# Patient Record
Sex: Male | Born: 1960 | Race: White | Hispanic: No | Marital: Single | Smoking: Current every day smoker
Health system: Southern US, Community
[De-identification: ages and names within clinical notes are randomized; demographics above are authoritative.]

## PROBLEM LIST (undated history)

## (undated) DIAGNOSIS — F419 Anxiety disorder, unspecified: Secondary | ICD-10-CM

## (undated) DIAGNOSIS — K219 Gastro-esophageal reflux disease without esophagitis: Secondary | ICD-10-CM

## (undated) DIAGNOSIS — M549 Dorsalgia, unspecified: Secondary | ICD-10-CM

---

## 2019-08-17 ENCOUNTER — Inpatient Hospital Stay (HOSPITAL_COMMUNITY)
Admission: EM | Admit: 2019-08-17 | Discharge: 2019-09-06 | DRG: 871 | Disposition: A | Discharge status: Home Health | Payer: Medicaid Other | Attending: Internal Medicine | Admitting: Internal Medicine

## 2019-08-17 ENCOUNTER — Emergency Department (HOSPITAL_COMMUNITY): Payer: Medicaid Other

## 2019-08-17 ENCOUNTER — Encounter (HOSPITAL_COMMUNITY): Payer: Self-pay | Admitting: Radiology

## 2019-08-17 DIAGNOSIS — E871 Hypo-osmolality and hyponatremia: Secondary | ICD-10-CM | POA: Diagnosis not present

## 2019-08-17 DIAGNOSIS — G8929 Other chronic pain: Secondary | ICD-10-CM | POA: Diagnosis present

## 2019-08-17 DIAGNOSIS — I10 Essential (primary) hypertension: Secondary | ICD-10-CM | POA: Diagnosis present

## 2019-08-17 DIAGNOSIS — R0602 Shortness of breath: Secondary | ICD-10-CM | POA: Diagnosis present

## 2019-08-17 DIAGNOSIS — Z20828 Contact with and (suspected) exposure to other viral communicable diseases: Secondary | ICD-10-CM | POA: Diagnosis present

## 2019-08-17 DIAGNOSIS — R188 Other ascites: Secondary | ICD-10-CM | POA: Diagnosis not present

## 2019-08-17 DIAGNOSIS — R161 Splenomegaly, not elsewhere classified: Secondary | ICD-10-CM | POA: Diagnosis not present

## 2019-08-17 DIAGNOSIS — R591 Generalized enlarged lymph nodes: Secondary | ICD-10-CM

## 2019-08-17 DIAGNOSIS — C9110 Chronic lymphocytic leukemia of B-cell type not having achieved remission: Secondary | ICD-10-CM | POA: Diagnosis present

## 2019-08-17 DIAGNOSIS — R579 Shock, unspecified: Secondary | ICD-10-CM

## 2019-08-17 DIAGNOSIS — L8990 Pressure ulcer of unspecified site, unspecified stage: Secondary | ICD-10-CM | POA: Insufficient documentation

## 2019-08-17 DIAGNOSIS — J189 Pneumonia, unspecified organism: Secondary | ICD-10-CM | POA: Diagnosis present

## 2019-08-17 DIAGNOSIS — D479 Neoplasm of uncertain behavior of lymphoid, hematopoietic and related tissue, unspecified: Secondary | ICD-10-CM | POA: Diagnosis not present

## 2019-08-17 DIAGNOSIS — R17 Unspecified jaundice: Secondary | ICD-10-CM | POA: Diagnosis present

## 2019-08-17 DIAGNOSIS — R7989 Other specified abnormal findings of blood chemistry: Secondary | ICD-10-CM | POA: Diagnosis not present

## 2019-08-17 DIAGNOSIS — R21 Rash and other nonspecific skin eruption: Secondary | ICD-10-CM | POA: Diagnosis not present

## 2019-08-17 DIAGNOSIS — C8308 Small cell B-cell lymphoma, lymph nodes of multiple sites: Secondary | ICD-10-CM

## 2019-08-17 DIAGNOSIS — E861 Hypovolemia: Secondary | ICD-10-CM | POA: Diagnosis present

## 2019-08-17 DIAGNOSIS — R6520 Severe sepsis without septic shock: Secondary | ICD-10-CM | POA: Diagnosis not present

## 2019-08-17 DIAGNOSIS — R590 Localized enlarged lymph nodes: Secondary | ICD-10-CM | POA: Diagnosis present

## 2019-08-17 DIAGNOSIS — R140 Abdominal distension (gaseous): Secondary | ICD-10-CM

## 2019-08-17 DIAGNOSIS — E43 Unspecified severe protein-calorie malnutrition: Secondary | ICD-10-CM | POA: Insufficient documentation

## 2019-08-17 DIAGNOSIS — F17210 Nicotine dependence, cigarettes, uncomplicated: Secondary | ICD-10-CM | POA: Diagnosis present

## 2019-08-17 DIAGNOSIS — R5381 Other malaise: Secondary | ICD-10-CM | POA: Diagnosis present

## 2019-08-17 DIAGNOSIS — E872 Acidosis: Secondary | ICD-10-CM | POA: Diagnosis present

## 2019-08-17 DIAGNOSIS — D735 Infarction of spleen: Secondary | ICD-10-CM | POA: Diagnosis present

## 2019-08-17 DIAGNOSIS — T782XXA Anaphylactic shock, unspecified, initial encounter: Secondary | ICD-10-CM | POA: Diagnosis not present

## 2019-08-17 DIAGNOSIS — R0600 Dyspnea, unspecified: Secondary | ICD-10-CM | POA: Diagnosis not present

## 2019-08-17 DIAGNOSIS — M549 Dorsalgia, unspecified: Secondary | ICD-10-CM | POA: Diagnosis present

## 2019-08-17 DIAGNOSIS — J90 Pleural effusion, not elsewhere classified: Secondary | ICD-10-CM

## 2019-08-17 DIAGNOSIS — R61 Generalized hyperhidrosis: Secondary | ICD-10-CM | POA: Diagnosis present

## 2019-08-17 DIAGNOSIS — E8809 Other disorders of plasma-protein metabolism, not elsewhere classified: Secondary | ICD-10-CM | POA: Diagnosis present

## 2019-08-17 DIAGNOSIS — J918 Pleural effusion in other conditions classified elsewhere: Secondary | ICD-10-CM | POA: Diagnosis present

## 2019-08-17 DIAGNOSIS — D630 Anemia in neoplastic disease: Secondary | ICD-10-CM | POA: Diagnosis present

## 2019-08-17 DIAGNOSIS — D61818 Other pancytopenia: Secondary | ICD-10-CM | POA: Diagnosis not present

## 2019-08-17 DIAGNOSIS — C8598 Non-Hodgkin lymphoma, unspecified, lymph nodes of multiple sites: Secondary | ICD-10-CM

## 2019-08-17 DIAGNOSIS — K219 Gastro-esophageal reflux disease without esophagitis: Secondary | ICD-10-CM | POA: Diagnosis present

## 2019-08-17 DIAGNOSIS — Z9889 Other specified postprocedural states: Secondary | ICD-10-CM

## 2019-08-17 DIAGNOSIS — J9601 Acute respiratory failure with hypoxia: Secondary | ICD-10-CM | POA: Diagnosis not present

## 2019-08-17 DIAGNOSIS — R945 Abnormal results of liver function studies: Secondary | ICD-10-CM | POA: Diagnosis present

## 2019-08-17 DIAGNOSIS — C8512 Unspecified B-cell lymphoma, intrathoracic lymph nodes: Secondary | ICD-10-CM | POA: Diagnosis present

## 2019-08-17 DIAGNOSIS — J9811 Atelectasis: Secondary | ICD-10-CM | POA: Diagnosis present

## 2019-08-17 DIAGNOSIS — R000 Tachycardia, unspecified: Secondary | ICD-10-CM | POA: Diagnosis present

## 2019-08-17 DIAGNOSIS — Z79899 Other long term (current) drug therapy: Secondary | ICD-10-CM

## 2019-08-17 DIAGNOSIS — E0781 Sick-euthyroid syndrome: Secondary | ICD-10-CM | POA: Diagnosis not present

## 2019-08-17 DIAGNOSIS — R079 Chest pain, unspecified: Secondary | ICD-10-CM

## 2019-08-17 DIAGNOSIS — R162 Hepatomegaly with splenomegaly, not elsewhere classified: Secondary | ICD-10-CM | POA: Diagnosis present

## 2019-08-17 DIAGNOSIS — C801 Malignant (primary) neoplasm, unspecified: Secondary | ICD-10-CM | POA: Diagnosis not present

## 2019-08-17 DIAGNOSIS — A419 Sepsis, unspecified organism: Secondary | ICD-10-CM | POA: Diagnosis not present

## 2019-08-17 DIAGNOSIS — Y92230 Patient room in hospital as the place of occurrence of the external cause: Secondary | ICD-10-CM | POA: Diagnosis not present

## 2019-08-17 DIAGNOSIS — E860 Dehydration: Secondary | ICD-10-CM | POA: Diagnosis present

## 2019-08-17 DIAGNOSIS — F419 Anxiety disorder, unspecified: Secondary | ICD-10-CM | POA: Diagnosis present

## 2019-08-17 DIAGNOSIS — Z807 Family history of other malignant neoplasms of lymphoid, hematopoietic and related tissues: Secondary | ICD-10-CM

## 2019-08-17 DIAGNOSIS — D500 Iron deficiency anemia secondary to blood loss (chronic): Secondary | ICD-10-CM | POA: Diagnosis present

## 2019-08-17 DIAGNOSIS — Z6821 Body mass index (BMI) 21.0-21.9, adult: Secondary | ICD-10-CM

## 2019-08-17 DIAGNOSIS — R64 Cachexia: Secondary | ICD-10-CM | POA: Diagnosis present

## 2019-08-17 DIAGNOSIS — T451X5A Adverse effect of antineoplastic and immunosuppressive drugs, initial encounter: Secondary | ICD-10-CM | POA: Diagnosis not present

## 2019-08-17 DIAGNOSIS — D649 Anemia, unspecified: Secondary | ICD-10-CM

## 2019-08-17 DIAGNOSIS — E876 Hypokalemia: Secondary | ICD-10-CM | POA: Diagnosis not present

## 2019-08-17 DIAGNOSIS — R109 Unspecified abdominal pain: Secondary | ICD-10-CM

## 2019-08-17 DIAGNOSIS — C8590 Non-Hodgkin lymphoma, unspecified, unspecified site: Secondary | ICD-10-CM

## 2019-08-17 DIAGNOSIS — Z590 Homelessness: Secondary | ICD-10-CM

## 2019-08-17 HISTORY — DX: Anxiety disorder, unspecified: F41.9

## 2019-08-17 HISTORY — DX: Gastro-esophageal reflux disease without esophagitis: K21.9

## 2019-08-17 HISTORY — DX: Dorsalgia, unspecified: M54.9

## 2019-08-17 LAB — CBC WITH DIFFERENTIAL/PLATELET
Abs Immature Granulocytes: 0.03 10*3/uL (ref 0.00–0.07)
Basophils Absolute: 0.1 10*3/uL (ref 0.0–0.1)
Basophils Relative: 2 %
Eosinophils Absolute: 0 10*3/uL (ref 0.0–0.5)
Eosinophils Relative: 0 %
HCT: 14.3 % — ABNORMAL LOW (ref 39.0–52.0)
Hemoglobin: 4.6 g/dL — CL (ref 13.0–17.0)
Immature Granulocytes: 1 %
Lymphocytes Relative: 64 %
Lymphs Abs: 2.2 10*3/uL (ref 0.7–4.0)
MCH: 27.4 pg (ref 26.0–34.0)
MCHC: 32.2 g/dL (ref 30.0–36.0)
MCV: 85.1 fL (ref 80.0–100.0)
Monocytes Absolute: 0.3 10*3/uL (ref 0.1–1.0)
Monocytes Relative: 8 %
Neutro Abs: 0.9 10*3/uL — ABNORMAL LOW (ref 1.7–7.7)
Neutrophils Relative %: 25 %
Platelets: 39 10*3/uL — ABNORMAL LOW (ref 150–400)
RBC: 1.68 MIL/uL — ABNORMAL LOW (ref 4.22–5.81)
RDW: 26.6 % — ABNORMAL HIGH (ref 11.5–15.5)
WBC: 3.4 10*3/uL — ABNORMAL LOW (ref 4.0–10.5)
nRBC: 0 % (ref 0.0–0.2)

## 2019-08-17 LAB — BASIC METABOLIC PANEL
Anion gap: 15 (ref 5–15)
BUN: 10 mg/dL (ref 6–20)
CO2: 19 mmol/L — ABNORMAL LOW (ref 22–32)
Calcium: 8.6 mg/dL — ABNORMAL LOW (ref 8.9–10.3)
Chloride: 92 mmol/L — ABNORMAL LOW (ref 98–111)
Creatinine, Ser: 0.52 mg/dL — ABNORMAL LOW (ref 0.61–1.24)
GFR calc Af Amer: >60 mL/min (ref >60)
GFR calc non Af Amer: >60 mL/min (ref >60)
Glucose, Bld: 78 mg/dL (ref 70–99)
Potassium: 4.1 mmol/L (ref 3.5–5.1)
Sodium: 126 mmol/L — ABNORMAL LOW (ref 135–145)

## 2019-08-17 LAB — URINALYSIS, ROUTINE W REFLEX MICROSCOPIC
Bilirubin Urine: NEGATIVE
Glucose, UA: NEGATIVE mg/dL
Hgb urine dipstick: NEGATIVE
Ketones, ur: NEGATIVE mg/dL
Leukocytes,Ua: NEGATIVE
Nitrite: NEGATIVE
Protein, ur: NEGATIVE mg/dL
Specific Gravity, Urine: 1.016 (ref 1.005–1.030)
pH: 5 (ref 5.0–8.0)

## 2019-08-17 LAB — BRAIN NATRIURETIC PEPTIDE: B Natriuretic Peptide: 93.7 pg/mL (ref 0.0–100.0)

## 2019-08-17 LAB — TROPONIN I (HIGH SENSITIVITY)
Troponin I (High Sensitivity): 22 ng/L — ABNORMAL HIGH (ref <18)
Troponin I (High Sensitivity): 36 ng/L — ABNORMAL HIGH (ref <18)

## 2019-08-17 LAB — LACTIC ACID, PLASMA
Lactic Acid, Venous: 6.4 mmol/L (ref 0.5–1.9)
Lactic Acid, Venous: 6.8 mmol/L (ref 0.5–1.9)

## 2019-08-17 LAB — PREPARE RBC (CROSSMATCH)

## 2019-08-17 LAB — POC OCCULT BLOOD, ED: Fecal Occult Bld: NEGATIVE

## 2019-08-17 LAB — D-DIMER, QUANTITATIVE: D-Dimer, Quant: 5.16 ug/mL-FEU — ABNORMAL HIGH (ref 0.00–0.50)

## 2019-08-17 MED ORDER — SODIUM CHLORIDE 0.9 % IV BOLUS (SEPSIS)
1000.0000 mL | Freq: Once | INTRAVENOUS | Status: AC
  Administered 2019-08-17: 1000 mL via INTRAVENOUS

## 2019-08-17 MED ORDER — PIPERACILLIN-TAZOBACTAM 3.375 G IVPB
3.3750 g | Freq: Three times a day (TID) | INTRAVENOUS | Status: AC
  Administered 2019-08-18 (×2): 3.375 g via INTRAVENOUS
  Filled 2019-08-17 (×2): qty 50

## 2019-08-17 MED ORDER — IOHEXOL 350 MG/ML SOLN
100.0000 mL | Freq: Once | INTRAVENOUS | Status: AC | PRN
  Administered 2019-08-17: 100 mL via INTRAVENOUS

## 2019-08-17 MED ORDER — PANTOPRAZOLE SODIUM 20 MG PO TBEC
20.0000 mg | DELAYED_RELEASE_TABLET | Freq: Every day | ORAL | Status: DC
  Administered 2019-08-18 – 2019-09-06 (×20): 20 mg via ORAL
  Filled 2019-08-17 (×20): qty 1

## 2019-08-17 MED ORDER — PIPERACILLIN-TAZOBACTAM 3.375 G IVPB 30 MIN
3.3750 g | Freq: Once | INTRAVENOUS | Status: AC
  Administered 2019-08-18: 3.375 g via INTRAVENOUS
  Filled 2019-08-17: qty 50

## 2019-08-17 MED ORDER — NICOTINE 21 MG/24HR TD PT24: 21.0000 mg | MEDICATED_PATCH | Freq: Every day | TRANSDERMAL | Status: DC

## 2019-08-17 MED ORDER — VANCOMYCIN HCL 10 G IV SOLR
1500.0000 mg | Freq: Once | INTRAVENOUS | Status: AC
  Administered 2019-08-18: 1500 mg via INTRAVENOUS
  Filled 2019-08-17 (×2): qty 1500

## 2019-08-17 MED ORDER — ACETAMINOPHEN 650 MG RE SUPP: 650.0000 mg | Freq: Four times a day (QID) | RECTAL | Status: DC | PRN

## 2019-08-17 MED ORDER — ACETAMINOPHEN 325 MG PO TABS
650.0000 mg | ORAL_TABLET | Freq: Four times a day (QID) | ORAL | Status: DC | PRN
  Administered 2019-08-18 – 2019-09-05 (×15): 650 mg via ORAL
  Filled 2019-08-17 (×16): qty 2

## 2019-08-17 MED ORDER — SODIUM CHLORIDE (PF) 0.9 % IJ SOLN
INTRAMUSCULAR | Status: AC
  Filled 2019-08-17: qty 50

## 2019-08-17 MED ORDER — SODIUM CHLORIDE 0.9 % IV BOLUS
500.0000 mL | Freq: Once | INTRAVENOUS | Status: AC
  Administered 2019-08-17: 500 mL via INTRAVENOUS

## 2019-08-17 MED ORDER — SODIUM CHLORIDE 0.9% IV SOLUTION
Freq: Once | INTRAVENOUS | Status: AC
  Administered 2019-08-17: 23:00:00 via INTRAVENOUS

## 2019-08-17 MED ORDER — NICOTINE 7 MG/24HR TD PT24
7.0000 mg | MEDICATED_PATCH | Freq: Every day | TRANSDERMAL | Status: DC
  Administered 2019-08-18 – 2019-09-06 (×20): 7 mg via TRANSDERMAL
  Filled 2019-08-17 (×21): qty 1

## 2019-08-17 MED ORDER — HYDROXYZINE HCL 10 MG PO TABS
10.0000 mg | ORAL_TABLET | Freq: Three times a day (TID) | ORAL | Status: DC | PRN
  Administered 2019-08-18 – 2019-08-30 (×18): 10 mg via ORAL
  Filled 2019-08-17 (×23): qty 1

## 2019-08-17 NOTE — ED Provider Notes (Signed)
[facility] DEPT Provider Note   CSN: [id number] Arrival date & time: 08/17/19  1754     History   Chief Complaint No chief complaint on file.   HPI Isaac Pierce is a 58 y.o. male with no documented PMHx who presents to the ED today complaining of gradual onset, constant, shortness of breath x 4-5 weeks.  Reports that prior to 4 to 5 weeks ago he felt well and was able to get up without difficulty and without feeling short of breath.  He does report that he was recently homeless until a couple of weeks ago.  He states that he has not been taking anything for his symptoms.  He denies any history of congestive heart failure but reports that EMS told him it appears that he is in congestive heart failure.  States that his sister is a [employer] scientist and was concerned for him and told him to call EMS for further evaluation.  Patient does not currently have a PCP.  Patient denies fever, chills, chest pain, cough, abdominal pain, nausea, vomiting, blood in stool, urinary symptoms, any other associated symptoms.      History reviewed. No pertinent past medical history.  Patient Active Problem List   Diagnosis Date Noted   Malignancy ([place]) 08/17/2019        Home Medications    Prior to Admission medications   Medication Sig Start Date End Date Taking? Authorizing Provider  hydrOXYzine (ATARAX/VISTARIL) 10 MG tablet Take 10 mg by mouth 3 (three) times daily as needed. 05/31/19  Yes [provider]  ibuprofen (ADVIL) 200 MG tablet Take 200 mg by mouth every 6 (six) hours as needed for moderate pain.   Yes [provider]  omeprazole (PRILOSEC) 20 MG capsule Take 20 mg by mouth daily. 07/23/19  Yes [provider]    Family History No family history on file.  Social History Social History   Tobacco Use   Smoking status: Not on file  Substance Use Topics   Alcohol use: Not on file   Drug use: Not on file     Allergies   Patient  has no known allergies.   Review of Systems Review of Systems  Constitutional: Positive for appetite change and fatigue. Negative for chills and fever.  HENT: Negative for congestion.   Respiratory: Positive for shortness of breath.   Cardiovascular: Positive for leg swelling. Negative for chest pain.  Gastrointestinal: Positive for abdominal distention. Negative for nausea and vomiting.  Genitourinary: Negative for difficulty urinating.  Musculoskeletal: Negative for myalgias.  Skin: Negative for rash.  Neurological: Negative for headaches.     Physical Exam Updated Vital Signs BP 107/70 (BP Location: Right Arm)    Pulse (!) 117    Temp 98.1 F (36.7 C)    Resp 16    SpO2 100%   Physical Exam Vitals signs and nursing note reviewed.  Constitutional:      Appearance: He is not ill-appearing or diaphoretic.  HENT:     Head: Normocephalic and atraumatic.  Eyes:     Conjunctiva/sclera: Conjunctivae normal.  Neck:     Musculoskeletal: Neck supple.  Cardiovascular:     Rate and Rhythm: Regular rhythm. Tachycardia present.     Pulses: Normal pulses.  Pulmonary:     Effort: Pulmonary effort is normal.     Breath sounds: Normal breath sounds. No wheezing, rhonchi or rales.     Comments: Slight increased work of breathing without accessory muscle use. Satting 100%  on RA Abdominal:     Palpations: Abdomen is soft.     Tenderness: There is no abdominal tenderness. There is no guarding or rebound.  Genitourinary:    Comments: Chaperone present for rectal exam. External hemorrhoids appreciated; non thrombosed. Good rectal tone. Guaiac negative.  Musculoskeletal:     Right lower leg: Edema present.     Left lower leg: Edema present.     Comments: 2+ pitting edema bilaterally   Skin:    General: Skin is warm and dry.  Neurological:     Mental Status: He is alert.      ED Treatments / Results  Labs (all labs ordered are listed, but only abnormal results are displayed) Labs  Reviewed  BASIC METABOLIC PANEL - Abnormal; Notable for the following components:      Result Value   Sodium 126 (*)    Chloride 92 (*)    CO2 19 (*)    Creatinine, Ser 0.52 (*)    Calcium 8.6 (*)    All other components within normal limits  CBC WITH DIFFERENTIAL/PLATELET - Abnormal; Notable for the following components:   WBC 3.4 (*)    RBC 1.68 (*)    Hemoglobin 4.6 (*)    HCT 14.3 (*)    RDW 26.6 (*)    Platelets 39 (*)    Neutro Abs 0.9 (*)    All other components within normal limits  LACTIC ACID, PLASMA - Abnormal; Notable for the following components:   Lactic Acid, Venous 6.4 (*)    All other components within normal limits  D-DIMER, QUANTITATIVE (NOT AT [facility]) - Abnormal; Notable for the following components:   D-Dimer, Quant 5.16 (*)    All other components within normal limits  TROPONIN I (HIGH SENSITIVITY) - Abnormal; Notable for the following components:   Troponin I (High Sensitivity) 22 (*)    All other components within normal limits  SARS CORONAVIRUS 2 (TAT 6-24 HRS)  CULTURE, BLOOD (ROUTINE X 2)  CULTURE, BLOOD (ROUTINE X 2)  BRAIN NATRIURETIC PEPTIDE  URINALYSIS, ROUTINE W REFLEX MICROSCOPIC  LACTIC ACID, PLASMA  PATHOLOGIST SMEAR REVIEW  OCCULT BLOOD X 1 CARD TO LAB, STOOL  POC OCCULT BLOOD, ED  TYPE AND SCREEN  PREPARE RBC (CROSSMATCH)  TROPONIN I (HIGH SENSITIVITY)    EKG None  Radiology Dg Chest 2 View  Result Date: 08/17/2019 CLINICAL DATA:  Shortness of breath EXAM: CHEST - 2 VIEW COMPARISON:  None. FINDINGS: Small bilateral pleural effusions. Mild basilar airspace disease. Borderline heart size with mild central vascular congestion. No pneumothorax. IMPRESSION: Mild central vascular congestion with small bilateral effusions. Atelectasis or mild pneumonia at the left greater than right lung base. Electronically Signed   By: Donavan Foil M.D.   On: 08/17/2019 19:57   Ct Angio Chest Pe W/cm &/or Wo Cm  Result Date: 08/17/2019 CLINICAL DATA:   Shortness of breath for 1 month EXAM: CT ANGIOGRAPHY CHEST WITH CONTRAST TECHNIQUE: Multidetector CT imaging of the chest was performed using the standard protocol during bolus administration of intravenous contrast. Multiplanar CT image reconstructions and MIPs were obtained to evaluate the vascular anatomy. CONTRAST:  133mL OMNIPAQUE IOHEXOL 350 MG/ML SOLN COMPARISON:  Chest x-ray 08/17/2019 FINDINGS: Cardiovascular: Suboptimal contrast bolus timing and respiratory motion artifact degraded examination of the more distal pulmonary arterial tree. No filling defect within the main or lobar branch pulmonary arteries. No evidence of right heart strain. Heart size is normal. There is pericardial thickening. Thoracic aorta is normal in course  and caliber. Mediastinum/Nodes: Bulky mediastinal, bilateral hilar, and bilateral axillary lymphadenopathy. Thyroid, trachea, and esophagus are grossly unremarkable. Lungs/Pleura: Small bilateral pleural effusions, left greater than right with associated compressive atelectasis. There are numerous bilateral noncalcified pulmonary nodules. Reference nodules include 10 mm right lower lobe juxtapleural nodule (series 6, image 88). 9 mm medial right lower lobe nodule (series 6, image 106). 7 mm anterior right middle lobe nodule (series 6, image 107). 7 mm left lower lobe nodule (series 6, image 90). 8 mm lingular nodule (series 6, image 92). No pneumothorax. Upper Abdomen: Hepatosplenomegaly within the visualized upper abdomen. Ill-defined area of low density within the peripheral aspect of the spleen measuring approximately 3.7 x 2.8 cm (series 4, image 135). Musculoskeletal: No chest wall abnormality. No acute or significant osseous findings. Review of the MIP images confirms the above findings. IMPRESSION: 1. Limited exam. No filling defect to the lobar branch level to suggest pulmonary embolism. 2. Bulky mediastinal, bilateral hilar, and bilateral axillary lymphadenopathy. Findings  concerning for primary lymphoproliferative process such as lymphoma. Metastatic disease secondary to unknown primary is also a consideration. 3. Multiple bilateral noncalcified pulmonary nodules measuring up to 10 mm, likely a result of the same process as listed above. 4. Small bilateral pleural effusions, left greater than right with associated compressive atelectasis. 5. Hepatosplenomegaly within the visualized upper abdomen with ill-defined area of low density within the peripheral aspect of the spleen measuring 3.7 x 2.8 cm. Findings are concerning for lymphomatous involvement versus metastatic disease. Alternatively, sequela of splenic injury could have a similar appearance. These results were called by telephone at the time of interpretation on 08/17/2019 at 8:49 pm to provider Graylee Arutyunyan , who verbally acknowledged these results. Electronically Signed   By: Davina Poke M.D.   On: 08/17/2019 20:51    Procedures .Critical Care Performed by: Eustaquio Maize, PA-C Authorized by: Eustaquio Maize, PA-C   Critical care provider statement:    Critical care time (minutes):  45   Critical care was time spent personally by me on the following activities:  Discussions with consultants, evaluation of patient's response to treatment, examination of patient, ordering and performing treatments and interventions, ordering and review of laboratory studies, ordering and review of radiographic studies, pulse oximetry, re-evaluation of patient's condition, obtaining history from patient or surrogate and review of old charts   (including critical care time)  Medications Ordered in ED Medications  sodium chloride (PF) 0.9 % injection (has no administration in time range)  0.9 %  sodium chloride infusion (Manually program via Guardrails IV Fluids) (has no administration in time range)  iohexol (OMNIPAQUE) 350 MG/ML injection 100 mL (100 mLs Intravenous Contrast Given 08/17/19 2026)  sodium chloride 0.9 %  bolus 1,000 mL (1,000 mLs Intravenous New Bag/Given 08/17/19 2106)     Initial Impression / Assessment and Plan / ED Course  I have reviewed the triage vital signs and the nursing notes.  Pertinent labs & imaging results that were available during my care of the patient were reviewed by me and considered in my medical decision making (see chart for details).  58 year old male who presents to the ED complaining of shortness of breath for several weeks.  Patient does appear to be getting slightly more to breathe although his lungs are clear to auscultation bilaterally.  He is satting 100% on room air.  Patient is noted to be significantly tachycardic on exam in the 1 teens.  He is denying any chest pain at this time.  He is  afebrile.  Doubt infection today although will work-up for this.  We will hold off on antibiotics at this time.  Concerned for congestive heart failure exacerbation versus PE.  Obtain D-dimer as well as BNP, chest x-ray, troponin, screening labs at this time.   D-dimer significantly elevated at 5.16.  Will proceed with CTA.   CBC with pancytopenia; blood cell count 3.4, red blood cell count 1.68, hemoglobin 4.6, hematocrit 14.3, platelets 39,000.  Will transfuse at this time.  Fecal occult blood test - negative.   Lactic acid elevated at 6.4; will give 1 L NS bolus. Again doubt infectious etiology; cxr without infection as well as urinalysis. Pt's only complaint is shortness of breath and WBC not elevated; suspect elevated due to significant hypovolemia/inability to profuse organs adequately. Do not want to overload patient with fluids given bilateral pleural effusions and complaint of SOB.   CTA with concern for lymphoma to multiple sites; consistent with pt's pancytopenia today. Will consult oncology for further reccs as well as hospitalist for admission.   Clinical Course as of Aug 17 2135  Tue Aug 17, 2019  2050 Received call from radiologist - no obvious PE. Wide spread  lymphoma. Hepatosplenomegaly. Low density lesion in spleen. Pulmonary nodules bilaterally. B/l pleural effusions.    [MV]  2135 Discussed case with Dr. Julien Nordmann with oncology who will see patient in the morning.    [MV]  2136 Discussed case with hospitalist Dr. Marlowe Sax who agrees to see patient for admission.    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

## 2019-08-17 NOTE — H&P (Signed)
History and Physical    Isaac Pierce [id number] DOB: 09-19-1961 DOA: 08/17/2019  PCP: Patient, No Pcp Per Patient coming from: Home  Chief Complaint: Shortness of breath  HPI: Isaac Pierce is a 58 y.o. male with a past medical history of chronic back pain, GERD, anxiety, homelessness presenting with a chief complaint of shortness of breath.  Patient states he returned here from [place] about 5 weeks ago and since then has been doing very poorly.  He has no energy and stays in bed all the time.  He has no appetite.  He is feeling short of breath even at rest and his breathing has been getting progressively worse.  He sometimes has a cough productive of white sputum.  He has lost a significant amount of weight.  His legs have been very swollen and his abdomen is also swollen.  He has noticed lumps underneath his axilla and on both sides of his neck.  He wakes up at night drenched in sweat.  States both his parents died from [place].  Denies personal history of malignancy.  He was previously smoking 1 to 2 packs of cigarettes per day but since he has been sick is now only smoking 1 to 4 cigarettes/day.  ED Course: Tachycardic with heart rate in the 110s to 120s.  Tachypneic with respiratory rate in the 20s.  Blood pressure intermittently soft and patient received 1 L normal saline bolus.  Oxygen saturation 96-100% on room air.  Placed on 2 L supplemental oxygen via nasal cannula for increased work of breathing.  Labs showing pancytopenia (WBC count 3.4, hemoglobin 4.6, platelet count 39,000).  FOBT negative.  Other lab abnormalities seen:  Lactic acid 6.4.  Sodium 126.  Bicarb 19, anion gap 15.  BNP normal.  D-dimer elevated at 5.16.  High-sensitivity troponin mildly elevated at 22.  EKG not suggestive of ACS. Pending labs: Pathologist smear review, blood culture x2, SARS-CoV-2 test Chest x-ray showing mild central vascular congestion with small bilateral effusions.  Atelectasis or mild pneumonia  at the left greater than right lung base. Chest CT angiogram (limited exam) negative for PE.  Showing bulky mediastinal, bilateral hilar, and bilateral axillary lymphadenopathy.  Findings concerning for primary lymphoproliferative process such as lymphoma.  Metastatic disease secondary to unknown primary is also a consideration.  Multiple bilateral noncalcified pulmonary nodules measuring up to 10 mm.  Small bilateral pleural effusions, left greater than right with associated compressive atelectasis.  Hepatosplenomegaly within the visualized upper abdomen with ill-defined area of low density within the peripheral aspect of the spleen measuring 3.7 x 2.8 cm, findings concerning for lymphomatous involvement versus metastatic disease versus possible sequela of splenic injury. 2 units PRBCs ordered. ED provider discussed the case with Dr. Julien Nordmann from oncology, will consult in a.m.  Review of Systems:  All systems reviewed and apart from history of presenting illness, are negative.  Past Medical History:  Diagnosis Date   Anxiety    Back pain    GERD (gastroesophageal reflux disease)     History reviewed. No pertinent surgical history.   reports that he has been smoking cigarettes. He has never used smokeless tobacco. He reports previous alcohol use. He reports previous drug use.  No Known Allergies  Family History  Problem Relation Age of Onset   Lymphoma Mother    Lymphoma Father     Prior to Admission medications   Not on File    Physical Exam: Vitals:   08/17/19 2226 08/17/19 2232 08/17/19 2249 08/17/19  2307  BP: (!) 93/46 (!) 96/38 (!) 101/45 (!) 114/48  Pulse: (!) 125  (!) 123 (!) 123  Resp: 19  (!) 26 (!) 24  Temp:   98.1 F (36.7 C) 98.2 F (36.8 C)  TempSrc:   Oral Oral  SpO2: 97%  100% 99%  Weight:        Physical Exam  Constitutional: He is oriented to person, place, and time. No distress.  Cachectic  HENT:  Head: Normocephalic.  Dry mucous membranes    Eyes: Right eye exhibits no discharge. Left eye exhibits no discharge.  Neck: Neck supple.  Bilateral submental and cervical lymphadenopathy  Cardiovascular: Regular rhythm and intact distal pulses.  Tachycardic  Pulmonary/Chest: Effort normal and breath sounds normal. No respiratory distress. He has no wheezes. He has no rales.  Abdominal: Soft. Bowel sounds are normal. He exhibits distension and mass. There is no abdominal tenderness. There is no guarding.  Musculoskeletal:        General: Edema present.     Comments: Significant +3 pitting edema of bilateral lower extremities Bilateral axillary lymphadenopathy  Neurological: He is alert and oriented to person, place, and time.  Skin: Skin is warm and dry. He is not diaphoretic.     Labs on Admission: I have personally reviewed following labs and imaging studies  CBC: Recent Labs  Lab 08/17/19 1914  WBC 3.4*  NEUTROABS 0.9*  HGB 4.6*  HCT 14.3*  MCV 85.1  PLT 39*   Basic Metabolic Panel: Recent Labs  Lab 08/17/19 1914  NA 126*  K 4.1  CL 92*  CO2 19*  GLUCOSE 78  BUN 10  CREATININE 0.52*  CALCIUM 8.6*   GFR: CrCl cannot be calculated (Unknown ideal weight.). Liver Function Tests: No results for input(s): AST, ALT, ALKPHOS, BILITOT, PROT, ALBUMIN in the last 168 hours. No results for input(s): LIPASE, AMYLASE in the last 168 hours. No results for input(s): AMMONIA in the last 168 hours. Coagulation Profile: No results for input(s): INR, PROTIME in the last 168 hours. Cardiac Enzymes: No results for input(s): CKTOTAL, CKMB, CKMBINDEX, TROPONINI in the last 168 hours. BNP (last 3 results) No results for input(s): PROBNP in the last 8760 hours. HbA1C: No results for input(s): HGBA1C in the last 72 hours. CBG: No results for input(s): GLUCAP in the last 168 hours. Lipid Profile: No results for input(s): CHOL, HDL, LDLCALC, TRIG, CHOLHDL, LDLDIRECT in the last 72 hours. Thyroid Function Tests: No results for  input(s): TSH, T4TOTAL, FREET4, T3FREE, THYROIDAB in the last 72 hours. Anemia Panel: No results for input(s): VITAMINB12, FOLATE, FERRITIN, TIBC, IRON, RETICCTPCT in the last 72 hours. Urine analysis:    Component Value Date/Time   COLORURINE YELLOW 08/17/2019 1914   APPEARANCEUR CLEAR 08/17/2019 1914   LABSPEC 1.016 08/17/2019 1914   PHURINE 5.0 08/17/2019 1914   GLUCOSEU NEGATIVE 08/17/2019 [place] NEGATIVE 08/17/2019 [place] NEGATIVE 08/17/2019 [place] NEGATIVE 08/17/2019 1914   PROTEINUR NEGATIVE 08/17/2019 1914   NITRITE NEGATIVE 08/17/2019 1914   LEUKOCYTESUR NEGATIVE 08/17/2019 1914    Radiological Exams on Admission: Dg Chest 2 View  Result Date: 08/17/2019 CLINICAL DATA:  Shortness of breath EXAM: CHEST - 2 VIEW COMPARISON:  None. FINDINGS: Small bilateral pleural effusions. Mild basilar airspace disease. Borderline heart size with mild central vascular congestion. No pneumothorax. IMPRESSION: Mild central vascular congestion with small bilateral effusions. Atelectasis or mild pneumonia at the left greater than right lung base. Electronically Signed  By: Donavan Foil M.D.   On: 08/17/2019 19:57   Ct Angio Chest Pe W/cm &/or Wo Cm  Result Date: 08/17/2019 CLINICAL DATA:  Shortness of breath for 1 month EXAM: CT ANGIOGRAPHY CHEST WITH CONTRAST TECHNIQUE: Multidetector CT imaging of the chest was performed using the standard protocol during bolus administration of intravenous contrast. Multiplanar CT image reconstructions and MIPs were obtained to evaluate the vascular anatomy. CONTRAST:  170mL OMNIPAQUE IOHEXOL 350 MG/ML SOLN COMPARISON:  Chest x-ray 08/17/2019 FINDINGS: Cardiovascular: Suboptimal contrast bolus timing and respiratory motion artifact degraded examination of the more distal pulmonary arterial tree. No filling defect within the main or lobar branch pulmonary arteries. No evidence of right heart strain. Heart size is normal. There is  pericardial thickening. Thoracic aorta is normal in course and caliber. Mediastinum/Nodes: Bulky mediastinal, bilateral hilar, and bilateral axillary lymphadenopathy. Thyroid, trachea, and esophagus are grossly unremarkable. Lungs/Pleura: Small bilateral pleural effusions, left greater than right with associated compressive atelectasis. There are numerous bilateral noncalcified pulmonary nodules. Reference nodules include 10 mm right lower lobe juxtapleural nodule (series 6, image 88). 9 mm medial right lower lobe nodule (series 6, image 106). 7 mm anterior right middle lobe nodule (series 6, image 107). 7 mm left lower lobe nodule (series 6, image 90). 8 mm lingular nodule (series 6, image 92). No pneumothorax. Upper Abdomen: Hepatosplenomegaly within the visualized upper abdomen. Ill-defined area of low density within the peripheral aspect of the spleen measuring approximately 3.7 x 2.8 cm (series 4, image 135). Musculoskeletal: No chest wall abnormality. No acute or significant osseous findings. Review of the MIP images confirms the above findings. IMPRESSION: 1. Limited exam. No filling defect to the lobar branch level to suggest pulmonary embolism. 2. Bulky mediastinal, bilateral hilar, and bilateral axillary lymphadenopathy. Findings concerning for primary lymphoproliferative process such as lymphoma. Metastatic disease secondary to unknown primary is also a consideration. 3. Multiple bilateral noncalcified pulmonary nodules measuring up to 10 mm, likely a result of the same process as listed above. 4. Small bilateral pleural effusions, left greater than right with associated compressive atelectasis. 5. Hepatosplenomegaly within the visualized upper abdomen with ill-defined area of low density within the peripheral aspect of the spleen measuring 3.7 x 2.8 cm. Findings are concerning for lymphomatous involvement versus metastatic disease. Alternatively, sequela of splenic injury could have a similar appearance.  These results were called by telephone at the time of interpretation on 08/17/2019 at 8:49 pm to provider MARGAUX VENTER , who verbally acknowledged these results. Electronically Signed   By: Davina Poke M.D.   On: 08/17/2019 20:51    EKG: Independently reviewed.  Sinus tachycardia, heart rate 121.  Assessment/Plan Principal Problem:   Severe sepsis (HCC) Active Problems:   Malignancy (HCC)   Pancytopenia (HCC)   Dyspnea   Pleural effusion   Hyponatremia   Lactic acidosis/ concern for severe sepsis from unknown source Lactic acid significantly elevated at 6.4.  Although this could be related to underlying malignancy, there remains concern for severe sepsis given tachycardia, tachypnea, and soft blood pressure readings.  No fever.  Does have lymphopenia.  Chest CT not suggestive of pneumonia.  UA not suggestive of infection. -Received 1 L normal saline bolus.  Continue to monitor blood pressure closely and give additional fluid boluses to keep MAP >65. -Give broad-spectrum antibiotics including vancomycin and Zosyn -Check procalcitonin level -Trend lactate -Blood culture x2 pending -Urine culture -SARS-CoV-2 test pending  Concern for malignancy/ lymphoproliferative disorder Patient is presenting with a 5-week history of  dyspnea, night sweats, anorexia, significant weight loss, and diffuse lymphadenopathy. Chest CT angiogram showing bulky mediastinal, bilateral hilar, and bilateral axillary lymphadenopathy.  Findings concerning for primary lymphoproliferative process such as lymphoma.  Metastatic disease secondary to unknown primary is also consideration.  Also showing multiple bilateral noncalcified pulmonary nodules measuring up to 10 mm.  Hepatosplenomegaly within the visualized upper abdomen with ill-defined area of low density within the peripheral aspect of the spleen measuring 3.7 x 2.8 cm, findings concerning for lymphomatous involvement versus metastatic disease versus possible  sequela of splenic injury. -ED provider discussed the case with Dr. Julien Nordmann from oncology, will consult in a.m. -CT abdomen pelvis for further staging  Pancytopenia (leukopenia, severe anemia, and severe thrombocytopenia) Suspect related to malignancy.  WBC count 3.4, hemoglobin 4.6, platelet count 39,000.  Blood loss anemia from acute GI bleed less likely given negative FOBT. -Oncology will consult in a.m. -Pathologist smear review -Continue to monitor CBC -Anemia panel -2 units PRBCs for symptomatic anemia -Avoid anticoagulation for DVT prophylaxis  Dyspnea secondary to suspected malignant pleural effusions and compressive atelectasis CT showing small bilateral pleural effusions, left greater than right with associated compressive atelectasis.  Although patient does have significant peripheral edema, no overt pulmonary edema reported on CT.  BNP normal.  Not hypoxic.  Placed on 2 L supplemental oxygen for increased work of breathing. -Avoid giving Lasix at this time given soft blood pressure readings.  Systolic is currently in the 90s. -Continue supplemental oxygen to keep oxygen saturation above 94% -Continuous pulse ox  Hypovolemic hyponatremia Patient appears dehydrated.  Endorses decreased p.o. intake for over a month.  Sodium 126. -IV fluid hydration -Continue to monitor BMP -Check serum osmolarity  Normal anion gap metabolic acidosis Bicarb 19, anion gap 15.  Patient is not on a diuretic and not endorsing diarrhea. -IV fluid hydration -Continue to monitor BMP  Bilateral lower extremity edema/ concern for DVT Patient has significant bilateral lower extremity edema.  D-dimer elevated at 5.16.  CT angiogram negative for PE.  There remains concern for DVT given imaging findings consistent with malignancy. -Bilateral lower extremity Dopplers.  If positive for DVT, will likely need IVC filter as patient will be a poor candidate for anticoagulation given severe  thrombocytopenia.  Troponinemia High-sensitivity troponin mildly elevated at 22.  EKG not suggestive of ACS.  Patient is not complaining of chest pain and appears comfortable. -Cardiac monitoring -Trend troponin  Tobacco use -NicoDerm patch -Counseling  Anxiety -Continue home hydroxyzine  Chronic back pain -Tylenol as needed  GERD -Continue PPI  Homelessness -Social work consult  HIV screening The patient falls between the ages of 13-64 and should be screened for HIV, therefore HIV testing ordered.  DVT prophylaxis: SCDs given thrombocytopenia Code Status: Full code Family Communication: No family available. Disposition Plan: Anticipate discharge after clinical improvement. Consults called: Oncology Admission status: It is my clinical opinion that admission to INPATIENT is reasonable and necessary in this 58 y.o. male  presenting dyspnea/increased work of breathing requiring supplemental oxygen.  Imaging with findings concerning for lymphoproliferative disorder/malignancy.  There is also concern for severe sepsis.  Labs suggestive of pancytopenia.  Receiving broad-spectrum antibiotics, blood transfusion, and IV fluid.  Oncology will consult in a.m. high risk of decompensation.  Monitor closely in the stepdown unit.  Given the aforementioned, the predictability of an adverse outcome is felt to be significant. I expect that the patient will require at least 2 midnights in the hospital to treat this condition.   The medical decision  making on this patient was of high complexity and the patient is at high risk for clinical deterioration, therefore this is a level 3 visit.  Shela Leff MD [facility] Pager [phone]  If 7PM-7AM, please contact night-coverage [email or web address] Password [email or web address]  08/17/2019, 11:09 PM

## 2019-08-17 NOTE — ED Notes (Signed)
Patient given two sandwhiches, cheese sticks, and a sprite per request

## 2019-08-17 NOTE — ED Triage Notes (Signed)
Per EMS-states SOB for 1 month, LE edema/ascites for 1 week-weakness-patient has been homeless up until 1 month ago-JVD and pitting edema

## 2019-08-17 NOTE — ED Notes (Signed)
Patient placed on 2L oxygen per Dr. Stark Jock. Patient having difficulty breathing while speaking.

## 2019-08-18 ENCOUNTER — Inpatient Hospital Stay (HOSPITAL_COMMUNITY): Payer: Medicaid Other

## 2019-08-18 ENCOUNTER — Encounter (HOSPITAL_COMMUNITY): Payer: Self-pay | Admitting: Oncology

## 2019-08-18 ENCOUNTER — Other Ambulatory Visit: Payer: Self-pay

## 2019-08-18 DIAGNOSIS — R6520 Severe sepsis without septic shock: Secondary | ICD-10-CM

## 2019-08-18 DIAGNOSIS — A419 Sepsis, unspecified organism: Principal | ICD-10-CM

## 2019-08-18 DIAGNOSIS — R0600 Dyspnea, unspecified: Secondary | ICD-10-CM

## 2019-08-18 DIAGNOSIS — E871 Hypo-osmolality and hyponatremia: Secondary | ICD-10-CM

## 2019-08-18 DIAGNOSIS — R591 Generalized enlarged lymph nodes: Secondary | ICD-10-CM

## 2019-08-18 DIAGNOSIS — J90 Pleural effusion, not elsewhere classified: Secondary | ICD-10-CM

## 2019-08-18 DIAGNOSIS — R0602 Shortness of breath: Secondary | ICD-10-CM

## 2019-08-18 DIAGNOSIS — C801 Malignant (primary) neoplasm, unspecified: Secondary | ICD-10-CM

## 2019-08-18 DIAGNOSIS — R7989 Other specified abnormal findings of blood chemistry: Secondary | ICD-10-CM

## 2019-08-18 LAB — CBC
HCT: 21.3 % — ABNORMAL LOW (ref 39.0–52.0)
Hemoglobin: 7 g/dL — ABNORMAL LOW (ref 13.0–17.0)
MCH: 28 pg (ref 26.0–34.0)
MCHC: 32.9 g/dL (ref 30.0–36.0)
MCV: 85.2 fL (ref 80.0–100.0)
Platelets: 42 10*3/uL — ABNORMAL LOW (ref 150–400)
RBC: 2.5 MIL/uL — ABNORMAL LOW (ref 4.22–5.81)
RDW: 23.9 % — ABNORMAL HIGH (ref 11.5–15.5)
WBC: 3.8 10*3/uL — ABNORMAL LOW (ref 4.0–10.5)
nRBC: 0 % (ref 0.0–0.2)

## 2019-08-18 LAB — ABO/RH: ABO/RH(D): O POS

## 2019-08-18 LAB — COMPREHENSIVE METABOLIC PANEL
ALT: 25 U/L (ref 0–44)
AST: 67 U/L — ABNORMAL HIGH (ref 15–41)
Albumin: 2 g/dL — ABNORMAL LOW (ref 3.5–5.0)
Alkaline Phosphatase: 171 U/L — ABNORMAL HIGH (ref 38–126)
Anion gap: 11 (ref 5–15)
BUN: 11 mg/dL (ref 6–20)
CO2: 21 mmol/L — ABNORMAL LOW (ref 22–32)
Calcium: 8.6 mg/dL — ABNORMAL LOW (ref 8.9–10.3)
Chloride: 96 mmol/L — ABNORMAL LOW (ref 98–111)
Creatinine, Ser: 0.53 mg/dL — ABNORMAL LOW (ref 0.61–1.24)
GFR calc Af Amer: >60 mL/min (ref >60)
GFR calc non Af Amer: >60 mL/min (ref >60)
Glucose, Bld: 89 mg/dL (ref 70–99)
Potassium: 3.9 mmol/L (ref 3.5–5.1)
Sodium: 128 mmol/L — ABNORMAL LOW (ref 135–145)
Total Bilirubin: 1.4 mg/dL — ABNORMAL HIGH (ref 0.3–1.2)
Total Protein: 4.6 g/dL — ABNORMAL LOW (ref 6.5–8.1)

## 2019-08-18 LAB — ECHOCARDIOGRAM COMPLETE
Height: 71 in
Weight: 2553.81 oz

## 2019-08-18 LAB — PATHOLOGIST SMEAR REVIEW

## 2019-08-18 LAB — RAPID URINE DRUG SCREEN, HOSP PERFORMED
Amphetamines: NOT DETECTED
Barbiturates: NOT DETECTED
Benzodiazepines: NOT DETECTED
Cocaine: NOT DETECTED
Opiates: NOT DETECTED
Tetrahydrocannabinol: NOT DETECTED

## 2019-08-18 LAB — MRSA PCR SCREENING: MRSA by PCR: NEGATIVE

## 2019-08-18 LAB — RETICULOCYTES
Immature Retic Fract: 24.7 % — ABNORMAL HIGH (ref 2.3–15.9)
RBC.: 2.5 MIL/uL — ABNORMAL LOW (ref 4.22–5.81)
Retic Count, Absolute: 50 10*3/uL (ref 19.0–186.0)
Retic Ct Pct: 2 % (ref 0.4–3.1)

## 2019-08-18 LAB — LACTIC ACID, PLASMA
Lactic Acid, Venous: 6 mmol/L (ref 0.5–1.9)
Lactic Acid, Venous: 4.6 mmol/L (ref 0.5–1.9)
Lactic Acid, Venous: 3 mmol/L (ref 0.5–1.9)
Lactic Acid, Venous: 2.9 mmol/L (ref 0.5–1.9)

## 2019-08-18 LAB — PROTIME-INR
INR: 1.3 — ABNORMAL HIGH (ref 0.8–1.2)
Prothrombin Time: 15.7 seconds — ABNORMAL HIGH (ref 11.4–15.2)

## 2019-08-18 LAB — DIFFERENTIAL
Abs Immature Granulocytes: 0.05 10*3/uL (ref 0.00–0.07)
Basophils Absolute: 0 10*3/uL (ref 0.0–0.1)
Basophils Relative: 1 %
Eosinophils Absolute: 0 10*3/uL (ref 0.0–0.5)
Eosinophils Relative: 0 %
Immature Granulocytes: 1 %
Lymphocytes Relative: 53 %
Lymphs Abs: 2.1 10*3/uL (ref 0.7–4.0)
Monocytes Absolute: 0.4 10*3/uL (ref 0.1–1.0)
Monocytes Relative: 12 %
Neutro Abs: 1.3 10*3/uL — ABNORMAL LOW (ref 1.7–7.7)
Neutrophils Relative %: 33 %

## 2019-08-18 LAB — VITAMIN B12: Vitamin B-12: 1022 pg/mL — ABNORMAL HIGH (ref 180–914)

## 2019-08-18 LAB — MAGNESIUM: Magnesium: 1.5 mg/dL — ABNORMAL LOW (ref 1.7–2.4)

## 2019-08-18 LAB — PHOSPHORUS: Phosphorus: 3.4 mg/dL (ref 2.5–4.6)

## 2019-08-18 LAB — HEPATIC FUNCTION PANEL
ALT: 24 U/L (ref 0–44)
AST: 69 U/L — ABNORMAL HIGH (ref 15–41)
Albumin: 2 g/dL — ABNORMAL LOW (ref 3.5–5.0)
Alkaline Phosphatase: 160 U/L — ABNORMAL HIGH (ref 38–126)
Bilirubin, Direct: 0.5 mg/dL — ABNORMAL HIGH (ref 0.0–0.2)
Indirect Bilirubin: 0.9 mg/dL (ref 0.3–0.9)
Total Bilirubin: 1.4 mg/dL — ABNORMAL HIGH (ref 0.3–1.2)
Total Protein: 4.4 g/dL — ABNORMAL LOW (ref 6.5–8.1)

## 2019-08-18 LAB — IRON AND TIBC
Iron: 87 ug/dL (ref 45–182)
Saturation Ratios: 41 % — ABNORMAL HIGH (ref 17.9–39.5)
TIBC: 213 ug/dL — ABNORMAL LOW (ref 250–450)
UIBC: 126 ug/dL

## 2019-08-18 LAB — HEMOGLOBIN AND HEMATOCRIT, BLOOD
HCT: 22.5 % — ABNORMAL LOW (ref 39.0–52.0)
Hemoglobin: 7.5 g/dL — ABNORMAL LOW (ref 13.0–17.0)

## 2019-08-18 LAB — OSMOLALITY, URINE: Osmolality, Ur: 439 mOsm/kg (ref 300–900)

## 2019-08-18 LAB — CREATININE, URINE, RANDOM: Creatinine, Urine: 53.24 mg/dL

## 2019-08-18 LAB — TROPONIN I (HIGH SENSITIVITY)
Troponin I (High Sensitivity): 127 ng/L (ref <18)
Troponin I (High Sensitivity): 128 ng/L (ref <18)

## 2019-08-18 LAB — FOLATE: Folate: 5.4 ng/mL — ABNORMAL LOW (ref >5.9)

## 2019-08-18 LAB — LACTATE DEHYDROGENASE: LDH: 688 U/L — ABNORMAL HIGH (ref 98–192)

## 2019-08-18 LAB — PROCALCITONIN: Procalcitonin: 2.6 ng/mL

## 2019-08-18 LAB — PREPARE RBC (CROSSMATCH)

## 2019-08-18 LAB — SODIUM, URINE, RANDOM: Sodium, Ur: 74 mmol/L

## 2019-08-18 LAB — FERRITIN: Ferritin: 1444 ng/mL — ABNORMAL HIGH (ref 24–336)

## 2019-08-18 LAB — SARS CORONAVIRUS 2 (TAT 6-24 HRS): SARS Coronavirus 2: NEGATIVE

## 2019-08-18 LAB — BRAIN NATRIURETIC PEPTIDE: B Natriuretic Peptide: 91.8 pg/mL (ref 0.0–100.0)

## 2019-08-18 LAB — OSMOLALITY: Osmolality: 271 mOsm/kg — ABNORMAL LOW (ref 275–295)

## 2019-08-18 MED ORDER — LIDOCAINE-EPINEPHRINE (PF) 2 %-1:200000 IJ SOLN
INTRAMUSCULAR | Status: AC
  Filled 2019-08-18: qty 20

## 2019-08-18 MED ORDER — SODIUM CHLORIDE (PF) 0.9 % IJ SOLN
INTRAMUSCULAR | Status: AC
  Administered 2019-08-18: 14:00:00
  Filled 2019-08-18: qty 50

## 2019-08-18 MED ORDER — IOHEXOL 9 MG/ML PO SOLN
ORAL | Status: AC
  Filled 2019-08-18: qty 1000

## 2019-08-18 MED ORDER — IPRATROPIUM BROMIDE 0.02 % IN SOLN
0.5000 mg | Freq: Four times a day (QID) | RESPIRATORY_TRACT | Status: DC
  Administered 2019-08-18: 0.5 mg via RESPIRATORY_TRACT
  Filled 2019-08-18: qty 2.5

## 2019-08-18 MED ORDER — LEVALBUTEROL HCL 0.63 MG/3ML IN NEBU
0.6300 mg | INHALATION_SOLUTION | Freq: Four times a day (QID) | RESPIRATORY_TRACT | Status: DC
  Administered 2019-08-18: 0.63 mg via RESPIRATORY_TRACT
  Filled 2019-08-18: qty 3

## 2019-08-18 MED ORDER — FUROSEMIDE 10 MG/ML IJ SOLN
40.0000 mg | Freq: Once | INTRAMUSCULAR | Status: AC
  Administered 2019-08-18: 40 mg via INTRAVENOUS
  Filled 2019-08-18: qty 4

## 2019-08-18 MED ORDER — IPRATROPIUM BROMIDE 0.02 % IN SOLN
0.5000 mg | Freq: Two times a day (BID) | RESPIRATORY_TRACT | Status: DC
  Administered 2019-08-18 – 2019-08-28 (×20): 0.5 mg via RESPIRATORY_TRACT
  Filled 2019-08-18 (×20): qty 2.5

## 2019-08-18 MED ORDER — SODIUM CHLORIDE 0.9 % IV BOLUS: 500.0000 mL | Freq: Once | INTRAVENOUS | Status: DC

## 2019-08-18 MED ORDER — ENSURE ENLIVE PO LIQD
237.0000 mL | Freq: Two times a day (BID) | ORAL | Status: DC
  Administered 2019-08-18 – 2019-09-06 (×16): 237 mL via ORAL

## 2019-08-18 MED ORDER — FUROSEMIDE 10 MG/ML IJ SOLN
20.0000 mg | Freq: Once | INTRAMUSCULAR | Status: AC
  Administered 2019-08-18: 20 mg via INTRAVENOUS
  Filled 2019-08-18: qty 2

## 2019-08-18 MED ORDER — SODIUM CHLORIDE 0.9 % IV SOLN: INTRAVENOUS | Status: DC

## 2019-08-18 MED ORDER — ADULT MULTIVITAMIN W/MINERALS CH
1.0000 | ORAL_TABLET | Freq: Every day | ORAL | Status: DC
  Administered 2019-08-18 – 2019-09-06 (×20): 1 via ORAL
  Filled 2019-08-18 (×20): qty 1

## 2019-08-18 MED ORDER — ORAL CARE MOUTH RINSE
15.0000 mL | Freq: Two times a day (BID) | OROMUCOSAL | Status: DC
  Administered 2019-08-18 – 2019-09-06 (×26): 15 mL via OROMUCOSAL

## 2019-08-18 MED ORDER — FOLIC ACID 1 MG PO TABS
1.0000 mg | ORAL_TABLET | Freq: Every day | ORAL | Status: DC
  Administered 2019-08-18 – 2019-09-06 (×20): 1 mg via ORAL
  Filled 2019-08-18 (×20): qty 1

## 2019-08-18 MED ORDER — LEVALBUTEROL HCL 0.63 MG/3ML IN NEBU
0.6300 mg | INHALATION_SOLUTION | Freq: Four times a day (QID) | RESPIRATORY_TRACT | Status: DC | PRN
  Administered 2019-08-20 – 2019-09-05 (×7): 0.63 mg via RESPIRATORY_TRACT
  Filled 2019-08-18 (×7): qty 3

## 2019-08-18 MED ORDER — LORAZEPAM 2 MG/ML IJ SOLN
1.0000 mg | Freq: Once | INTRAMUSCULAR | Status: AC
  Administered 2019-08-18: 1 mg via INTRAVENOUS
  Filled 2019-08-18: qty 1

## 2019-08-18 MED ORDER — SODIUM CHLORIDE 0.9 % IV SOLN
2.0000 g | Freq: Three times a day (TID) | INTRAVENOUS | Status: AC
  Administered 2019-08-18 – 2019-08-23 (×16): 2 g via INTRAVENOUS
  Filled 2019-08-18 (×16): qty 2

## 2019-08-18 MED ORDER — HYDROCORTISONE 1 % EX CREA
TOPICAL_CREAM | Freq: Two times a day (BID) | CUTANEOUS | Status: DC
  Administered 2019-08-18 (×3): via TOPICAL
  Administered 2019-08-19 – 2019-08-20 (×3): 1 via TOPICAL
  Filled 2019-08-18: qty 28

## 2019-08-18 MED ORDER — SODIUM CHLORIDE 0.9% IV SOLUTION
Freq: Once | INTRAVENOUS | Status: AC
  Administered 2019-08-18: 13:00:00 via INTRAVENOUS

## 2019-08-18 MED ORDER — PRO-STAT SUGAR FREE PO LIQD
30.0000 mL | Freq: Two times a day (BID) | ORAL | Status: DC
  Administered 2019-08-18 – 2019-09-03 (×24): 30 mL via ORAL
  Filled 2019-08-18 (×32): qty 30

## 2019-08-18 MED ORDER — IOHEXOL 300 MG/ML  SOLN
100.0000 mL | Freq: Once | INTRAMUSCULAR | Status: AC | PRN
  Administered 2019-08-18: 100 mL via INTRAVENOUS

## 2019-08-18 MED ORDER — CHLORHEXIDINE GLUCONATE CLOTH 2 % EX PADS
6.0000 | MEDICATED_PAD | Freq: Every day | CUTANEOUS | Status: DC
  Administered 2019-08-19 – 2019-08-21 (×3): 6 via TOPICAL

## 2019-08-18 MED ORDER — IOHEXOL 9 MG/ML PO SOLN
1000.0000 mL | ORAL | Status: AC
  Administered 2019-08-18: 1000 mL via ORAL

## 2019-08-18 MED ORDER — LEVALBUTEROL HCL 0.63 MG/3ML IN NEBU
0.6300 mg | INHALATION_SOLUTION | Freq: Two times a day (BID) | RESPIRATORY_TRACT | Status: DC
  Administered 2019-08-18 – 2019-08-28 (×20): 0.63 mg via RESPIRATORY_TRACT
  Filled 2019-08-18 (×20): qty 3

## 2019-08-18 MED ORDER — MAGNESIUM SULFATE 2 GM/50ML IV SOLN
2.0000 g | Freq: Once | INTRAVENOUS | Status: AC
  Administered 2019-08-18: 2 g via INTRAVENOUS
  Filled 2019-08-18: qty 50

## 2019-08-18 MED ORDER — VANCOMYCIN HCL 10 G IV SOLR
1250.0000 mg | Freq: Two times a day (BID) | INTRAVENOUS | Status: DC
  Administered 2019-08-18 – 2019-08-19 (×2): 1250 mg via INTRAVENOUS
  Filled 2019-08-18 (×3): qty 1250

## 2019-08-18 NOTE — Progress Notes (Signed)
Pharmacy Antibiotic Note  Isaac Pierce is a 58 y.o. male admitted on 08/17/2019 with sepsis.  Pharmacy has been consulted for Vancomycin, zosyn dosing.  Plan: Vancomycin 1.5gm iv x1, then Vancomycin 1250 mg IV Q 12 hrs. Goal AUC 400-550. Expected AUC: 502 SCr used: 0.8 (adjusted)  Zosyn 3.375gm iv x1, then Zosyn 3.375g IV Q8H infused over 4hrs.    Height: 5\' 11"  (180.3 cm) Weight: 159 lb 9.8 oz (72.4 kg) IBW/kg (Calculated) : 75.3  Temp (24hrs), Avg:98.3 F (36.8 C), Min:98.1 F (36.7 C), Max:98.7 F (37.1 C)  Recent Labs  Lab 08/17/19 1914 08/17/19 2052 08/18/19 0134 08/18/19 0437  WBC 3.4*  --   --   --   CREATININE 0.52*  --   --   --   LATICACIDVEN 6.4* 6.8* 6.0* 4.6*    Estimated Creatinine Clearance: 105.6 mL/min (A) (by C-G formula based on SCr of 0.52 mg/dL (L)).    No Known Allergies  Antimicrobials this admission: Vancomycin 08/17/2019 >> Zosyn 08/17/2019 >>   Dose adjustments this admission: -  Microbiology results: -  Thank you for allowing pharmacy to be a part of this patient's care.  Nani Skillern Crowford 08/18/2019 6:21 AM

## 2019-08-18 NOTE — Progress Notes (Signed)
  Echocardiogram 2D Echocardiogram has been performed.  Jennette Dubin 08/18/2019, 2:10 PM

## 2019-08-18 NOTE — Progress Notes (Signed)
PHARMACY - PHYSICIAN COMMUNICATION CRITICAL VALUE ALERT - BLOOD CULTURE IDENTIFICATION (BCID)  Isaac Pierce is an 58 y.o. male who presented to [facility] on 08/17/2019 with a chief complaint of shortness of breath.   Assessment: Concern for severe sepsis from unknown source. CT angiogram of the chest negative for PE but showed bulky mediastinal, bilateral hilar, and bilateral axillary lymphadenopathy which is concerning for a primary lymphoproliferative process such as lymphoma versus metastatic disease from an unknown primary. Aerobic bottle of 1 set of blood cultures with gram positive cocci. Per microbiology lab, due to shortage of reagent, BCID not being run on gram positive organisms in 1 set of blood cultures.   Name of physician (or Provider) Contacted: Dr. Alfredia Ferguson  Current antibiotics: Vancomycin and Zosyn  Changes to prescribed antibiotics recommended:  Per MD, change Zosyn to Cefepime to decrease risk of nephrotoxicity. Will start Cefepime 2g IV q8h. Continue Vancomycin per MD.    Lindell Spar M 08/18/2019  3:26 PM

## 2019-08-18 NOTE — Progress Notes (Signed)
PROGRESS NOTE    Isaac Pierce  UEK:[id number] DOB: [birth year] DOA: 08/17/2019 PCP: Patient, No Pcp Per   Brief Narrative:  HPI per Dr. Shela Leff on 08/17/2019 Isaac Pierce is a 58 y.o. male with a past medical history of chronic back pain, GERD, anxiety, homelessness presenting with a chief complaint of shortness of breath.  Patient states he returned here from [place] about 5 weeks ago and since then has been doing very poorly.  He has no energy and stays in bed all the time.  He has no appetite.  He is feeling short of breath even at rest and his breathing has been getting progressively worse.  He sometimes has a cough productive of white sputum.  He has lost a significant amount of weight.  His legs have been very swollen and his abdomen is also swollen.  He has noticed lumps underneath his axilla and on both sides of his neck.  He wakes up at night drenched in sweat.  States both his parents died from [place].  Denies personal history of malignancy.  He was previously smoking 1 to 2 packs of cigarettes per day but since he has been sick is now only smoking 1 to 4 cigarettes/day.  ED Course: Tachycardic with heart rate in the 110s to 120s.  Tachypneic with respiratory rate in the 20s.  Blood pressure intermittently soft and patient received 1 L normal saline bolus.  Oxygen saturation 96-100% on room air.  Placed on 2 L supplemental oxygen via nasal cannula for increased work of breathing.  Labs showing pancytopenia (WBC count 3.4, hemoglobin 4.6, platelet count 39,000).  FOBT negative.  Other lab abnormalities seen:  Lactic acid 6.4.  Sodium 126.  Bicarb 19, anion gap 15.  BNP normal.  D-dimer elevated at 5.16.  High-sensitivity troponin mildly elevated at 22.  EKG not suggestive of ACS. Pending labs: Pathologist smear review, blood culture x2, SARS-CoV-2 test Chest x-ray showing mild central vascular congestion with small bilateral effusions.  Atelectasis or mild pneumonia at the left  greater than right lung base. Chest CT angiogram (limited exam) negative for PE.  Showing bulky mediastinal, bilateral hilar, and bilateral axillary lymphadenopathy.  Findings concerning for primary lymphoproliferative process such as lymphoma.  Metastatic disease secondary to unknown primary is also a consideration.  Multiple bilateral noncalcified pulmonary nodules measuring up to 10 mm.  Small bilateral pleural effusions, left greater than right with associated compressive atelectasis.  Hepatosplenomegaly within the visualized upper abdomen with ill-defined area of low density within the peripheral aspect of the spleen measuring 3.7 x 2.8 cm, findings concerning for lymphomatous involvement versus metastatic disease versus possible sequela of splenic injury. 2 units PRBCs ordered. ED provider discussed the case with Dr. Julien Nordmann from oncology, will consult in a.m.  **Interim History And severe respiratory distress this morning was given IV Lasix with improvement.  Pulmonary is consulted for further evaluation recommendations and medical oncology recommending biopsy and he underwent a lymph node biopsy today of the dominant right inguinal lymph node.  Patient blood count improved slightly after 2 years will be given an additional 1 unit  Assessment & Plan:   Principal Problem:   Severe sepsis ([place]) Active Problems:   Malignancy ([place])   Pancytopenia (HCC)   Dyspnea   Pleural effusion   Hyponatremia  Lactic Acidosis/ Concern for severe sepsis from unknown source and ? Bacteremia -Lactic acid wassignificantly elevated at 6.4.   -Although this could be related to underlying malignancy, there remains concern for  severe sepsis given tachycardia, tachypnea, and soft blood pressure readings.  No fever.  Does have lymphopenia.  Chest CTA not suggestive of pneumonia and is negative for PE.  UA not suggestive of infection. -Received 1 L normal saline bolus and was given more fluid and no is KVO'd     -Continue to monitor blood pressure closely and will not give additional fluid boluses to keep MAP >65 given severe Volume Overload. -Give broad-spectrum antibiotics including vancomycin and Zosyn but will change to IV Cefepime -Checked procalcitonin level and was 2.60 -Trending Lactic Acid and trended down from 6.8 -> 2.9 -Blood culture x2 pending aerobic set of 1 blood cultures gram-positive cocci could be contaminant -Urine culture Pending as well -SARS-CoV-2 test Negative  -Repeat CXR this AM showed "Stable cardiomediastinal silhouette. No pneumothorax is noted. Mild bibasilar atelectasis or edema is noted with probable small pleural effusions. Bony thorax is unremarkable." -Change IV Zosyn to IV cefepime and will continue IV vancomycin as well  Concern for malignancy/ lymphoproliferative disorder -Patient is presenting with a 5-week history of dyspnea, night sweats, anorexia, significant weight loss, and diffuse lymphadenopathy.  -Chest CT angiogram showing bulky mediastinal, bilateral hilar, and bilateral axillary lymphadenopathy.  Findings concerning for primary lymphoproliferative process such as lymphoma.  Metastatic disease secondary to unknown primary is also consideration.  Also showing multiple bilateral noncalcified pulmonary nodules measuring up to 10 mm.  Hepatosplenomegaly within the visualized upper abdomen with ill-defined area of low density within the peripheral aspect of the spleen measuring 3.7 x 2.8 cm, findings concerning for lymphomatous involvement versus metastatic disease versus possible sequela of splenic injury. -ED provider discussed the case with Dr. Julien Nordmann from oncology, will consult in a.m. -CT abdomen pelvis for further staging and will be done in AM -Since concerning for lymphoma versus metastatic disease from an unknown primary and patient is to undergo an ultrasound-guided lymph node biopsy by IR and had a technically successful ultrasound-guided biopsy of  the dominant right inguinal lymph node but they are also considering obtaining a bone marrow biopsy this admission -Pulmonary was also consulted and recommending biopsy and they doubt that he needs mediastinoscopy  Pancytopenia (leukopenia, severe anemia, and severe thrombocytopenia) -Suspect related to malignancy or lymphoproliferative disorder.  WBC count 3.4, hemoglobin 4.6, platelet count 39,000.  Blood loss anemia from acute GI bleed less likely given negative FOBT. -Anemia panel done and showed an iron level of 87, TIBC 126, TIBC 230, saturation ratios of 41%, ferritin of 1444, folate of 5.4, and vitamin B12 level 1022 -Patient's hemoglobin/hematocrit went from 4.6/14.3 and is now 7.0/12.3 after 2 units and will transfuse 1 more unit PRBCs -Patient is platelet count is now 42,000 and WBC is 3.8 -Oncology consulted and recommending PLT transfusions for hemoglobin less than 8 and recommended platelet transfusion of platelets drop below 20,000 or active bleeding -Pathologist smear review ordered  -Medical oncology is also started the patient on folic acid 1 mg p.o. 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   -Although patient does have significant peripheral edema, no overt pulmonary edema reported on CT but repeat  .   -BNP normal at 93.7 and 91.8   -Was not initially hypoxic but was Placed on 2 L supplemental oxygen for increased work of breathing and  now on 3 Liters -BP improved and given IV Lasix -Continuous pulse oximetry and maintain O2 saturation greater than 94% -Continue supplemental oxygen via nasal cannula wean O2 as tolerated and currently was on 3  L  Hyponatremia -Initially thought to be from Hypovolemia but patient had significant JVD and LE Edema -Stopped IVF and started Diuresis and given IV Lasix 40 mg this AM and another 20 mg after 3rd unit of Blood  -Initially Patient may have appeared dehydrated as he Endorsed decreased p.o. intake for over a month.  -Sodium 126 on admission and repeat this AM was 128. -IV fluid hydration now KVO'd  -Continue to monitor CMP in AM  -Check serum osmolarity  Normal anion gap metabolic acidosis -Bicarb 19, anion gap 15 on admission and repeat showed an anion gap of 11, CO2 21, and a chloride of 100.  Patient is not on a diuretic and not endorsing diarrhea. -IV fluid hydration as above and is now KVO to -Continue to monitor CMP  Bilateral lower extremity edema/ concern for DVT -Patient has significant bilateral lower extremity edema.   -D-dimer elevated at 5.16.  CT angiogram negative for PE.  There remains concern for DVT given imaging findings consistent with malignancy. -Bilateral lower extremity Dopplers no evidence of the DVT in the right left lower extremity and there is no cystic structures found in popliteal fossa of both extremities  Hypomagnesemia -Patient magnesium level this morning is 1.5 -Replete with IV mag sulfate 2 g -Continue to monitor replete as necessary -Repeat magnesium level in a.m.  Troponinemia High-sensitivity troponin mildly elevated at 22.   -EKG not suggestive of ACS.  Patient is not complaining of chest pain and appears comfortable. -Cardiac monitoring -Trend troponin and a trended up to 128  Tobacco Use -Smoking cessation counseling given and was started on a nicotine patch  Volume Overload -In the setting possible CHF versus hypoalbuminemia -BNP was normal -We will check cardiac echocardiogram and he was given IV Lasix today -Currently -2.689 L since Lasix Administration and a total of -1.184 L since admission -Continue monitor signs and symptoms  of volume overload and strict I's and O's and daily weights   Anxiety -Continue home hydroxyzine  Chronic back pain -Tylenol as needed  GERD -Continue PPI with Pantoprazole 20 mg Daily   Homelessness -Social work consulted for further assistance   HIV screening The patient falls between the ages of 13-64 and should be screened for HIV, therefore HIV testing ordered and is pending  Hyperbilirubinemia and abnormal LFTs -AST was 67 ALT was 69 and T bili was 1.4 -Continue to monitor and trend and check acute hepatitis panel and right upper quadrant ultrasound -Repeat CMP in a.m.  DVT prophylaxis: SCDs Code Status: FULL CODE Family Communication: No family present at bedside  Disposition Plan: Pending further clinical improvement   Consultants:   Medical Oncology  Pulmonary   Procedures:  ECHOCARDIOGRAM IMPRESSIONS    1. Left ventricular ejection fraction, by visual estimation, is 60 to 65%. The left ventricle has normal function. There is no left ventricular hypertrophy.  2. Indeterminate diastolic filling due to E-A fusion.  3. The left ventricle has no regional wall motion abnormalities.  4. Global right ventricle has normal systolic function.The right ventricular size is normal. No increase in right ventricular wall thickness.  5. Left atrial size was mildly dilated.  6. Right atrial size was normal.  7. Trivial pericardial effusion is present.  8. The mitral valve is normal in structure. No  evidence of mitral valve regurgitation. No evidence of mitral stenosis.  9. The tricuspid valve is normal in structure. Tricuspid valve regurgitation is trivial. 10. The aortic valve is tricuspid. Aortic valve regurgitation is not visualized. No evidence of aortic valve sclerosis or stenosis. 11. TR signal is inadequate for assessing pulmonary artery systolic pressure. 12. The inferior vena cava is normal in size with greater than 50% respiratory variability, suggesting  right atrial pressure of 3 mmHg.  FINDINGS  Left Ventricle: Left ventricular ejection fraction, by visual estimation, is 60 to 65%. The left ventricle has normal function. The left ventricle has no regional wall motion abnormalities. The left ventricular internal cavity size was the left  ventricle is normal in size. There is no left ventricular hypertrophy. Indeterminate diastolic filling due to E-A fusion.  Right Ventricle: The right ventricular size is normal. No increase in right ventricular wall thickness. Global RV systolic function is has normal systolic function.  Left Atrium: Left atrial size was mildly dilated.  Right Atrium: Right atrial size was normal in size  Pericardium: Trivial pericardial effusion is present.  Mitral Valve: The mitral valve is normal in structure. No evidence of mitral valve stenosis by observation. No evidence of mitral valve regurgitation.  Tricuspid Valve: The tricuspid valve is normal in structure. Tricuspid valve regurgitation is trivial.  Aortic Valve: The aortic valve is tricuspid. Aortic valve regurgitation is not visualized. The aortic valve is structurally normal, with no evidence of sclerosis or stenosis.  Pulmonic Valve: The pulmonic valve was normal in structure. Pulmonic valve regurgitation is not visualized.  Aorta: The aortic root is normal in size and structure.  Venous: The inferior vena cava is normal in size with greater than 50% respiratory variability, suggesting right atrial pressure of 3 mmHg.  IAS/Shunts: No atrial level shunt detected by color flow Doppler.     LEFT VENTRICLE PLAX 2D LVIDd:         5.50 cm Diastology LVIDs:         3.40 cm LV e' lateral: 16.20 cm/s LV PW:         0.90 cm LV e' medial:  10.40 cm/s LV IVS:        0.90 cm LV SV:         100 ml LV SV Index:   52.54    RIGHT VENTRICLE RV S prime:     23.40 cm/s TAPSE (M-mode): 2.7 cm  LEFT ATRIUM             Index LA diam:        3.80  cm 1.98 cm/m LA Vol (A2C):   87.1 ml 45.46 ml/m LA Vol (A4C):   61.0 ml 31.84 ml/m LA Biplane Vol: 72.9 ml 38.05 ml/m  AORTIC VALVE LVOT Vmax:   149.00 cm/s LVOT Vmean:  102.000 cm/s LVOT VTI:    0.266 m   AORTA Ao Root diam: 3.10 cm    SHUNTS Systemic VTI: 0.27 m   Antimicrobials:  Anti-infectives (From admission, onward)   Start     Dose/Rate Route Frequency Ordered Stop   08/18/19 1200  vancomycin (VANCOCIN) 1,250 mg in sodium chloride 0.9 % 250 mL IVPB     1,250 mg 166.7 mL/hr over 90 Minutes Intravenous Every 12 hours 08/18/19 0621     08/18/19 0600  piperacillin-tazobactam (ZOSYN) IVPB 3.375 g     3.375 g 12.5 mL/hr over 240 Minutes Intravenous Every 8 hours 08/17/19 2251     08/17/19 2300  vancomycin (VANCOCIN)  1,500 mg in sodium chloride 0.9 % 500 mL IVPB     1,500 mg 250 mL/hr over 120 Minutes Intravenous  Once 08/17/19 2250 08/18/19 0332   08/17/19 2300  piperacillin-tazobactam (ZOSYN) IVPB 3.375 g     3.375 g 100 mL/hr over 30 Minutes Intravenous  Once 08/17/19 2250 08/18/19 0131     Subjective: Seen and examined at bedside and he feels removing a little bit better will complain that he is still urinating in his bed.  No nausea or vomiting.  Did feel little dyspneic.  States that her legs are significantly swollen.  No other concerns or complaints at this time.  Objective: Vitals:   08/18/19 0612 08/18/19 0622 08/18/19 0633 08/18/19 0657  BP:      Pulse: (!) 126 (!) 128 (S) (!) 139 (!) 128  Resp: (!) 27 (!) 27 (!) 28 (!) 27  Temp:      TempSrc:      SpO2: 98% 98% 98% 100%  Weight:      Height:        Intake/Output Summary (Last 24 hours) at 08/18/2019 0730 Last data filed at 08/18/2019 0600 Gross per 24 hour  Intake 1980 ml  Output 475 ml  Net 1505 ml   Filed Weights   08/17/19 2100 08/18/19 0107 08/18/19 0400  Weight: 70.5 kg 72.4 kg 72.4 kg   Examination: Physical Exam:  Constitutional: Thin Caucasian male in mild distress appears very  anxious and uncomfortable Eyes: Lids and conjunctivae normal, sclerae anicteric  ENMT: External Ears, Nose appear normal. Grossly normal hearing. Neck: Appears normal, supple, no cervical masses, normal ROM, no appreciable thyromegaly; Had some JVD Respiratory: Diminished to auscultation bilaterally, no wheezing, rales, rhonchi or crackles.  Patient is little tachypneic. No accessory muscle use.  Wearing supplemental oxygen via nasal cannula Cardiovascular: Tachycardic rate, no murmurs / rubs / gallops. S1 and S2 auscultated.  1-2+ lower extremity pitting edema.  Abdomen: Soft, non-tender, non-distended.  Bowel sounds positive.  GU: Deferred. Musculoskeletal: No clubbing / cyanosis of digits/nails. No joint deformity upper and lower extremities.  Skin: Has palpable lymphadenopathy no induration; Warm and dry.  Neurologic: CN 2-12 grossly intact with no focal deficits but is slurring his speech a little bit after receiving Ativan. Romberg sign and cerebellar reflexes not assessed.  Psychiatric: Normal judgment and insight. Alert and oriented x 3. Anxious mood and appropriate affect.   Data Reviewed: I have personally reviewed following labs and imaging studies  CBC: Recent Labs  Lab 08/17/19 1914  WBC 3.4*  NEUTROABS 0.9*  HGB 4.6*  HCT 14.3*  MCV 85.1  PLT 39*   Basic Metabolic Panel: Recent Labs  Lab 08/17/19 1914  NA 126*  K 4.1  CL 92*  CO2 19*  GLUCOSE 78  BUN 10  CREATININE 0.52*  CALCIUM 8.6*   GFR: Estimated Creatinine Clearance: 105.6 mL/min (A) (by C-G formula based on SCr of 0.52 mg/dL (L)). Liver Function Tests: No results for input(s): AST, ALT, ALKPHOS, BILITOT, PROT, ALBUMIN in the last 168 hours. No results for input(s): LIPASE, AMYLASE in the last 168 hours. No results for input(s): AMMONIA in the last 168 hours. Coagulation Profile: No results for input(s): INR, PROTIME in the last 168 hours. Cardiac Enzymes: No results for input(s): CKTOTAL, CKMB,  CKMBINDEX, TROPONINI in the last 168 hours. BNP (last 3 results) No results for input(s): PROBNP in the last 8760 hours. HbA1C: No results for input(s): HGBA1C in the last 72 hours. CBG: No results  for input(s): GLUCAP in the last 168 hours. Lipid Profile: No results for input(s): CHOL, HDL, LDLCALC, TRIG, CHOLHDL, LDLDIRECT in the last 72 hours. Thyroid Function Tests: No results for input(s): TSH, T4TOTAL, FREET4, T3FREE, THYROIDAB in the last 72 hours. Anemia Panel: No results for input(s): VITAMINB12, FOLATE, FERRITIN, TIBC, IRON, RETICCTPCT in the last 72 hours. Sepsis Labs: Recent Labs  Lab 08/17/19 1914 08/17/19 2052 08/18/19 0134 08/18/19 0437  LATICACIDVEN 6.4* 6.8* 6.0* 4.6*    Recent Results (from the past 240 hour(s))  SARS CORONAVIRUS 2 (TAT 6-24 HRS) Nasopharyngeal Nasopharyngeal Swab     Status: None   Collection Time: 08/17/19  8:52 PM   Specimen: Nasopharyngeal Swab  Result Value Ref Range Status   SARS Coronavirus 2 NEGATIVE NEGATIVE Final    Comment: (NOTE) SARS-CoV-2 target nucleic acids are NOT DETECTED. The SARS-CoV-2 RNA is generally detectable in upper and lower respiratory specimens during the acute phase of infection. Negative results do not preclude SARS-CoV-2 infection, do not rule out co-infections with other pathogens, and should not be used as the sole basis for treatment or other patient management decisions. Negative results must be combined with clinical observations, patient history, and epidemiological information. The expected result is Negative. Fact Sheet for Patients: SugarRoll.be Fact Sheet for Healthcare Providers: https://www.woods-mathews.com/ This test is not yet approved or cleared by the [place] FDA and  has been authorized for detection and/or diagnosis of SARS-CoV-2 by FDA under an Emergency Use Authorization (EUA). This EUA will remain  in effect (meaning this test can be  used) for the duration of the COVID-19 declaration under Section 56 4(b)(1) of the Act, 21 U.S.C. section 360bbb-3(b)(1), unless the authorization is terminated or revoked sooner. Performed at [facility], [place] [address] [place], [place] [address]   MRSA PCR Screening     Status: None   Collection Time: 08/18/19 12:22 AM   Specimen: Nasal Mucosa; Nasopharyngeal  Result Value Ref Range Status   MRSA by PCR NEGATIVE NEGATIVE Final    Comment:        The GeneXpert MRSA Assay (FDA approved for NASAL specimens only), is one component of a comprehensive MRSA colonization surveillance program. It is not intended to diagnose MRSA infection nor to guide or monitor treatment for MRSA infections. Performed at [facility], [place] [address] [place], [place] [address]      Radiology Studies: Dg Chest 2 View  Result Date: 08/17/2019 CLINICAL DATA:  Shortness of breath EXAM: CHEST - 2 VIEW COMPARISON:  None. FINDINGS: Small bilateral pleural effusions. Mild basilar airspace disease. Borderline heart size with mild central vascular congestion. No pneumothorax. IMPRESSION: Mild central vascular congestion with small bilateral effusions. Atelectasis or mild pneumonia at the left greater than right lung base. Electronically Signed   By: Donavan Foil M.D.   On: 08/17/2019 19:57   Ct Angio Chest Pe W/cm &/or Wo Cm  Result Date: 08/17/2019 CLINICAL DATA:  Shortness of breath for 1 month EXAM: CT ANGIOGRAPHY CHEST WITH CONTRAST TECHNIQUE: Multidetector CT imaging of the chest was performed using the standard protocol during bolus administration of intravenous contrast. Multiplanar CT image reconstructions and MIPs were obtained to evaluate the vascular anatomy. CONTRAST:  156m OMNIPAQUE IOHEXOL 350 MG/ML SOLN COMPARISON:  Chest x-ray 08/17/2019 FINDINGS: Cardiovascular: Suboptimal contrast bolus timing and respiratory motion artifact degraded examination of the more distal  pulmonary arterial tree. No filling defect within the main or lobar branch pulmonary arteries. No evidence of right heart strain.  Heart size is normal. There is pericardial thickening. Thoracic aorta is normal in course and caliber. Mediastinum/Nodes: Bulky mediastinal, bilateral hilar, and bilateral axillary lymphadenopathy. Thyroid, trachea, and esophagus are grossly unremarkable. Lungs/Pleura: Small bilateral pleural effusions, left greater than right with associated compressive atelectasis. There are numerous bilateral noncalcified pulmonary nodules. Reference nodules include 10 mm right lower lobe juxtapleural nodule (series 6, image 88). 9 mm medial right lower lobe nodule (series 6, image 106). 7 mm anterior right middle lobe nodule (series 6, image 107). 7 mm left lower lobe nodule (series 6, image 90). 8 mm lingular nodule (series 6, image 92). No pneumothorax. Upper Abdomen: Hepatosplenomegaly within the visualized upper abdomen. Ill-defined area of low density within the peripheral aspect of the spleen measuring approximately 3.7 x 2.8 cm (series 4, image 135). Musculoskeletal: No chest wall abnormality. No acute or significant osseous findings. Review of the MIP images confirms the above findings. IMPRESSION: 1. Limited exam. No filling defect to the lobar branch level to suggest pulmonary embolism. 2. Bulky mediastinal, bilateral hilar, and bilateral axillary lymphadenopathy. Findings concerning for primary lymphoproliferative process such as lymphoma. Metastatic disease secondary to unknown primary is also a consideration. 3. Multiple bilateral noncalcified pulmonary nodules measuring up to 10 mm, likely a result of the same process as listed above. 4. Small bilateral pleural effusions, left greater than right with associated compressive atelectasis. 5. Hepatosplenomegaly within the visualized upper abdomen with ill-defined area of low density within the peripheral aspect of the spleen measuring 3.7 x  2.8 cm. Findings are concerning for lymphomatous involvement versus metastatic disease. Alternatively, sequela of splenic injury could have a similar appearance. These results were called by telephone at the time of interpretation on 08/17/2019 at 8:49 pm to provider MARGAUX VENTER , who verbally acknowledged these results. Electronically Signed   By: Davina Poke M.D.   On: 08/17/2019 20:51   Scheduled Meds:  Chlorhexidine Gluconate Cloth  6 each Topical Daily   hydrocortisone cream   Topical BID   iohexol  1,000 mL Oral Q1H   mouth rinse  15 mL Mouth Rinse BID   nicotine  7 mg Transdermal Daily   pantoprazole  20 mg Oral Daily   sodium chloride (PF)       Continuous Infusions:  sodium chloride     piperacillin-tazobactam (ZOSYN)  IV 3.375 g (08/18/19 0515)   sodium chloride     vancomycin      LOS: 1 day   Kerney Elbe, DO [facility] PAGER is on AMION  If 7PM-7AM, please contact night-coverage [email or web address]

## 2019-08-18 NOTE — Procedures (Signed)
Pre Procedure Dx: Diffuse lymphadenopathy Post Procedural Dx: Same  Technically successful US guided biopsy of dominant right inguinal lymph node.   EBL: Trace  No immediate complications.   Ronny Bacon, MD Pager #: [phone]

## 2019-08-18 NOTE — Consult Note (Signed)
Chief Complaint: Patient was seen in consultation today for image guided biopsy of axillary lymph node   Referring Physician(s): Caldwell, NP  Supervising Physician: Sandi Mariscal  Patient Status: [facility] - In-pt  History of Present Illness: Isaac Pierce is a 58 y.o. male , ex-smoker and former IV drug user, who was admitted to [facility] yesterday with persistent dyspnea, weight loss, lower extremity edema, night sweats and diminished appetite.  Lower extremity venous Doppler study was negative for DVT.  Patient is COVID-19 negative.  Also with positive family history of lymphoma.  CT chest 11/17 revealed:  No filling defect to the lobar branch level to suggest pulmonary embolism. 2. Bulky mediastinal, bilateral hilar, and bilateral axillary lymphadenopathy. Findings concerning for primary lymphoproliferative process such as lymphoma. Metastatic disease secondary to unknown primary is also a consideration. 3. Multiple bilateral noncalcified pulmonary nodules measuring up to 10 mm, likely a result of the same process as listed above. 4. Small bilateral pleural effusions, left greater than right with associated compressive atelectasis. 5. Hepatosplenomegaly within the visualized upper abdomen with ill-defined area of low density within the peripheral aspect of the spleen measuring 3.7 x 2.8 cm. Findings are concerning for lymphomatous involvement versus metastatic disease. Alternatively, sequela of splenic injury could have a similar appearance.  CT abdomen pelvis pending; current labs include WBC 3.8, hemoglobin 7, platelets 42K, creatinine 0.53.  Patient remains tachycardic.  BP okay.  Request now received from oncology for image guided axillary lymph node biopsy for further evaluation.  Past Medical History:  Diagnosis Date   Anxiety    Back pain    GERD (gastroesophageal reflux disease)     History reviewed. No pertinent surgical history.  Allergies: Patient  has no known allergies.  Medications: Prior to Admission medications   Medication Sig Start Date End Date Taking? Authorizing Provider  hydrOXYzine (ATARAX/VISTARIL) 10 MG tablet Take 10 mg by mouth 3 (three) times daily as needed. 05/31/19  Yes [provider]  ibuprofen (ADVIL) 200 MG tablet Take 200 mg by mouth every 6 (six) hours as needed for moderate pain.   Yes [provider]  omeprazole (PRILOSEC) 20 MG capsule Take 20 mg by mouth daily. 07/23/19  Yes [provider]     Family History  Problem Relation Age of Onset   Lymphoma Mother    Lymphoma Father     Social History   Socioeconomic History   Marital status: Single    Spouse name: Not on file   Number of children: Not on file   Years of education: Not on file   Highest education level: Not on file  Occupational History   Not on file  Social Needs   Financial resource strain: Not on file   Food insecurity    Worry: Not on file    Inability: Not on file   Transportation needs    Medical: Not on file    Non-medical: Not on file  Tobacco Use   Smoking status: Current Every Day Smoker    Types: Cigarettes   Smokeless tobacco: Never Used  Substance and Sexual Activity   Alcohol use: Not Currently   Drug use: Not Currently   Sexual activity: Not on file  Lifestyle   Physical activity    Days per week: Not on file    Minutes per session: Not on file   Stress: Not on file  Relationships   Social connections    Talks on phone: Not on file  Gets together: Not on file    Attends religious service: Not on file    Active member of club or organization: Not on file    Attends meetings of clubs or organizations: Not on file    Relationship status: Not on file  Other Topics Concern   Not on file  Social History Narrative   Not on file      Review of Systems see above; pt currently sedated from recent ativan  Vital Signs: BP 120/66    Pulse (!) 116    Temp  99.8 F (37.7 C) (Oral)    Resp (!) 23    Ht 5\' 11"  (1.803 m)    Wt 159 lb 9.8 oz (72.4 kg)    SpO2 95%    BMI 22.26 kg/m   Physical Exam patient sedated from Ativan; arousable with shrug; chest with sl diminished breath sounds bases.  Heart with tachycardic but regular rhythm.  Abdomen soft, few bowel sounds, slightly distended, splenomegaly noted.  Bilateral lower extremity edema present.  Palpable cervical/axillary/inguinal lymph nodes   Imaging: Dg Chest 2 View  Result Date: 08/17/2019 CLINICAL DATA:  Shortness of breath EXAM: CHEST - 2 VIEW COMPARISON:  None. FINDINGS: Small bilateral pleural effusions. Mild basilar airspace disease. Borderline heart size with mild central vascular congestion. No pneumothorax. IMPRESSION: Mild central vascular congestion with small bilateral effusions. Atelectasis or mild pneumonia at the left greater than right lung base. Electronically Signed   By: Donavan Foil M.D.   On: 08/17/2019 19:57   Ct Angio Chest Pe W/cm &/or Wo Cm  Result Date: 08/17/2019 CLINICAL DATA:  Shortness of breath for 1 month EXAM: CT ANGIOGRAPHY CHEST WITH CONTRAST TECHNIQUE: Multidetector CT imaging of the chest was performed using the standard protocol during bolus administration of intravenous contrast. Multiplanar CT image reconstructions and MIPs were obtained to evaluate the vascular anatomy. CONTRAST:  159mL OMNIPAQUE IOHEXOL 350 MG/ML SOLN COMPARISON:  Chest x-ray 08/17/2019 FINDINGS: Cardiovascular: Suboptimal contrast bolus timing and respiratory motion artifact degraded examination of the more distal pulmonary arterial tree. No filling defect within the main or lobar branch pulmonary arteries. No evidence of right heart strain. Heart size is normal. There is pericardial thickening. Thoracic aorta is normal in course and caliber. Mediastinum/Nodes: Bulky mediastinal, bilateral hilar, and bilateral axillary lymphadenopathy. Thyroid, trachea, and esophagus are grossly unremarkable.  Lungs/Pleura: Small bilateral pleural effusions, left greater than right with associated compressive atelectasis. There are numerous bilateral noncalcified pulmonary nodules. Reference nodules include 10 mm right lower lobe juxtapleural nodule (series 6, image 88). 9 mm medial right lower lobe nodule (series 6, image 106). 7 mm anterior right middle lobe nodule (series 6, image 107). 7 mm left lower lobe nodule (series 6, image 90). 8 mm lingular nodule (series 6, image 92). No pneumothorax. Upper Abdomen: Hepatosplenomegaly within the visualized upper abdomen. Ill-defined area of low density within the peripheral aspect of the spleen measuring approximately 3.7 x 2.8 cm (series 4, image 135). Musculoskeletal: No chest wall abnormality. No acute or significant osseous findings. Review of the MIP images confirms the above findings. IMPRESSION: 1. Limited exam. No filling defect to the lobar branch level to suggest pulmonary embolism. 2. Bulky mediastinal, bilateral hilar, and bilateral axillary lymphadenopathy. Findings concerning for primary lymphoproliferative process such as lymphoma. Metastatic disease secondary to unknown primary is also a consideration. 3. Multiple bilateral noncalcified pulmonary nodules measuring up to 10 mm, likely a result of the same process as listed above. 4. Small bilateral pleural effusions,  left greater than right with associated compressive atelectasis. 5. Hepatosplenomegaly within the visualized upper abdomen with ill-defined area of low density within the peripheral aspect of the spleen measuring 3.7 x 2.8 cm. Findings are concerning for lymphomatous involvement versus metastatic disease. Alternatively, sequela of splenic injury could have a similar appearance. These results were called by telephone at the time of interpretation on 08/17/2019 at 8:49 pm to provider MARGAUX VENTER , who verbally acknowledged these results. Electronically Signed   By: Davina Poke M.D.   On:  08/17/2019 20:51   Dg Chest Port 1 View  Result Date: 08/18/2019 CLINICAL DATA:  Dyspnea. EXAM: PORTABLE CHEST 1 VIEW COMPARISON:  August 17, 2019. FINDINGS: Stable cardiomediastinal silhouette. No pneumothorax is noted. Mild bibasilar atelectasis or edema is noted with probable small pleural effusions. Bony thorax is unremarkable. IMPRESSION: Mild bibasilar atelectasis or edema is noted with probable small pleural effusions. Electronically Signed   By: Marijo Conception M.D.   On: 08/18/2019 08:27   Vas [place] Lower Extremity Venous (dvt)  Result Date: 08/18/2019  Lower Venous Study Indications: Elevated d dimer.  Comparison Study: no prior Performing Technologist: Abram Sander RVS  Examination Guidelines: A complete evaluation includes B-mode imaging, spectral Doppler, color Doppler, and power Doppler as needed of all accessible portions of each vessel. Bilateral testing is considered an integral part of a complete examination. Limited examinations for reoccurring indications may be performed as noted.  +---------+---------------+---------+-----------+----------+--------------+  RIGHT     Compressibility Phasicity Spontaneity Properties Thrombus Aging  +---------+---------------+---------+-----------+----------+--------------+  CFV       Full            Yes       Yes                                    +---------+---------------+---------+-----------+----------+--------------+  SFJ       Full                                                             +---------+---------------+---------+-----------+----------+--------------+  FV Prox   Full                                                             +---------+---------------+---------+-----------+----------+--------------+  FV Mid    Full                                                             +---------+---------------+---------+-----------+----------+--------------+  FV Distal Full                                                              +---------+---------------+---------+-----------+----------+--------------+  PFV  Full                                                             +---------+---------------+---------+-----------+----------+--------------+  POP       Full            Yes       Yes                                    +---------+---------------+---------+-----------+----------+--------------+  PTV       Full                                                             +---------+---------------+---------+-----------+----------+--------------+  PERO      Full                                                             +---------+---------------+---------+-----------+----------+--------------+   +---------+---------------+---------+-----------+----------+--------------+  LEFT      Compressibility Phasicity Spontaneity Properties Thrombus Aging  +---------+---------------+---------+-----------+----------+--------------+  CFV       Full            Yes       Yes                                    +---------+---------------+---------+-----------+----------+--------------+  SFJ       Full                                                             +---------+---------------+---------+-----------+----------+--------------+  FV Prox   Full                                                             +---------+---------------+---------+-----------+----------+--------------+  FV Mid    Full                                                             +---------+---------------+---------+-----------+----------+--------------+  FV Distal Full                                                             +---------+---------------+---------+-----------+----------+--------------+  PFV       Full                                                             +---------+---------------+---------+-----------+----------+--------------+  POP       Full            Yes       Yes                                     +---------+---------------+---------+-----------+----------+--------------+  PTV       Full                                                             +---------+---------------+---------+-----------+----------+--------------+  PERO      Full                                                             +---------+---------------+---------+-----------+----------+--------------+     Summary: Right: There is no evidence of deep vein thrombosis in the lower extremity. No cystic structure found in the popliteal fossa. Left: There is no evidence of deep vein thrombosis in the lower extremity. No cystic structure found in the popliteal fossa.  *See table(s) above for measurements and observations.    Preliminary     Labs:  CBC: Recent Labs    08/17/19 1914 08/18/19 0749  WBC 3.4* 3.8*  HGB 4.6* 7.0*  HCT 14.3* 21.3*  PLT 39* 42*    COAGS: No results for input(s): INR, APTT in the last 8760 hours.  BMP: Recent Labs    08/17/19 1914 08/18/19 0749  NA 126* 128*  K 4.1 3.9  CL 92* 96*  CO2 19* 21*  GLUCOSE 78 89  BUN 10 11  CALCIUM 8.6* 8.6*  CREATININE 0.52* 0.53*  GFRNONAA >60 >60  GFRAA >60 >60    LIVER FUNCTION TESTS: Recent Labs    08/18/19 0749  BILITOT 1.4*   1.4*  AST 69*   67*  ALT 24   25  ALKPHOS 160*   171*  PROT 4.4*   4.6*  ALBUMIN 2.0*   2.0*    TUMOR MARKERS: No results for input(s): AFPTM, CEA, CA199, CHROMGRNA in the last 8760 hours.  Assessment and Plan: 58 y.o. male , ex-smoker and former IV drug user, who was admitted to [facility] yesterday with persistent dyspnea, weight loss, lower extremity edema, night sweats and diminished appetite.  Lower extremity venous Doppler study was negative for DVT.  Patient is COVID-19 negative.  Also with positive family history of lymphoma.  CT chest 11/17 revealed:  No filling defect to the lobar branch level to suggest pulmonary embolism. 2. Bulky mediastinal, bilateral hilar, and bilateral  axillary lymphadenopathy. Findings concerning for primary lymphoproliferative process such as lymphoma. Metastatic disease secondary to unknown primary is also  a consideration. 3. Multiple bilateral noncalcified pulmonary nodules measuring up to 10 mm, likely a result of the same process as listed above. 4. Small bilateral pleural effusions, left greater than right with associated compressive atelectasis. 5. Hepatosplenomegaly within the visualized upper abdomen with ill-defined area of low density within the peripheral aspect of the spleen measuring 3.7 x 2.8 cm. Findings are concerning for lymphomatous involvement versus metastatic disease. Alternatively, sequela of splenic injury could have a similar appearance.  CT abdomen pelvis pending; current labs include WBC 3.8, hemoglobin 7, platelets 42K, creatinine 0.53.  Patient remains tachycardic.  BP okay.  Request now received from oncology for image guided axillary lymph node biopsy for further evaluation.  Imaging studies were reviewed by Dr. Pascal Lux.  Details/risks of lymph node biopsy, including but not limited to, internal bleeding, infection, injury to adjacent structures discussed with patient's father with his understanding and consent.  Procedure scheduled for today.   Thank you for this interesting consult.  I greatly enjoyed meeting Isaac Pierce and look forward to participating in their care.  A copy of this report was sent to the requesting provider on this date.  Electronically Signed: D. Rowe Robert, PA-C 08/18/2019, 12:14 PM   I spent a total of 20 minutes  in face to face in clinical consultation, greater than 50% of which was counseling/coordinating care for image guided axillary lymph node biopsy

## 2019-08-18 NOTE — Consult Note (Signed)
NAME:  Isaac Pierce, MRN:  [id number], DOB:  11-Aug-1961, LOS: 1 ADMISSION DATE:  08/17/2019, CONSULTATION DATE:  11/18 REFERRING MD:  Dr. Alfredia Ferguson, CHIEF COMPLAINT:  SOB   Brief History   58 y/o M admitted with SOB, weight loss and LE swelling.  Found to have pancytopenia with CT of chest with extensive lymphadenopathy, concern for malignancy.   History of present illness   58 y/o M who presented to [facility] on 11/17 with reports of shortness of breath for one month and one week of LE swelling.    On presentation, he was tachycardic & tachypneic without hypoxia or fever.  Initial labs notable for hyponatremia (Na 126), CO2 19, BUN 10/Cr 0.52, AG 15, BNP 93, troponin 22 > 127, lactic acid 6.4 > down to 4.6 after PRBC, WBC 3.4, Hgb 4.6, HCT 14.3, and platelets 39. D-Dimer 5.16. CXR showed mild central vascular congestion with small bilateral pleural effusions with associated atelectasis. CTA Chest was completed in the setting of dyspnea and elevated D-Dimer which was motion limited but negative for PE.  However, it did show extensive mediastinal lymphadenopathy, bilateral effusions with compressive atelectasis, hepatosplenomegaly with area of low density in the spleen.  The patient was admitted with pancytopenia and increased work of breathing for further evaluation. He was given 2 units of blood for anemia.  Developed worsening SOB and was given lasix.  He required 3L and has maintained.  COVID testing negative.   PCCM consulted 11/18 for increased work of breathing.    Pt reports he smokes but has not in a while.  He states he has been in prison and recently had a short stent for 1.5 week in jail.  "I'm not violent, just a little scuffle".  Reports prior drug use 20 years ago.  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  Notes three months of weight loss, night sweats, decreased appetite.  Notes HIV negative at jail. Reports mother had lymphoma.   Past Medical History  GERD Back Pain   Anxiety Former IV drug abus Former smoker   [place] [facility]   11/17 Admit with pancytopenia, received 2 units PRBC 11/18 PCCM consulted for SOB, additional 1 unit PRBC  Consults:  11/18 PCCM   Procedures:    Significant Diagnostic Tests:  CTA Chest 11/17 >> limited exam, no PE, bulky mediastinal, bilateral hilar and bilateral axillary lymphadenopathy, multiple bilateral non-calcified pulmonary nodules measuring up to 10 mm, small bilateral pleural effusions L>R with compressive atelectasis, hepatosplenomegaly with area of low density in the peripheral aspect of the spleen measuring 3.7 x 2.8 cm.   ECHO 11/18 >>  Peripheral Smear 11/17 >>  LE Venous Duplex 11/18 >>   Micro Data:  COVID 11/17 >> negative  MRSA PCR 11/18 >> negative BCx2 11/17 >>  UA 11/18 >> negative  UC 11/17 >>   Antimicrobials:  Zosyn 11/17 >>   Interim history/subjective:  As above.  RN notes pt received ativan around 0650, now sleepy.  Prior to ativan, had increased work of breathing / accessory muscle use.   Objective   Blood pressure (!) 162/76, pulse (!) 130, temperature 98.2 F (36.8 C), temperature source Oral, resp. rate (!) 28, height '5\' 11"'  (1.803 m), weight 72.4 kg, SpO2 98 %.        Intake/Output Summary (Last 24 hours) at 08/18/2019 0806 Last data filed at 08/18/2019 0600 Gross per 24 hour  Intake 1980 ml  Output 475 ml  Net 1505  ml   Filed Weights   08/17/19 2100 08/18/19 0107 08/18/19 0400  Weight: 70.5 kg 72.4 kg 72.4 kg    Examination: General: adult male lying in bed in NAD  HEENT: MM pink/moist, poor dentition, temporal wasting, pupils 80m/reactive, lids/lashes normal, no palpable supraclavicular LAN Neuro: sedate, awakens to voice, appropriate, mumbles but speech clear, MAE CV: s1s2 rrr, no m/r/g, radial pulses +2 symmetrical, BLE 3+ pitting edema up to knees PULM: mild abdominal accessory muscle use, lungs bilaterally clear without wheeze  GI: soft, bsx4  active Extremities: warm/dry, BLE 3+ pitting edema  Skin: no rashes or lesions, multiple tattoos.   Resolved Hospital Problem list     Assessment & Plan:   Pancytopenia  Associated mediastinal lymphadenopathy, splenic hypodensity on imaging, pulmonary nodules worrisome for lymphoma vs leukemia  -ONC consult per Primary, appreciate input  -Assess CT ABD/Pelvis  -assess HIV, peripheral smear, LFT's, HIV -continue empiric vancomycin / zosyn for now, low threshold to stop -anticipate he will need bone marrow biopsy, +/- tissue sampling of lymph nodes  -additional unit of PRBC per primary   Dyspnea  L>R Pleural Effusions  No significant parenchymal disease other than pulmonary nodules. PE ruled out.  Hx of smoking.  -lasix 40 mg IV x1  -KVO IVF  -O2 to support sats >90% -hold further ativan / sedating medications   Hyponatremia  Concern malignancy associated  -follow I/O's  -assess urine / serum osmolality   Hypomagnesemia  -monitor, replace as indicated   Lactic Acidosis  Hepatosplenomegaly noted on imaging, likely element of poor clearance.   -trend lactate  -assess LFT's   Pulmonary Nodules  -will need follow up of nodules, suspect related to underlying process that has produced lymphadenopathy   Moderate Protein Calorie Malnutrition  -Ensure BID   Best practice:  Diet: Heart healthy Pain/Anxiety/Delirium protocol (if indicated): n/a  VAP protocol (if indicated): n/a  DVT prophylaxis: SCD's due to pancytopenia  GI prophylaxis: n/a  Glucose control: n/a  Mobility: As tolerated Code Status: Full Code  Family Communication: Patient updated on plan of care.  Disposition: SDU  Labs   CBC: Recent Labs  Lab 08/17/19 1914  WBC 3.4*  NEUTROABS 0.9*  HGB 4.6*  HCT 14.3*  MCV 85.1  PLT 39*    Basic Metabolic Panel: Recent Labs  Lab 08/17/19 1914  NA 126*  K 4.1  CL 92*  CO2 19*  GLUCOSE 78  BUN 10  CREATININE 0.52*  CALCIUM 8.6*   GFR: Estimated  Creatinine Clearance: 105.6 mL/min (A) (by C-G formula based on SCr of 0.52 mg/dL (L)). Recent Labs  Lab 08/17/19 1914 08/17/19 2052 08/18/19 0134 08/18/19 0437  WBC 3.4*  --   --   --   LATICACIDVEN 6.4* 6.8* 6.0* 4.6*    Liver Function Tests: No results for input(s): AST, ALT, ALKPHOS, BILITOT, PROT, ALBUMIN in the last 168 hours. No results for input(s): LIPASE, AMYLASE in the last 168 hours. No results for input(s): AMMONIA in the last 168 hours.  ABG No results found for: PHART, PCO2ART, PO2ART, HCO3, TCO2, ACIDBASEDEF, O2SAT   Coagulation Profile: No results for input(s): INR, PROTIME in the last 168 hours.  Cardiac Enzymes: No results for input(s): CKTOTAL, CKMB, CKMBINDEX, TROPONINI in the last 168 hours.  HbA1C: No results found for: HGBA1C  CBG: No results for input(s): GLUCAP in the last 168 hours.  Review of Systems: Positives in [place]  Gen: Denies fever, chills, weight change, fatigue, night sweats HEENT: Denies blurred  vision, double vision, hearing loss, tinnitus, sinus congestion, rhinorrhea, sore throat, neck stiffness, dysphagia PULM: Denies shortness of breath, cough, sputum production, hemoptysis, wheezing CV: Denies chest pain, edema, orthopnea, paroxysmal nocturnal dyspnea, palpitations GI: Denies abdominal pain, nausea, vomiting, diarrhea, hematochezia, melena, constipation, change in bowel habits GU: Denies dysuria, hematuria, polyuria, oliguria, urethral discharge Endocrine: Denies hot or cold intolerance, polyuria, polyphagia or appetite change Derm: Denies rash, dry skin, scaling or peeling skin change Heme: Denies easy bruising, bleeding, bleeding gums Neuro: Denies headache, numbness, weakness, slurred speech, loss of memory or consciousness   Past Medical History  He,  has a past medical history of Anxiety, Back pain, and GERD (gastroesophageal reflux disease).   Surgical History   History reviewed. No pertinent surgical history.    Social History   reports that he has been smoking cigarettes. He has never used smokeless tobacco. He reports previous alcohol use. He reports previous drug use.   Family History   His family history includes Lymphoma in his father and mother.   Allergies No Known Allergies   Home Medications  Prior to Admission medications   Medication Sig Start Date End Date Taking? Authorizing Provider  hydrOXYzine (ATARAX/VISTARIL) 10 MG tablet Take 10 mg by mouth 3 (three) times daily as needed. 05/31/19  Yes [provider]  ibuprofen (ADVIL) 200 MG tablet Take 200 mg by mouth every 6 (six) hours as needed for moderate pain.   Yes [provider]  omeprazole (PRILOSEC) 20 MG capsule Take 20 mg by mouth daily. 07/23/19  Yes [provider]     Critical care time: n/a    Noe Gens, NP-C [facility] 08/18/2019, 8:06 AM

## 2019-08-18 NOTE — Progress Notes (Signed)
Lower extremity venous has been completed.   Preliminary results in [facility] Proc.   Abram Sander 08/18/2019 8:59 AM

## 2019-08-18 NOTE — Progress Notes (Signed)
  Echocardiogram 2D Echocardiogram was attempted but patient was in CT.   Jennette Dubin 08/18/2019, 12:07 PM

## 2019-08-18 NOTE — Progress Notes (Signed)
Patient is extremely anxious and restless. His HR continues to increase to 140s. Oxygen sats are 98% on [place] 3L. MD ordered one time dose of ativan 1mg  to calm pt. Down. RN will administer and continue to monitor.

## 2019-08-18 NOTE — Consult Note (Addendum)
Isaac Pierce  [phone] [phone]) [phone]   [place]  Referral MD: Dr. Kerney Elbe  Reason for Referral: Bulky lymphadenopathy and pancytopenia  HPI: [name] is a 58 year old male with a past medical history significant for chronic back pain, GERD, anxiety.  He presented to the emergency room with shortness of breath.  He recently moved to [facility] about 5 weeks ago and over the past 5 weeks, he has been feeling very poorly with increasing fatigue, anorexia and weight loss.  His shortness of breath has been progressive.  He also has developed lower extremity edema and swelling in his abdomen.  The patient was tachycardic and tachypneic on presentation.  He also had some intermittent hypotension.  Labs performed in the ER showed a white blood cell count of 3.4, hemoglobin 4.6, platelet count 39,000, ANC 0.9, sodium 126.  Lactic acid level was elevated at 6.4 initially.  A chest x-ray showed mild central vascular congestion with small bilateral effusions, atelectasis or mild pneumonia at the left greater than right lung base.  A CT angiogram of the chest was negative for PE but showed bulky mediastinal, bilateral hilar, and bilateral axillary lymphadenopathy which is concerning for a primary lymphoproliferative process such as lymphoma versus metastatic disease from an unknown primary.  He had multiple bilateral noncalcified pulmonary nodules measuring up to 10 mm, hepatosplenomegaly with an ill-defined area of low density within the peripheral aspect of the spleen measuring 3.7 x 2.8 cm.  The patient was admitted to the ICU stepdown unit due to sepsis.  Was started on IV antibiotics.  He has been given 2 units of PRBCs and his hemoglobin has improved this morning to 7.0.  When seen today, the patient is somewhat sedated since he just received Ativan.  He does awaken and answer some my questions but falls asleep quickly.  The patient  reports fatigue, generalized weakness, anorexia, and weight loss over the past 5 weeks.  He does not know how much weight he has lost.  He has also had night sweats over the past 5 weeks.  He noticed increased lumps in his bilateral axilla and on his neck.  He first noticed these about 5 weeks ago.  He has not had any fevers or chills.  Denies headaches and visual changes.  Denies chest pain.  He reports progressive shortness of breath with a cough productive of white sputum.  No hemoptysis.  Reports abdominal swelling, but no abdominal pain, nausea, vomiting, constipation, diarrhea.  He has had progressive lower extremity swelling.  He has not had any epistaxis, hematemesis, hematuria, melena, hematochezia.  The patient is single.  Does not have any children.  He recently moved back to [place] from [place].  He has a sister who lives locally and he lives near her.  He reports that he smokes, but has not smoked in a while.  Previously, he smoked 1 to 2 packs cigarettes per day.  He could not tell me many years he has smoked.  Denies alcohol use.  Has a history of prior drug use about 20 years ago.  Reports that his mother has been treated for lymphoma.  Medical oncology was asked see the patient today recommendations regarding his bulky lymphadenopathy and pancytopenia.    Past Medical History:  Diagnosis Date  . Anxiety   . Back pain   . GERD (gastroesophageal reflux disease)   :  History reviewed. No pertinent surgical history.:  Current Facility-Administered Medications  Medication Dose Route Frequency Provider Last Rate Last Dose  . 0.9 %  sodium chloride infusion (Manually program via Guardrails IV Fluids)   Intravenous Once [place], Isaac Latif, DO      . acetaminophen (TYLENOL) tablet 650 mg  650 mg Oral Q6H PRN Isaac Leff, MD   650 mg at 08/18/19 0104   Or  . acetaminophen (TYLENOL) suppository 650 mg  650 mg Rectal Q6H PRN Isaac Leff, MD      . Chlorhexidine  Gluconate Cloth 2 % PADS 6 each  6 each Topical Daily Isaac Leff, MD      . feeding supplement (ENSURE ENLIVE) (ENSURE ENLIVE) liquid 237 mL  237 mL Oral BID BM Isaac Pierce, Isaac Latif, DO   237 mL at 08/18/19 1037  . feeding supplement (PRO-STAT SUGAR FREE 64) liquid 30 mL  30 mL Oral BID Isaac Pierce, Isaac Latif, DO   30 mL at 08/18/19 1038  . furosemide (LASIX) injection 20 mg  20 mg Intravenous Once Isaac Pierce, Isaac Latif, DO      . hydrocortisone cream 1 %   Topical BID Isaac Leff, MD      . hydrOXYzine (ATARAX/VISTARIL) tablet 10 mg  10 mg Oral TID PRN Isaac Leff, MD   10 mg at 08/18/19 0302  . iohexol (OMNIPAQUE) 9 MG/ML oral solution           . ipratropium (ATROVENT) nebulizer solution 0.5 mg  0.5 mg Nebulization Q6H Isaac Pierce, Isaac Pierce [place], DO   0.5 mg at 08/18/19 1275  . levalbuterol (XOPENEX) nebulizer solution 0.63 mg  0.63 mg Nebulization Q6H Isaac Pierce, Isaac Pierce [place], DO   0.63 mg at 08/18/19 1700  . magnesium sulfate IVPB 2 g 50 mL  2 g Intravenous Once Isaac Pierce Latif, DO 50 mL/hr at 08/18/19 1034 2 g at 08/18/19 1034  . MEDLINE mouth rinse  15 mL Mouth Rinse BID Isaac Leff, MD   15 mL at 08/18/19 1038  . multivitamin with minerals tablet 1 tablet  1 tablet Oral Daily Isaac Pierce [place], DO   1 tablet at 08/18/19 1038  . nicotine (NICODERM CQ - dosed in mg/24 hr) patch 7 mg  7 mg Transdermal Daily Isaac Leff, MD      . pantoprazole (PROTONIX) EC tablet 20 mg  20 mg Oral Daily Isaac Leff, MD      . piperacillin-tazobactam (ZOSYN) IVPB 3.375 g  3.375 g Intravenous Isaac Burow, MD   Stopped at 08/18/19 0915  . sodium chloride 0.9 % bolus 500 mL  500 mL Intravenous Once Isaac Pierce, Isaac Latif, DO      . vancomycin (VANCOCIN) 1,250 mg in sodium chloride 0.9 % 250 mL IVPB  1,250 mg Intravenous Q12H Isaac Leff, MD         No Known Allergies:  Family History  Problem Relation Age of Onset  . Lymphoma Mother   . Lymphoma Father    :  Social History   Socioeconomic History  . Marital status: Single    Spouse name: Not on file  . Number of children: Not on file  . Years of education: Not on file  . Highest education level: Not on file  Occupational History  . Not on file  Social Needs  . Financial resource strain: Not on file  . Food insecurity    Worry: Not on file    Inability: Not on file  . Transportation needs    Medical: Not on file    Non-medical: Not on file  Tobacco Use  . Smoking status: Current Every Day Smoker    Types: Cigarettes  . Smokeless tobacco: Never Used  Substance and Sexual Activity  . Alcohol use: Not Currently  . Drug use: Not Currently  . Sexual activity: Not on file  Lifestyle  . Physical activity    Days per week: Not on file    Minutes per session: Not on file  . Stress: Not on file  Relationships  . Social Herbalist on phone: Not on file    Gets together: Not on file    Attends religious service: Not on file    Active member of club or organization: Not on file    Attends meetings of clubs or organizations: Not on file    Relationship status: Not on file  . Intimate partner violence    Fear of current or ex partner: Not on file    Emotionally abused: Not on file    Physically abused: Not on file    Forced sexual activity: Not on file  Other Topics Concern  . Not on file  Social History Narrative  . Not on file  :  Review of Systems: A comprehensive 14 point review of systems was negative except as noted in the HPI.  Exam: Patient Vitals for the past 24 hrs:  BP Temp Temp src Pulse Resp SpO2 Height Weight  08/18/19 0825 - - - - - 96 % - -  08/18/19 0800 (!) 136/53 99.8 F (37.7 C) Oral (!) 127 (!) 29 98 % - -  08/18/19 0745 - - - (!) 130 (!) 28 98 % - -  08/18/19 0657 - - - (!) 128 (!) 27 100 % - -  08/18/19 0633 - - - (!) 139 (!) 28 98 % - -  08/18/19 0622 - - - (!) 128 (!) 27 98 % - -  08/18/19 0612 - - - (!) 126 (!) 27 98 % - -   08/18/19 0605 - - - (!) 129 (!) 28 98 % - -  08/18/19 0604 - - - (!) 129 (!) 28 98 % - -  08/18/19 0600 (!) 162/76 - - (!) 131 (!) 31 95 % - -  08/18/19 0552 - - - (!) 129 (!) 33 94 % '5\' 11"'$  (1.803 m) -  08/18/19 0551 - - - (!) 130 (!) 32 95 % - -  08/18/19 0546 (!) 154/78 - - (!) 128 (!) 32 96 % - -  08/18/19 0530 (!) 126/54 98.2 F (36.8 C) Oral (!) 117 20 91 % - -  08/18/19 0515 - - - (!) 115 (!) 21 93 % - -  08/18/19 0500 - - - (!) 116 (!) 22 93 % - -  08/18/19 0445 - - - (!) 116 (!) 24 94 % - -  08/18/19 0430 - - - (!) 115 (!) 29 96 % - -  08/18/19 0415 - - - (!) 117 (!) 28 95 % - -  08/18/19 0400 (!) 112/56 98.2 F (36.8 C) Oral (!) 113 20 99 % - 159 lb 9.8 oz (72.4 kg)  08/18/19 0345 114/69 - - (!) 111 (!) 21 98 % - -  08/18/19 0330 (!) 120/44 - - (!) 118 (!) 24 98 % - -  08/18/19 0315 124/64 98.2 F (36.8 C) Oral (!) 114 (!) 24 95 % - -  08/18/19 0300 117/66 98.4 F (36.9 C) Oral (!) 116 20 95 % - -  08/18/19 0245 - - - (!) 120 (!) 26 94 % - -  08/18/19 0230 - - - (!) 116 (!) 27 96 % - -  08/18/19 0226 122/64 98.2 F (36.8 C) Oral (!) 120 (!) 24 98 % - -  08/18/19 0215 - - - (!) 121 (!) 22 98 % - -  08/18/19 0200 127/63 - - (!) 124 (!) 23 97 % - -  08/18/19 0145 - - - (!) 128 (!) 24 98 % - -  08/18/19 0130 - - - (!) 124 (!) 28 97 % - -  08/18/19 0115 - - - (!) 129 (!) 33 97 % - -  08/18/19 0107 - - - - - - - 159 lb 9.8 oz (72.4 kg)  08/18/19 0100 (!) 124/52 98.7 F (37.1 C) Oral (!) 129 (!) 30 97 % - -  08/18/19 0045 - - - (!) 127 (!) 26 97 % - -  08/18/19 0030 - - - (!) 125 (!) 30 97 % - -  08/17/19 2315 - - - (!) 121 (!) 23 100 % - -  08/17/19 2307 (!) 114/48 98.2 F (36.8 C) Oral (!) 123 (!) 24 99 % - -  08/17/19 2300 (!) 114/49 - - (!) 121 (!) 24 99 % - -  08/17/19 2249 (!) 101/45 98.1 F (36.7 C) Oral (!) 123 (!) 26 100 % - -  08/17/19 2232 (!) 96/38 - - - - - - -  08/17/19 2226 (!) 93/46 - - (!) 125 19 97 % - -  08/17/19 2130 118/63 - - (!) 124 (!) 24 100 %  - -  08/17/19 2100 - - - - - - - 155 lb 8 oz (70.5 kg)  08/17/19 2100 (!) 117/55 - - (!) 121 (!) 26 98 % - -  08/17/19 2000 113/61 - - (!) 118 (!) 25 99 % - -  08/17/19 1943 (!) 95/43 - - (!) 114 (!) 26 - - -  08/17/19 1900 (!) 114/46 - - (!) 114 (!) 23 96 % - -  08/17/19 1845 - - - (!) 117 (!) 24 99 % - -  08/17/19 1833 107/70 - - (!) 117 16 100 % - -  08/17/19 1830 107/70 - - (!) 115 20 100 % - -  08/17/19 1814 115/63 98.1 F (36.7 C) - (!) 113 (!) 23 100 % - -    General: Chronically ill-appearing male no acute distress.   Eyes:  no scleral icterus.   ENT:  There were no oropharyngeal lesions.   Neck was without thyromegaly.   Lymphatics: Palpable cervical lymph nodes bilaterally, left axillary lymph node measuring approximately 2 cm, right axillary lymph node palpable, palpable inguinal lymph nodes Respiratory: lungs were clear bilaterally without wheezing or crackles.   Cardiovascular: Tachycardic with heart rate in the 120s, S1/S2, without murmur, rub or gallop.  3+ bilateral extremity pitting edema up to the knees GI: Positive bowel sounds, mild distention, splenomegaly noted  Skin exam was without echymosis, petichae.   Neuro exam was nonfocal. Patient was alert and oriented, but fell asleep intermittently during my visit with him.   Lab Results  Component Value Date   WBC 3.8 (L) 08/18/2019   HGB 7.0 (L) 08/18/2019   HCT 21.3 (L) 08/18/2019   PLT 42 (L) 08/18/2019   GLUCOSE 89 08/18/2019   ALT 25 08/18/2019   ALT 24 08/18/2019   AST 67 (H) 08/18/2019   AST 69 (H) 08/18/2019  NA 128 (L) 08/18/2019   K 3.9 08/18/2019   CL 96 (L) 08/18/2019   CREATININE 0.53 (L) 08/18/2019   BUN 11 08/18/2019   CO2 21 (L) 08/18/2019    Dg Chest 2 View  Result Date: 08/17/2019 CLINICAL DATA:  Shortness of breath EXAM: CHEST - 2 VIEW COMPARISON:  None. FINDINGS: Small bilateral pleural effusions. Mild basilar airspace disease. Borderline heart size with mild central vascular  congestion. No pneumothorax. IMPRESSION: Mild central vascular congestion with small bilateral effusions. Atelectasis or mild pneumonia at the left greater than right lung base. Electronically Signed   By: Isaac Foil M.D.   On: 08/17/2019 19:57   Ct Angio Chest Pe W/cm &/or Wo Cm  Result Date: 08/17/2019 CLINICAL DATA:  Shortness of breath for 1 month EXAM: CT ANGIOGRAPHY CHEST WITH CONTRAST TECHNIQUE: Multidetector CT imaging of the chest was performed using the standard protocol during bolus administration of intravenous contrast. Multiplanar CT image reconstructions and MIPs were obtained to evaluate the vascular anatomy. CONTRAST:  169m OMNIPAQUE IOHEXOL 350 MG/ML SOLN COMPARISON:  Chest x-ray 08/17/2019 FINDINGS: Cardiovascular: Suboptimal contrast bolus timing and respiratory motion artifact degraded examination of the more distal pulmonary arterial tree. No filling defect within the main or lobar branch pulmonary arteries. No evidence of right heart strain. Heart size is normal. There is pericardial thickening. Thoracic aorta is normal in course and caliber. Mediastinum/Nodes: Bulky mediastinal, bilateral hilar, and bilateral axillary lymphadenopathy. Thyroid, trachea, and esophagus are grossly unremarkable. Lungs/Pleura: Small bilateral pleural effusions, left greater than right with associated compressive atelectasis. There are numerous bilateral noncalcified pulmonary nodules. Reference nodules include 10 mm right lower lobe juxtapleural nodule (series 6, image 88). 9 mm medial right lower lobe nodule (series 6, image 106). 7 mm anterior right middle lobe nodule (series 6, image 107). 7 mm left lower lobe nodule (series 6, image 90). 8 mm lingular nodule (series 6, image 92). No pneumothorax. Upper Abdomen: Hepatosplenomegaly within the visualized upper abdomen. Ill-defined area of low density within the peripheral aspect of the spleen measuring approximately 3.7 x 2.8 cm (series 4, image 135).  Musculoskeletal: No chest wall abnormality. No acute or significant osseous findings. Review of the MIP images confirms the above findings. IMPRESSION: 1. Limited exam. No filling defect to the lobar branch level to suggest pulmonary embolism. 2. Bulky mediastinal, bilateral hilar, and bilateral axillary lymphadenopathy. Findings concerning for primary lymphoproliferative process such as lymphoma. Metastatic disease secondary to unknown primary is also a consideration. 3. Multiple bilateral noncalcified pulmonary nodules measuring up to 10 mm, likely a result of the same process as listed above. 4. Small bilateral pleural effusions, left greater than right with associated compressive atelectasis. 5. Hepatosplenomegaly within the visualized upper abdomen with ill-defined area of low density within the peripheral aspect of the spleen measuring 3.7 x 2.8 cm. Findings are concerning for lymphomatous involvement versus metastatic disease. Alternatively, sequela of splenic injury could have a similar appearance. These results were called by telephone at the time of interpretation on 08/17/2019 at 8:49 pm to provider Isaac Pierce , who verbally acknowledged these results. Electronically Signed   By: Isaac PokeM.D.   On: 08/17/2019 20:51   Dg Chest Port 1 View  Result Date: 08/18/2019 CLINICAL DATA:  Dyspnea. EXAM: PORTABLE CHEST 1 VIEW COMPARISON:  August 17, 2019. FINDINGS: Stable cardiomediastinal silhouette. No pneumothorax is noted. Mild bibasilar atelectasis or edema is noted with probable small pleural effusions. Bony thorax is unremarkable. IMPRESSION: Mild bibasilar atelectasis or edema is noted  with probable small pleural effusions. Electronically Signed   By: Isaac Pierce M.D.   On: 08/18/2019 08:27   Vas [place] Lower Extremity Venous (dvt)  Result Date: 08/18/2019  Lower Venous Study Indications: Elevated d dimer.  Comparison Study: no prior Performing Technologist: Isaac Pierce   Examination Guidelines: A complete evaluation includes B-mode imaging, spectral Doppler, color Doppler, and power Doppler as needed of all accessible portions of each vessel. Bilateral testing is considered an integral part of a complete examination. Limited examinations for reoccurring indications may be performed as noted.  +---------+---------------+---------+-----------+----------+--------------+ RIGHT    CompressibilityPhasicitySpontaneityPropertiesThrombus Aging +---------+---------------+---------+-----------+----------+--------------+ CFV      Full           Yes      Yes                                 +---------+---------------+---------+-----------+----------+--------------+ SFJ      Full                                                        +---------+---------------+---------+-----------+----------+--------------+ FV Prox  Full                                                        +---------+---------------+---------+-----------+----------+--------------+ FV Mid   Full                                                        +---------+---------------+---------+-----------+----------+--------------+ FV DistalFull                                                        +---------+---------------+---------+-----------+----------+--------------+ PFV      Full                                                        +---------+---------------+---------+-----------+----------+--------------+ POP      Full           Yes      Yes                                 +---------+---------------+---------+-----------+----------+--------------+ PTV      Full                                                        +---------+---------------+---------+-----------+----------+--------------+ PERO     Full                                                        +---------+---------------+---------+-----------+----------+--------------+    +---------+---------------+---------+-----------+----------+--------------+  LEFT     CompressibilityPhasicitySpontaneityPropertiesThrombus Aging +---------+---------------+---------+-----------+----------+--------------+ CFV      Full           Yes      Yes                                 +---------+---------------+---------+-----------+----------+--------------+ SFJ      Full                                                        +---------+---------------+---------+-----------+----------+--------------+ FV Prox  Full                                                        +---------+---------------+---------+-----------+----------+--------------+ FV Mid   Full                                                        +---------+---------------+---------+-----------+----------+--------------+ FV DistalFull                                                        +---------+---------------+---------+-----------+----------+--------------+ PFV      Full                                                        +---------+---------------+---------+-----------+----------+--------------+ POP      Full           Yes      Yes                                 +---------+---------------+---------+-----------+----------+--------------+ PTV      Full                                                        +---------+---------------+---------+-----------+----------+--------------+ PERO     Full                                                        +---------+---------------+---------+-----------+----------+--------------+     Summary: Right: There is no evidence of deep vein thrombosis in the lower extremity. No cystic structure found in the popliteal fossa. Left: There is no evidence of deep vein thrombosis in the lower extremity. No cystic structure found in the popliteal fossa.  *  See table(s) above for measurements and observations.    Preliminary      Dg Chest  2 View  Result Date: 08/17/2019 CLINICAL DATA:  Shortness of breath EXAM: CHEST - 2 VIEW COMPARISON:  None. FINDINGS: Small bilateral pleural effusions. Mild basilar airspace disease. Borderline heart size with mild central vascular congestion. No pneumothorax. IMPRESSION: Mild central vascular congestion with small bilateral effusions. Atelectasis or mild pneumonia at the left greater than right lung base. Electronically Signed   By: Isaac Foil M.D.   On: 08/17/2019 19:57   Ct Angio Chest Pe W/cm &/or Wo Cm  Result Date: 08/17/2019 CLINICAL DATA:  Shortness of breath for 1 month EXAM: CT ANGIOGRAPHY CHEST WITH CONTRAST TECHNIQUE: Multidetector CT imaging of the chest was performed using the standard protocol during bolus administration of intravenous contrast. Multiplanar CT image reconstructions and MIPs were obtained to evaluate the vascular anatomy. CONTRAST:  122m OMNIPAQUE IOHEXOL 350 MG/ML SOLN COMPARISON:  Chest x-ray 08/17/2019 FINDINGS: Cardiovascular: Suboptimal contrast bolus timing and respiratory motion artifact degraded examination of the more distal pulmonary arterial tree. No filling defect within the main or lobar branch pulmonary arteries. No evidence of right heart strain. Heart size is normal. There is pericardial thickening. Thoracic aorta is normal in course and caliber. Mediastinum/Nodes: Bulky mediastinal, bilateral hilar, and bilateral axillary lymphadenopathy. Thyroid, trachea, and esophagus are grossly unremarkable. Lungs/Pleura: Small bilateral pleural effusions, left greater than right with associated compressive atelectasis. There are numerous bilateral noncalcified pulmonary nodules. Reference nodules include 10 mm right lower lobe juxtapleural nodule (series 6, image 88). 9 mm medial right lower lobe nodule (series 6, image 106). 7 mm anterior right middle lobe nodule (series 6, image 107). 7 mm left lower lobe nodule (series 6, image 90). 8 mm lingular nodule (series 6,  image 92). No pneumothorax. Upper Abdomen: Hepatosplenomegaly within the visualized upper abdomen. Ill-defined area of low density within the peripheral aspect of the spleen measuring approximately 3.7 x 2.8 cm (series 4, image 135). Musculoskeletal: No chest wall abnormality. No acute or significant osseous findings. Review of the MIP images confirms the above findings. IMPRESSION: 1. Limited exam. No filling defect to the lobar branch level to suggest pulmonary embolism. 2. Bulky mediastinal, bilateral hilar, and bilateral axillary lymphadenopathy. Findings concerning for primary lymphoproliferative process such as lymphoma. Metastatic disease secondary to unknown primary is also a consideration. 3. Multiple bilateral noncalcified pulmonary nodules measuring up to 10 mm, likely a result of the same process as listed above. 4. Small bilateral pleural effusions, left greater than right with associated compressive atelectasis. 5. Hepatosplenomegaly within the visualized upper abdomen with ill-defined area of low density within the peripheral aspect of the spleen measuring 3.7 x 2.8 cm. Findings are concerning for lymphomatous involvement versus metastatic disease. Alternatively, sequela of splenic injury could have a similar appearance. These results were called by telephone at the time of interpretation on 08/17/2019 at 8:49 pm to provider Isaac Pierce , who verbally acknowledged these results. Electronically Signed   By: Isaac PokeM.D.   On: 08/17/2019 20:51   Dg Chest Port 1 View  Result Date: 08/18/2019 CLINICAL DATA:  Dyspnea. EXAM: PORTABLE CHEST 1 VIEW COMPARISON:  August 17, 2019. FINDINGS: Stable cardiomediastinal silhouette. No pneumothorax is noted. Mild bibasilar atelectasis or edema is noted with probable small pleural effusions. Bony thorax is unremarkable. IMPRESSION: Mild bibasilar atelectasis or edema is noted with probable small pleural effusions. Electronically Signed   By: Isaac ConceptionM.D.   On:  08/18/2019 08:27   Vas [place] Lower Extremity Venous (dvt)  Result Date: 08/18/2019  Lower Venous Study Indications: Elevated d dimer.  Comparison Study: no prior Performing Technologist: Isaac Pierce  Examination Guidelines: A complete evaluation includes B-mode imaging, spectral Doppler, color Doppler, and power Doppler as needed of all accessible portions of each vessel. Bilateral testing is considered an integral part of a complete examination. Limited examinations for reoccurring indications may be performed as noted.  +---------+---------------+---------+-----------+----------+--------------+ RIGHT    CompressibilityPhasicitySpontaneityPropertiesThrombus Aging +---------+---------------+---------+-----------+----------+--------------+ CFV      Full           Yes      Yes                                 +---------+---------------+---------+-----------+----------+--------------+ SFJ      Full                                                        +---------+---------------+---------+-----------+----------+--------------+ FV Prox  Full                                                        +---------+---------------+---------+-----------+----------+--------------+ FV Mid   Full                                                        +---------+---------------+---------+-----------+----------+--------------+ FV DistalFull                                                        +---------+---------------+---------+-----------+----------+--------------+ PFV      Full                                                        +---------+---------------+---------+-----------+----------+--------------+ POP      Full           Yes      Yes                                 +---------+---------------+---------+-----------+----------+--------------+ PTV      Full                                                         +---------+---------------+---------+-----------+----------+--------------+ PERO     Full                                                        +---------+---------------+---------+-----------+----------+--------------+   +---------+---------------+---------+-----------+----------+--------------+  LEFT     CompressibilityPhasicitySpontaneityPropertiesThrombus Aging +---------+---------------+---------+-----------+----------+--------------+ CFV      Full           Yes      Yes                                 +---------+---------------+---------+-----------+----------+--------------+ SFJ      Full                                                        +---------+---------------+---------+-----------+----------+--------------+ FV Prox  Full                                                        +---------+---------------+---------+-----------+----------+--------------+ FV Mid   Full                                                        +---------+---------------+---------+-----------+----------+--------------+ FV DistalFull                                                        +---------+---------------+---------+-----------+----------+--------------+ PFV      Full                                                        +---------+---------------+---------+-----------+----------+--------------+ POP      Full           Yes      Yes                                 +---------+---------------+---------+-----------+----------+--------------+ PTV      Full                                                        +---------+---------------+---------+-----------+----------+--------------+ PERO     Full                                                        +---------+---------------+---------+-----------+----------+--------------+     Summary: Right: There is no evidence of deep vein thrombosis in the lower extremity. No cystic structure found in the  popliteal fossa. Left: There is no evidence of deep vein thrombosis in the lower extremity. No cystic structure found in the popliteal fossa.  *  See table(s) above for measurements and observations.    Preliminary    Assessment and Plan:  1.  Bulky lymphadenopathy highly suspicious for lymphoproliferative disorder versus metastatic disease 2.  Pancytopenia 3.  Lactic acidosis/sepsis 4.  Dyspnea secondary to left greater than right pleural effusions 5.  Hyponatremia 6.  Anxiety 7.  Chronic back pain 8.  Tobacco dependence  -The patient has bulky lymphadenopathy with fatigue, anorexia, weight loss, and night sweats.  He has an elevated LDH. This is concerning for a lymphoproliferative disorder such as lymphoma.  Cannot rule out metastatic disease from unknown primary.  Recommend a CT of the abdomen pelvis with contrast to evaluate for malignancy/lymphadenopathy.  He will need an ultrasound-guided biopsy of one of his enlarged lymph nodes.  I have requested an ultrasound-guided left axillary lymph node biopsy by interventional radiology.  Further recommendations pending this work-up. -The patient has pancytopenia on his lab work which could be related to a lymphoproliferative disorder.  Iron studies and vitamin B12 level are not decreased.  Folate mildly decreased.  I have started him on folic acid 1 mg daily.  We may need to consider getting a bone marrow biopsy during this hospital admission.  Recommend PRBC transfusion for hemoglobin less than 8.  He is receiving another unit today.  Recommend platelet transfusion if platelets drop below 20,000 or active bleeding.  He does not need a platelet transfusion today. -Lactic acid remains elevated but is trending down.  Management per hospitalist and PCCM. -Hyponatremia thought to be due to dehydration.  Slowly improving with IV fluids.  No work-up is pending per hospitalist. -He was counseled about smoking cessation.  Thank you for this referral.    Isaac Bussing, DNP, AGPCNP-BC, AOCNP  ADDENDUM: Hematology/Oncology Attending: I had a face-to-face encounter with the patient today.  I agree with the above note and recommended his care plan.  This is a very pleasant 58 years old white male who presented to the emergency department yesterday complaining of increasing fatigue and weakness as well as shortness of breath.  He just moved recently from [place] to [place] few weeks ago.  His blood work showed significant pancytopenia with hemoglobin down to 4.6 and platelets count 39,000.  CT angiogram of the chest performed at the time of the admission showed bulky mediastinal, bilateral hilar and bilateral axillary lymphadenopathy and the finding were concerning for primary lymphoproliferative process such as lymphoma but metastatic disease secondary to unknown primary could be also a consideration.  The patient had multiple bilateral noncalcified pulmonary nodules measuring up to 1.0 cm likely result of the same process.  There was also small bilateral pleural effusion left greater than right with associated compressive atelectasis.  The patient also has hepatosplenomegaly within the visualized upper abdomen with ill-defined area of low density within the posterior aspect of the spleen measuring 3.7 x 2.8 cm.  These findings are also concerning for lymphomatous involvement versus metastatic disease. The patient was given 3 units of PRBCs over the last 24 hours with improvement of his hemoglobin to 7.0 and hematocrit 21.3%. The patient is also currently being treated for sepsis. CT of the abdomen and pelvis performed earlier today showed the massive splenomegaly with abdominal and inguinal lymphadenopathy.  There was evidence for multiple splenic infarcts likely related to the splenomegaly in addition to redemonstration of the pleural-based nodularity at the lung bases. He had ultrasound-guided biopsy of a right inguinal lymph node by  interventional radiology.  The final pathology is still pending.  When seen today the patient is sleepy after taking a lot of Ativan earlier today. I discussed with him his current condition and recommended for him to have a bone marrow biopsy and aspirate tomorrow to rule out any involvement of the bone marrow with lymphoma. Once we have the final diagnosis, I will discuss with the patient his treatment options in more details. Thank you so much for allowing me to participate in the care of Mr. Fickle.  I will continue to follow observation with you and assist in his management.  Disclaimer: This note was dictated with voice recognition software. Similar sounding words can inadvertently be transcribed and may be missed upon review. Eilleen Kempf, MD

## 2019-08-18 NOTE — Progress Notes (Signed)
Patient is having difficulty breathing and his HR is 120-130's sustainded; MD paged via [place]. RN ill continue to monitor.

## 2019-08-18 NOTE — Progress Notes (Signed)
Initial Nutrition Assessment  RD working remotely.  DOCUMENTATION CODES:   Not applicable  INTERVENTION:   - Ensure Enlive po BID, each supplement provides 350 kcal and 20 grams of protein  - Pro-stat 30 ml po BID, each supplement provides 100 kcal and 15 grams of protein  - MVI with minerals daily  - Encourage adequate PO intake  NUTRITION DIAGNOSIS:   Increased nutrient needs related to acute illness as evidenced by estimated needs.  GOAL:   Patient will meet greater than or equal to 90% of their needs  MONITOR:   PO intake, Supplement acceptance, Labs, Weight trends, I & O's  REASON FOR ASSESSMENT:   Consult Assessment of nutrition requirement/status  ASSESSMENT:   58 year old male who presented to the ED on 11/17 with SOB and BLE edema. Pt was recently homeless until a couple of weeks ago. CTA with concern for lymphoma to multiple sites. Pt admitted with severe sepsis.   Oncology has been consulted.  Unable to reach pt via phone. No weight history available in chart. RD will attempt to obtain detailed diet and weight history upon follow-up.  Per H&P, pt is presenting with a 5-week history of dyspnea, night sweats, anorexia, and significant weight loss. Pt is at risk for malnutrition and may already meet criteria for malnutrition but RD unable to confirm without history and/or NFPE.  Per RN edema assessment, pt with deep pitting edema to BLE. It is likely that edema is falsely elevating pt's weight. Difficult to determine dry weight at this time.  RD will order oral nutrition supplements to aid pt in meeting kcal and protein needs. Will also order daily MVI.  No meal completions documented at this time.  Medications reviewed and include: Protonix, IV abx IVF: NS @ 100 ml/hr  Labs reviewed: sodium 128, chloride 96, magnesium 1.5, hemoglobin 7.0  NUTRITION - FOCUSED PHYSICAL EXAM:  Unable to complete at this time. RD working remotely.  Diet Order:   Diet  Order            Diet Heart Room service appropriate? Yes; Fluid consistency: Thin  Diet effective now              EDUCATION NEEDS:   No education needs have been identified at this time  Skin:  Skin Assessment: Reviewed RN Assessment  Last BM:  no documented BM  Height:   Ht Readings from Last 1 Encounters:  08/18/19 5\' 11"  (1.803 m)    Weight:   Wt Readings from Last 1 Encounters:  08/18/19 72.4 kg    Ideal Body Weight:  78.2 kg  BMI:  Body mass index is 22.26 kg/m.  Estimated Nutritional Needs:   Kcal:  2100-2300  Protein:  100-115 grams  Fluid:  >/= 2.0 L    Gaynell Face, MS, RD, LDN Inpatient Clinical Dietitian Pager: [phone] Weekend/After Hours: [phone]

## 2019-08-19 ENCOUNTER — Inpatient Hospital Stay (HOSPITAL_COMMUNITY): Payer: Medicaid Other

## 2019-08-19 ENCOUNTER — Encounter (HOSPITAL_COMMUNITY): Payer: Self-pay | Admitting: Physician Assistant

## 2019-08-19 DIAGNOSIS — D479 Neoplasm of uncertain behavior of lymphoid, hematopoietic and related tissue, unspecified: Secondary | ICD-10-CM

## 2019-08-19 DIAGNOSIS — E43 Unspecified severe protein-calorie malnutrition: Secondary | ICD-10-CM | POA: Insufficient documentation

## 2019-08-19 LAB — COMPREHENSIVE METABOLIC PANEL
ALT: 25 U/L (ref 0–44)
AST: 67 U/L — ABNORMAL HIGH (ref 15–41)
Albumin: 1.8 g/dL — ABNORMAL LOW (ref 3.5–5.0)
Alkaline Phosphatase: 147 U/L — ABNORMAL HIGH (ref 38–126)
Anion gap: 13 (ref 5–15)
BUN: 12 mg/dL (ref 6–20)
CO2: 22 mmol/L (ref 22–32)
Calcium: 8.3 mg/dL — ABNORMAL LOW (ref 8.9–10.3)
Chloride: 92 mmol/L — ABNORMAL LOW (ref 98–111)
Creatinine, Ser: 0.68 mg/dL (ref 0.61–1.24)
GFR calc Af Amer: >60 mL/min (ref >60)
GFR calc non Af Amer: >60 mL/min (ref >60)
Glucose, Bld: 118 mg/dL — ABNORMAL HIGH (ref 70–99)
Potassium: 3.6 mmol/L (ref 3.5–5.1)
Sodium: 127 mmol/L — ABNORMAL LOW (ref 135–145)
Total Bilirubin: 1.3 mg/dL — ABNORMAL HIGH (ref 0.3–1.2)
Total Protein: 4.3 g/dL — ABNORMAL LOW (ref 6.5–8.1)

## 2019-08-19 LAB — URINE CULTURE: Culture: NO GROWTH

## 2019-08-19 LAB — CBC WITH DIFFERENTIAL/PLATELET
Abs Immature Granulocytes: 0.07 10*3/uL (ref 0.00–0.07)
Basophils Absolute: 0 10*3/uL (ref 0.0–0.1)
Basophils Relative: 1 %
Eosinophils Absolute: 0 10*3/uL (ref 0.0–0.5)
Eosinophils Relative: 0 %
HCT: 21.7 % — ABNORMAL LOW (ref 39.0–52.0)
Hemoglobin: 7.2 g/dL — ABNORMAL LOW (ref 13.0–17.0)
Immature Granulocytes: 2 %
Lymphocytes Relative: 47 %
Lymphs Abs: 1.8 10*3/uL (ref 0.7–4.0)
MCH: 28.1 pg (ref 26.0–34.0)
MCHC: 33.2 g/dL (ref 30.0–36.0)
MCV: 84.8 fL (ref 80.0–100.0)
Monocytes Absolute: 1 10*3/uL (ref 0.1–1.0)
Monocytes Relative: 25 %
Neutro Abs: 1 10*3/uL — ABNORMAL LOW (ref 1.7–7.7)
Neutrophils Relative %: 25 %
Platelets: 47 10*3/uL — ABNORMAL LOW (ref 150–400)
RBC: 2.56 MIL/uL — ABNORMAL LOW (ref 4.22–5.81)
RDW: 23.1 % — ABNORMAL HIGH (ref 11.5–15.5)
WBC: 3.8 10*3/uL — ABNORMAL LOW (ref 4.0–10.5)
nRBC: 0 % (ref 0.0–0.2)

## 2019-08-19 LAB — PROCALCITONIN: Procalcitonin: 3 ng/mL

## 2019-08-19 LAB — CULTURE, BLOOD (ROUTINE X 2): Special Requests: ADEQUATE

## 2019-08-19 LAB — MAGNESIUM: Magnesium: 1.5 mg/dL — ABNORMAL LOW (ref 1.7–2.4)

## 2019-08-19 LAB — PHOSPHORUS: Phosphorus: 3 mg/dL (ref 2.5–4.6)

## 2019-08-19 LAB — HIV ANTIBODY (ROUTINE TESTING W REFLEX): HIV Screen 4th Generation wRfx: NONREACTIVE — AB

## 2019-08-19 LAB — PREPARE RBC (CROSSMATCH)

## 2019-08-19 LAB — HEMOGLOBIN AND HEMATOCRIT, BLOOD
HCT: 26.2 % — ABNORMAL LOW (ref 39.0–52.0)
Hemoglobin: 8.5 g/dL — ABNORMAL LOW (ref 13.0–17.0)

## 2019-08-19 MED ORDER — FUROSEMIDE 10 MG/ML IJ SOLN
40.0000 mg | Freq: Once | INTRAMUSCULAR | Status: AC
  Administered 2019-08-19: 40 mg via INTRAVENOUS
  Filled 2019-08-19: qty 4

## 2019-08-19 MED ORDER — MIDAZOLAM HCL 2 MG/2ML IJ SOLN
INTRAMUSCULAR | Status: AC | PRN
  Administered 2019-08-19: 2 mg via INTRAVENOUS

## 2019-08-19 MED ORDER — IBUPROFEN 100 MG/5ML PO SUSP
600.0000 mg | Freq: Once | ORAL | Status: AC
  Administered 2019-08-19: 600 mg via ORAL
  Filled 2019-08-19: qty 30

## 2019-08-19 MED ORDER — POTASSIUM CHLORIDE CRYS ER 20 MEQ PO TBCR
40.0000 meq | EXTENDED_RELEASE_TABLET | Freq: Once | ORAL | Status: AC
  Administered 2019-08-19: 40 meq via ORAL
  Filled 2019-08-19: qty 2

## 2019-08-19 MED ORDER — SODIUM CHLORIDE 0.9% IV SOLUTION: Freq: Once | INTRAVENOUS | Status: DC

## 2019-08-19 MED ORDER — MIDAZOLAM HCL 2 MG/2ML IJ SOLN
INTRAMUSCULAR | Status: AC
  Administered 2019-08-19: 10:00:00
  Filled 2019-08-19: qty 2

## 2019-08-19 MED ORDER — MAGNESIUM SULFATE 2 GM/50ML IV SOLN
2.0000 g | Freq: Once | INTRAVENOUS | Status: AC
  Administered 2019-08-19: 2 g via INTRAVENOUS
  Filled 2019-08-19: qty 50

## 2019-08-19 MED ORDER — MIDAZOLAM HCL 2 MG/2ML IJ SOLN
INTRAMUSCULAR | Status: AC
  Filled 2019-08-19: qty 2

## 2019-08-19 NOTE — Procedures (Signed)
Interventional Radiology Procedure Note  Procedure: CT guided aspirate and core biopsy of right iliac bone Complications: None Recommendations: - Bedrest supine x 1 hrs - Hydrocodone PRN  Pain - Follow biopsy results  Signed,  Heath K. McCullough, MD   

## 2019-08-19 NOTE — Progress Notes (Signed)
Referring Physician(s): [name]  Supervising Physician: Jacqulynn Cadet  Patient Status:  [facility] - In-pt  Chief Complaint:  Pancytopenia  HPI:  Isaac Pierce is known to our service.  He underwent lymph node biopsy yesterday by Dr. Pascal Lux.  Today we are asked to perform a bone marrow biopsy to evaluate his pancytopenia.  On admission WBC was 3.4, Hgb 4.6, platelets 39.  He is currently not in a very good as he says "I haven't eaten in days, you have no idea how hungry I am".    Allergies: Patient has no known allergies.  Medications: Prior to Admission medications   Medication Sig Start Date End Date Taking? Authorizing Provider  hydrOXYzine (ATARAX/VISTARIL) 10 MG tablet Take 10 mg by mouth 3 (three) times daily as needed. 05/31/19  Yes [provider]  ibuprofen (ADVIL) 200 MG tablet Take 200 mg by mouth every 6 (six) hours as needed for moderate pain.   Yes [provider]  omeprazole (PRILOSEC) 20 MG capsule Take 20 mg by mouth daily. 07/23/19  Yes [provider]     Vital Signs: BP 132/71    Pulse (!) 56    Temp 97.9 F (36.6 C) (Oral)    Resp (!) 30    Ht _0  (1.803 m)    Wt 72.4 kg    SpO2 100%    BMI 22.26 kg/m   Physical Exam Vitals signs reviewed.  HENT:     Head: Normocephalic and atraumatic.     Mouth/Throat:     Comments: Poor dentition Cardiovascular:     Rate and Rhythm: Normal rate and regular rhythm.  Pulmonary:     Effort: Pulmonary effort is normal.     Comments: Breath sounds diminished at bases Abdominal:     Palpations: Abdomen is soft.     Tenderness: There is no abdominal tenderness.  Musculoskeletal: Normal range of motion.  Skin:    General: Skin is warm and dry.  Neurological:     General: No focal deficit present.     Mental Status: He is alert.  Psychiatric:        Judgment: Judgment normal.     Imaging: Dg Chest 2 View  Result Date: 08/17/2019 CLINICAL DATA:  Shortness of breath EXAM:  CHEST - 2 VIEW COMPARISON:  None. FINDINGS: Small bilateral pleural effusions. Mild basilar airspace disease. Borderline heart size with mild central vascular congestion. No pneumothorax. IMPRESSION: Mild central vascular congestion with small bilateral effusions. Atelectasis or mild pneumonia at the left greater than right lung base. Electronically Signed   By: Donavan Foil M.D.   On: 08/17/2019 19:57   Ct Angio Chest Pe W/cm &/or Wo Cm  Result Date: 08/17/2019 CLINICAL DATA:  Shortness of breath for 1 month EXAM: CT ANGIOGRAPHY CHEST WITH CONTRAST TECHNIQUE: Multidetector CT imaging of the chest was performed using the standard protocol during bolus administration of intravenous contrast. Multiplanar CT image reconstructions and MIPs were obtained to evaluate the vascular anatomy. CONTRAST:  14m OMNIPAQUE IOHEXOL 350 MG/ML SOLN COMPARISON:  Chest x-ray 08/17/2019 FINDINGS: Cardiovascular: Suboptimal contrast bolus timing and respiratory motion artifact degraded examination of the more distal pulmonary arterial tree. No filling defect within the main or lobar branch pulmonary arteries. No evidence of right heart strain. Heart size is normal. There is pericardial thickening. Thoracic aorta is normal in course and caliber. Mediastinum/Nodes: Bulky mediastinal, bilateral hilar, and bilateral axillary lymphadenopathy. Thyroid, trachea, and esophagus are grossly unremarkable. Lungs/Pleura: Small bilateral pleural effusions, left  greater than right with associated compressive atelectasis. There are numerous bilateral noncalcified pulmonary nodules. Reference nodules include 10 mm right lower lobe juxtapleural nodule (series 6, image 88). 9 mm medial right lower lobe nodule (series 6, image 106). 7 mm anterior right middle lobe nodule (series 6, image 107). 7 mm left lower lobe nodule (series 6, image 90). 8 mm lingular nodule (series 6, image 92). No pneumothorax. Upper Abdomen: Hepatosplenomegaly within the  visualized upper abdomen. Ill-defined area of low density within the peripheral aspect of the spleen measuring approximately 3.7 x 2.8 cm (series 4, image 135). Musculoskeletal: No chest wall abnormality. No acute or significant osseous findings. Review of the MIP images confirms the above findings. IMPRESSION: 1. Limited exam. No filling defect to the lobar branch level to suggest pulmonary embolism. 2. Bulky mediastinal, bilateral hilar, and bilateral axillary lymphadenopathy. Findings concerning for primary lymphoproliferative process such as lymphoma. Metastatic disease secondary to unknown primary is also a consideration. 3. Multiple bilateral noncalcified pulmonary nodules measuring up to 10 mm, likely a result of the same process as listed above. 4. Small bilateral pleural effusions, left greater than right with associated compressive atelectasis. 5. Hepatosplenomegaly within the visualized upper abdomen with ill-defined area of low density within the peripheral aspect of the spleen measuring 3.7 x 2.8 cm. Findings are concerning for lymphomatous involvement versus metastatic disease. Alternatively, sequela of splenic injury could have a similar appearance. These results were called by telephone at the time of interpretation on 08/17/2019 at 8:49 pm to provider MARGAUX VENTER , who verbally acknowledged these results. Electronically Signed   By: Davina Poke M.D.   On: 08/17/2019 20:51   Ct Abdomen Pelvis W Contrast  Result Date: 08/18/2019 CLINICAL DATA:  58 year old with pancytopenia and chest lymphadenopathy. Concern for lymphoproliferative disorder. EXAM: CT ABDOMEN AND PELVIS WITH CONTRAST TECHNIQUE: Multidetector CT imaging of the abdomen and pelvis was performed using the standard protocol following bolus administration of intravenous contrast. CONTRAST:  139m OMNIPAQUE IOHEXOL 300 MG/ML  SOLN COMPARISON:  Chest CT 08/17/2019 FINDINGS: Lower chest: Small bilateral pleural effusions. Partial  visualization of the right infrahilar lymphadenopathy. There may be markedly enlarged lymph nodes adjacent to the distal esophagus. Small amount of pericardial fluid. Again noted is interstitial thickening in the right lower lobe with posterior consolidation in the right lower lobe. Again noted is a elongated nodular structure in the right lower lobe measuring 9 mm on sequence 6, image 20. Pleural-based nodules in the right lower lobe and right middle lobe are similar to the recent comparison examination. Again noted is a pleural-based nodule in the lingula. Focal pleural thickening along the base of the left major fissure is asimilar to the previous examination. Hepatobiliary: Normal appearance of the liver. No discrete liver lesions identified. Main portal venous system is patent. Gallbladder is decompressed. No significant biliary dilatation. Pancreas: Unremarkable. No pancreatic ductal dilatation or surrounding inflammatory changes. Spleen: Spleen is massive for size measuring 27.0 x 10.6 x 17.4 cm, splenic volume is 2489 mL. Scattered areas of non enhancement throughout the periphery of the spleen probably represent infarcts associated with the large size of the spleen. Indeterminate lesion along the left posterior aspect of spleen on sequence 2, image 44 that measures up to 3.8 cm. Small amount of fluid posterior to the spleen near the left hemidiaphragm. Adrenals/Urinary Tract: Adrenal glands are poorly characterized on this examination but no gross abnormality. Compression on the left kidney due to the splenomegaly. Negative for hydronephrosis. Question fullness of the right  renal pelvis but this area is poorly characterized. Mild distention of the urinary bladder. High-density material in the urinary bladder could be related to recent chest CT. Stomach/Bowel: Cannot exclude a hiatal hernia. Stomach is compressed by lymph nodes and the splenomegaly. No evidence for acute bowel inflammation or bowel  obstruction. Vascular/Lymphatic: Diffuse atherosclerotic calcifications in the abdominal aorta without aneurysm. Ectasias involving the celiac trunk, measuring roughly 1.1 cm. There is probably stenosis involving the proximal SMA but poorly characterized on this examination. Venous structures are unremarkable. Evidence for enlarged lymph nodes in the gastrohepatic ligament region. Enlarged lymph nodes in the periaortic region. Enlarged lymph nodes along the iliac nodal chains. Markedly enlarged lymph nodes in the right groin. There is a index lymph node in the right groin that measures 2.8 x 2.1 cm on sequence 2, image 78. Multiple small lymph nodes in both inguinal regions. Reproductive: Prostate is unremarkable. Other: Small amount of free fluid in the pelvis. There appears to be free fluid in the left lower quadrant below the spleen. Negative for free air. Diffuse subcutaneous edema. Dependent fluid in the paraspinal tissues in the lumbar spine. Musculoskeletal: Disc space narrowing at L5-S1. No suspicious bone lesions. IMPRESSION: 1. Massive splenomegaly with abdominal and inguinal lymphadenopathy. Findings are suggestive for a lymphoproliferative process such as lymphoma. 2. Evidence for multiple splenic infarcts likely related to the splenomegaly. Cannot exclude a splenic lesion along the left posterior aspect measuring up to 3.8 cm. 3. Evidence for fluid overload state with diffuse subcutaneous edema, bilateral pleural effusions and ascites. 4. Re-demonstration pleural-based nodularity at the lung bases. There is also septal thickening and a nodular structure in the right lower lobe. Findings are compatible with a neoplastic or lymphoproliferative process. 5. No discrete liver lesions. 6. Aortic Atherosclerosis (ICD10-I70.0). Concern for at least mild stenosis involving the proximal SMA. Ectasia of the celiac trunk. Electronically Signed   By: Markus Daft M.D.   On: 08/18/2019 12:59   Dg Chest Port 1  View  Result Date: 08/18/2019 CLINICAL DATA:  Shortness of breath EXAM: PORTABLE CHEST 1 VIEW COMPARISON:  08/18/2019 FINDINGS: Heart is normal size. Bilateral perihilar and lower lobe opacities. No visible effusions. No acute bony abnormality. IMPRESSION: Perihilar and lower lobe opacities could reflect atelectasis, edema or infection. Electronically Signed   By: Rolm Baptise M.D.   On: 08/18/2019 21:19   Dg Chest Port 1 View  Result Date: 08/18/2019 CLINICAL DATA:  Dyspnea. EXAM: PORTABLE CHEST 1 VIEW COMPARISON:  August 17, 2019. FINDINGS: Stable cardiomediastinal silhouette. No pneumothorax is noted. Mild bibasilar atelectasis or edema is noted with probable small pleural effusions. Bony thorax is unremarkable. IMPRESSION: Mild bibasilar atelectasis or edema is noted with probable small pleural effusions. Electronically Signed   By: Marijo Conception M.D.   On: 08/18/2019 08:27   [place] Core Biopsy (lymph Nodes)  Result Date: 08/18/2019 INDICATION: No known primary, now with extensive lymphadenopathy and splenomegaly worrisome for lymphoma. Please perform ultrasound-guided right inguinal lymph node biopsy for tissue diagnostic purposes. EXAM: ULTRASOUND-GUIDED RIGHT INGUINAL LYMPH NODE BIOPSY COMPARISON:  Chest CT-08/17/2019; CT abdomen and pelvis-earlier same day MEDICATIONS: None ANESTHESIA/SEDATION: None COMPLICATIONS: None immediate. TECHNIQUE: Informed written consent was obtained from the patient after a discussion of the risks, benefits and alternatives to treatment. Questions regarding the procedure were encouraged and answered. Initial ultrasound scanning demonstrated 2 adjacent pathologically enlarged right inguinal lymph nodes compatible with the findings seen on preceding abdominal CT. The dominant approximately 2.7 x 1.6 cm right inguinal lymph  node was targeted for biopsy given location and sonographic window (image 7). An ultrasound image was saved for documentation purposes. The  procedure was planned. A timeout was performed prior to the initiation of the procedure. The operative was prepped and draped in the usual sterile fashion, and a sterile drape was applied covering the operative field. A timeout was performed prior to the initiation of the procedure. Local anesthesia was provided with 1% lidocaine with epinephrine. Under direct ultrasound guidance, an 18 gauge core needle device was utilized to obtain to obtain 6 core needle biopsies of the dominant right inguinal lymph node. The samples were placed in saline and submitted to pathology. The needle was removed and hemostasis was achieved with manual compression. Post procedure scan was negative for significant hematoma. A dressing was placed. The patient tolerated the procedure well without immediate postprocedural complication. IMPRESSION: Technically successful ultrasound guided core needle biopsy of dominant right inguinal lymph node. Electronically Signed   By: Sandi Mariscal M.D.   On: 08/18/2019 13:39   Vas [place] Lower Extremity Venous (dvt)  Result Date: 08/18/2019  Lower Venous Study Indications: Elevated d dimer.  Comparison Study: no prior Performing Technologist: Abram Sander RVS  Examination Guidelines: A complete evaluation includes B-mode imaging, spectral Doppler, color Doppler, and power Doppler as needed of all accessible portions of each vessel. Bilateral testing is considered an integral part of a complete examination. Limited examinations for reoccurring indications may be performed as noted.  +---------+---------------+---------+-----------+----------+--------------+  RIGHT     Compressibility Phasicity Spontaneity Properties Thrombus Aging  +---------+---------------+---------+-----------+----------+--------------+  CFV       Full            Yes       Yes                                    +---------+---------------+---------+-----------+----------+--------------+  SFJ       Full                                                              +---------+---------------+---------+-----------+----------+--------------+  FV Prox   Full                                                             +---------+---------------+---------+-----------+----------+--------------+  FV Mid    Full                                                             +---------+---------------+---------+-----------+----------+--------------+  FV Distal Full                                                             +---------+---------------+---------+-----------+----------+--------------+  PFV       Full                                                             +---------+---------------+---------+-----------+----------+--------------+  POP       Full            Yes       Yes                                    +---------+---------------+---------+-----------+----------+--------------+  PTV       Full                                                             +---------+---------------+---------+-----------+----------+--------------+  PERO      Full                                                             +---------+---------------+---------+-----------+----------+--------------+   +---------+---------------+---------+-----------+----------+--------------+  LEFT      Compressibility Phasicity Spontaneity Properties Thrombus Aging  +---------+---------------+---------+-----------+----------+--------------+  CFV       Full            Yes       Yes                                    +---------+---------------+---------+-----------+----------+--------------+  SFJ       Full                                                             +---------+---------------+---------+-----------+----------+--------------+  FV Prox   Full                                                             +---------+---------------+---------+-----------+----------+--------------+  FV Mid    Full                                                              +---------+---------------+---------+-----------+----------+--------------+  FV Distal Full                                                             +---------+---------------+---------+-----------+----------+--------------+  PFV       Full                                                             +---------+---------------+---------+-----------+----------+--------------+  POP       Full            Yes       Yes                                    +---------+---------------+---------+-----------+----------+--------------+  PTV       Full                                                             +---------+---------------+---------+-----------+----------+--------------+  PERO      Full                                                             +---------+---------------+---------+-----------+----------+--------------+     Summary: Right: There is no evidence of deep vein thrombosis in the lower extremity. No cystic structure found in the popliteal fossa. Left: There is no evidence of deep vein thrombosis in the lower extremity. No cystic structure found in the popliteal fossa.  *See table(s) above for measurements and observations. Electronically signed by Harold Barban MD on 08/18/2019 at 1:53:01 PM.    Final     Labs:  CBC: Recent Labs    08/17/19 1914 08/18/19 0749 08/18/19 1803 08/19/19 0235  WBC 3.4* 3.8*  --  3.8*  HGB 4.6* 7.0* 7.5* 7.2*  HCT 14.3* 21.3* 22.5* 21.7*  PLT 39* 42*  --  47*    COAGS: Recent Labs    08/18/19 1314  INR 1.3*    BMP: Recent Labs    08/17/19 1914 08/18/19 0749 08/19/19 0235  NA 126* 128* 127*  K 4.1 3.9 3.6  CL 92* 96* 92*  CO2 19* 21* 22  GLUCOSE 78 89 118*  BUN _0 CALCIUM 8.6* 8.6* 8.3*  CREATININE 0.52* 0.53* 0.68  GFRNONAA >60 >60 >60  GFRAA >60 >60 >60    LIVER FUNCTION TESTS: Recent Labs    08/18/19 0749 08/19/19 0235  BILITOT 1.4*   1.4* 1.3*  AST 69*   67* 67*  ALT _1 ALKPHOS 160*   171* 147*  PROT 4.4*    4.6* 4.3*  ALBUMIN 2.0*   2.0* 1.8*    Assessment and Plan:  Pancytopenia  Will proceed with bone marrow biopsy today by Dr. Laurence Ferrari.  Risks and benefits of bone marrow biopsy was discussed with the patient and/or patient's family including, but not limited to bleeding, infection, damage to adjacent structures or low yield requiring additional tests.  All of the questions were answered and there is agreement to proceed.  Consent signed and in chart.  Electronically Signed: Murrell Redden, PA-C 08/19/2019, 8:20 AM    I spent a total of 15 Minutes at the the patient's bedside AND on the patient's hospital floor or unit, greater than 50% of which was counseling/coordinating care for bone marrow biopsy.

## 2019-08-19 NOTE — Progress Notes (Signed)
Nutrition Follow-up  DOCUMENTATION CODES:   Severe malnutrition in context of chronic illness  INTERVENTION:   - Ensure Enlive po BID, each supplement provides 350 kcal and 20 grams of protein  - Pro-stat 30 ml po BID, each supplement provides 100 kcal and 15 grams of protein  - MVI with minerals daily  - Encourage adequate PO intake  NUTRITION DIAGNOSIS:   Severe Malnutrition related to chronic illness (concern for lymphoma) as evidenced by severe fat depletion, severe muscle depletion.  New diagnosis after completion of NFPE  GOAL:   Patient will meet greater than or equal to 90% of their needs  Progressing  MONITOR:   PO intake, Supplement acceptance, Labs, Weight trends, I & O's  REASON FOR ASSESSMENT:   Consult Assessment of nutrition requirement/status, Hip fracture protocol  ASSESSMENT:   58 year old male who presented to the ED on 11/17 with SOB and BLE edema. Pt was recently homeless until a couple of weeks ago. CTA with concern for lymphoma to multiple sites. Pt admitted with severe sepsis.  11/18 - ultrasound-guided biopsy of right inguinal lymph node 11/19 - s/p bone marrow biopsy  Spoke with pt at bedside. Pt reports that he is not in a good mood today. Pt states that he ordered his breakfast not too long ago and is waiting for its arrival. Pt is drinking a Coke with an unopened Ensure Enlive at bedside.  Pt reports that he is very hungry. Pt states that he has had a decrease in appetite over the past several weeks accompanied by weight loss. Pt reports his UBW as 167-170 lbs but is unsure when he last weighed this.  Pt states he likes Ensure Enlive and is willing to drink them during admission. Pt states if he drinks too many, it makes him have to use the bathroom.  RD encouraged adequate PO intake and consumption of oral nutrition supplements.  Medications reviewed and include: Ensure Enlive BID, Pro-stat 30 ml BID, folic acid, MVI with minerals,  Protonix, Klor-con 40 mEq once, IV abx  Labs reviewed: sodium 127, chloride 92, magnesium 1.5, hemoglobin 7.2  UOP: 3425 ml x 24 hours  NUTRITION - FOCUSED PHYSICAL EXAM:    Most Recent Value  Orbital Region  Moderate depletion  Upper Arm Region  Severe depletion  Thoracic and Lumbar Region  Severe depletion  Buccal Region  Moderate depletion  Temple Region  Severe depletion  Clavicle Bone Region  Severe depletion  Clavicle and Acromion Bone Region  Severe depletion  Scapular Bone Region  Moderate depletion  Dorsal Hand  Moderate depletion  Patellar Region  Unable to assess [deep pitting edema]  Anterior Thigh Region  Unable to assess  Posterior Calf Region  Unable to assess  Edema (RD Assessment)  Severe [BLE]  Hair  Reviewed  Eyes  Reviewed  Mouth  Reviewed  Skin  Reviewed  Nails  Reviewed       Diet Order:   Diet Order            Diet Heart Room service appropriate? Yes; Fluid consistency: Thin  Diet effective now              EDUCATION NEEDS:   Education needs have been addressed  Skin:  Skin Assessment: Reviewed RN Assessment  Last BM:  no documented BM  Height:   Ht Readings from Last 1 Encounters:  08/18/19 _0  (1.803 m)    Weight:   Wt Readings from Last 1 Encounters:  08/18/19 72.4 kg  Ideal Body Weight:  78.2 kg  BMI:  Body mass index is 22.26 kg/m.  Estimated Nutritional Needs:   Kcal:  2100-2300  Protein:  100-115 grams  Fluid:  >/= 2.0 L    Gaynell Face, MS, RD, LDN Inpatient Clinical Dietitian Pager: [phone] Weekend/After Hours: [phone]

## 2019-08-19 NOTE — Progress Notes (Signed)
PROGRESS NOTE    Isaac Pierce  ZOX:[id number] DOB: [birth year] DOA: 08/17/2019 PCP: Patient, No Pcp Per   Brief Narrative:  HPI per Dr. Shela Leff on 08/17/2019 Isaac Pierce is a 58 y.o. male with a past medical history of chronic back pain, GERD, anxiety, homelessness presenting with a chief complaint of shortness of breath.  Patient states he returned here from [place] about 5 weeks ago and since then has been doing very poorly.  He has no energy and stays in bed all the time.  He has no appetite.  He is feeling short of breath even at rest and his breathing has been getting progressively worse.  He sometimes has a cough productive of white sputum.  He has lost a significant amount of weight.  His legs have been very swollen and his abdomen is also swollen.  He has noticed lumps underneath his axilla and on both sides of his neck.  He wakes up at night drenched in sweat.  States both his parents died from [place].  Denies personal history of malignancy.  He was previously smoking 1 to 2 packs of cigarettes per day but since he has been sick is now only smoking 1 to 4 cigarettes/day.  ED Course: Tachycardic with heart rate in the 110s to 120s.  Tachypneic with respiratory rate in the 20s.  Blood pressure intermittently soft and patient received 1 L normal saline bolus.  Oxygen saturation 96-100% on room air.  Placed on 2 L supplemental oxygen via nasal cannula for increased work of breathing.  Labs showing pancytopenia (WBC count 3.4, hemoglobin 4.6, platelet count 39,000).  FOBT negative.  Other lab abnormalities seen:  Lactic acid 6.4.  Sodium 126.  Bicarb 19, anion gap 15.  BNP normal.  D-dimer elevated at 5.16.  High-sensitivity troponin mildly elevated at 22.  EKG not suggestive of ACS. Pending labs: Pathologist smear review, blood culture x2, SARS-CoV-2 test Chest x-ray showing mild central vascular congestion with small bilateral effusions.  Atelectasis or mild pneumonia at the left  greater than right lung base. Chest CT angiogram (limited exam) negative for PE.  Showing bulky mediastinal, bilateral hilar, and bilateral axillary lymphadenopathy.  Findings concerning for primary lymphoproliferative process such as lymphoma.  Metastatic disease secondary to unknown primary is also a consideration.  Multiple bilateral noncalcified pulmonary nodules measuring up to 10 mm.  Small bilateral pleural effusions, left greater than right with associated compressive atelectasis.  Hepatosplenomegaly within the visualized upper abdomen with ill-defined area of low density within the peripheral aspect of the spleen measuring 3.7 x 2.8 cm, findings concerning for lymphomatous involvement versus metastatic disease versus possible sequela of splenic injury. 2 units PRBCs ordered. ED provider discussed the case with Dr. Julien Nordmann from oncology, will consult in a.m.  **Interim History Had severe respiratory distress yesterday morning was given IV Lasix with improvement.  Pulmonary is consulted for further evaluation recommendations and medical oncology recommending biopsy and he underwent a lymph node biopsy yesterday of the dominant right inguinal lymph node.  Will give an additional 1 unit of pRBC's today to make it 4 units.   Assessment & Plan:   Principal Problem:   Severe sepsis ([place]) Active Problems:   Malignancy (HCC)   Pancytopenia (HCC)   Dyspnea   Pleural effusion   Hyponatremia   Protein-calorie malnutrition, severe  Lactic Acidosis/ Concern for severe sepsis from unknown source and ? Bacteremia -Lactic acid wassignificantly elevated at 6.8 on admission -Although this could be related to underlying  malignancy, there remains concern for severe sepsis given tachycardia, tachypnea, and soft blood pressure readings.  No fever.  Does have lymphopenia.  Chest CTA not suggestive of pneumonia and is negative for PE.  UA not suggestive of infection. -Received 1 L normal saline bolus and was  given more fluid and now is KVO'd   -Continue to monitor blood pressure closely and will not give additional fluid boluses to keep MAP >65 given severe Volume Overload. -Give broad-spectrum antibiotics including vancomycin and Zosyn but will change to IV Cefepime; Will continue IV Vancomycin today and de-escalate later today -Checked procalcitonin level and was 2.60 and worsened today to 3.00 -Trending Lactic Acid and trended down from 6.8 -> 2.9 -Blood culture x2 pending aerobic set of 1 blood cultures gram-positive cocci (Staph Species) could be contaminant; Repeat Blood Cx today  -Urine culture showed no Growth  -SARS-CoV-2 test Negative  -Repeat CXR yesterday evening showed "Perihilar and lower lobe opacities could reflect atelectasis, edema or infection." -Change IV Zosyn to IV cefepime and will continue IV vancomycin as well for now and likley de-escalate later today -Patient did have a temperature this morning of 101.8 after his bone marrow biopsy and continues to remain tachycardic and tachypneic blood pressure is improved  Concern for malignancy/ lymphoproliferative disorder -Patient is presenting with a 5-week history of dyspnea, night sweats, anorexia, significant weight loss, and diffuse lymphadenopathy.  -Chest CT angiogram showing bulky mediastinal, bilateral hilar, and bilateral axillary lymphadenopathy.  Findings concerning for primary lymphoproliferative process such as lymphoma.  Metastatic disease secondary to unknown primary is also consideration.  Also showing multiple bilateral noncalcified pulmonary nodules measuring up to 10 mm.  Hepatosplenomegaly within the visualized upper abdomen with ill-defined area of low density within the peripheral aspect of the spleen measuring 3.7 x 2.8 cm, findings concerning for lymphomatous involvement versus metastatic disease versus possible sequela of splenic injury. -ED provider discussed the case with Dr. Julien Nordmann from oncology -CT abdomen  pelvis for further staging and showed "Massive splenomegaly with abdominal and inguinal lymphadenopathy. Findings are suggestive for a lymphoproliferative process such as lymphoma. Evidence for multiple splenic infarcts likely related to the splenomegaly. Cannot exclude a splenic lesion along the left posterior aspect measuring up to 3.8 cm.  Evidence for fluid overload state with diffuse subcutaneous edema, bilateral pleural effusions and ascites.  Re-demonstration pleural-based nodularity at the lung bases. There is also septal thickening and a nodular structure in the right lower lobe. Findings are compatible with a neoplastic or lymphoproliferative process.  No discrete liver lesions.  Aortic Atherosclerosis.Concern for at least mild stenosis involving the proximal SMA. Ectasia of the celiac trunk." -Since concerning for lymphoma versus metastatic disease from an unknown primary and patient is to undergo an ultrasound-guided lymph node biopsy by IR and had a technically successful ultrasound-guided biopsy of the dominant right inguinal lymph node but they are also considering obtaining a bone marrow biopsy this admission -Pulmonary was also consulted and recommending biopsy and they doubt that he needs mediastinoscopy -Underwent a CT-guided aspirate and core biopsy of the right iliac bone for bone marrow biopsy today  Pancytopenia (leukopenia, severe anemia, and severe thrombocytopenia) -Suspect related to malignancy or lymphoproliferative disorder.  WBC count 3.4, hemoglobin 4.6, platelet count 39,000.  Blood loss anemia from acute GI bleed less likely given negative FOBT. -Anemia panel done and showed an iron level of 87, TIBC 126, TIBC 230, saturation ratios of 41%, ferritin of 1444, folate of 5.4, and vitamin B12 level 1022 -Patient's  hemoglobin/hematocrit went from 4.6/14.3 and is now 7.2/21.7 after 3 units and will transfuse 1 more unit PRBCs -Patient is platelet count is now 47,000 and WBC is  3.8 -Oncology consulted and recommending PLT transfusions for hemoglobin less than 8 and recommended platelet transfusion of platelets drop below 20,000 or active bleeding -Pathologist smear review ordered  -Medical oncology is also started the patient on folic acid 1 mg p.o. 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   -Although patient does have significant peripheral edema, no overt pulmonary edema reported on CT but repeat  .   -BNP normal at 93.7 and 91.8   -Was not initially hypoxic but was Placed on 2 L supplemental oxygen for increased work of breathing and now on 3 Liters yesterday and weaned to Room Air  -BP improved and given IV Lasix again  -Continuous pulse oximetry and maintain O2 saturation greater than 94% -Continue supplemental oxygen via nasal cannula wean O2 as tolerated and currently not wearing Supplemental O2   Hyponatremia -Initially thought to be from Hypovolemia but patient had significant JVD and LE Edema -Stopped IVF and started Diuresis and given IV Lasix 40 mg this Afternoon and another 20 mg after 4th unit of Blood  -Initially Patient may have appeared dehydrated as he Endorsed decreased p.o. intake for over a month.  -Sodium 126 on admission and repeat this AM was 127. -IV fluid hydration now KVO'd  -Continue to monitor CMP in AM  -Check serum osmolarity  Normal anion gap metabolic acidosis -Bicarb 19, anion gap 15 on admission and repeat showed an anion gap of  13, CO2 22, and a chloride of 92.  Patient is not on a diuretic and not endorsing diarrhea. -IV fluid hydration as above and  is now [facility] and given another dose of IV Lasix  -Continue to monitor CMP  Bilateral lower extremity edema/ concern for DVT -Patient has significant bilateral lower extremity edema.   -D-dimer elevated at 5.16.  CT angiogram negative for PE.  There remains concern for DVT given imaging findings consistent with malignancy. -Bilateral lower extremity Dopplers no evidence of the DVT in the right left lower extremity and there is no cystic structures found in popliteal fossa of both extremities -C/w IV Lasix today   Hypomagnesemia -Patient magnesium level this morning is 1.5 -Replete with IV mag sulfate 2 g again  -Continue to monitor replete as necessary -Repeat magnesium level in a.m.  Troponinemia High-sensitivity troponin mildly elevated at 22 on admission .   -EKG not suggestive of ACS.  Patient is not complaining of chest pain and appears comfortable. -C/w Cardiac monitoring -Trend troponin and a trended up to 128  Tobacco Use -Smoking cessation counseling given and was started on a nicotine patch  Volume Overload -In the setting possible CHF versus hypoalbuminemia -BNP was normal -We will check cardiac echocardiogram and he was given IV Lasix today -Currently -680 mL since Admission  -Continue monitor signs and symptoms of volume overload and strict I's and O's and daily weights   Anxiety -Continue home Hydroxyzine  Chronic back pain -Tylenol as needed  GERD -Continue PPI with Pantoprazole 20 mg Daily   Homelessness -Social work consulted for further assistance  HIV screening The patient falls between the ages of 13-64 and should be screened for HIV, therefore HIV testing ordered and is Non-Reactive   Hyperbilirubinemia and abnormal LFTs -AST was 67 ALT was 69 and T bili was 1.4; Now AST is 67, ALT is 25, T bili is 1.3 and trending down -Continue to monitor and trend and check acute hepatitis panel and right upper quadrant ultrasound if necessary -Repeat CMP  in a.m.  Severe malnutrition in the context of chronic illness -Nutritionist consulted for further evaluation recommendations -Nutritionist recommended Ensure Enlive p.o. twice daily, prostat 30 mL p.o. twice daily, multivitamin with mineral daily p.o. and encouraging p.o. intake  DVT prophylaxis: SCDs Code Status: FULL CODE Family Communication: No family present at bedside  Disposition Plan: Pending further clinical improvement pending biopsy results further treatment plan can be formulated and treatment options discussed with medical oncology  Consultants:   Medical Oncology  Pulmonary  Interventional radiology   Procedures:  ECHOCARDIOGRAM IMPRESSIONS    1. Left ventricular ejection fraction, by visual estimation, is 60 to 65%. The left ventricle has normal function. There is no left ventricular hypertrophy.  2. Indeterminate diastolic filling due to E-A fusion.  3. The left ventricle has no regional wall motion abnormalities.  4. Global right ventricle has normal systolic function.The right ventricular size is normal. No increase in right ventricular wall thickness.  5. Left atrial size was mildly dilated.  6. Right atrial size was normal.  7. Trivial pericardial effusion is present.  8. The mitral valve is normal in structure. No evidence of mitral valve regurgitation. No evidence of mitral stenosis.  9. The tricuspid valve is normal in structure. Tricuspid valve regurgitation is trivial. 10. The aortic valve is tricuspid. Aortic valve regurgitation is not visualized. No evidence of aortic valve sclerosis or stenosis. 11. TR signal is inadequate for assessing pulmonary artery systolic pressure. 12. The inferior vena cava is normal in size with greater than 50% respiratory variability, suggesting right atrial pressure of 3 mmHg.  FINDINGS  Left Ventricle: Left ventricular ejection fraction, by visual estimation, is 60 to 65%. The left ventricle has normal function. The  left ventricle has no regional wall motion abnormalities. The left ventricular internal cavity size was the left  ventricle is normal in size. There is no left ventricular hypertrophy. Indeterminate diastolic filling due to E-A fusion.  Right Ventricle: The right ventricular size is normal. No increase in right ventricular wall thickness. Global RV systolic function is has normal systolic function.  Left Atrium: Left atrial size was mildly dilated.  Right Atrium: Right atrial size was normal in size  Pericardium: Trivial pericardial effusion is present.  Mitral Valve: The mitral valve is normal in structure. No evidence of mitral valve stenosis by observation. No evidence of mitral valve regurgitation.  Tricuspid Valve: The tricuspid valve is normal in structure. Tricuspid valve regurgitation is trivial.  Aortic Valve: The aortic valve is tricuspid. Aortic valve regurgitation is not visualized. The aortic valve is structurally normal, with no evidence of sclerosis or stenosis.  Pulmonic Valve: The pulmonic valve was normal in structure. Pulmonic valve regurgitation is not visualized.  Aorta: The aortic root is normal in size and structure.  Venous: The inferior vena cava is normal in size with greater than 50% respiratory variability, suggesting right atrial pressure of 3 mmHg.  IAS/Shunts: No atrial level shunt detected by color flow Doppler.     LEFT VENTRICLE PLAX 2D LVIDd:  5.50 cm Diastology LVIDs:         3.40 cm LV e' lateral: 16.20 cm/s LV PW:         0.90 cm LV e' medial:  10.40 cm/s LV IVS:        0.90 cm LV SV:         100 ml LV SV Index:   52.54    RIGHT VENTRICLE RV S prime:     23.40 cm/s TAPSE (M-mode): 2.7 cm  LEFT ATRIUM             Index LA diam:        3.80 cm 1.98 cm/m LA Vol (A2C):   87.1 ml 45.46 ml/m LA Vol (A4C):   61.0 ml 31.84 ml/m LA Biplane Vol: 72.9 ml 38.05 ml/m  AORTIC VALVE LVOT Vmax:   149.00 cm/s LVOT Vmean:   102.000 cm/s LVOT VTI:    0.266 m   AORTA Ao Root diam: 3.10 cm    SHUNTS Systemic VTI: 0.27 m   Antimicrobials:  Anti-infectives (From admission, onward)   Start     Dose/Rate Route Frequency Ordered Stop   08/18/19 2200  ceFEPIme (MAXIPIME) 2 g in sodium chloride 0.9 % 100 mL IVPB     2 g 200 mL/hr over 30 Minutes Intravenous Every 8 hours 08/18/19 1534     08/18/19 1200  vancomycin (VANCOCIN) 1,250 mg in sodium chloride 0.9 % 250 mL IVPB     1,250 mg 166.7 mL/hr over 90 Minutes Intravenous Every 12 hours 08/18/19 0621     08/18/19 0600  piperacillin-tazobactam (ZOSYN) IVPB 3.375 g     3.375 g 12.5 mL/hr over 240 Minutes Intravenous Every 8 hours 08/17/19 2251 08/18/19 1800   08/17/19 2300  vancomycin (VANCOCIN) 1,500 mg in sodium chloride 0.9 % 500 mL IVPB     1,500 mg 250 mL/hr over 120 Minutes Intravenous  Once 08/17/19 2250 08/18/19 0332   08/17/19 2300  piperacillin-tazobactam (ZOSYN) IVPB 3.375 g     3.375 g 100 mL/hr over 30 Minutes Intravenous  Once 08/17/19 2250 08/18/19 0131     Subjective: Seen and examined at bedside and he is much more awake today than he was yesterday and he is cursing because he wanted to eat.  No nausea or vomiting.  Denies any lightheadedness or dizziness and no other concerns or complaints at this time and is feeling a little bit better than he was yesterday.  Objective: Vitals:   08/19/19 1315 08/19/19 1343 08/19/19 1600 08/19/19 1641  BP:  (!) 132/59 (!) 142/66 128/61  Pulse: (!) 121 (!) 117 (!) 113 (!) 109  Resp: (!) 26 (!) 31 (!) 34 (!) 28  Temp: 100 F (37.8 C) 99.7 F (37.6 C) 97.7 F (36.5 C) 98.6 F (37 C)  TempSrc: Oral Oral Oral Axillary  SpO2: 97% 100% 98% 98%  Weight:      Height:        Intake/Output Summary (Last 24 hours) at 08/19/2019 1713 Last data filed at 08/19/2019 1641 Gross per 24 hour  Intake 1229.05 ml  Output 700 ml  Net 529.05 ml   Filed Weights   08/17/19 2100 08/18/19 0107 08/18/19 0400    Weight: 70.5 kg 72.4 kg 72.4 kg   Examination: Physical Exam:  Constitutional: Thin Caucasian male in mild distress appears anxious and today is agitated  Eyes: Lids and conjunctivae normal, sclerae anicteric  ENMT: External Ears, Nose appear normal. Grossly normal hearing.  Poor dentition Neck:  Appears normal, supple, no cervical masses, normal ROM, no appreciable thyromegaly; JVD is improving Respiratory: Diminished to  auscultation bilaterally with coarse breath sounds and some slight crackles; No appreciable wheezing, rales, rhonchi . Normal respiratory effort and patient is not tachypenic. No accessory muscle use.  Unlabored breathing Cardiovascular: Tachycardic rate but regular rhythm, no murmurs / rubs / gallops. S1 and S2 auscultated.  Has 1+ lower extremity edema Abdomen: Soft, non-tender, non-distended. Bowel sounds positive x4.  GU: Deferred. Musculoskeletal: No clubbing / cyanosis of digits/nails. No joint deformity upper and lower extremities.  Skin: Has some palpable lymphadenopathy. No induration; Warm and dry.  Neurologic: CN 2-12 grossly intact with no focal deficits. Romberg sign and cerebellar reflexes not assessed.  Psychiatric: Normal judgment and insight. Alert and oriented x 3.  Anxious mood and appropriate affect.   Data Reviewed: I have personally reviewed following labs and imaging studies  CBC: Recent Labs  Lab 08/17/19 1914 08/18/19 0749 08/18/19 1803 08/19/19 0235  WBC 3.4* 3.8*  --  3.8*  NEUTROABS 0.9* 1.3*  --  1.0*  HGB 4.6* 7.0* 7.5* 7.2*  HCT 14.3* 21.3* 22.5* 21.7*  MCV 85.1 85.2  --  84.8  PLT 39* 42*  --  47*   Basic Metabolic Panel: Recent Labs  Lab 08/17/19 1914 08/18/19 0749 08/19/19 0235  NA 126* 128* 127*  K 4.1 3.9 3.6  CL 92* 96* 92*  CO2 19* 21* 22  GLUCOSE 78 89 118*  BUN '10 11 12  '$ CREATININE 0.52* 0.53* 0.68  CALCIUM 8.6* 8.6* 8.3*  MG  --  1.5* 1.5*  PHOS  --  3.4 3.0   GFR: Estimated Creatinine Clearance: 105.6  mL/min (by C-G formula based on SCr of 0.68 mg/dL). Liver Function Tests: Recent Labs  Lab 08/18/19 0749 08/19/19 0235  AST 69*   67* 67*  ALT '24   25 25  '$ ALKPHOS 160*   171* 147*  BILITOT 1.4*   1.4* 1.3*  PROT 4.4*   4.6* 4.3*  ALBUMIN 2.0*   2.0* 1.8*   No results for input(s): LIPASE, AMYLASE in the last 168 hours. No results for input(s): AMMONIA in the last 168 hours. Coagulation Profile: Recent Labs  Lab 08/18/19 1314  INR 1.3*   Cardiac Enzymes: No results for input(s): CKTOTAL, CKMB, CKMBINDEX, TROPONINI in the last 168 hours. BNP (last 3 results) No results for input(s): PROBNP in the last 8760 hours. HbA1C: No results for input(s): HGBA1C in the last 72 hours. CBG: No results for input(s): GLUCAP in the last 168 hours. Lipid Profile: No results for input(s): CHOL, HDL, LDLCALC, TRIG, CHOLHDL, LDLDIRECT in the last 72 hours. Thyroid Function Tests: No results for input(s): TSH, T4TOTAL, FREET4, T3FREE, THYROIDAB in the last 72 hours. Anemia Panel: Recent Labs    08/18/19 0749  VITAMINB12 1,022*  FOLATE 5.4*  FERRITIN 1,444*  TIBC 213*  IRON 87  RETICCTPCT 2.0   Sepsis Labs: Recent Labs  Lab 08/18/19 0134 08/18/19 0437 08/18/19 0749 08/18/19 1032 08/19/19 0235  PROCALCITON  --   --  2.60  --  3.00  LATICACIDVEN 6.0* 4.6* 3.0* 2.9*  --     Recent Results (from the past 240 hour(s))  Culture, Urine     Status: None   Collection Time: 08/17/19  7:14 PM   Specimen: Urine, Clean Catch  Result Value Ref Range Status   Specimen Description   Final    URINE, CLEAN CATCH Performed at [facility], [place]  [address] [place], [place] [address]    Special Requests   Final    NONE Performed at [facility], [place] [address], [place], [place] [address]    Culture   Final    NO GROWTH Performed at [facility], [place] [address], [place], [place] [address]    Report Status 08/19/2019 FINAL  Final  SARS  CORONAVIRUS 2 (TAT 6-24 HRS) Nasopharyngeal Nasopharyngeal Swab     Status: None   Collection Time: 08/17/19  8:52 PM   Specimen: Nasopharyngeal Swab  Result Value Ref Range Status   SARS Coronavirus 2 NEGATIVE NEGATIVE Final    Comment: (NOTE) SARS-CoV-2 target nucleic acids are NOT DETECTED. The SARS-CoV-2 RNA is generally detectable in upper and lower respiratory specimens during the acute phase of infection. Negative results do not preclude SARS-CoV-2 infection, do not rule out co-infections with other pathogens, and should not be used as the sole basis for treatment or other patient management decisions. Negative results must be combined with clinical observations, patient history, and epidemiological information. The expected result is Negative. Fact Sheet for Patients: SugarRoll.be Fact Sheet for Healthcare Providers: https://www.woods-mathews.com/ This test is not yet approved or cleared by the [place] FDA and  has been authorized for detection and/or diagnosis of SARS-CoV-2 by FDA under an Emergency Use Authorization (EUA). This EUA will remain  in effect (meaning this test can be used) for the duration of the COVID-19 declaration under Section 56 4(b)(1) of the Act, 21 U.S.C. section 360bbb-3(b)(1), unless the authorization is terminated or revoked sooner. Performed at [facility], [place] [address], [place], [place] [address]   Blood Culture (routine x 2)     Status: Abnormal   Collection Time: 08/17/19  8:55 PM   Specimen: BLOOD  Result Value Ref Range Status   Specimen Description   Final    BLOOD LEFT ANTECUBITAL Performed at [facility] [address], [place], [place] [address]    Special Requests   Final    BOTTLES DRAWN AEROBIC AND ANAEROBIC Blood Culture adequate volume Performed at [place] [facility] [address], [place], [place] [address]    Culture  Setup Time    Final    GRAM POSITIVE COCCI AEROBIC BOTTLE ONLY CRITICAL RESULT CALLED TO, READ BACK BY AND VERIFIED WITH: J. [place], PHARMD ([facility]) AT 2458 ON 08/18/19 BY C. JESSUP, MT.    Culture (A)  Final    STAPHYLOCOCCUS SPECIES (COAGULASE NEGATIVE) THE SIGNIFICANCE OF ISOLATING THIS ORGANISM FROM A SINGLE SET OF BLOOD CULTURES WHEN MULTIPLE SETS ARE DRAWN IS UNCERTAIN. PLEASE NOTIFY THE MICROBIOLOGY DEPARTMENT WITHIN ONE WEEK IF SPECIATION AND SENSITIVITIES ARE REQUIRED. Performed at [facility], [place] [address] [place], [place] [address]    Report Status 08/19/2019 FINAL  Final  Blood Culture (routine x 2)     Status: None (Preliminary result)   Collection Time: 08/17/19  8:55 PM   Specimen: BLOOD  Result Value Ref Range Status   Specimen Description   Final    BLOOD RIGHT ANTECUBITAL Performed at [facility] [address] [place], [place] [address]    Special Requests   Final    BOTTLES DRAWN AEROBIC AND ANAEROBIC Blood Culture results may not be optimal due to an excessive volume of blood received in culture bottles Performed at [facility] [address], [place], [place] [address]    Culture   Final    NO  GROWTH 2 DAYS Performed at [facility], [place] [address], [place], [place] [address]    Report Status PENDING  Incomplete  MRSA PCR Screening     Status: None   Collection Time: 08/18/19 12:22 AM   Specimen: Nasal Mucosa; Nasopharyngeal  Result Value Ref Range Status   MRSA by PCR NEGATIVE NEGATIVE Final    Comment:        The GeneXpert MRSA Assay (FDA approved for NASAL specimens only), is one component of a comprehensive MRSA colonization surveillance program. It is not intended to diagnose MRSA infection nor to guide or monitor treatment for MRSA infections. Performed at [facility], [place] [address] [place],  [address]      Radiology Studies: Dg Chest 2 View  Result Date:  08/17/2019 CLINICAL DATA:  Shortness of breath EXAM: CHEST - 2 VIEW COMPARISON:  None. FINDINGS: Small bilateral pleural effusions. Mild basilar airspace disease. Borderline heart size with mild central vascular congestion. No pneumothorax. IMPRESSION: Mild central vascular congestion with small bilateral effusions. Atelectasis or mild pneumonia at the left greater than right lung base. Electronically Signed   By: Donavan Foil M.D.   On: 08/17/2019 19:57   Ct Angio Chest Pe W/cm &/or Wo Cm  Result Date: 08/17/2019 CLINICAL DATA:  Shortness of breath for 1 month EXAM: CT ANGIOGRAPHY CHEST WITH CONTRAST TECHNIQUE: Multidetector CT imaging of the chest was performed using the standard protocol during bolus administration of intravenous contrast. Multiplanar CT image reconstructions and MIPs were obtained to evaluate the vascular anatomy. CONTRAST:  125m OMNIPAQUE IOHEXOL 350 MG/ML SOLN COMPARISON:  Chest x-ray 08/17/2019 FINDINGS: Cardiovascular: Suboptimal contrast bolus timing and respiratory motion artifact degraded examination of the more distal pulmonary arterial tree. No filling defect within the main or lobar branch pulmonary arteries. No evidence of right heart strain. Heart size is normal. There is pericardial thickening. Thoracic aorta is normal in course and caliber. Mediastinum/Nodes: Bulky mediastinal, bilateral hilar, and bilateral axillary lymphadenopathy. Thyroid, trachea, and esophagus are grossly unremarkable. Lungs/Pleura: Small bilateral pleural effusions, left greater than right with associated compressive atelectasis. There are numerous bilateral noncalcified pulmonary nodules. Reference nodules include 10 mm right lower lobe juxtapleural nodule (series 6, image 88). 9 mm medial right lower lobe nodule (series 6, image 106). 7 mm anterior right middle lobe nodule (series 6, image 107). 7 mm left lower lobe nodule (series 6, image 90). 8 mm lingular nodule (series 6, image 92). No  pneumothorax. Upper Abdomen: Hepatosplenomegaly within the visualized upper abdomen. Ill-defined area of low density within the peripheral aspect of the spleen measuring approximately 3.7 x 2.8 cm (series 4, image 135). Musculoskeletal: No chest wall abnormality. No acute or significant osseous findings. Review of the MIP images confirms the above findings. IMPRESSION: 1. Limited exam. No filling defect to the lobar branch level to suggest pulmonary embolism. 2. Bulky mediastinal, bilateral hilar, and bilateral axillary lymphadenopathy. Findings concerning for primary lymphoproliferative process such as lymphoma. Metastatic disease secondary to unknown primary is also a consideration. 3. Multiple bilateral noncalcified pulmonary nodules measuring up to 10 mm, likely a result of the same process as listed above. 4. Small bilateral pleural effusions, left greater than right with associated compressive atelectasis. 5. Hepatosplenomegaly within the visualized upper abdomen with ill-defined area of low density within the peripheral aspect of the spleen measuring 3.7 x 2.8 cm. Findings are concerning for lymphomatous involvement versus metastatic disease. Alternatively, sequela of splenic injury could have a similar appearance. These results were  called by telephone at the time of interpretation on 08/17/2019 at 8:49 pm to provider MARGAUX VENTER , who verbally acknowledged these results. Electronically Signed   By: Davina Poke M.D.   On: 08/17/2019 20:51   Ct Abdomen Pelvis W Contrast  Result Date: 08/18/2019 CLINICAL DATA:  58 year old with pancytopenia and chest lymphadenopathy. Concern for lymphoproliferative disorder. EXAM: CT ABDOMEN AND PELVIS WITH CONTRAST TECHNIQUE: Multidetector CT imaging of the abdomen and pelvis was performed using the standard protocol following bolus administration of intravenous contrast. CONTRAST:  165m OMNIPAQUE IOHEXOL 300 MG/ML  SOLN COMPARISON:  Chest CT 08/17/2019  FINDINGS: Lower chest: Small bilateral pleural effusions. Partial visualization of the right infrahilar lymphadenopathy. There may be markedly enlarged lymph nodes adjacent to the distal esophagus. Small amount of pericardial fluid. Again noted is interstitial thickening in the right lower lobe with posterior consolidation in the right lower lobe. Again noted is a elongated nodular structure in the right lower lobe measuring 9 mm on sequence 6, image 20. Pleural-based nodules in the right lower lobe and right middle lobe are similar to the recent comparison examination. Again noted is a pleural-based nodule in the lingula. Focal pleural thickening along the base of the left major fissure is asimilar to the previous examination. Hepatobiliary: Normal appearance of the liver. No discrete liver lesions identified. Main portal venous system is patent. Gallbladder is decompressed. No significant biliary dilatation. Pancreas: Unremarkable. No pancreatic ductal dilatation or surrounding inflammatory changes. Spleen: Spleen is massive for size measuring 27.0 x 10.6 x 17.4 cm, splenic volume is 2489 mL. Scattered areas of non enhancement throughout the periphery of the spleen probably represent infarcts associated with the large size of the spleen. Indeterminate lesion along the left posterior aspect of spleen on sequence 2, image 44 that measures up to 3.8 cm. Small amount of fluid posterior to the spleen near the left hemidiaphragm. Adrenals/Urinary Tract: Adrenal glands are poorly characterized on this examination but no gross abnormality. Compression on the left kidney due to the splenomegaly. Negative for hydronephrosis. Question fullness of the right renal pelvis but this area is poorly characterized. Mild distention of the urinary bladder. High-density material in the urinary bladder could be related to recent chest CT. Stomach/Bowel: Cannot exclude a hiatal hernia. Stomach is compressed by lymph nodes and the  splenomegaly. No evidence for acute bowel inflammation or bowel obstruction. Vascular/Lymphatic: Diffuse atherosclerotic calcifications in the abdominal aorta without aneurysm. Ectasias involving the celiac trunk, measuring roughly 1.1 cm. There is probably stenosis involving the proximal SMA but poorly characterized on this examination. Venous structures are unremarkable. Evidence for enlarged lymph nodes in the gastrohepatic ligament region. Enlarged lymph nodes in the periaortic region. Enlarged lymph nodes along the iliac nodal chains. Markedly enlarged lymph nodes in the right groin. There is a index lymph node in the right groin that measures 2.8 x 2.1 cm on sequence 2, image 78. Multiple small lymph nodes in both inguinal regions. Reproductive: Prostate is unremarkable. Other: Small amount of free fluid in the pelvis. There appears to be free fluid in the left lower quadrant below the spleen. Negative for free air. Diffuse subcutaneous edema. Dependent fluid in the paraspinal tissues in the lumbar spine. Musculoskeletal: Disc space narrowing at L5-S1. No suspicious bone lesions. IMPRESSION: 1. Massive splenomegaly with abdominal and inguinal lymphadenopathy. Findings are suggestive for a lymphoproliferative process such as lymphoma. 2. Evidence for multiple splenic infarcts likely related to the splenomegaly. Cannot exclude a splenic lesion along the left posterior aspect measuring  up to 3.8 cm. 3. Evidence for fluid overload state with diffuse subcutaneous edema, bilateral pleural effusions and ascites. 4. Re-demonstration pleural-based nodularity at the lung bases. There is also septal thickening and a nodular structure in the right lower lobe. Findings are compatible with a neoplastic or lymphoproliferative process. 5. No discrete liver lesions. 6. Aortic Atherosclerosis (ICD10-I70.0). Concern for at least mild stenosis involving the proximal SMA. Ectasia of the celiac trunk. Electronically Signed   By:  Markus Daft M.D.   On: 08/18/2019 12:59   Ct Biopsy  Result Date: 08/19/2019 INDICATION: 58 year old male with pancytopenia. He presents for bone marrow biopsy as an inpatient. EXAM: CT GUIDED BONE MARROW ASPIRATION AND CORE BIOPSY Interventional Radiologist:  Criselda Peaches, MD MEDICATIONS: None. ANESTHESIA/SEDATION: 2 mg Versed were administered for anxiolysis. This does not constitute moderate sedation. FLUOROSCOPY TIME:  None. COMPLICATIONS: None immediate. Estimated blood loss: <25 mL PROCEDURE: Informed written consent was obtained from the patient after a thorough discussion of the procedural risks, benefits and alternatives. All questions were addressed. Maximal Sterile Barrier Technique was utilized including caps, mask, sterile gowns, sterile gloves, sterile drape, hand hygiene and skin antiseptic. A timeout was performed prior to the initiation of the procedure. The patient was positioned prone and non-contrast localization CT was performed of the pelvis to demonstrate the iliac marrow spaces. Maximal barrier sterile technique utilized including caps, mask, sterile gowns, sterile gloves, large sterile drape, hand hygiene, and betadine prep. Under sterile conditions and local anesthesia, an 11 gauge coaxial bone biopsy needle was advanced into the right iliac marrow space. Needle position was confirmed with CT imaging. Initially, bone marrow aspiration was performed. Next, the 11 gauge outer cannula was utilized to obtain a right iliac bone marrow core biopsy. Needle was removed. Hemostasis was obtained with compression. The patient tolerated the procedure well. Samples were prepared with the cytotechnologist. IMPRESSION: Technically successful CT-guided bone marrow aspiration and biopsy of the right iliac bone. Electronically Signed   By: Jacqulynn Cadet M.D.   On: 08/19/2019 10:38   Dg Chest Port 1 View  Result Date: 08/18/2019 CLINICAL DATA:  Shortness of breath EXAM: PORTABLE CHEST 1  VIEW COMPARISON:  08/18/2019 FINDINGS: Heart is normal size. Bilateral perihilar and lower lobe opacities. No visible effusions. No acute bony abnormality. IMPRESSION: Perihilar and lower lobe opacities could reflect atelectasis, edema or infection. Electronically Signed   By: Rolm Baptise M.D.   On: 08/18/2019 21:19   Dg Chest Port 1 View  Result Date: 08/18/2019 CLINICAL DATA:  Dyspnea. EXAM: PORTABLE CHEST 1 VIEW COMPARISON:  August 17, 2019. FINDINGS: Stable cardiomediastinal silhouette. No pneumothorax is noted. Mild bibasilar atelectasis or edema is noted with probable small pleural effusions. Bony thorax is unremarkable. IMPRESSION: Mild bibasilar atelectasis or edema is noted with probable small pleural effusions. Electronically Signed   By: Marijo Conception M.D.   On: 08/18/2019 08:27   Ct Bone Marrow Biopsy & Aspiration  Result Date: 08/19/2019 INDICATION: 58 year old male with pancytopenia. He presents for bone marrow biopsy as an inpatient. EXAM: CT GUIDED BONE MARROW ASPIRATION AND CORE BIOPSY Interventional Radiologist:  Criselda Peaches, MD MEDICATIONS: None. ANESTHESIA/SEDATION: 2 mg Versed were administered for anxiolysis. This does not constitute moderate sedation. FLUOROSCOPY TIME:  None. COMPLICATIONS: None immediate. Estimated blood loss: <25 mL PROCEDURE: Informed written consent was obtained from the patient after a thorough discussion of the procedural risks, benefits and alternatives. All questions were addressed. Maximal Sterile Barrier Technique was utilized including caps, mask, sterile  gowns, sterile gloves, sterile drape, hand hygiene and skin antiseptic. A timeout was performed prior to the initiation of the procedure. The patient was positioned prone and non-contrast localization CT was performed of the pelvis to demonstrate the iliac marrow spaces. Maximal barrier sterile technique utilized including caps, mask, sterile gowns, sterile gloves, large sterile drape, hand  hygiene, and betadine prep. Under sterile conditions and local anesthesia, an 11 gauge coaxial bone biopsy needle was advanced into the right iliac marrow space. Needle position was confirmed with CT imaging. Initially, bone marrow aspiration was performed. Next, the 11 gauge outer cannula was utilized to obtain a right iliac bone marrow core biopsy. Needle was removed. Hemostasis was obtained with compression. The patient tolerated the procedure well. Samples were prepared with the cytotechnologist. IMPRESSION: Technically successful CT-guided bone marrow aspiration and biopsy of the right iliac bone. Electronically Signed   By: Jacqulynn Cadet M.D.   On: 08/19/2019 10:38   [place] Core Biopsy (lymph Nodes)  Result Date: 08/18/2019 INDICATION: No known primary, now with extensive lymphadenopathy and splenomegaly worrisome for lymphoma. Please perform ultrasound-guided right inguinal lymph node biopsy for tissue diagnostic purposes. EXAM: ULTRASOUND-GUIDED RIGHT INGUINAL LYMPH NODE BIOPSY COMPARISON:  Chest CT-08/17/2019; CT abdomen and pelvis-earlier same day MEDICATIONS: None ANESTHESIA/SEDATION: None COMPLICATIONS: None immediate. TECHNIQUE: Informed written consent was obtained from the patient after a discussion of the risks, benefits and alternatives to treatment. Questions regarding the procedure were encouraged and answered. Initial ultrasound scanning demonstrated 2 adjacent pathologically enlarged right inguinal lymph nodes compatible with the findings seen on preceding abdominal CT. The dominant approximately 2.7 x 1.6 cm right inguinal lymph node was targeted for biopsy given location and sonographic window (image 7). An ultrasound image was saved for documentation purposes. The procedure was planned. A timeout was performed prior to the initiation of the procedure. The operative was prepped and draped in the usual sterile fashion, and a sterile drape was applied covering the operative field. A timeout  was performed prior to the initiation of the procedure. Local anesthesia was provided with 1% lidocaine with epinephrine. Under direct ultrasound guidance, an 18 gauge core needle device was utilized to obtain to obtain 6 core needle biopsies of the dominant right inguinal lymph node. The samples were placed in saline and submitted to pathology. The needle was removed and hemostasis was achieved with manual compression. Post procedure scan was negative for significant hematoma. A dressing was placed. The patient tolerated the procedure well without immediate postprocedural complication. IMPRESSION: Technically successful ultrasound guided core needle biopsy of dominant right inguinal lymph node. Electronically Signed   By: Sandi Mariscal M.D.   On: 08/18/2019 13:39   Vas [place] Lower Extremity Venous (dvt)  Result Date: 08/18/2019  Lower Venous Study Indications: Elevated d dimer.  Comparison Study: no prior Performing Technologist: Abram Sander RVS  Examination Guidelines: A complete evaluation includes B-mode imaging, spectral Doppler, color Doppler, and power Doppler as needed of all accessible portions of each vessel. Bilateral testing is considered an integral part of a complete examination. Limited examinations for reoccurring indications may be performed as noted.  +---------+---------------+---------+-----------+----------+--------------+  RIGHT     Compressibility Phasicity Spontaneity Properties Thrombus Aging  +---------+---------------+---------+-----------+----------+--------------+  CFV       Full            Yes       Yes                                    +---------+---------------+---------+-----------+----------+--------------+  SFJ       Full                                                             +---------+---------------+---------+-----------+----------+--------------+  FV Prox   Full                                                              +---------+---------------+---------+-----------+----------+--------------+  FV Mid    Full                                                             +---------+---------------+---------+-----------+----------+--------------+  FV Distal Full                                                             +---------+---------------+---------+-----------+----------+--------------+  PFV       Full                                                             +---------+---------------+---------+-----------+----------+--------------+  POP       Full            Yes       Yes                                    +---------+---------------+---------+-----------+----------+--------------+  PTV       Full                                                             +---------+---------------+---------+-----------+----------+--------------+  PERO      Full                                                             +---------+---------------+---------+-----------+----------+--------------+   +---------+---------------+---------+-----------+----------+--------------+  LEFT      Compressibility Phasicity Spontaneity Properties Thrombus Aging  +---------+---------------+---------+-----------+----------+--------------+  CFV       Full            Yes       Yes                                    +---------+---------------+---------+-----------+----------+--------------+  SFJ       Full                                                             +---------+---------------+---------+-----------+----------+--------------+  FV Prox   Full                                                             +---------+---------------+---------+-----------+----------+--------------+  FV Mid    Full                                                             +---------+---------------+---------+-----------+----------+--------------+  FV Distal Full                                                              +---------+---------------+---------+-----------+----------+--------------+  PFV       Full                                                             +---------+---------------+---------+-----------+----------+--------------+  POP       Full            Yes       Yes                                    +---------+---------------+---------+-----------+----------+--------------+  PTV       Full                                                             +---------+---------------+---------+-----------+----------+--------------+  PERO      Full                                                             +---------+---------------+---------+-----------+----------+--------------+     Summary: Right: There is no evidence of deep vein thrombosis in the lower extremity. No cystic structure found in the popliteal fossa. Left: There is no evidence of deep vein thrombosis in the lower extremity. No cystic structure found in the popliteal fossa.  *See table(s) above for measurements and observations. Electronically signed  by Harold Barban MD on 08/18/2019 at 1:53:01 PM.    Final    Scheduled Meds:  sodium chloride   Intravenous Once   Chlorhexidine Gluconate Cloth  6 each Topical Daily   feeding supplement (ENSURE ENLIVE)  237 mL Oral BID BM   feeding supplement (PRO-STAT SUGAR FREE 64)  30 mL Oral BID   folic acid  1 mg Oral Daily   hydrocortisone cream   Topical BID   ipratropium  0.5 mg Nebulization BID   levalbuterol  0.63 mg Nebulization BID   mouth rinse  15 mL Mouth Rinse BID   midazolam       multivitamin with minerals  1 tablet Oral Daily   nicotine  7 mg Transdermal Daily   pantoprazole  20 mg Oral Daily   Continuous Infusions:  ceFEPime (MAXIPIME) IV Stopped (08/19/19 1304)   sodium chloride     vancomycin Stopped (08/19/19 0850)    LOS: 2 days   Kerney Elbe, DO [facility] PAGER is on [place]  If 7PM-7AM, please contact night-coverage [email or web address]

## 2019-08-20 ENCOUNTER — Inpatient Hospital Stay (HOSPITAL_COMMUNITY): Payer: Medicaid Other

## 2019-08-20 DIAGNOSIS — E43 Unspecified severe protein-calorie malnutrition: Secondary | ICD-10-CM

## 2019-08-20 LAB — CBC WITH DIFFERENTIAL/PLATELET
Abs Immature Granulocytes: 0.04 10*3/uL (ref 0.00–0.07)
Basophils Absolute: 0 10*3/uL (ref 0.0–0.1)
Basophils Relative: 1 %
Eosinophils Absolute: 0 10*3/uL (ref 0.0–0.5)
Eosinophils Relative: 0 %
HCT: 23.9 % — ABNORMAL LOW (ref 39.0–52.0)
Hemoglobin: 7.8 g/dL — ABNORMAL LOW (ref 13.0–17.0)
Immature Granulocytes: 1 %
Lymphocytes Relative: 44 %
Lymphs Abs: 1.9 10*3/uL (ref 0.7–4.0)
MCH: 28 pg (ref 26.0–34.0)
MCHC: 32.6 g/dL (ref 30.0–36.0)
MCV: 85.7 fL (ref 80.0–100.0)
Monocytes Absolute: 1.1 10*3/uL — ABNORMAL HIGH (ref 0.1–1.0)
Monocytes Relative: 25 %
Neutro Abs: 1.2 10*3/uL — ABNORMAL LOW (ref 1.7–7.7)
Neutrophils Relative %: 29 %
Platelets: 38 10*3/uL — ABNORMAL LOW (ref 150–400)
RBC: 2.79 MIL/uL — ABNORMAL LOW (ref 4.22–5.81)
RDW: 22.5 % — ABNORMAL HIGH (ref 11.5–15.5)
WBC: 4.2 10*3/uL (ref 4.0–10.5)
nRBC: 0 % (ref 0.0–0.2)

## 2019-08-20 LAB — TYPE AND SCREEN
ABO/RH(D): O POS
Antibody Screen: NEGATIVE
Unit division: 0
Unit division: 0
Unit division: 0
Unit division: 0

## 2019-08-20 LAB — BPAM RBC
Blood Product Expiration Date: 202012162359
Blood Product Expiration Date: 202012162359
Blood Product Expiration Date: 202012172359
Blood Product Expiration Date: 202012202359
ISSUE DATE / TIME: 202011172239
ISSUE DATE / TIME: 202011180248
ISSUE DATE / TIME: 202011181322
ISSUE DATE / TIME: 202011191318
Unit Type and Rh: 5100
Unit Type and Rh: 5100
Unit Type and Rh: 5100
Unit Type and Rh: 5100

## 2019-08-20 LAB — COMPREHENSIVE METABOLIC PANEL
ALT: 25 U/L (ref 0–44)
AST: 67 U/L — ABNORMAL HIGH (ref 15–41)
Albumin: 1.8 g/dL — ABNORMAL LOW (ref 3.5–5.0)
Alkaline Phosphatase: 153 U/L — ABNORMAL HIGH (ref 38–126)
Anion gap: 14 (ref 5–15)
BUN: 13 mg/dL (ref 6–20)
CO2: 21 mmol/L — ABNORMAL LOW (ref 22–32)
Calcium: 8 mg/dL — ABNORMAL LOW (ref 8.9–10.3)
Chloride: 96 mmol/L — ABNORMAL LOW (ref 98–111)
Creatinine, Ser: 0.63 mg/dL (ref 0.61–1.24)
GFR calc Af Amer: >60 mL/min (ref >60)
GFR calc non Af Amer: >60 mL/min (ref >60)
Glucose, Bld: 107 mg/dL — ABNORMAL HIGH (ref 70–99)
Potassium: 3.5 mmol/L (ref 3.5–5.1)
Sodium: 131 mmol/L — ABNORMAL LOW (ref 135–145)
Total Bilirubin: 1 mg/dL (ref 0.3–1.2)
Total Protein: 4.3 g/dL — ABNORMAL LOW (ref 6.5–8.1)

## 2019-08-20 LAB — PHOSPHORUS: Phosphorus: 3 mg/dL (ref 2.5–4.6)

## 2019-08-20 LAB — PROCALCITONIN: Procalcitonin: 2.31 ng/mL

## 2019-08-20 LAB — MAGNESIUM: Magnesium: 1.7 mg/dL (ref 1.7–2.4)

## 2019-08-20 MED ORDER — FUROSEMIDE 10 MG/ML IJ SOLN
40.0000 mg | Freq: Once | INTRAMUSCULAR | Status: AC
  Administered 2019-08-20: 40 mg via INTRAVENOUS
  Filled 2019-08-20: qty 4

## 2019-08-20 MED ORDER — NYSTATIN 100000 UNIT/ML MT SUSP
5.0000 mL | Freq: Four times a day (QID) | OROMUCOSAL | Status: DC
  Administered 2019-08-21 – 2019-09-05 (×42): 500000 [IU] via OROMUCOSAL
  Filled 2019-08-20 (×46): qty 5

## 2019-08-20 MED ORDER — LEVALBUTEROL HCL 0.63 MG/3ML IN NEBU
INHALATION_SOLUTION | RESPIRATORY_TRACT | Status: AC
  Filled 2019-08-20: qty 3

## 2019-08-20 MED ORDER — SODIUM CHLORIDE 0.9 % IV BOLUS
500.0000 mL | Freq: Once | INTRAVENOUS | Status: AC
  Administered 2019-08-20: 500 mL via INTRAVENOUS

## 2019-08-20 MED ORDER — PHENOL 1.4 % MT LIQD
1.0000 | OROMUCOSAL | Status: DC | PRN
  Filled 2019-08-20: qty 177

## 2019-08-20 MED ORDER — MAGNESIUM SULFATE 2 GM/50ML IV SOLN
2.0000 g | Freq: Once | INTRAVENOUS | Status: AC
  Administered 2019-08-20: 2 g via INTRAVENOUS
  Filled 2019-08-20: qty 50

## 2019-08-20 MED ORDER — TRAZODONE HCL 50 MG PO TABS
50.0000 mg | ORAL_TABLET | Freq: Every evening | ORAL | Status: DC | PRN
  Administered 2019-08-21 – 2019-09-03 (×4): 50 mg via ORAL
  Filled 2019-08-20 (×5): qty 1

## 2019-08-20 MED ORDER — HYDROCORTISONE (PERIANAL) 2.5 % EX CREA
TOPICAL_CREAM | Freq: Three times a day (TID) | CUTANEOUS | Status: DC
  Administered 2019-08-20 – 2019-08-21 (×2): via RECTAL
  Administered 2019-08-21: 1 via RECTAL
  Administered 2019-08-21 – 2019-08-26 (×11): via RECTAL
  Administered 2019-08-30: 1 via RECTAL
  Administered 2019-09-02: 23:00:00 via RECTAL
  Filled 2019-08-20 (×2): qty 28.35

## 2019-08-20 NOTE — Progress Notes (Signed)
Patient arrived to unit, VS-T 102.6, BP 134/70, HR 128. MEWS is red, Provider updated will administer Tylenol. Will continue to monitor and follow up.

## 2019-08-20 NOTE — Progress Notes (Signed)
Patient's vitals:  89 F HR 129 RR 24 128/68 (MAP 86) 94 % room Air Patient is in the RED MEWS. PCP on call notified.

## 2019-08-20 NOTE — Progress Notes (Signed)
PROGRESS NOTE    Isaac Pierce  VOH:[id number] DOB: 29-Jan-1961 DOA: 08/17/2019 PCP: Patient, No Pcp Per   Brief Narrative:  HPI per Dr. Shela Leff on 08/17/2019 Isaac Pierce is a 58 y.o. male with a past medical history of chronic back pain, GERD, anxiety, homelessness presenting with a chief complaint of shortness of breath.  Patient states he returned here from [place] about 5 weeks ago and since then has been doing very poorly.  He has no energy and stays in bed all the time.  He has no appetite.  He is feeling short of breath even at rest and his breathing has been getting progressively worse.  He sometimes has a cough productive of white sputum.  He has lost a significant amount of weight.  His legs have been very swollen and his abdomen is also swollen.  He has noticed lumps underneath his axilla and on both sides of his neck.  He wakes up at night drenched in sweat.  States both his parents died from [facility].  Denies personal history of malignancy.  He was previously smoking 1 to 2 packs of cigarettes per day but since he has been sick is now only smoking 1 to 4 cigarettes/day.  ED Course: Tachycardic with heart rate in the 110s to 120s.  Tachypneic with respiratory rate in the 20s.  Blood pressure intermittently soft and patient received 1 L normal saline bolus.  Oxygen saturation 96-100% on room air.  Placed on 2 L supplemental oxygen via nasal cannula for increased work of breathing.  Labs showing pancytopenia (WBC count 3.4, hemoglobin 4.6, platelet count 39,000).  FOBT negative.  Other lab abnormalities seen:  Lactic acid 6.4.  Sodium 126.  Bicarb 19, anion gap 15.  BNP normal.  D-dimer elevated at 5.16.  High-sensitivity troponin mildly elevated at 22.  EKG not suggestive of ACS. Pending labs: Pathologist smear review, blood culture x2, SARS-CoV-2 test Chest x-ray showing mild central vascular congestion with small bilateral effusions.  Atelectasis or mild pneumonia at the left  greater than right lung base. Chest CT angiogram (limited exam) negative for PE.  Showing bulky mediastinal, bilateral hilar, and bilateral axillary lymphadenopathy.  Findings concerning for primary lymphoproliferative process such as lymphoma.  Metastatic disease secondary to unknown primary is also a consideration.  Multiple bilateral noncalcified pulmonary nodules measuring up to 10 mm.  Small bilateral pleural effusions, left greater than right with associated compressive atelectasis.  Hepatosplenomegaly within the visualized upper abdomen with ill-defined area of low density within the peripheral aspect of the spleen measuring 3.7 x 2.8 cm, findings concerning for lymphomatous involvement versus metastatic disease versus possible sequela of splenic injury. 2 units PRBCs ordered. ED provider discussed the case with Dr. Julien Nordmann from oncology, will consult in a.m.  **Interim History During the course of his hospitalization he had severe respiratory distress and is administered IV Lasix with improvement.  Pulmonary is consulted for further evaluation recommendations and medical oncology recommending biopsy and he underwent a lymph node biopsy yesterday of the dominant right inguinal lymph node.  He is status post 4 units of PRBCs and blood count is stabilized but still on the lower side.  We will give another dose of IV Lasix today given his volume overload and effective findings. Patient continues to spike temperatures and will continue IV cefepime for now  Assessment & Plan:   Principal Problem:   Severe sepsis [facility]) Active Problems:   Malignancy ([place])   Pancytopenia (HCC)   Dyspnea  Pleural effusion   Hyponatremia   Protein-calorie malnutrition, severe  Lactic Acidosis/ Concern for severe sepsis from unknown source and ? Bacteremia -Lactic acid wassignificantly elevated at 6.8 on admission -Although this could be related to underlying malignancy, there remains concern for severe sepsis  given tachycardia, tachypnea, and soft blood pressure readings.  No fever.  Does have lymphopenia.  Chest CTA not suggestive of pneumonia and is negative for PE.  UA not suggestive of infection. -Received 1 L normal saline bolus and was given more fluid and now is KVO'd   -Continue to monitor blood pressure closely and will not give additional fluid boluses to keep MAP >65 given severe Volume Overload.  Given a dose of IV Lasix today -Give broad-spectrum antibiotics including vancomycin and Zosyn but will change to IV Cefepime; Will continue IV Vancomycin today and de-escalate later today -Checked procalcitonin level and was 2.60 and worsened today to 3.00 and today is 2.31 -Trending Lactic Acid and trended down from 6.8 -> 2.9 -Blood culture x2 pending aerobic set of 1 blood cultures gram-positive cocci (Staph Species) could be contaminant; Repeat Blood Cx yesterday showed no growth to date less than 12 hours -Urine culture showed no Growth  -SARS-CoV-2 test Negative  -Repeat CXR yesterday evening showed "Pleural effusions and atelectasis that have increased from 2 days ago." -Changed IV Zosyn to IV cefepime will continue; IV vancomycin is now stopped -Patient did have a temperature yesterday morning of 101.8 after his bone marrow biopsy and continues to remain tachycardic and tachypneic blood pressure is improved; this afternoon he had a temperature of 102.9 he is tachypneic and tachycardic but this is improved after Tylenol administration after his fever has normalized  Concern for malignancy/ lymphoproliferative disorder -Patient is presenting with a 5-week history of dyspnea, night sweats, anorexia, significant weight loss, and diffuse lymphadenopathy.  -Chest CT angiogram showing bulky mediastinal, bilateral hilar, and bilateral axillary lymphadenopathy.  Findings concerning for primary lymphoproliferative process such as lymphoma.  Metastatic disease secondary to unknown primary is also  consideration.  Also showing multiple bilateral noncalcified pulmonary nodules measuring up to 10 mm.  Hepatosplenomegaly within the visualized upper abdomen with ill-defined area of low density within the peripheral aspect of the spleen measuring 3.7 x 2.8 cm, findings concerning for lymphomatous involvement versus metastatic disease versus possible sequela of splenic injury. -ED provider discussed the case with Dr. Julien Nordmann from oncology -CT abdomen pelvis for further staging and showed "Massive splenomegaly with abdominal and inguinal lymphadenopathy. Findings are suggestive for a lymphoproliferative process such as lymphoma. Evidence for multiple splenic infarcts likely related to the splenomegaly. Cannot exclude a splenic lesion along the left posterior aspect measuring up to 3.8 cm.  Evidence for fluid overload state with diffuse subcutaneous edema, bilateral pleural effusions and ascites.  Re-demonstration pleural-based nodularity at the lung bases. There is also septal thickening and a nodular structure in the right lower lobe. Findings are compatible with a neoplastic or lymphoproliferative process.  No discrete liver lesions.  Aortic Atherosclerosis.Concern for at least mild stenosis involving the proximal SMA. Ectasia of the celiac trunk." -Since concerning for lymphoma versus metastatic disease from an unknown primary and patient is to undergo an ultrasound-guided lymph node biopsy by IR and had a technically successful ultrasound-guided biopsy of the dominant right inguinal lymph node but they are also considering obtaining a bone marrow biopsy this admission -Pulmonary was also consulted and recommending biopsy and they doubt that he needs mediastinoscopy -Underwent a CT-guided aspirate and core biopsy of  the right iliac bone for bone marrow biopsy yesterday -Biopsies are still pending -Continues to spike temperatrues and ?If related to Lymphoproliferative disorder  Pancytopenia (leukopenia,  severe anemia, and severe thrombocytopenia) -Suspect related to malignancy or lymphoproliferative disorder.  WBC count 3.4, hemoglobin 4.6, platelet count 39,000 on admission.  Blood loss anemia from acute GI bleed less likely given negative FOBT. -Anemia panel done and showed an iron level of 87, TIBC 126, TIBC 230, saturation ratios of 41%, ferritin of 1444, folate of 5.4, and vitamin B12 level 1022 -Patient's hemoglobin/hematocrit went from 4.6/14.3 and is now 7.8/23.9 after 4 units -Patient is platelet count is now 38,000 and WBC is 4.2 -Oncology consulted and recommending PLT transfusions for hemoglobin less than 8 and recommended platelet transfusion of platelets drop below 20,000 or active bleeding -Pathologist smear review ordered  -Medical oncology is also started the patient on folic acid 1 mg p.o. 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   -Although patient does have significant peripheral edema, no overt pulmonary edema reported on CT but repeat chest x-ray did show pleural effusions that are increased.   -BNP normal at 93.7 and 91.8   -Weaned to room air -BP improved and given IV Lasix again today given her chest x-ray findings -Continuous pulse oximetry and maintain O2 saturation greater than 94% -Continue supplemental oxygen via nasal cannula wean O2 as tolerated and currently not wearing Supplemental O2   Hyponatremia, improving -Initially thought to be from Hypovolemia but patient had significant JVD and LE Edema -Stopped IVF and started Diuresis and given another dose of IV Lasix this  morning -Initially Patient may have appeared dehydrated as he Endorsed decreased p.o. intake for over a month.  -Sodium 126 on admission and repeat this AM was 131 -IV fluid hydration now KVO'd  -Continue to monitor CMP in AM  -Check serum osmolarity  Normal anion gap metabolic acidosis -Bicarb 19, anion gap 15 on admission and repeat showed an anion gap of 14, CO2 21, and a chloride of 96.  Patient is not on a diuretic and not endorsing diarrhea we have started him on Lasix -IV fluid hydration as above and is now [facility] and given another dose of IV Lasix  -Continue to monitor CMP  Bilateral lower extremity edema/ concern for DVT, improved -Patient has significant bilateral lower extremity edema.   -D-dimer elevated at 5.16.  CT angiogram negative for PE.  There remains concern for DVT given imaging findings consistent with malignancy. -Bilateral lower extremity Dopplers no evidence of the DVT in the right left lower extremity and there is no cystic structures found in popliteal fossa of both extremities -C/w IV Lasix today again  Hypomagnesemia -Patient magnesium level this morning is 1.7 -Replete with IV mag sulfate 2 g again  -Continue to monitor replete as necessary -Repeat magnesium level in a.m.  Troponinemia High-sensitivity troponin mildly elevated at 22 on admission .   -EKG not suggestive of ACS.  Patient is not complaining of chest pain and appears comfortable. -C/w Cardiac monitoring -Trend troponin and a trended up to 128  Tobacco Use -Smoking cessation counseling given and was started on a nicotine patch  Volume Overload -In the setting  possible CHF versus hypoalbuminemia -BNP was normal -We will check cardiac echocardiogram and he was given IV Lasix again today -Currently - 3.400 L since Admission  -Continue monitor signs and symptoms of volume overload and strict I's and O's and daily weights   Anxiety -Continue home Hydroxyzine  Chronic back pain -Tylenol  as needed  GERD -Continue PPI with Pantoprazole 20 mg Daily   Homelessness -Social work consulted for further assistance   HIV screening The patient falls between the ages of 13-64 and should be screened for HIV, therefore HIV testing ordered and is Non-Reactive   Hyperbilirubinemia and abnormal LFTs -AST was 67 ALT was 69 and T bili was 1.4; Now AST is 67, ALT is 25, T bili is 1.0 and trending down -Continue to monitor and trend and check acute hepatitis panel and right upper quadrant ultrasound if necessary -Repeat CMP in a.m.  Severe malnutrition in the context of chronic illness -Nutritionist consulted for further evaluation recommendations -Nutritionist recommended Ensure Enlive p.o. twice daily, prostat 30 mL p.o. twice daily, multivitamin with mineral daily p.o. and encouraging p.o. intake  DVT prophylaxis: SCDs Code Status: FULL CODE Family Communication: No family present at bedside  Disposition Plan: Transferred to Telemetry now that he has improved   Consultants:   Medical Oncology  Pulmonary  Interventional radiology   Procedures:  ECHOCARDIOGRAM IMPRESSIONS    1. Left ventricular ejection fraction, by visual estimation, is 60 to 65%. The left ventricle has normal function. There is no left ventricular hypertrophy.  2. Indeterminate diastolic filling due to E-A fusion.  3. The left ventricle has no regional wall motion abnormalities.  4. Global right ventricle has normal systolic function.The right ventricular size is normal. No increase in right ventricular wall thickness.  5. Left atrial size was mildly dilated.  6. Right atrial size was normal.  7. Trivial pericardial effusion is present.  8. The mitral valve is normal in structure. No evidence of mitral valve regurgitation. No evidence of mitral stenosis.  9. The tricuspid valve is normal in structure. Tricuspid valve regurgitation is trivial. 10. The aortic valve is tricuspid. Aortic valve  regurgitation is not visualized. No evidence of aortic valve sclerosis or stenosis. 11. TR signal is inadequate for assessing pulmonary artery systolic pressure. 12. The inferior vena cava is normal in size with greater than 50% respiratory variability, suggesting right atrial pressure of 3 mmHg.  FINDINGS  Left Ventricle: Left ventricular ejection fraction, by visual estimation, is 60 to 65%. The left ventricle has normal function. The left ventricle has no regional wall motion abnormalities. The left ventricular internal cavity size was the left  ventricle is normal in size. There is no left ventricular hypertrophy. Indeterminate diastolic filling due to E-A fusion.  Right Ventricle: The right ventricular size is normal. No increase in right ventricular wall thickness. Global RV systolic function is has normal systolic function.  Left Atrium: Left atrial size was mildly dilated.  Right Atrium: Right atrial size was normal in size  Pericardium: Trivial pericardial effusion is present.  Mitral Valve: The mitral valve is normal in structure. No evidence of mitral valve stenosis by observation. No evidence of mitral valve regurgitation.  Tricuspid Valve: The tricuspid valve is normal in structure. Tricuspid valve regurgitation is trivial.  Aortic Valve: The aortic valve is tricuspid. Aortic valve regurgitation is not visualized. The aortic valve is structurally normal, with no evidence of sclerosis or stenosis.  Pulmonic Valve: The pulmonic valve was normal in structure. Pulmonic valve regurgitation  is not visualized.  Aorta: The aortic root is normal in size and structure.  Venous: The inferior vena cava is normal in size with greater than 50% respiratory variability, suggesting right atrial pressure of 3 mmHg.  IAS/Shunts: No atrial level shunt detected by color flow Doppler.     LEFT VENTRICLE PLAX 2D LVIDd:         5.50 cm Diastology LVIDs:         3.40 cm LV e'  lateral: 16.20 cm/s LV PW:         0.90 cm LV e' medial:  10.40 cm/s LV IVS:        0.90 cm LV SV:         100 ml LV SV Index:   52.54    RIGHT VENTRICLE RV S prime:     23.40 cm/s TAPSE (M-mode): 2.7 cm  LEFT ATRIUM             Index LA diam:        3.80 cm 1.98 cm/m LA Vol (A2C):   87.1 ml 45.46 ml/m LA Vol (A4C):   61.0 ml 31.84 ml/m LA Biplane Vol: 72.9 ml 38.05 ml/m  AORTIC VALVE LVOT Vmax:   149.00 cm/s LVOT Vmean:  102.000 cm/s LVOT VTI:    0.266 m   AORTA Ao Root diam: 3.10 cm    SHUNTS Systemic VTI: 0.27 m   Antimicrobials:  Anti-infectives (From admission, onward)   Start     Dose/Rate Route Frequency Ordered Stop   08/18/19 2200  ceFEPIme (MAXIPIME) 2 g in sodium chloride 0.9 % 100 mL IVPB     2 g 200 mL/hr over 30 Minutes Intravenous Every 8 hours 08/18/19 1534     08/18/19 1200  vancomycin (VANCOCIN) 1,250 mg in sodium chloride 0.9 % 250 mL IVPB  Status:  Discontinued     1,250 mg 166.7 mL/hr over 90 Minutes Intravenous Every 12 hours 08/18/19 0621 08/19/19 1733   08/18/19 0600  piperacillin-tazobactam (ZOSYN) IVPB 3.375 g     3.375 g 12.5 mL/hr over 240 Minutes Intravenous Every 8 hours 08/17/19 2251 08/18/19 1800   08/17/19 2300  vancomycin (VANCOCIN) 1,500 mg in sodium chloride 0.9 % 500 mL IVPB     1,500 mg 250 mL/hr over 120 Minutes Intravenous  Once 08/17/19 2250 08/18/19 0332   08/17/19 2300  piperacillin-tazobactam (ZOSYN) IVPB 3.375 g     3.375 g 100 mL/hr over 30 Minutes Intravenous  Once 08/17/19 2250 08/18/19 0131     Subjective: Seen and examined at bedside states that he is feeling much better today.  Still awaiting biopsy results.  No nausea or vomiting.  Thinks legs are less swollen and feels much better and thought he was initially going to die.  No other concerns or complaints this time  Objective: Vitals:   08/20/19 1000 08/20/19 1207 08/20/19 1304 08/20/19 1723  BP: 139/63 134/70 118/64 109/65  Pulse: (!) 117 (!) 128 (!)  128 (!) 108  Resp: (!) 21 19 (!) 21 18  Temp:  (!) 102.6 F (39.2 C) (!) 102.9 F (39.4 C) 98.9 F (37.2 C)  TempSrc:  Oral Oral Oral  SpO2: 97% 97% 93% 96%  Weight:      Height:        Intake/Output Summary (Last 24 hours) at 08/20/2019 2023 Last data filed at 08/20/2019 1725 Gross per 24 hour  Intake 205.24 ml  Output 2675 ml  Net -2469.76 ml   [employer]  08/18/19 0107 08/18/19 0400 08/20/19 0414  Weight: 72.4 kg 72.4 kg 68.9 kg   Examination: Physical Exam:  Constitutional: Extremely thin Caucasian male in no acute distress today Eyes: Lids and conjunctivae normal, sclerae anicteric  ENMT: External Ears, Nose appear normal. Grossly normal hearing. Poor Dentition Neck: Appears normal, supple, no cervical masses, normal ROM, no appreciable thyromegaly; no JVD Respiratory: Diminished to auscultation bilaterally, no wheezing, rales, rhonchi or crackles. Normal respiratory effort and patient is not tachypenic. No accessory muscle use.  Cardiovascular: Tachycardic rate but regular rhythm, no murmurs / rubs / gallops. S1 and S2 auscultated.  Continues to have 1+ lower extremity edema which is improving though Abdomen: Soft, non-tender, non-distended. No masses palpated. No appreciable hepatosplenomegaly. Bowel sounds positive.  GU: Deferred. Musculoskeletal: No clubbing / cyanosis of digits/nails. No joint deformity upper and lower extremities.  Skin: No rashes, lesions, ulcers on limited skin evaluation but does have some palpable lymphadenopathy. No induration; Warm and dry.  Neurologic: CN 2-12 grossly intact with no focal deficits.  Romberg sign cerebellar reflexes not assessed.  Psychiatric: Normal judgment and insight. Alert and oriented x 3. Pleasant and normal mood and appropriate affect today    Data Reviewed: I have personally reviewed following labs and imaging studies  CBC: Recent Labs  Lab 08/17/19 1914 08/18/19 0749 08/18/19 1803 08/19/19 0235  08/19/19 1921 08/20/19 0216  WBC 3.4* 3.8*  --  3.8*  --  4.2  NEUTROABS 0.9* 1.3*  --  1.0*  --  1.2*  HGB 4.6* 7.0* 7.5* 7.2* 8.5* 7.8*  HCT 14.3* 21.3* 22.5* 21.7* 26.2* 23.9*  MCV 85.1 85.2  --  84.8  --  85.7  PLT 39* 42*  --  47*  --  38*   Basic Metabolic Panel: Recent Labs  Lab 08/17/19 1914 08/18/19 0749 08/19/19 0235 08/20/19 0216  NA 126* 128* 127* 131*  K 4.1 3.9 3.6 3.5  CL 92* 96* 92* 96*  CO2 19* 21* 22 21*  GLUCOSE 78 89 118* 107*  BUN _0 CREATININE 0.52* 0.53* 0.68 0.63  CALCIUM 8.6* 8.6* 8.3* 8.0*  MG  --  1.5* 1.5* 1.7  PHOS  --  3.4 3.0 3.0   GFR: Estimated Creatinine Clearance: 100.5 mL/min (by C-G formula based on SCr of 0.63 mg/dL). Liver Function Tests: Recent Labs  Lab 08/18/19 0749 08/19/19 0235 08/20/19 0216  AST 69*   67* 67* 67*  ALT _1 ALKPHOS 160*   171* 147* 153*  BILITOT 1.4*   1.4* 1.3* 1.0  PROT 4.4*   4.6* 4.3* 4.3*  ALBUMIN 2.0*   2.0* 1.8* 1.8*   No results for input(s): LIPASE, AMYLASE in the last 168 hours. No results for input(s): AMMONIA in the last 168 hours. Coagulation Profile: Recent Labs  Lab 08/18/19 1314  INR 1.3*   Cardiac Enzymes: No results for input(s): CKTOTAL, CKMB, CKMBINDEX, TROPONINI in the last 168 hours. BNP (last 3 results) No results for input(s): PROBNP in the last 8760 hours. HbA1C: No results for input(s): HGBA1C in the last 72 hours. CBG: No results for input(s): GLUCAP in the last 168 hours. Lipid Profile: No results for input(s): CHOL, HDL, LDLCALC, TRIG, CHOLHDL, LDLDIRECT in the last 72 hours. Thyroid Function Tests: No results for input(s): TSH, T4TOTAL, FREET4, T3FREE, THYROIDAB in the last 72 hours. Anemia Panel: Recent Labs    08/18/19 0749  VITAMINB12 1,022*  FOLATE 5.4*  FERRITIN 1,444*  TIBC 213*  IRON 87  RETICCTPCT 2.0   Sepsis Labs: Recent Labs  Lab 08/18/19 0134 08/18/19 0437 08/18/19 0749 08/18/19 1032 08/19/19 0235 08/20/19 0216   PROCALCITON  --   --  2.60  --  3.00 2.31  LATICACIDVEN 6.0* 4.6* 3.0* 2.9*  --   --     Recent Results (from the past 240 hour(s))  Culture, Urine     Status: None   Collection Time: 08/17/19  7:14 PM   Specimen: Urine, Clean Catch  Result Value Ref Range Status   Specimen Description   Final    URINE, CLEAN CATCH Performed at [facility], [place] [address], [place], [place] [address]    Special Requests   Final    NONE Performed at [facility], [place] [address], [place], [place] [address]    Culture   Final    NO GROWTH Performed at [facility], [place] [address] [place], [place] [address]    Report Status 08/19/2019 FINAL  Final  SARS CORONAVIRUS 2 (TAT 6-24 HRS) Nasopharyngeal Nasopharyngeal Swab     Status: None   Collection Time: 08/17/19  8:52 PM   Specimen: Nasopharyngeal Swab  Result Value Ref Range Status   SARS Coronavirus 2 NEGATIVE NEGATIVE Final    Comment: (NOTE) SARS-CoV-2 target nucleic acids are NOT DETECTED. The SARS-CoV-2 RNA is generally detectable in upper and lower respiratory specimens during the acute phase of infection. Negative results do not preclude SARS-CoV-2 infection, do not rule out co-infections with other pathogens, and should not be used as the sole basis for treatment or other patient management decisions. Negative results must be combined with clinical observations, patient history, and epidemiological information. The expected result is Negative. Fact Sheet for Patients: SugarRoll.be Fact Sheet for Healthcare Providers: https://www.woods-mathews.com/ This test is not yet approved or cleared by the [place] FDA and  has been authorized for detection and/or diagnosis of SARS-CoV-2 by FDA under an Emergency Use Authorization (EUA). This EUA will remain  in effect (meaning this test can be used) for the duration of the COVID-19 declaration under  Section 56 4(b)(1) of the Act, 21 U.S.C. section 360bbb-3(b)(1), unless the authorization is terminated or revoked sooner. Performed at [facility], [place] [address] [place], [place] [address]   Blood Culture (routine x 2)     Status: Abnormal   Collection Time: 08/17/19  8:55 PM   Specimen: BLOOD  Result Value Ref Range Status   Specimen Description   Final    BLOOD LEFT ANTECUBITAL Performed at [facility] [address] [place], [place] [address]    Special Requests   Final    BOTTLES DRAWN AEROBIC AND ANAEROBIC Blood Culture adequate volume Performed at [facility] [address], [place], [place] [address]    Culture  Setup Time   Final    GRAM POSITIVE COCCI AEROBIC BOTTLE ONLY CRITICAL RESULT CALLED TO, READ BACK BY AND VERIFIED WITH: J. [place], PHARMD ([facility]) AT 2482 ON 08/18/19 BY C. JESSUP, MT.    Culture (A)  Final    STAPHYLOCOCCUS SPECIES (COAGULASE NEGATIVE) THE SIGNIFICANCE OF ISOLATING THIS ORGANISM FROM A SINGLE SET OF BLOOD CULTURES WHEN MULTIPLE SETS ARE DRAWN IS UNCERTAIN. PLEASE NOTIFY THE MICROBIOLOGY DEPARTMENT WITHIN ONE WEEK IF SPECIATION AND SENSITIVITIES ARE REQUIRED. Performed at [facility], [place] [address], [place], [place] [address]    Report Status 08/19/2019 FINAL  Final  Blood Culture (routine x  2)     Status: None (Preliminary result)   Collection Time: 08/17/19  8:55 PM   Specimen: BLOOD  Result Value Ref Range Status   Specimen Description   Final    BLOOD RIGHT ANTECUBITAL Performed at [facility] [address] [place], [place] [address]    Special Requests   Final    BOTTLES DRAWN AEROBIC AND ANAEROBIC Blood Culture results may not be optimal due to an excessive volume of blood received in culture bottles Performed at [facility] [address] [place], [place] [address]    Culture   Final    NO GROWTH 3 DAYS Performed at [facility], [place] [address], [place], [place] [address]    Report Status PENDING  Incomplete  MRSA PCR Screening     Status: None   Collection Time: 08/18/19 12:22 AM   Specimen: Nasal Mucosa; Nasopharyngeal  Result Value Ref Range Status   MRSA by PCR NEGATIVE NEGATIVE Final    Comment:        The GeneXpert MRSA Assay (FDA approved for NASAL specimens only), is one component of a comprehensive MRSA colonization surveillance program. It is not intended to diagnose MRSA infection nor to guide or monitor treatment for MRSA infections. Performed at [facility], [place] [address] [place], [place] [address]   Culture, blood (routine x 2)     Status: None (Preliminary result)   Collection Time: 09-13-2019  5:48 PM   Specimen: BLOOD RIGHT HAND  Result Value Ref Range Status   Specimen Description   Final    BLOOD RIGHT HAND Performed at [facility], [place] [address], [place], [place] [address]    Special Requests   Final    BOTTLES DRAWN AEROBIC ONLY Blood Culture adequate volume Performed at [facility] [address] [place], [place] [address]    Culture   Final    NO GROWTH < 12 HOURS Performed at [facility] [address] [place], [place] [address]    Report Status PENDING  Incomplete  Culture, blood (routine x 2)     Status: None (Preliminary result)   Collection Time: 09/13/2019  5:48 PM   Specimen: BLOOD LEFT HAND  Result Value Ref Range Status   Specimen Description   Final    BLOOD LEFT HAND Performed at [facility], [place] [address] [place], [place] [address]    Special Requests   Final    BOTTLES DRAWN AEROBIC ONLY Blood Culture adequate volume Performed at [facility] [address], [place], [place] [address]    Culture   Final    NO GROWTH < 12 HOURS Performed at [facility] [address], [place], [place] [address]    Report Status PENDING  Incomplete     Radiology  Studies: Ct Biopsy  Result Date: 13-Sep-2019 INDICATION: 58 year old male with pancytopenia. He presents for bone marrow biopsy as an inpatient. EXAM: CT GUIDED BONE MARROW ASPIRATION AND CORE BIOPSY Interventional Radiologist:  Criselda Peaches, MD MEDICATIONS: None. ANESTHESIA/SEDATION: 2 mg Versed were administered for anxiolysis. This does not constitute moderate sedation. FLUOROSCOPY TIME:  None. COMPLICATIONS: None immediate. Estimated blood loss: <25 mL PROCEDURE: Informed written consent was obtained from the patient after a thorough discussion of the procedural risks, benefits and alternatives. All questions were addressed. Maximal Sterile Barrier Technique was utilized including caps, mask, sterile gowns, sterile gloves, sterile  drape, hand hygiene and skin antiseptic. A timeout was performed prior to the initiation of the procedure. The patient was positioned prone and non-contrast localization CT was performed of the pelvis to demonstrate the iliac marrow spaces. Maximal barrier sterile technique utilized including caps, mask, sterile gowns, sterile gloves, large sterile drape, hand hygiene, and betadine prep. Under sterile conditions and local anesthesia, an 11 gauge coaxial bone biopsy needle was advanced into the right iliac marrow space. Needle position was confirmed with CT imaging. Initially, bone marrow aspiration was performed. Next, the 11 gauge outer cannula was utilized to obtain a right iliac bone marrow core biopsy. Needle was removed. Hemostasis was obtained with compression. The patient tolerated the procedure well. Samples were prepared with the cytotechnologist. IMPRESSION: Technically successful CT-guided bone marrow aspiration and biopsy of the right iliac bone. Electronically Signed   By: Jacqulynn Cadet M.D.   On: 08/19/2019 10:38   Dg Chest Port 1 View  Result Date: 08/20/2019 CLINICAL DATA:  Shortness of breath EXAM: PORTABLE CHEST 1 VIEW COMPARISON:  Two days ago  FINDINGS: Normal heart size. Haziness of the bilateral lower chest from layering pleural fluid with atelectasis by recent CT. No air bronchogram. No pneumothorax. IMPRESSION: Pleural effusions and atelectasis that have increased from 2 days ago. Electronically Signed   By: Monte Fantasia M.D.   On: 08/20/2019 07:33   Dg Chest Port 1 View  Result Date: 08/18/2019 CLINICAL DATA:  Shortness of breath EXAM: PORTABLE CHEST 1 VIEW COMPARISON:  08/18/2019 FINDINGS: Heart is normal size. Bilateral perihilar and lower lobe opacities. No visible effusions. No acute bony abnormality. IMPRESSION: Perihilar and lower lobe opacities could reflect atelectasis, edema or infection. Electronically Signed   By: Rolm Baptise M.D.   On: 08/18/2019 21:19   Ct Bone Marrow Biopsy & Aspiration  Result Date: 08/19/2019 INDICATION: 58 year old male with pancytopenia. He presents for bone marrow biopsy as an inpatient. EXAM: CT GUIDED BONE MARROW ASPIRATION AND CORE BIOPSY Interventional Radiologist:  Criselda Peaches, MD MEDICATIONS: None. ANESTHESIA/SEDATION: 2 mg Versed were administered for anxiolysis. This does not constitute moderate sedation. FLUOROSCOPY TIME:  None. COMPLICATIONS: None immediate. Estimated blood loss: <25 mL PROCEDURE: Informed written consent was obtained from the patient after a thorough discussion of the procedural risks, benefits and alternatives. All questions were addressed. Maximal Sterile Barrier Technique was utilized including caps, mask, sterile gowns, sterile gloves, sterile drape, hand hygiene and skin antiseptic. A timeout was performed prior to the initiation of the procedure. The patient was positioned prone and non-contrast localization CT was performed of the pelvis to demonstrate the iliac marrow spaces. Maximal barrier sterile technique utilized including caps, mask, sterile gowns, sterile gloves, large sterile drape, hand hygiene, and betadine prep. Under sterile conditions and local  anesthesia, an 11 gauge coaxial bone biopsy needle was advanced into the right iliac marrow space. Needle position was confirmed with CT imaging. Initially, bone marrow aspiration was performed. Next, the 11 gauge outer cannula was utilized to obtain a right iliac bone marrow core biopsy. Needle was removed. Hemostasis was obtained with compression. The patient tolerated the procedure well. Samples were prepared with the cytotechnologist. IMPRESSION: Technically successful CT-guided bone marrow aspiration and biopsy of the right iliac bone. Electronically Signed   By: Jacqulynn Cadet M.D.   On: 08/19/2019 10:38   Scheduled Meds:  sodium chloride   Intravenous Once   Chlorhexidine Gluconate Cloth  6 each Topical Daily   feeding supplement (ENSURE ENLIVE)  237 mL Oral BID  BM   feeding supplement (PRO-STAT SUGAR FREE 64)  30 mL Oral BID   folic acid  1 mg Oral Daily   hydrocortisone   Rectal TID   ipratropium  0.5 mg Nebulization BID   levalbuterol  0.63 mg Nebulization BID   mouth rinse  15 mL Mouth Rinse BID   multivitamin with minerals  1 tablet Oral Daily   nicotine  7 mg Transdermal Daily   pantoprazole  20 mg Oral Daily   Continuous Infusions:  ceFEPime (MAXIPIME) IV 2 g (08/20/19 1500)   sodium chloride      LOS: 3 days   Kerney Elbe, DO [facility] PAGER is on AMION  If 7PM-7AM, please contact night-coverage [email or web address]

## 2019-08-20 NOTE — Progress Notes (Signed)
PCCM  BMBx path pending. We will be available if tissue bx needed from chest. However patient has additional superficial sites in axilla and cervical nodes.   Please call if needed.   Garner Nash, DO [facility] 08/20/2019 11:46 AM

## 2019-08-21 ENCOUNTER — Inpatient Hospital Stay (HOSPITAL_COMMUNITY): Payer: Medicaid Other

## 2019-08-21 DIAGNOSIS — R161 Splenomegaly, not elsewhere classified: Secondary | ICD-10-CM

## 2019-08-21 LAB — CBC WITH DIFFERENTIAL/PLATELET
Abs Immature Granulocytes: 0.09 10*3/uL — ABNORMAL HIGH (ref 0.00–0.07)
Basophils Absolute: 0 10*3/uL (ref 0.0–0.1)
Basophils Relative: 0 %
Eosinophils Absolute: 0 10*3/uL (ref 0.0–0.5)
Eosinophils Relative: 0 %
HCT: 22.6 % — ABNORMAL LOW (ref 39.0–52.0)
Hemoglobin: 7.4 g/dL — ABNORMAL LOW (ref 13.0–17.0)
Immature Granulocytes: 2 %
Lymphocytes Relative: 56 %
Lymphs Abs: 2.6 10*3/uL (ref 0.7–4.0)
MCH: 27.6 pg (ref 26.0–34.0)
MCHC: 32.7 g/dL (ref 30.0–36.0)
MCV: 84.3 fL (ref 80.0–100.0)
Monocytes Absolute: 0.5 10*3/uL (ref 0.1–1.0)
Monocytes Relative: 11 %
Neutro Abs: 1.5 10*3/uL — ABNORMAL LOW (ref 1.7–7.7)
Neutrophils Relative %: 31 %
Platelets: 38 10*3/uL — ABNORMAL LOW (ref 150–400)
RBC: 2.68 MIL/uL — ABNORMAL LOW (ref 4.22–5.81)
RDW: 22.7 % — ABNORMAL HIGH (ref 11.5–15.5)
WBC: 4.7 10*3/uL (ref 4.0–10.5)
nRBC: 0 % (ref 0.0–0.2)

## 2019-08-21 LAB — COMPREHENSIVE METABOLIC PANEL
ALT: 24 U/L (ref 0–44)
AST: 61 U/L — ABNORMAL HIGH (ref 15–41)
Albumin: 1.8 g/dL — ABNORMAL LOW (ref 3.5–5.0)
Alkaline Phosphatase: 175 U/L — ABNORMAL HIGH (ref 38–126)
Anion gap: 13 (ref 5–15)
BUN: 14 mg/dL (ref 6–20)
CO2: 24 mmol/L (ref 22–32)
Calcium: 8.7 mg/dL — ABNORMAL LOW (ref 8.9–10.3)
Chloride: 97 mmol/L — ABNORMAL LOW (ref 98–111)
Creatinine, Ser: 0.61 mg/dL (ref 0.61–1.24)
GFR calc Af Amer: >60 mL/min (ref >60)
GFR calc non Af Amer: >60 mL/min (ref >60)
Glucose, Bld: 80 mg/dL (ref 70–99)
Potassium: 3.8 mmol/L (ref 3.5–5.1)
Sodium: 134 mmol/L — ABNORMAL LOW (ref 135–145)
Total Bilirubin: 0.8 mg/dL (ref 0.3–1.2)
Total Protein: 4.4 g/dL — ABNORMAL LOW (ref 6.5–8.1)

## 2019-08-21 LAB — PHOSPHORUS: Phosphorus: 3.4 mg/dL (ref 2.5–4.6)

## 2019-08-21 LAB — MAGNESIUM: Magnesium: 1.5 mg/dL — ABNORMAL LOW (ref 1.7–2.4)

## 2019-08-21 MED ORDER — FUROSEMIDE 10 MG/ML IJ SOLN
20.0000 mg | Freq: Once | INTRAMUSCULAR | Status: AC
  Administered 2019-08-21: 20 mg via INTRAVENOUS
  Filled 2019-08-21: qty 2

## 2019-08-21 MED ORDER — MAGNESIUM SULFATE 2 GM/50ML IV SOLN
2.0000 g | Freq: Once | INTRAVENOUS | Status: AC
  Administered 2019-08-21: 2 g via INTRAVENOUS
  Filled 2019-08-21: qty 50

## 2019-08-21 MED ORDER — FUROSEMIDE 10 MG/ML IJ SOLN
40.0000 mg | Freq: Once | INTRAMUSCULAR | Status: AC
  Administered 2019-08-21: 40 mg via INTRAVENOUS
  Filled 2019-08-21: qty 4

## 2019-08-21 MED ORDER — FUROSEMIDE 10 MG/ML IJ SOLN
40.0000 mg | Freq: Two times a day (BID) | INTRAMUSCULAR | Status: DC
  Administered 2019-08-21 – 2019-08-25 (×8): 40 mg via INTRAVENOUS
  Filled 2019-08-21 (×8): qty 4

## 2019-08-21 MED ORDER — SODIUM CHLORIDE 0.9% IV SOLUTION
Freq: Once | INTRAVENOUS | Status: AC
  Administered 2019-08-21: 18:00:00 via INTRAVENOUS

## 2019-08-21 NOTE — Progress Notes (Signed)
PROGRESS NOTE    [name]  PXT:[id number] DOB: September 04, 1961 DOA: 08/17/2019 PCP: Patient, No Pcp Per   Brief Narrative:  HPI per Dr. Shela Leff on 08/17/2019 Isaac Pierce is a 58 y.o. male with a past medical history of chronic back pain, GERD, anxiety, homelessness presenting with a chief complaint of shortness of breath.  Patient states he returned here from [place] about 5 weeks ago and since then has been doing very poorly.  He has no energy and stays in bed all the time.  He has no appetite.  He is feeling short of breath even at rest and his breathing has been getting progressively worse.  He sometimes has a cough productive of white sputum.  He has lost a significant amount of weight.  His legs have been very swollen and his abdomen is also swollen.  He has noticed lumps underneath his axilla and on both sides of his neck.  He wakes up at night drenched in sweat.  States both his parents died from [place].  Denies personal history of malignancy.  He was previously smoking 1 to 2 packs of cigarettes per day but since he has been sick is now only smoking 1 to 4 cigarettes/day.  ED Course: Tachycardic with heart rate in the 110s to 120s.  Tachypneic with respiratory rate in the 20s.  Blood pressure intermittently soft and patient received 1 L normal saline bolus.  Oxygen saturation 96-100% on room air.  Placed on 2 L supplemental oxygen via nasal cannula for increased work of breathing.  Labs showing pancytopenia (WBC count 3.4, hemoglobin 4.6, platelet count 39,000).  FOBT negative.  Other lab abnormalities seen:  Lactic acid 6.4.  Sodium 126.  Bicarb 19, anion gap 15.  BNP normal.  D-dimer elevated at 5.16.  High-sensitivity troponin mildly elevated at 22.  EKG not suggestive of ACS. Pending labs: Pathologist smear review, blood culture x2, SARS-CoV-2 test Chest x-ray showing mild central vascular congestion with small bilateral effusions.  Atelectasis or mild pneumonia at the left  greater than right lung base. Chest CT angiogram (limited exam) negative for PE.  Showing bulky mediastinal, bilateral hilar, and bilateral axillary lymphadenopathy.  Findings concerning for primary lymphoproliferative process such as lymphoma.  Metastatic disease secondary to unknown primary is also a consideration.  Multiple bilateral noncalcified pulmonary nodules measuring up to 10 mm.  Small bilateral pleural effusions, left greater than right with associated compressive atelectasis.  Hepatosplenomegaly within the visualized upper abdomen with ill-defined area of low density within the peripheral aspect of the spleen measuring 3.7 x 2.8 cm, findings concerning for lymphomatous involvement versus metastatic disease versus possible sequela of splenic injury. 2 units PRBCs ordered. ED provider discussed the case with Dr. Julien Nordmann from oncology, will consult in a.m.  **Interim History During the course of his hospitalization he had severe respiratory distress and was administered IV Lasix with improvement.  Pulmonary is consulted for further evaluation recommendations and medical oncology recommending biopsy and he underwent a lymph node biopsy of the dominant right inguinal lymph node and a bone marrow biopsy.  He is status post 4 units of PRBCs and blood count is stabilized but still on the lower side.  We will give another dose of IV Lasix today given his volume overload. Patient continues to spike temperatures and will continue IV cefepime for now.  Do not know if the fevers are related to likely malignancy versus secondary to infection.  Patient remains significantly volume overloaded and will continue Lasix  and obtain abdominal ultrasound as he continues to have some abdominal distention that he feels has worsened.   Assessment & Plan:   Principal Problem:   Severe sepsis ([place]) Active Problems:   Malignancy (HCC)   Pancytopenia (HCC)   Dyspnea   Pleural effusion   Hyponatremia    Protein-calorie malnutrition, severe  Lactic Acidosis/ Concern for severe sepsis from unknown source and ? Bacteremia -Lactic acid wassignificantly elevated at 6.8 on admission -Although this could be related to underlying malignancy, there remains concern for severe sepsis given tachycardia, tachypnea, and soft blood pressure readings.  No fever.  Does have lymphopenia.  Chest CTA not suggestive of pneumonia and is negative for PE.  UA not suggestive of infection. -Received 1 L normal saline bolus and was given more fluid and now is KVO'd   -Continue to monitor blood pressure closely and will not give additional fluid boluses to keep MAP >65 given severe Volume Overload.  Given a dose of IV Lasix again today -Give broad-spectrum antibiotics including vancomycin and Zosyn but will change to IV Cefepime; Will continue IV Vancomycin today and de-escalate later today -Checked procalcitonin level and was 2.60 and worsened today to 3.00 and yesterday was 2.31 -Trending Lactic Acid and trended down from 6.8 -> 2.9 -Blood culture x2 pending aerobic set of 1 blood cultures gram-positive cocci (Staph Species) could be contaminant; Repeat Blood Cx 08/19/2019 showed no growth to date at 2 Days  -Urine culture showed no Growth  -SARS-CoV-2 test Negative  -Repeat CXR yesterday evening showed "Pleural effusions and atelectasis that have increased from 2 days ago." -Changed IV Zosyn to IV cefepime will continue; IV vancomycin is now stopped -Patient continues to spike temperatures and continues to remain tachycardic and a little tachypneic; blood pressure is improved; yesterday afternoon he had a temperature of 102.9 he is tachypneic and tachycardic but this is improved after Tylenol administration after his fever has normalized  Concern for malignancy/ lymphoproliferative disorder -Patient is presenting with a 5-week history of dyspnea, night sweats, anorexia, significant weight loss, and diffuse  lymphadenopathy.  -Chest CT angiogram showing bulky mediastinal, bilateral hilar, and bilateral axillary lymphadenopathy.  Findings concerning for primary lymphoproliferative process such as lymphoma.  Metastatic disease secondary to unknown primary is also consideration.  Also showing multiple bilateral noncalcified pulmonary nodules measuring up to 10 mm.  Hepatosplenomegaly within the visualized upper abdomen with ill-defined area of low density within the peripheral aspect of the spleen measuring 3.7 x 2.8 cm, findings concerning for lymphomatous involvement versus metastatic disease versus possible sequela of splenic injury. -ED provider discussed the case with Dr. Julien Nordmann from oncology -CT abdomen pelvis for further staging and showed "Massive splenomegaly with abdominal and inguinal lymphadenopathy. Findings are suggestive for a lymphoproliferative process such as lymphoma. Evidence for multiple splenic infarcts likely related to the splenomegaly. Cannot exclude a splenic lesion along the left posterior aspect measuring up to 3.8 cm.  Evidence for fluid overload state with diffuse subcutaneous edema, bilateral pleural effusions and ascites.  Re-demonstration pleural-based nodularity at the lung bases. There is also septal thickening and a nodular structure in the right lower lobe. Findings are compatible with a neoplastic or lymphoproliferative process.  No discrete liver lesions.  Aortic Atherosclerosis.Concern for at least mild stenosis involving the proximal SMA. Ectasia of the celiac trunk." -Since concerning for lymphoma versus metastatic disease from an unknown primary and patient is to undergo an ultrasound-guided lymph node biopsy by IR and had a technically successful ultrasound-guided biopsy of  the dominant right inguinal lymph node -Pulmonary was also consulted and recommending biopsy and they doubt that he needs mediastinoscopy -Underwent a CT-guided aspirate and core biopsy of the right  iliac bone for bone marrow biopsy yesterday -Biopsies are still pending -Continues to spike temperatrues and ?If related to Lymphoproliferative disorder; Continuing Abx  Pancytopenia (leukopenia, severe anemia, and severe thrombocytopenia) -Suspect related to malignancy or lymphoproliferative disorder. On Admission WBC count 3.4, hemoglobin 4.6, platelet count 39,000 on admission.  Blood loss anemia from acute GI bleed less likely given negative FOBT. -Anemia panel done and showed an iron level of 87, TIBC 126, TIBC 230, saturation ratios of 41%, ferritin of 1444, folate of 5.4, and vitamin B12 level 1022 -Patient's hemoglobin/hematocrit went from 4.6/14.3 and was 7.8/23.9 after 4 units and and now trending down again at 7.4/22.6 so will transfuse 1 more unit of pRBC -Patient is platelet count is now 38,000 and WBC is 4.7 -Oncology consulted and recommending PLT transfusions for hemoglobin less than 8 and recommended platelet transfusion of platelets drop below 20,000 or active bleeding -Pathologist smear review ordered  -Medical oncology is also started the patient on folic acid 1 mg p.o. 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   -Repeat CXR this AM showed "Stable exam. Small bilateral pleural effusions with bibasilar collapse/consolidation." -BNP normal at 93.7 and 91.8   -Weaned to room air -BP improved and given IV Lasix again today given his continued chest x-ray findings -Will obtain a Thoracentesis  -Continuous pulse oximetry and maintain O2 saturation greater than 94%  -Continue supplemental oxygen via nasal cannula wean O2 as tolerated and currently not wearing Supplemental O2   Hyponatremia, improving -Initially thought to be from Hypovolemia but patient had significant JVD and LE Edema -Stopped IVF and started Diuresis and given another dose of IV Lasix this morning and will give another dose of Lasix later this evening and make it BID  -Initially Patient may have appeared dehydrated as he Endorsed decreased p.o. intake for over a month.  -Sodium 126 on admission and repeat this AM was 134 -IV fluid hydration now KVO'd  -Continue to monitor CMP in AM  -Check serum osmolarity  Normal anion gap metabolic acidosis -Bicarb 19, anion gap 15 on admission and repeat showed an anion gap of 13, CO2 24, and a chloride of 97.  Patient is not on a diuretic and not endorsing diarrhea we have started him on Lasix -IV fluid hydration as above and is now [facility] and given another dose of IV Lasix  -Continue to monitor CMP  Bilateral lower extremity edema/ concern for DVT, improved -Patient has significant bilateral lower extremity edema.   -D-dimer elevated at 5.16.  CT angiogram negative for PE.  There remains concern for DVT given imaging findings consistent with malignancy. -Bilateral lower extremity Dopplers no evidence of the DVT in the right left lower extremity and there is no cystic structures found in popliteal fossa of both extremities -C/w IV Lasix today again  Hypomagnesemia -Patient magnesium level this morning is 1.5 -Replete with IV mag sulfate 2 g again  -Continue to monitor replete as necessary -Repeat magnesium level in  a.m.  Troponinemia High-sensitivity troponin mildly elevated at 22 on admission .   -EKG not suggestive of ACS.  Patient is not complaining of chest pain and appears comfortable. -C/w Cardiac monitoring -Trend troponin and a trended up to 128  Tobacco Use -Smoking cessation counseling given and was started on a nicotine  patch  Volume Overload -In the setting possible CHF versus hypoalbuminemia but CHF ruled out. -BNP was normal -We will check cardiac echocardiogram and he was given IV Lasix again today and increase it to BID and give 20 mg prior to blood administration  -Currently -4.460 L since Admission  -Will give TED Hose  -Continue monitor signs and symptoms of volume overload and strict I's and O's and daily weights   Anxiety -Continue home Hydroxyzine  Chronic Back Pain -Tylenol as needed  GERD -Continue PPI with Pantoprazole 20 mg Daily   Homelessness -Social work consulted for further assistance   HIV screening The patient falls between the ages of 13-64 and should be screened for HIV, therefore HIV testing ordered and is Non-Reactive   Hyperbilirubinemia and abnormal LFTs -AST was 67 ALT was 69 and T bili was 1.4; Now AST is 61, ALT is 24, T bili is 0.8 and trending down -Continue to monitor and trend and check acute hepatitis panel and right upper quadrant ultrasound if necessary -Repeat CMP in a.m.  Severe malnutrition in the context of chronic illness -Nutritionist consulted for further evaluation recommendations -Nutritionist recommended Ensure Enlive p.o. twice daily, prostat 30 mL p.o. twice daily, multivitamin with mineral daily p.o. and encouraging p.o. intake  Abdominal Distention -In the setting of his Splenomegaly from process as above -Checked KUB and showed "No bowel dilation to suggest obstruction. Bowel is displaced inferiorly by the markedly enlarged spleen. Soft tissues are poorly defined. No evidence of a renal or ureteral stone. There are scattered aortic and iliac artery atherosclerotic calcifications."  -Checked Abdominal U/S and showed "No convincing acute finding. There are small gallstones, gallbladder sludge and 1 small polyp as well as pericholecystic fluid, but no sonographic Murphy's sign. Gallbladder is also only mildly to moderately distended. No  convincing acute cholecystitis. Hepatosplenomegaly. Increased echogenicity of the liver consistent with hepatic steatosis. No liver mass. Bilateral pleural effusions. Mild right hydronephrosis. Hypoechoic to anechoic right renal masses that are likely cysts but not fully characterized."  DVT prophylaxis: SCDs Code Status: FULL CODE Family Communication: No family present at bedside  Disposition Plan: Transferred to Telemetry now that he has improved   Consultants:   Medical Oncology  Pulmonary  Interventional Radiology   Procedures:  ECHOCARDIOGRAM IMPRESSIONS    1. Left ventricular ejection fraction, by visual estimation, is 60 to 65%. The left ventricle has normal function. There is no left ventricular hypertrophy.  2. Indeterminate diastolic filling due to E-A fusion.  3. The left ventricle has no regional wall motion abnormalities.  4. Global right ventricle has normal systolic function.The right ventricular size is normal. No increase in right ventricular wall thickness.  5. Left atrial size was mildly dilated.  6. Right atrial size was normal.  7. Trivial pericardial effusion is present.  8. The mitral valve is normal in structure. No evidence of mitral valve regurgitation. No evidence of mitral stenosis.  9. The tricuspid valve is normal in structure. Tricuspid valve regurgitation is trivial. 10. The aortic valve is tricuspid. Aortic valve regurgitation is not visualized. No evidence of aortic valve sclerosis or stenosis. 11. TR signal is inadequate for assessing pulmonary artery systolic  pressure. 12. The inferior vena cava is normal in size with greater than 50% respiratory variability, suggesting right atrial pressure of 3 mmHg.  FINDINGS  Left Ventricle: Left ventricular ejection fraction, by visual estimation, is 60 to 65%. The left ventricle has normal function. The left ventricle has no regional wall motion abnormalities. The left ventricular internal cavity size  was the left  ventricle is normal in size. There is no left ventricular hypertrophy. Indeterminate diastolic filling due to E-A fusion.  Right Ventricle: The right ventricular size is normal. No increase in right ventricular wall thickness. Global RV systolic function is has normal systolic function.  Left Atrium: Left atrial size was mildly dilated.  Right Atrium: Right atrial size was normal in size  Pericardium: Trivial pericardial effusion is present.  Mitral Valve: The mitral valve is normal in structure. No evidence of mitral valve stenosis by observation. No evidence of mitral valve regurgitation.  Tricuspid Valve: The tricuspid valve is normal in structure. Tricuspid valve regurgitation is trivial.  Aortic Valve: The aortic valve is tricuspid. Aortic valve regurgitation is not visualized. The aortic valve is structurally normal, with no evidence of sclerosis or stenosis.  Pulmonic Valve: The pulmonic valve was normal in structure. Pulmonic valve regurgitation is not visualized.  Aorta: The aortic root is normal in size and structure.  Venous: The inferior vena cava is normal in size with greater than 50% respiratory variability, suggesting right atrial pressure of 3 mmHg.  IAS/Shunts: No atrial level shunt detected by color flow Doppler.     LEFT VENTRICLE PLAX 2D LVIDd:         5.50 cm Diastology LVIDs:         3.40 cm LV e' lateral: 16.20 cm/s LV PW:         0.90 cm LV e' medial:  10.40 cm/s LV IVS:        0.90 cm LV SV:         100 ml LV SV Index:   52.54    RIGHT VENTRICLE RV S prime:     23.40 cm/s TAPSE (M-mode): 2.7 cm  LEFT ATRIUM             Index LA diam:        3.80 cm 1.98 cm/m LA Vol (A2C):   87.1 ml 45.46 ml/m LA Vol (A4C):   61.0 ml 31.84 ml/m LA Biplane Vol: 72.9 ml 38.05 ml/m  AORTIC VALVE LVOT Vmax:   149.00 cm/s LVOT Vmean:  102.000 cm/s LVOT VTI:    0.266 m   AORTA Ao Root diam: 3.10 cm    SHUNTS Systemic VTI:  0.27 m   Antimicrobials:  Anti-infectives (From admission, onward)   Start     Dose/Rate Route Frequency Ordered Stop   08/18/19 2200  ceFEPIme (MAXIPIME) 2 g in sodium chloride 0.9 % 100 mL IVPB     2 g 200 mL/hr over 30 Minutes Intravenous Every 8 hours 08/18/19 1534     08/18/19 1200  vancomycin (VANCOCIN) 1,250 mg in sodium chloride 0.9 % 250 mL IVPB  Status:  Discontinued     1,250 mg 166.7 mL/hr over 90 Minutes Intravenous Every 12 hours 08/18/19 0621 08/19/19 1733   08/18/19 0600  piperacillin-tazobactam (ZOSYN) IVPB 3.375 g     3.375 g 12.5 mL/hr over 240 Minutes Intravenous Every 8 hours 08/17/19 2251 08/18/19 1800   08/17/19 2300  vancomycin (VANCOCIN) 1,500 mg in sodium chloride 0.9 % 500 mL IVPB  1,500 mg 250 mL/hr over 120 Minutes Intravenous  Once 08/17/19 2250 08/18/19 0332   08/17/19 2300  piperacillin-tazobactam (ZOSYN) IVPB 3.375 g     3.375 g 100 mL/hr over 30 Minutes Intravenous  Once 08/17/19 2250 08/18/19 0131     Subjective: Seen and examined at bedside that he continues to feel little bit better but his main concern was his abdominal distention.  No nausea or vomiting but also states that his legs are more swollen.  No other concerns or complaints at this time and gets very anxious.  Objective: Vitals:   08/21/19 0313 08/21/19 0652 08/21/19 0736 08/21/19 1100  BP: (!) 103/56 (!) 114/59  (!) 111/57  Pulse: (!) 105 (!) 127  (!) 109  Resp: '18 19  20  ' Temp: 98.4 F (36.9 C) 99.8 F (37.7 C)  99.9 F (37.7 C)  TempSrc: Oral Oral  Oral  SpO2: 91% 94% 94% 95%  Weight:      Height:        Intake/Output Summary (Last 24 hours) at 08/21/2019 1447 Last data filed at 08/21/2019 1304 Gross per 24 hour  Intake 1040 ml  Output 2200 ml  Net -1160 ml   Filed Weights   08/18/19 0107 08/18/19 0400 08/20/19 0414  Weight: 72.4 kg 72.4 kg 68.9 kg   Examination: Physical Exam:  Constitutional: Thin Caucasian male in NAD but is anxious Eyes: Lids and  conjunctivae normal, sclerae anicteric  ENMT: External Ears, Nose appear normal. Grossly normal hearing. Mucous membranes are moist. Poor Dentition  Neck: Appears normal, supple, no cervical masses, normal ROM, no appreciable thyromegaly; mild JVD Respiratory: Diminished to auscultation bilaterally with coarse breath sounds and crackles; no appreciable wheezing, rales, rhonchi. Normal respiratory effort and patient is not tachypenic. No accessory muscle use. Unlabored breathing  Cardiovascular: Tachycardic Rate but regular rhythm, no murmurs / rubs / gallops. S1 and S2 auscultated. 1-2+ LE extremity edema. .  Abdomen: Soft, non-tender, Distended. Has appreciable hepatosplenomegaly and can feel the border of the spleen near the umbilicus. Bowel sounds positive x4.  GU: Deferred. Musculoskeletal: No clubbing / cyanosis of digits/nails. No joint deformity upper and lower extremities.  Skin: No rashes, lesions, ulcers on a limited skin evaluation but has some tattoos. No induration; Warm and dry.  Neurologic: CN 2-12 grossly intact with no focal deficits. Romberg sign and cerebellar reflexes not assessed.  Psychiatric: Normal judgment and insight. Alert and oriented x 3. Anxious mood and appropriate affect.   Data Reviewed: I have personally reviewed following labs and imaging studies  CBC: Recent Labs  Lab 08/17/19 1914 08/18/19 0749 08/18/19 1803 08/19/19 0235 08/19/19 1921 08/20/19 0216 08/21/19 0539  WBC 3.4* 3.8*  --  3.8*  --  4.2 4.7  NEUTROABS 0.9* 1.3*  --  1.0*  --  1.2* 1.5*  HGB 4.6* 7.0* 7.5* 7.2* 8.5* 7.8* 7.4*  HCT 14.3* 21.3* 22.5* 21.7* 26.2* 23.9* 22.6*  MCV 85.1 85.2  --  84.8  --  85.7 84.3  PLT 39* 42*  --  47*  --  38* 38*   Basic Metabolic Panel: Recent Labs  Lab 08/17/19 1914 08/18/19 0749 08/19/19 0235 08/20/19 0216 08/21/19 0539  NA 126* 128* 127* 131* 134*  K 4.1 3.9 3.6 3.5 3.8  CL 92* 96* 92* 96* 97*  CO2 19* 21* 22 21* 24  GLUCOSE 78 89 118* 107*  80  BUN '10 11 12 13 14  ' CREATININE 0.52* 0.53* 0.68 0.63 0.61  CALCIUM 8.6* 8.6* 8.3*  8.0* 8.7*  MG  --  1.5* 1.5* 1.7 1.5*  PHOS  --  3.4 3.0 3.0 3.4   GFR: Estimated Creatinine Clearance: 100.5 mL/min (by C-G formula based on SCr of 0.61 mg/dL). Liver Function Tests: Recent Labs  Lab 08/18/19 0749 08/19/19 0235 08/20/19 0216 08/21/19 0539  AST 69*  67* 67* 67* 61*  ALT '24  25 25 25 24  ' ALKPHOS 160*  171* 147* 153* 175*  BILITOT 1.4*  1.4* 1.3* 1.0 0.8  PROT 4.4*  4.6* 4.3* 4.3* 4.4*  ALBUMIN 2.0*  2.0* 1.8* 1.8* 1.8*   No results for input(s): LIPASE, AMYLASE in the last 168 hours. No results for input(s): AMMONIA in the last 168 hours. Coagulation Profile: Recent Labs  Lab 08/18/19 1314  INR 1.3*   Cardiac Enzymes: No results for input(s): CKTOTAL, CKMB, CKMBINDEX, TROPONINI in the last 168 hours. BNP (last 3 results) No results for input(s): PROBNP in the last 8760 hours. HbA1C: No results for input(s): HGBA1C in the last 72 hours. CBG: No results for input(s): GLUCAP in the last 168 hours. Lipid Profile: No results for input(s): CHOL, HDL, LDLCALC, TRIG, CHOLHDL, LDLDIRECT in the last 72 hours. Thyroid Function Tests: No results for input(s): TSH, T4TOTAL, FREET4, T3FREE, THYROIDAB in the last 72 hours. Anemia Panel: No results for input(s): VITAMINB12, FOLATE, FERRITIN, TIBC, IRON, RETICCTPCT in the last 72 hours. Sepsis Labs: Recent Labs  Lab 08/18/19 0134 08/18/19 0437 08/18/19 0749 08/18/19 1032 08/19/19 0235 08/20/19 0216  PROCALCITON  --   --  2.60  --  3.00 2.31  LATICACIDVEN 6.0* 4.6* 3.0* 2.9*  --   --     Recent Results (from the past 240 hour(s))  Culture, Urine     Status: None   Collection Time: 08/17/19  7:14 PM   Specimen: Urine, Clean Catch  Result Value Ref Range Status   Specimen Description   Final    URINE, CLEAN CATCH Performed at [facility], [place] [address] [place], [place] [address]    Special  Requests   Final    NONE Performed at [facility], [place] [address], [place], [place] [address]    Culture   Final    NO GROWTH Performed at [facility], [place] [address], [place], [place] [address]    Report Status 08/19/2019 FINAL  Final  SARS CORONAVIRUS 2 (TAT 6-24 HRS) Nasopharyngeal Nasopharyngeal Swab     Status: None   Collection Time: 08/17/19  8:52 PM   Specimen: Nasopharyngeal Swab  Result Value Ref Range Status   SARS Coronavirus 2 NEGATIVE NEGATIVE Final    Comment: (NOTE) SARS-CoV-2 target nucleic acids are NOT DETECTED. The SARS-CoV-2 RNA is generally detectable in upper and lower respiratory specimens during the acute phase of infection. Negative results do not preclude SARS-CoV-2 infection, do not rule out co-infections with other pathogens, and should not be used as the sole basis for treatment or other patient management decisions. Negative results must be combined with clinical observations, patient history, and epidemiological information. The expected result is Negative. Fact Sheet for Patients: SugarRoll.be Fact Sheet for Healthcare Providers: https://www.woods-mathews.com/ This test is not yet approved or cleared by the [place] FDA and  has been authorized for detection and/or diagnosis of SARS-CoV-2 by FDA under an Emergency Use Authorization (EUA). This EUA will remain  in effect (meaning this test can be used) for the duration of the COVID-19 declaration under Section 56 4(b)(1) of the Act, 21  U.S.C. section 360bbb-3(b)(1), unless the authorization is terminated or revoked sooner. Performed at [facility], [place] [address], [place], [place] [address]   Blood Culture (routine x 2)     Status: Abnormal   Collection Time: 08/17/19  8:55 PM   Specimen: BLOOD  Result Value Ref Range Status   Specimen Description   Final    BLOOD LEFT ANTECUBITAL Performed at [facility] [address] [place], [place] [address]    Special Requests   Final    BOTTLES DRAWN AEROBIC AND ANAEROBIC Blood Culture adequate volume Performed at [facility] [address], [place], [place] [address]    Culture  Setup Time   Final    GRAM POSITIVE COCCI AEROBIC BOTTLE ONLY CRITICAL RESULT CALLED TO, READ BACK BY AND VERIFIED WITH: J. [place], PHARMD ([facility]) AT 4142 ON 08/18/19 BY C. JESSUP, MT.    Culture (A)  Final    STAPHYLOCOCCUS SPECIES (COAGULASE NEGATIVE) THE SIGNIFICANCE OF ISOLATING THIS ORGANISM FROM A SINGLE SET OF BLOOD CULTURES WHEN MULTIPLE SETS ARE DRAWN IS UNCERTAIN. PLEASE NOTIFY THE MICROBIOLOGY DEPARTMENT WITHIN ONE WEEK IF SPECIATION AND SENSITIVITIES ARE REQUIRED. Performed at [facility], [place] [address], [place], [place] [address]    Report Status 08/19/2019 FINAL  Final  Blood Culture (routine x 2)     Status: None (Preliminary result)   Collection Time: 08/17/19  8:55 PM   Specimen: BLOOD  Result Value Ref Range Status   Specimen Description   Final    BLOOD RIGHT ANTECUBITAL Performed at [facility] [address], [place], [place] [address]    Special Requests   Final    BOTTLES DRAWN AEROBIC AND ANAEROBIC Blood Culture results may not be optimal due to an excessive volume of blood received in culture bottles Performed at [facility] [address] [place], [place] [address]    Culture   Final    NO GROWTH 4 DAYS Performed at [facility], [place] [address], [place], [place] [address]    Report Status PENDING  Incomplete  MRSA PCR Screening     Status: None   Collection Time: 08/18/19 12:22 AM   Specimen: Nasal Mucosa; Nasopharyngeal  Result Value Ref Range Status   MRSA by PCR NEGATIVE NEGATIVE Final    Comment:        The GeneXpert MRSA Assay (FDA approved for NASAL specimens only), is one component of a comprehensive MRSA colonization surveillance  program. It is not intended to diagnose MRSA infection nor to guide or monitor treatment for MRSA infections. Performed at [facility], [place] [address] [place], [place] [address]   Culture, blood (routine x 2)     Status: None (Preliminary result)   Collection Time: 08/19/19  5:48 PM   Specimen: BLOOD RIGHT HAND  Result Value Ref Range Status   Specimen Description   Final    BLOOD RIGHT HAND Performed at [facility], [place] [address], [place], [place] [address]    Special Requests   Final    BOTTLES DRAWN AEROBIC ONLY Blood Culture adequate volume Performed at [facility] [address] [place], [place] [address]    Culture   Final    NO GROWTH 2 DAYS Performed at [facility] [address] [place],  [address]    Report Status PENDING  Incomplete  Culture, blood (routine x 2)  Status: None (Preliminary result)   Collection Time: 08/19/19  5:48 PM   Specimen: BLOOD LEFT HAND  Result Value Ref Range Status   Specimen Description   Final    BLOOD LEFT HAND Performed at [facility], [place] [address] [place], [place] [address]    Special Requests   Final    BOTTLES DRAWN AEROBIC ONLY Blood Culture adequate volume Performed at [facility] [address] [place], [place] [address]    Culture   Final    NO GROWTH 2 DAYS Performed at [facility] [address], [place], [place] [address]    Report Status PENDING  Incomplete     Radiology Studies: Dg Abd 1 View  Result Date: 08/21/2019 CLINICAL DATA:  Abdominal swelling and discomfort. EXAM: ABDOMEN - 1 VIEW COMPARISON:  CT, 08/18/2019. FINDINGS: No bowel dilation to suggest obstruction. Bowel is displaced inferiorly by the markedly enlarged spleen. Soft tissues are poorly defined. No evidence of a renal or ureteral stone. There are scattered aortic and iliac artery atherosclerotic calcifications. IMPRESSION: 1. No bowel  obstruction. 2. Enlarged spleen. Poorly defined soft tissues consistent with ascites as noted on the prior CT. Electronically Signed   By: Lajean Manes M.D.   On: 08/21/2019 14:20   US Abdomen Complete  Result Date: 08/21/2019 CLINICAL DATA:  Abdominal pain and distention. History of alcohol and drug abuse. EXAM: ABDOMEN ULTRASOUND COMPLETE COMPARISON:  None. FINDINGS: Gallbladder: Small gallstones and sludge. At least 1 polyp noted measuring 3 mm. There is pericholecystic fluid. No sonographic Murphy's sign. Common bile duct: Diameter: 4 mm Liver: Liver appears mildly enlarged. Increased liver parenchymal echogenicity. No mass or focal lesion. Portal vein is patent on color Doppler imaging with normal direction of blood flow towards the liver. IVC: No abnormality visualized. Pancreas: Hypoechoic lesion either adjacent to or within the pancreatic head measuring 2.3 x 1.4 cm. This could reflect a pancreatic mass or adjacent enlarged gastrohepatic ligament lymph node a less well-defined hypoechoic area is seen in the pancreas below this. No pancreatic duct dilation. Spleen: Enlarged spleen measuring 24 x 9 x 23 cm. No splenic mass or focal lesion. Right Kidney: Length: 12.1 cm. Normal parenchymal echogenicity. There are hypo to anechoic cortical lesions that are consistent with cysts. Mild hydronephrosis. No stones. Left Kidney: Length: 12.5 cm. Echogenicity within normal limits. No mass or hydronephrosis visualized. Abdominal aorta: No aneurysm visualized. Other findings: Bilateral pleural effusions. IMPRESSION: 1. No convincing acute finding. There are small gallstones, gallbladder sludge and 1 small polyp as well as pericholecystic fluid, but no sonographic Murphy's sign. Gallbladder is also only mildly to moderately distended. No convincing acute cholecystitis. 2. Hepatosplenomegaly. Increased echogenicity of the liver consistent with hepatic steatosis. No liver mass. 3. Bilateral pleural effusions. 4. Mild  right hydronephrosis. Hypoechoic to anechoic right renal masses that are likely cysts but not fully characterized. Electronically Signed   By: Lajean Manes M.D.   On: 08/21/2019 14:28   Dg Chest Port 1 View  Result Date: 08/21/2019 CLINICAL DATA:  Shortness of breath. EXAM: PORTABLE CHEST 1 VIEW COMPARISON:  08/20/2019 FINDINGS: 0426 hours. Cardiopericardial silhouette is at upper limits of normal for size. Small bilateral pleural effusions are again noted with bibasilar collapse/consolidation. The visualized bony structures of the thorax are intact. Telemetry leads overlie the chest. IMPRESSION: Stable exam. Small bilateral pleural effusions with bibasilar collapse/consolidation. Electronically Signed   By: Misty Stanley M.D.   On: 08/21/2019 07:21   Dg  Chest Port 1 View  Result Date: 08/20/2019 CLINICAL DATA:  Shortness of breath EXAM: PORTABLE CHEST 1 VIEW COMPARISON:  Two days ago FINDINGS: Normal heart size. Haziness of the bilateral lower chest from layering pleural fluid with atelectasis by recent CT. No air bronchogram. No pneumothorax. IMPRESSION: Pleural effusions and atelectasis that have increased from 2 days ago. Electronically Signed   By: Monte Fantasia M.D.   On: 08/20/2019 07:33   Scheduled Meds: . sodium chloride   Intravenous Once  . Chlorhexidine Gluconate Cloth  6 each Topical Daily  . feeding supplement (ENSURE ENLIVE)  237 mL Oral BID BM  . feeding supplement (PRO-STAT SUGAR FREE 64)  30 mL Oral BID  . folic acid  1 mg Oral Daily  . hydrocortisone   Rectal TID  . ipratropium  0.5 mg Nebulization BID  . levalbuterol  0.63 mg Nebulization BID  . mouth rinse  15 mL Mouth Rinse BID  . multivitamin with minerals  1 tablet Oral Daily  . nicotine  7 mg Transdermal Daily  . nystatin  5 mL Mouth/Throat QID  . pantoprazole  20 mg Oral Daily   Continuous Infusions: . ceFEPime (MAXIPIME) IV 2 g (08/21/19 1442)  . sodium chloride      LOS: 4 days   Kerney Elbe, DO  [facility] PAGER is on [place]  If 7PM-7AM, please contact night-coverage [email or web address]

## 2019-08-21 NOTE — Progress Notes (Signed)
Patient's daughter called the nurse on the phone to check on her brother. [name] would like the doctor to call her @ [phone] with updates.

## 2019-08-21 NOTE — Progress Notes (Signed)
   08/21/19 1510  Vitals  Temp 100.2 F (37.9 C)  Temp Source Oral  BP 123/62  MAP (mmHg) 81  BP Location Right Arm  BP Method Automatic  Patient Position (if appropriate) Sitting  Pulse Rate (!) 126  Pulse Rate Source Monitor  Cardiac Rhythm ST  Resp (!) 28  Level of Consciousness  Level of Consciousness Alert  Oxygen Therapy  SpO2 96 %  O2 Device Room Air  Pain Assessment  Pain Scale 0-10  Pain Score 0  PCA/Epidural/Spinal Assessment  Respiratory Pattern Labored;Accessory muscle use;Dyspnea at rest;Symmetrical;Tachypnea  MEWS Score  MEWS RR 2  MEWS Pulse 2  MEWS Systolic 0  MEWS LOC 0  MEWS Temp 0  MEWS Score 4  MEWS Score Color Red  MEWS Assessment  Is this an acute change? No  MEWS guidelines implemented *See Row Information* Red   Pt's MEWS briefly turned red this shift. This is not an acute change for patient this admission. Pt becomes tachycardic and tachypneic at times, along with febrile. MD Alfredia Ferguson made aware. Pt was very anxious at time of red MEWS- wanted the results of his abdominal US and XR. Pt stated it was difficult to take a deep breath d/t "no room in my stomach." Pt asking for a paracentesis. PRN hydroxyzine was effective. MD Alfredia Ferguson also contacted patient and spoke to him over the phone. This also helped decrease pt's anxiety. Pt now resting comfortably in bed, in no distress. VSS. 1 unit of blood ordered- is not yet ready in blood bank. Will continue to monitor.

## 2019-08-21 NOTE — Progress Notes (Signed)
Patient's vitals: 102.20F;HR126;RR20;125/56 (MAP75). Patient's blood is ready for administration.  PCP was notified

## 2019-08-22 ENCOUNTER — Inpatient Hospital Stay (HOSPITAL_COMMUNITY): Payer: Medicaid Other

## 2019-08-22 LAB — CBC WITH DIFFERENTIAL/PLATELET
Abs Immature Granulocytes: 0.08 10*3/uL — ABNORMAL HIGH (ref 0.00–0.07)
Basophils Absolute: 0 10*3/uL (ref 0.0–0.1)
Basophils Relative: 0 %
Eosinophils Absolute: 0 10*3/uL (ref 0.0–0.5)
Eosinophils Relative: 0 %
HCT: 25 % — ABNORMAL LOW (ref 39.0–52.0)
Hemoglobin: 8.1 g/dL — ABNORMAL LOW (ref 13.0–17.0)
Immature Granulocytes: 2 %
Lymphocytes Relative: 26 %
Lymphs Abs: 1.3 10*3/uL (ref 0.7–4.0)
MCH: 27.9 pg (ref 26.0–34.0)
MCHC: 32.4 g/dL (ref 30.0–36.0)
MCV: 86.2 fL (ref 80.0–100.0)
Monocytes Absolute: 2.2 10*3/uL — ABNORMAL HIGH (ref 0.1–1.0)
Monocytes Relative: 41 %
Neutro Abs: 1.6 10*3/uL — ABNORMAL LOW (ref 1.7–7.7)
Neutrophils Relative %: 31 %
Platelets: 42 10*3/uL — ABNORMAL LOW (ref 150–400)
RBC: 2.9 MIL/uL — ABNORMAL LOW (ref 4.22–5.81)
RDW: 21.9 % — ABNORMAL HIGH (ref 11.5–15.5)
WBC: 5.2 10*3/uL (ref 4.0–10.5)
nRBC: 0 % (ref 0.0–0.2)

## 2019-08-22 LAB — COMPREHENSIVE METABOLIC PANEL
ALT: 24 U/L (ref 0–44)
AST: 53 U/L — ABNORMAL HIGH (ref 15–41)
Albumin: 2 g/dL — ABNORMAL LOW (ref 3.5–5.0)
Alkaline Phosphatase: 175 U/L — ABNORMAL HIGH (ref 38–126)
Anion gap: 12 (ref 5–15)
BUN: 25 mg/dL — ABNORMAL HIGH (ref 6–20)
CO2: 27 mmol/L (ref 22–32)
Calcium: 8.7 mg/dL — ABNORMAL LOW (ref 8.9–10.3)
Chloride: 93 mmol/L — ABNORMAL LOW (ref 98–111)
Creatinine, Ser: 0.71 mg/dL (ref 0.61–1.24)
GFR calc Af Amer: >60 mL/min (ref >60)
GFR calc non Af Amer: >60 mL/min (ref >60)
Glucose, Bld: 89 mg/dL (ref 70–99)
Potassium: 3.8 mmol/L (ref 3.5–5.1)
Sodium: 132 mmol/L — ABNORMAL LOW (ref 135–145)
Total Bilirubin: 1.1 mg/dL (ref 0.3–1.2)
Total Protein: 4.5 g/dL — ABNORMAL LOW (ref 6.5–8.1)

## 2019-08-22 LAB — AMYLASE, PLEURAL OR PERITONEAL FLUID: Amylase, Fluid: 43 U/L

## 2019-08-22 LAB — PROTEIN, PLEURAL OR PERITONEAL FLUID: Total protein, fluid: <3 g/dL

## 2019-08-22 LAB — CULTURE, BLOOD (ROUTINE X 2): Culture: NO GROWTH

## 2019-08-22 LAB — BODY FLUID CELL COUNT WITH DIFFERENTIAL
Lymphs, Fluid: 64 %
Monocyte-Macrophage-Serous Fluid: 14 % — ABNORMAL LOW (ref 50–90)
Neutrophil Count, Fluid: 22 % (ref 0–25)
Total Nucleated Cell Count, Fluid: 3684 cu mm — ABNORMAL HIGH (ref 0–1000)

## 2019-08-22 LAB — LACTATE DEHYDROGENASE, PLEURAL OR PERITONEAL FLUID: LD, Fluid: 489 U/L — ABNORMAL HIGH (ref 3–23)

## 2019-08-22 LAB — PHOSPHORUS: Phosphorus: 3.5 mg/dL (ref 2.5–4.6)

## 2019-08-22 LAB — GLUCOSE, PLEURAL OR PERITONEAL FLUID: Glucose, Fluid: 80 mg/dL

## 2019-08-22 LAB — MAGNESIUM: Magnesium: 1.5 mg/dL — ABNORMAL LOW (ref 1.7–2.4)

## 2019-08-22 LAB — LACTATE DEHYDROGENASE: LDH: 675 U/L — ABNORMAL HIGH (ref 98–192)

## 2019-08-22 MED ORDER — LIDOCAINE HCL 1 % IJ SOLN
INTRAMUSCULAR | Status: AC
  Filled 2019-08-22: qty 20

## 2019-08-22 MED ORDER — MAGNESIUM SULFATE 50 % IJ SOLN
3.0000 g | Freq: Once | INTRAVENOUS | Status: AC
  Administered 2019-08-22: 3 g via INTRAVENOUS
  Filled 2019-08-22: qty 6

## 2019-08-22 NOTE — Progress Notes (Signed)
PROGRESS NOTE    Isaac Pierce  XLK:[id number] DOB: 1961-01-26 DOA: 08/17/2019 PCP: Patient, No Pcp Per   Brief Narrative:  HPI per Dr. Shela Leff on 08/17/2019 Isaac Pierce is a 58 y.o. male with a past medical history of chronic back pain, GERD, anxiety, homelessness presenting with a chief complaint of shortness of breath.  Patient states he returned here from [place] about 5 weeks ago and since then has been doing very poorly.  He has no energy and stays in bed all the time.  He has no appetite.  He is feeling short of breath even at rest and his breathing has been getting progressively worse.  He sometimes has a cough productive of white sputum.  He has lost a significant amount of weight.  His legs have been very swollen and his abdomen is also swollen.  He has noticed lumps underneath his axilla and on both sides of his neck.  He wakes up at night drenched in sweat.  States both his parents died from [place].  Denies personal history of malignancy.  He was previously smoking 1 to 2 packs of cigarettes per day but since he has been sick is now only smoking 1 to 4 cigarettes/day.  ED Course: Tachycardic with heart rate in the 110s to 120s.  Tachypneic with respiratory rate in the 20s.  Blood pressure intermittently soft and patient received 1 L normal saline bolus.  Oxygen saturation 96-100% on room air.  Placed on 2 L supplemental oxygen via nasal cannula for increased work of breathing.  Labs showing pancytopenia (WBC count 3.4, hemoglobin 4.6, platelet count 39,000).  FOBT negative.  Other lab abnormalities seen:  Lactic acid 6.4.  Sodium 126.  Bicarb 19, anion gap 15.  BNP normal.  D-dimer elevated at 5.16.  High-sensitivity troponin mildly elevated at 22.  EKG not suggestive of ACS. Pending labs: Pathologist smear review, blood culture x2, SARS-CoV-2 test Chest x-ray showing mild central vascular congestion with small bilateral effusions.  Atelectasis or mild pneumonia at the left  greater than right lung base. Chest CT angiogram (limited exam) negative for PE.  Showing bulky mediastinal, bilateral hilar, and bilateral axillary lymphadenopathy.  Findings concerning for primary lymphoproliferative process such as lymphoma.  Metastatic disease secondary to unknown primary is also a consideration.  Multiple bilateral noncalcified pulmonary nodules measuring up to 10 mm.  Small bilateral pleural effusions, left greater than right with associated compressive atelectasis.  Hepatosplenomegaly within the visualized upper abdomen with ill-defined area of low density within the peripheral aspect of the spleen measuring 3.7 x 2.8 cm, findings concerning for lymphomatous involvement versus metastatic disease versus possible sequela of splenic injury. 2 units PRBCs ordered. ED provider discussed the case with Dr. Julien Nordmann from oncology, will consult in a.m.  **Interim History During the course of his hospitalization he had severe respiratory distress and was administered IV Lasix with improvement and will be continued on it.  Pulmonary was consulted for further evaluation recommendations and medical oncology recommending biopsy and he underwent a lymph node biopsy of the dominant right inguinal lymph node and a bone marrow biopsy.  He is status post 5 units of PRBCs and blood count is stabilized but still on the lower side with a Hb of 8.1. Scheduled IV Lasix yesterday given his volume overload. Patient continues to spike temperatures and will continue IV cefepime for now.  Do not know if the fevers are related to likely malignancy versus secondary to infection.  Patient remains significantly volume  overloaded and will continue Lasix and obtain abdominal ultrasound as he continues to have some abdominal distention that he feels has worsened.  [place] ultrasound showed splenomegaly but it also revealed that he had a left pleural effusion.  Thoracentesis was ordered and he had 550 MLS drained off his  left lung today.  Pathology results are still not back yet and likely will be back tomorrow.  I spoke with oncology who does not feel the patient is hemolyzing his blood per recommends keeping his hemoglobin above 7.5  Assessment & Plan:   Principal Problem:   Severe sepsis ([place]) Active Problems:   Malignancy ([place])   Pancytopenia ([place])   Dyspnea   Pleural effusion   Hyponatremia   Protein-calorie malnutrition, severe  Lactic Acidosis/ Concern for severe sepsis from unknown source and ? Bacteremia -Lactic acid wassignificantly elevated at 6.8 on admission -Although this could be related to underlying malignancy, there remains concern for severe sepsis given tachycardia, tachypnea, and soft blood pressure readings.  No fever.  Does have lymphopenia.  Chest CTA not suggestive of pneumonia and is negative for PE.  UA not suggestive of infection. -Received 1 L normal saline bolus and was given more fluid and now is KVO'd   -Continue to monitor blood pressure closely and will not give additional fluid boluses to keep MAP >65 given severe Volume Overload.  Given a dose of IV Lasix again today -Give broad-spectrum antibiotics including vancomycin and Zosyn but will change to IV Cefepime; Will continue IV Vancomycin today and de-escalate later today -Checked procalcitonin level and was 2.60 and worsened today to 3.00 and yesterday was 2.31 -Trending Lactic Acid and trended down from 6.8 -> 2.9 -Blood culture x2 pending aerobic set of 1 blood cultures gram-positive cocci (Staph Species) could be contaminant; Repeat Blood Cx 08/19/2019 showed no growth to date at 2 Days  -Urine culture showed no Growth  -SARS-CoV-2 test Negative  -Repeat CXR today howed "Rotated film with retrocardiac left base collapse/consolidation and Effusion. Small nodular density at the right base superimposed on the right fifth rib. Attention on follow-up recommended." -Patient underwent a thoracentesis today and showed  "Interval left thoracentesis, without pneumothorax. Resolution of left lower lobe airspace disease, with mild right infrahilar atelectasis remaining. Suspicion of a 7 mm right midlung nodule, as before." -Changed IV Zosyn to IV cefepime will continue; IV vancomycin is now stopped -Patient continues to spike temperatures and continues to remain tachycardic and a little tachypneic; blood pressure is improved as well as tachypenia; continues to spike temperatures and a temperature of 101.2 yesterday evening  Concern for malignancy/ lymphoproliferative disorder -Patient is presenting with a 5-week history of dyspnea, night sweats, anorexia, significant weight loss, and diffuse lymphadenopathy.  -Chest CT angiogram showing bulky mediastinal, bilateral hilar, and bilateral axillary lymphadenopathy.  Findings concerning for primary lymphoproliferative process such as lymphoma.  Metastatic disease secondary to unknown primary is also consideration.  Also showing multiple bilateral noncalcified pulmonary nodules measuring up to 10 mm.  Hepatosplenomegaly within the visualized upper abdomen with ill-defined area of low density within the peripheral aspect of the spleen measuring 3.7 x 2.8 cm, findings concerning for lymphomatous involvement versus metastatic disease versus possible sequela of splenic injury. -ED provider discussed the case with Dr. Julien Nordmann from oncology -CT abdomen pelvis for further staging and showed "Massive splenomegaly with abdominal and inguinal lymphadenopathy. Findings are suggestive for a lymphoproliferative process such as lymphoma. Evidence for multiple splenic infarcts likely related to the splenomegaly. Cannot exclude a splenic  lesion along the left posterior aspect measuring up to 3.8 cm.  Evidence for fluid overload state with diffuse subcutaneous edema, bilateral pleural effusions and ascites.  Re-demonstration pleural-based nodularity at the lung bases. There is also septal  thickening and a nodular structure in the right lower lobe. Findings are compatible with a neoplastic or lymphoproliferative process.  No discrete liver lesions.  Aortic Atherosclerosis.Concern for at least mild stenosis involving the proximal SMA. Ectasia of the celiac trunk." -Since concerning for lymphoma versus metastatic disease from an unknown primary and patient is to undergo an ultrasound-guided lymph node biopsy by IR and had a technically successful ultrasound-guided biopsy of the dominant right inguinal lymph node -Pulmonary was also consulted and recommending biopsy and they doubt that he needs mediastinoscopy -Underwent a CT-guided aspirate and core biopsy of the right iliac bone for bone marrow biopsy yesterday -Biopsies are still pending -Continues to spike temperatrues and ?If related to Lymphoproliferative disorder; Continuing Abx for now   Pancytopenia (leukopenia, severe anemia, and severe thrombocytopenia) -Suspect related to malignancy or lymphoproliferative disorder. On Admission WBC count 3.4, hemoglobin 4.6, platelet count 39,000 on admission.  Blood loss anemia from acute GI bleed less likely given negative FOBT. -Anemia panel done and showed an iron level of 87, TIBC 126, TIBC 230, saturation ratios of 41%, ferritin of 1444, folate of 5.4, and vitamin B12 level 1022 -Patient's hemoglobin/hematocrit went from 4.6/14.3 and is now 8.1/25.0 -Oncology consulted and recommending PLT transfusions for hemoglobin less than 7.5 and recommended platelet transfusion of platelets drop below 20,000 or active bleeding -Pathologist smear review ordered  -Medical oncology is also started the patient on folic acid 1 mg p.o. daily -Continue to monitor CBC -WBC is 5.2 now and improved and Platelet Count is 42,000 -Anemia panel will not help now that patient has received blood -Transfused  5units of PRBCs; LDH was 675 but Dr. 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   -Repeat CXR as above -BNP normal at 93.7 and 91.8   -Weaned to room air -BP improved and given IV Lasix again today given his continued chest x-ray findings -Will obtain a Thoracentesis and had 550 mL off of left Lung -Continuous pulse oximetry and maintain O2 saturation greater than 94% -Continue supplemental oxygen via nasal cannula wean O2 as tolerated and currently not wearing Supplemental O2  -Continue to Monitor and Repeat CXR in AM   Hyponatremia, improving -Initially thought to be from Hypovolemia but patient had significant JVD and LE Edema -Stopped IVF and started Diuresis and given another dose of IV Lasix this morning and will give another dose of Lasix later this evening and make it BID  -Initially Patient may have appeared dehydrated as he Endorsed decreased p.o. intake for over a month.  -Sodium 126 on admission and repeat this AM was 132 -IV fluid hydration now KVO'd  -Continue to monitor CMP in AM  -Check serum osmolarity  Normal anion gap metabolic acidosis -Bicarb 19, anion gap 15 on admission and repeat showed an anion gap of 12, CO2 27, and a chloride of 93.  Patient is not on a diuretic and not endorsing diarrhea we have started him on Lasix -IV fluid hydration as above and is now [facility] and given another dose of  IV Lasix  -Continue to monitor CMP  Bilateral lower extremity edema/ concern for DVT, improved -Patient has significant bilateral lower extremity edema.   -D-dimer elevated at 5.16.  CT angiogram negative for PE.  There remains concern for DVT given imaging findings consistent with malignancy. -Bilateral lower extremity Dopplers no evidence of the DVT in the right left  lower extremity and there is no cystic structures found in popliteal fossa of both extremities -C/w IV Lasix today again and have scheduled it 40 mg BID   Hypomagnesemia -Patient magnesium level this morning is 1.5 -Replete with IV mag sulfate 3 g today as it continues to be low  -Continue to monitor replete as necessary -Repeat magnesium level in a.m.  Troponinemia High-sensitivity troponin mildly elevated at 22 on admission .   -EKG not suggestive of ACS.  Patient is not complaining of chest pain and appears comfortable. -C/w Cardiac monitoring -Trend troponin and a trended up to 128  Tobacco Use -Smoking cessation counseling given and was started on a nicotine patch  Volume Overload -In the setting possible CHF versus hypoalbuminemia but CHF ruled out and he has very poor nutrition -BNP was normal -We will check cardiac echocardiogram and he was given IV Lasix again today and increased it to 40 mg BID  -Currently -6.739 L since Admission and is -1.525 Liters today  -Will give TED Hose and he had a Thoracentesis  -Continue monitor signs and symptoms of volume overload and strict I's and O's and daily weights  -Will fluid restrict to 1500 mL  Anxiety -Continue home Hydroxyzine  Chronic Back Pain -Tylenol as needed  GERD -Continue PPI with Pantoprazole 20 mg Daily   Homelessness -Social work consulted for further assistance   HIV screening The patient falls between the ages of 13-64 and should be screened for HIV, therefore HIV testing ordered and is Non-Reactive   Hyperbilirubinemia and abnormal LFTs -AST was 67 ALT was 69 and T bili was 1.4; Now AST is 53, ALT is 24, T bili is 1.1 and trending down -Continue to monitor and trend and check acute hepatitis panel and right upper quadrant ultrasound if necessary -Repeat CMP in a.m.  Severe malnutrition in the context of chronic illness -Nutritionist consulted for further evaluation recommendations -Nutritionist  recommended Ensure Enlive p.o. twice daily, prostat 30 mL p.o. twice daily, multivitamin with mineral daily p.o. and encouraging p.o. intake -Appreciate Nutrition evaluation   Abdominal Distention -In the setting of his Splenomegaly from process as above -Checked KUB and showed "No bowel dilation to suggest obstruction. Bowel is displaced inferiorly by the markedly enlarged spleen. Soft tissues are poorly defined. No evidence of a renal or ureteral stone. There are scattered aortic and iliac artery atherosclerotic calcifications."  -Checked Abdominal U/S and showed "No convincing acute finding. There are small gallstones, gallbladder sludge and 1 small polyp as well as pericholecystic fluid, but no sonographic Murphy's sign. Gallbladder is also only mildly to moderately distended. No convincing acute cholecystitis. Hepatosplenomegaly. Increased echogenicity of the liver consistent with hepatic steatosis. No liver mass. Bilateral pleural effusions. Mild right hydronephrosis. Hypoechoic to anechoic right renal masses that are likely cysts but not fully characterized."  DVT prophylaxis: SCDs Code Status: FULL CODE Family Communication: No family present at bedside  Disposition Plan: Transferred to Telemetry now that he has improved   Consultants:   Medical Oncology  Pulmonary  Interventional Radiology   Procedures:  ECHOCARDIOGRAM IMPRESSIONS    1. Left ventricular ejection fraction, by visual estimation, is  60 to 65%. The left ventricle has normal function. There is no left ventricular hypertrophy.  2. Indeterminate diastolic filling due to E-A fusion.  3. The left ventricle has no regional wall motion abnormalities.  4. Global right ventricle has normal systolic function.The right ventricular size is normal. No increase in right ventricular wall thickness.  5. Left atrial size was mildly dilated.  6. Right atrial size was normal.  7. Trivial pericardial effusion is present.  8. The  mitral valve is normal in structure. No evidence of mitral valve regurgitation. No evidence of mitral stenosis.  9. The tricuspid valve is normal in structure. Tricuspid valve regurgitation is trivial. 10. The aortic valve is tricuspid. Aortic valve regurgitation is not visualized. No evidence of aortic valve sclerosis or stenosis. 11. TR signal is inadequate for assessing pulmonary artery systolic pressure. 12. The inferior vena cava is normal in size with greater than 50% respiratory variability, suggesting right atrial pressure of 3 mmHg.  FINDINGS  Left Ventricle: Left ventricular ejection fraction, by visual estimation, is 60 to 65%. The left ventricle has normal function. The left ventricle has no regional wall motion abnormalities. The left ventricular internal cavity size was the left  ventricle is normal in size. There is no left ventricular hypertrophy. Indeterminate diastolic filling due to E-A fusion.  Right Ventricle: The right ventricular size is normal. No increase in right ventricular wall thickness. Global RV systolic function is has normal systolic function.  Left Atrium: Left atrial size was mildly dilated.  Right Atrium: Right atrial size was normal in size  Pericardium: Trivial pericardial effusion is present.  Mitral Valve: The mitral valve is normal in structure. No evidence of mitral valve stenosis by observation. No evidence of mitral valve regurgitation.  Tricuspid Valve: The tricuspid valve is normal in structure. Tricuspid valve regurgitation is trivial.  Aortic Valve: The aortic valve is tricuspid. Aortic valve regurgitation is not visualized. The aortic valve is structurally normal, with no evidence of sclerosis or stenosis.  Pulmonic Valve: The pulmonic valve was normal in structure. Pulmonic valve regurgitation is not visualized.  Aorta: The aortic root is normal in size and structure.  Venous: The inferior vena cava is normal in size with  greater than 50% respiratory variability, suggesting right atrial pressure of 3 mmHg.  IAS/Shunts: No atrial level shunt detected by color flow Doppler.     LEFT VENTRICLE PLAX 2D LVIDd:         5.50 cm Diastology LVIDs:         3.40 cm LV e' lateral: 16.20 cm/s LV PW:         0.90 cm LV e' medial:  10.40 cm/s LV IVS:        0.90 cm LV SV:         100 ml LV SV Index:   52.54    RIGHT VENTRICLE RV S prime:     23.40 cm/s TAPSE (M-mode): 2.7 cm  LEFT ATRIUM             Index LA diam:        3.80 cm 1.98 cm/m LA Vol (A2C):   87.1 ml 45.46 ml/m LA Vol (A4C):   61.0 ml 31.84 ml/m LA Biplane Vol: 72.9 ml 38.05 ml/m  AORTIC VALVE LVOT Vmax:   149.00 cm/s LVOT Vmean:  102.000 cm/s LVOT VTI:    0.266 m   AORTA Ao Root diam: 3.10 cm    SHUNTS Systemic VTI: 0.27 m  THORACENTESIS 08/22/2019 Successful  US guided diagnostic and therapeutic left thoracentesis. Yielded 550 mL of yellow fluid. Pt tolerated procedure well. No immediate complications.  Specimen was sent for labs. CXR ordered.  EBL < 5 mL   Antimicrobials:  Anti-infectives (From admission, onward)   Start     Dose/Rate Route Frequency Ordered Stop   08/18/19 2200  ceFEPIme (MAXIPIME) 2 g in sodium chloride 0.9 % 100 mL IVPB     2 g 200 mL/hr over 30 Minutes Intravenous Every 8 hours 08/18/19 1534     08/18/19 1200  vancomycin (VANCOCIN) 1,250 mg in sodium chloride 0.9 % 250 mL IVPB  Status:  Discontinued     1,250 mg 166.7 mL/hr over 90 Minutes Intravenous Every 12 hours 08/18/19 0621 08/19/19 1733   08/18/19 0600  piperacillin-tazobactam (ZOSYN) IVPB 3.375 g     3.375 g 12.5 mL/hr over 240 Minutes Intravenous Every 8 hours 08/17/19 2251 08/18/19 1800   08/17/19 2300  vancomycin (VANCOCIN) 1,500 mg in sodium chloride 0.9 % 500 mL IVPB     1,500 mg 250 mL/hr over 120 Minutes Intravenous  Once 08/17/19 2250 08/18/19 0332   08/17/19 2300  piperacillin-tazobactam (ZOSYN) IVPB 3.375 g     3.375  g 100 mL/hr over 30 Minutes Intravenous  Once 08/17/19 2250 08/18/19 0131     Subjective: Seen and examined at bedside and he states that his breathing is doing okay but still feels distended and feels that he could breathe a little bit better.  No nausea or vomiting.  Legs are still swollen.  Wanting to get his thoracentesis done.  Slightly frustrated that his biopsy results are still not back yet.  No other concerns complaints at this time and watching television.  Objective: Vitals:   08/22/19 1334 08/22/19 1529 08/22/19 1532 08/22/19 1537  BP: (!) 101/57 (!) 107/53 (!) 117/55 115/60  Pulse: (!) 117     Resp: 18     Temp: 98.3 F (36.8 C)     TempSrc: Oral     SpO2: 96%   94%  Weight:      Height:        Intake/Output Summary (Last 24 hours) at 08/22/2019 1654 Last data filed at 08/22/2019 1619 Gross per 24 hour  Intake 1196 ml  Output 3715 ml  Net -2519 ml   Filed Weights   08/18/19 0107 08/18/19 0400 08/20/19 0414  Weight: 72.4 kg 72.4 kg 68.9 kg   Examination: Physical Exam:  Constitutional: Thin Caucasian male and no acute distress and is slightly anxious Eyes: Lids and conjunctivae normal, sclerae anicteric  ENMT: External Ears, Nose appear normal. Grossly normal hearing. Mucous membranes are moist.  Poor dentition Neck: Appears normal, supple, no cervical masses, normal ROM, no appreciable thyromegaly; slight JVD Respiratory: Diminished to auscultation bilaterally with coarse breath sounds and some crackles worse on the left compared to right, no wheezing, rales, rhonchi. Normal respiratory effort and patient is not tachypenic. No accessory muscle use.  Unlabored breathing and is not wearing any supplemental oxygen via nasal cannula Cardiovascular: Tachycardic rate but regular rhythm, no murmurs / rubs / gallops. S1 and S2 auscultated.  Has 1-2+ lower extremity edema Abdomen: Soft, non-tender, Distended.  Has palpable splenomegaly. Bowel sounds positive.  GU:  Deferred. Musculoskeletal: No clubbing / cyanosis of digits/nails. No joint deformity upper and lower extremities. Skin: No rashes, lesions, ulcers on limited skin evaluation but does have some scattered tattoos. No induration; Warm and dry.  Neurologic: CN 2-12 grossly intact with no focal  deficits.  Romberg sign and cerebellar reflexes not assessed.  Psychiatric: Normal judgment and insight. Alert and oriented x 3. Mildly anxious mood and appropriate affect.   Data Reviewed: I have personally reviewed following labs and imaging studies  CBC: Recent Labs  Lab 08/18/19 0749  08/19/19 0235 08/19/19 1921 08/20/19 0216 08/21/19 0539 08/22/19 0531  WBC 3.8*  --  3.8*  --  4.2 4.7 5.2  NEUTROABS 1.3*  --  1.0*  --  1.2* 1.5* 1.6*  HGB 7.0*   < > 7.2* 8.5* 7.8* 7.4* 8.1*  HCT 21.3*   < > 21.7* 26.2* 23.9* 22.6* 25.0*  MCV 85.2  --  84.8  --  85.7 84.3 86.2  PLT 42*  --  47*  --  38* 38* 42*   < > = values in this interval not displayed.   Basic Metabolic Panel: Recent Labs  Lab 08/18/19 0749 08/19/19 0235 08/20/19 0216 08/21/19 0539 08/22/19 0531  NA 128* 127* 131* 134* 132*  K 3.9 3.6 3.5 3.8 3.8  CL 96* 92* 96* 97* 93*  CO2 21* 22 21* 24 27  GLUCOSE 89 118* 107* 80 89  BUN '11 12 13 14 ' 25*  CREATININE 0.53* 0.68 0.63 0.61 0.71  CALCIUM 8.6* 8.3* 8.0* 8.7* 8.7*  MG 1.5* 1.5* 1.7 1.5* 1.5*  PHOS 3.4 3.0 3.0 3.4 3.5   GFR: Estimated Creatinine Clearance: 100.5 mL/min (by C-G formula based on SCr of 0.71 mg/dL). Liver Function Tests: Recent Labs  Lab 08/18/19 0749 08/19/19 0235 08/20/19 0216 08/21/19 0539 08/22/19 0531  AST 69*   67* 67* 67* 61* 53*  ALT '24   25 25 25 24 24  ' ALKPHOS 160*   171* 147* 153* 175* 175*  BILITOT 1.4*   1.4* 1.3* 1.0 0.8 1.1  PROT 4.4*   4.6* 4.3* 4.3* 4.4* 4.5*  ALBUMIN 2.0*   2.0* 1.8* 1.8* 1.8* 2.0*   No results for input(s): LIPASE, AMYLASE in the last 168 hours. No results for input(s): AMMONIA in the last 168 hours. Coagulation  Profile: Recent Labs  Lab 08/18/19 1314  INR 1.3*   Cardiac Enzymes: No results for input(s): CKTOTAL, CKMB, CKMBINDEX, TROPONINI in the last 168 hours. BNP (last 3 results) No results for input(s): PROBNP in the last 8760 hours. HbA1C: No results for input(s): HGBA1C in the last 72 hours. CBG: No results for input(s): GLUCAP in the last 168 hours. Lipid Profile: No results for input(s): CHOL, HDL, LDLCALC, TRIG, CHOLHDL, LDLDIRECT in the last 72 hours. Thyroid Function Tests: No results for input(s): TSH, T4TOTAL, FREET4, T3FREE, THYROIDAB in the last 72 hours. Anemia Panel: No results for input(s): VITAMINB12, FOLATE, FERRITIN, TIBC, IRON, RETICCTPCT in the last 72 hours. Sepsis Labs: Recent Labs  Lab 08/18/19 0134 08/18/19 0437 08/18/19 0749 08/18/19 1032 08/19/19 0235 08/20/19 0216  PROCALCITON  --   --  2.60  --  3.00 2.31  LATICACIDVEN 6.0* 4.6* 3.0* 2.9*  --   --     Recent Results (from the past 240 hour(s))  Culture, Urine     Status: None   Collection Time: 08/17/19  7:14 PM   Specimen: Urine, Clean Catch  Result Value Ref Range Status   Specimen Description   Final    URINE, CLEAN CATCH Performed at [facility], [place] [address], [place], [place] [address]    Special Requests   Final    NONE Performed at [facility], [address] [place], [place]  [address]    Culture   Final    NO GROWTH Performed at [facility], [place] [address] [place], [place] [address]    Report Status 08/19/2019 FINAL  Final  SARS CORONAVIRUS 2 (TAT 6-24 HRS) Nasopharyngeal Nasopharyngeal Swab     Status: None   Collection Time: 08/17/19  8:52 PM   Specimen: Nasopharyngeal Swab  Result Value Ref Range Status   SARS Coronavirus 2 NEGATIVE NEGATIVE Final    Comment: (NOTE) SARS-CoV-2 target nucleic acids are NOT DETECTED. The SARS-CoV-2 RNA is generally detectable in upper and lower respiratory specimens during the acute phase of  infection. Negative results do not preclude SARS-CoV-2 infection, do not rule out co-infections with other pathogens, and should not be used as the sole basis for treatment or other patient management decisions. Negative results must be combined with clinical observations, patient history, and epidemiological information. The expected result is Negative. Fact Sheet for Patients: SugarRoll.be Fact Sheet for Healthcare Providers: https://www.woods-mathews.com/ This test is not yet approved or cleared by the [place] FDA and  has been authorized for detection and/or diagnosis of SARS-CoV-2 by FDA under an Emergency Use Authorization (EUA). This EUA will remain  in effect (meaning this test can be used) for the duration of the COVID-19 declaration under Section 56 4(b)(1) of the Act, 21 U.S.C. section 360bbb-3(b)(1), unless the authorization is terminated or revoked sooner. Performed at [facility], [place] [address] [place], [place] [address]   Blood Culture (routine x 2)     Status: Abnormal   Collection Time: 08/17/19  8:55 PM   Specimen: BLOOD  Result Value Ref Range Status   Specimen Description   Final    BLOOD LEFT ANTECUBITAL Performed at [place] [address] [place], [place] [address]    Special Requests   Final    BOTTLES DRAWN AEROBIC AND ANAEROBIC Blood Culture adequate volume Performed at [place] [address], [place], [place] [address]    Culture  Setup Time   Final    GRAM POSITIVE COCCI AEROBIC BOTTLE ONLY CRITICAL RESULT CALLED TO, READ BACK BY AND VERIFIED WITH: J. Magnolia, PHARMD ([facility]) AT 5859 ON 08/18/19 BY C. JESSUP, MT.    Culture (A)  Final    STAPHYLOCOCCUS SPECIES (COAGULASE NEGATIVE) THE SIGNIFICANCE OF ISOLATING THIS ORGANISM FROM A SINGLE SET OF BLOOD CULTURES WHEN MULTIPLE SETS ARE DRAWN IS UNCERTAIN. PLEASE NOTIFY THE MICROBIOLOGY DEPARTMENT WITHIN  ONE WEEK IF SPECIATION AND SENSITIVITIES ARE REQUIRED. Performed at [facility], [place] [address], [place], [place] [address]    Report Status 08/19/2019 FINAL  Final  Blood Culture (routine x 2)     Status: None   Collection Time: 08/17/19  8:55 PM   Specimen: BLOOD  Result Value Ref Range Status   Specimen Description   Final    BLOOD RIGHT ANTECUBITAL Performed at [place] [address] [place], [place] [address]    Special Requests   Final    BOTTLES DRAWN AEROBIC AND ANAEROBIC Blood Culture results may not be optimal due to an excessive volume of blood received in culture bottles Performed at [place] [address], [place], [place] [address]    Culture   Final    NO GROWTH 5 DAYS Performed at [facility], [place] [address], [place], [place] [address]    Report Status 08/22/2019 FINAL  Final  MRSA PCR Screening  Status: None   Collection Time: 08/18/19 12:22 AM   Specimen: Nasal Mucosa; Nasopharyngeal  Result Value Ref Range Status   MRSA by PCR NEGATIVE NEGATIVE Final    Comment:        The GeneXpert MRSA Assay (FDA approved for NASAL specimens only), is one component of a comprehensive MRSA colonization surveillance program. It is not intended to diagnose MRSA infection nor to guide or monitor treatment for MRSA infections. Performed at [facility], [place] [address] [place], [place] [address]   Culture, blood (routine x 2)     Status: None (Preliminary result)   Collection Time: 08/19/19  5:48 PM   Specimen: BLOOD RIGHT HAND  Result Value Ref Range Status   Specimen Description   Final    BLOOD RIGHT HAND Performed at [facility], [place] [address], [place], [place] [address]    Special Requests   Final    BOTTLES DRAWN AEROBIC ONLY Blood Culture adequate volume Performed at [place] [address], [place], [place] [address]    Culture   Final    NO  GROWTH 3 DAYS Performed at [facility], [place] [address] [place], [place] [address]    Report Status PENDING  Incomplete  Culture, blood (routine x 2)     Status: None (Preliminary result)   Collection Time: 08/19/19  5:48 PM   Specimen: BLOOD LEFT HAND  Result Value Ref Range Status   Specimen Description   Final    BLOOD LEFT HAND Performed at [facility], [place] [address] [place], [place] [address]    Special Requests   Final    BOTTLES DRAWN AEROBIC ONLY Blood Culture adequate volume Performed at [place] [address], [place], [place] [address]    Culture   Final    NO GROWTH 3 DAYS Performed at [facility], [place] [address], [place], [place] [address]    Report Status PENDING  Incomplete     Radiology Studies: Dg Chest 1 View  Result Date: 08/22/2019 CLINICAL DATA:  Status post thoracentesis. EXAM: CHEST  1 VIEW COMPARISON:  Earlier today at 0400 hours. FINDINGS: 1550 hours. Patient rotated left. Midline trachea. Normal heart size. Interval resolution of left-sided pleural fluid. No right-sided effusion. No pneumothorax. Skin fold projects over the left lower chest. Significantly improved left base airspace disease. Right infrahilar atelectasis remains. Again identified is a right midlung 7 mm nodule IMPRESSION: Interval left thoracentesis, without pneumothorax. Resolution of left lower lobe airspace disease, with mild right infrahilar atelectasis remaining. Suspicion of a 7 mm right midlung nodule, as before. Electronically Signed   By: Abigail Miyamoto M.D.   On: 08/22/2019 16:17   Dg Abd 1 View  Result Date: 08/21/2019 CLINICAL DATA:  Abdominal swelling and discomfort. EXAM: ABDOMEN - 1 VIEW COMPARISON:  CT, 08/18/2019. FINDINGS: No bowel dilation to suggest obstruction. Bowel is displaced inferiorly by the markedly enlarged spleen. Soft tissues are poorly defined. No evidence of a renal or ureteral stone. There are scattered aortic and  iliac artery atherosclerotic calcifications. IMPRESSION: 1. No bowel obstruction. 2. Enlarged spleen. Poorly defined soft tissues consistent with ascites as noted on the prior CT. Electronically Signed   By: Lajean Manes M.D.   On: 08/21/2019 14:20   US Abdomen Complete  Result Date: 08/21/2019 CLINICAL DATA:  Abdominal pain and distention. History of alcohol and drug abuse. EXAM: ABDOMEN ULTRASOUND COMPLETE COMPARISON:  None. FINDINGS:  Gallbladder: Small gallstones and sludge. At least 1 polyp noted measuring 3 mm. There is pericholecystic fluid. No sonographic Murphy's sign. Common bile duct: Diameter: 4 mm Liver: Liver appears mildly enlarged. Increased liver parenchymal echogenicity. No mass or focal lesion. Portal vein is patent on color Doppler imaging with normal direction of blood flow towards the liver. IVC: No abnormality visualized. Pancreas: Hypoechoic lesion either adjacent to or within the pancreatic head measuring 2.3 x 1.4 cm. This could reflect a pancreatic mass or adjacent enlarged gastrohepatic ligament lymph node a less well-defined hypoechoic area is seen in the pancreas below this. No pancreatic duct dilation. Spleen: Enlarged spleen measuring 24 x 9 x 23 cm. No splenic mass or focal lesion. Right Kidney: Length: 12.1 cm. Normal parenchymal echogenicity. There are hypo to anechoic cortical lesions that are consistent with cysts. Mild hydronephrosis. No stones. Left Kidney: Length: 12.5 cm. Echogenicity within normal limits. No mass or hydronephrosis visualized. Abdominal aorta: No aneurysm visualized. Other findings: Bilateral pleural effusions. IMPRESSION: 1. No convincing acute finding. There are small gallstones, gallbladder sludge and 1 small polyp as well as pericholecystic fluid, but no sonographic Murphy's sign. Gallbladder is also only mildly to moderately distended. No convincing acute cholecystitis. 2. Hepatosplenomegaly. Increased echogenicity of the liver consistent with  hepatic steatosis. No liver mass. 3. Bilateral pleural effusions. 4. Mild right hydronephrosis. Hypoechoic to anechoic right renal masses that are likely cysts but not fully characterized. Electronically Signed   By: Lajean Manes M.D.   On: 08/21/2019 14:28   Dg Chest Port 1 View  Result Date: 08/22/2019 CLINICAL DATA:  Shortness of breath. EXAM: PORTABLE CHEST 1 VIEW COMPARISON:  08/21/2019 FINDINGS: 0400 hours. Leftward patient rotation. The cardio pericardial silhouette is enlarged. Interval progression of retrocardiac left base collapse/consolidation with persistent small left effusion. Infrahilar right base atelectasis or infiltrate noted. Nodular density noted at the left base may be a nipple shadow. IMPRESSION: Rotated film with retrocardiac left base collapse/consolidation and effusion. Small nodular density at the right base superimposed on the right fifth rib. Attention on follow-up recommended. Electronically Signed   By: Misty Stanley M.D.   On: 08/22/2019 07:03   Dg Chest Port 1 View  Result Date: 08/21/2019 CLINICAL DATA:  Shortness of breath. EXAM: PORTABLE CHEST 1 VIEW COMPARISON:  08/20/2019 FINDINGS: 0426 hours. Cardiopericardial silhouette is at upper limits of normal for size. Small bilateral pleural effusions are again noted with bibasilar collapse/consolidation. The visualized bony structures of the thorax are intact. Telemetry leads overlie the chest. IMPRESSION: Stable exam. Small bilateral pleural effusions with bibasilar collapse/consolidation. Electronically Signed   By: Misty Stanley M.D.   On: 08/21/2019 07:21   Scheduled Meds:  sodium chloride   Intravenous Once   Chlorhexidine Gluconate Cloth  6 each Topical Daily   feeding supplement (ENSURE ENLIVE)  237 mL Oral BID BM   feeding supplement (PRO-STAT SUGAR FREE 64)  30 mL Oral BID   folic acid  1 mg Oral Daily   furosemide  40 mg Intravenous BID   hydrocortisone   Rectal TID   ipratropium  0.5 mg  Nebulization BID   levalbuterol  0.63 mg Nebulization BID   lidocaine       mouth rinse  15 mL Mouth Rinse BID   multivitamin with minerals  1 tablet Oral Daily   nicotine  7 mg Transdermal Daily   nystatin  5 mL Mouth/Throat QID   pantoprazole  20 mg Oral Daily   Continuous Infusions:  ceFEPime (MAXIPIME)  IV 2 g (08/22/19 0600)   sodium chloride      LOS: 5 days   Kerney Elbe, DO [facility] PAGER is on AMION  If 7PM-7AM, please contact night-coverage [email or web address]

## 2019-08-22 NOTE — Procedures (Signed)
PROCEDURE SUMMARY:  Successful US guided diagnostic and therapeutic left thoracentesis. Yielded 550 mL of yellow fluid. Pt tolerated procedure well. No immediate complications.  Specimen was sent for labs. CXR ordered.  EBL < 5 mL  Docia Barrier PA-C 08/22/2019 3:50 PM

## 2019-08-22 NOTE — Plan of Care (Signed)
  Problem: Activity: Goal: Risk for activity intolerance will decrease Outcome: Progressing   Problem: Nutrition: Goal: Adequate nutrition will be maintained Outcome: Progressing   Problem: Coping: Goal: Level of anxiety will decrease Outcome: Progressing   Problem: Elimination: Goal: Will not experience complications related to bowel motility Outcome: Progressing   

## 2019-08-23 ENCOUNTER — Inpatient Hospital Stay (HOSPITAL_COMMUNITY): Payer: Medicaid Other

## 2019-08-23 LAB — CBC WITH DIFFERENTIAL/PLATELET
Abs Immature Granulocytes: 0 10*3/uL (ref 0.00–0.07)
Basophils Absolute: 0 10*3/uL (ref 0.0–0.1)
Basophils Relative: 0 %
Eosinophils Absolute: 0 10*3/uL (ref 0.0–0.5)
Eosinophils Relative: 0 %
HCT: 22.8 % — ABNORMAL LOW (ref 39.0–52.0)
Hemoglobin: 7.3 g/dL — ABNORMAL LOW (ref 13.0–17.0)
Lymphocytes Relative: 61 %
Lymphs Abs: 3 10*3/uL (ref 0.7–4.0)
MCH: 27.7 pg (ref 26.0–34.0)
MCHC: 32 g/dL (ref 30.0–36.0)
MCV: 86.4 fL (ref 80.0–100.0)
Monocytes Absolute: 0.7 10*3/uL (ref 0.1–1.0)
Monocytes Relative: 14 %
Neutro Abs: 1.2 10*3/uL — ABNORMAL LOW (ref 1.7–7.7)
Neutrophils Relative %: 25 %
Platelets: 46 10*3/uL — ABNORMAL LOW (ref 150–400)
RBC: 2.64 MIL/uL — ABNORMAL LOW (ref 4.22–5.81)
RDW: 21.7 % — ABNORMAL HIGH (ref 11.5–15.5)
WBC: 4.9 10*3/uL (ref 4.0–10.5)
nRBC: 0 % (ref 0.0–0.2)

## 2019-08-23 LAB — PREPARE RBC (CROSSMATCH)

## 2019-08-23 LAB — COMPREHENSIVE METABOLIC PANEL
ALT: 21 U/L (ref 0–44)
AST: 50 U/L — ABNORMAL HIGH (ref 15–41)
Albumin: 1.7 g/dL — ABNORMAL LOW (ref 3.5–5.0)
Alkaline Phosphatase: 174 U/L — ABNORMAL HIGH (ref 38–126)
Anion gap: 13 (ref 5–15)
BUN: 25 mg/dL — ABNORMAL HIGH (ref 6–20)
CO2: 25 mmol/L (ref 22–32)
Calcium: 8.3 mg/dL — ABNORMAL LOW (ref 8.9–10.3)
Chloride: 95 mmol/L — ABNORMAL LOW (ref 98–111)
Creatinine, Ser: 0.72 mg/dL (ref 0.61–1.24)
GFR calc Af Amer: >60 mL/min (ref >60)
GFR calc non Af Amer: >60 mL/min (ref >60)
Glucose, Bld: 127 mg/dL — ABNORMAL HIGH (ref 70–99)
Potassium: 3.1 mmol/L — ABNORMAL LOW (ref 3.5–5.1)
Sodium: 133 mmol/L — ABNORMAL LOW (ref 135–145)
Total Bilirubin: 0.7 mg/dL (ref 0.3–1.2)
Total Protein: 4.3 g/dL — ABNORMAL LOW (ref 6.5–8.1)

## 2019-08-23 LAB — SURGICAL PATHOLOGY

## 2019-08-23 LAB — PHOSPHORUS: Phosphorus: 3 mg/dL (ref 2.5–4.6)

## 2019-08-23 LAB — MAGNESIUM: Magnesium: 1.7 mg/dL (ref 1.7–2.4)

## 2019-08-23 MED ORDER — MAGNESIUM SULFATE 2 GM/50ML IV SOLN
2.0000 g | Freq: Once | INTRAVENOUS | Status: AC
  Administered 2019-08-23: 2 g via INTRAVENOUS
  Filled 2019-08-23: qty 50

## 2019-08-23 MED ORDER — SODIUM CHLORIDE 0.9% IV SOLUTION
Freq: Once | INTRAVENOUS | Status: AC
  Administered 2019-08-23: 10 mL/h via INTRAVENOUS

## 2019-08-23 MED ORDER — POTASSIUM CHLORIDE CRYS ER 20 MEQ PO TBCR
40.0000 meq | EXTENDED_RELEASE_TABLET | Freq: Two times a day (BID) | ORAL | Status: DC
  Administered 2019-08-23 – 2019-08-25 (×5): 40 meq via ORAL
  Filled 2019-08-23 (×5): qty 2

## 2019-08-23 MED ORDER — IBUPROFEN 200 MG PO TABS
600.0000 mg | ORAL_TABLET | Freq: Once | ORAL | Status: AC
  Administered 2019-08-23: 600 mg via ORAL
  Filled 2019-08-23: qty 3

## 2019-08-23 NOTE — Progress Notes (Signed)
Temperature remains elevated at 102.0. Dr. Alfredia Ferguson notified, orders received. Stacey Drain

## 2019-08-23 NOTE — Progress Notes (Addendum)
HEMATOLOGY-ONCOLOGY PROGRESS NOTE  SUBJECTIVE: Reports ongoing distention in his abdomen.  Not complaining of any pain this morning.  Denies nausea and vomiting.  Denies chest pain and shortness of breath.  No recent fevers.  No bleeding.  Status post left thoracentesis with 550 cc of pleural fluid removed.  REVIEW OF SYSTEMS:   Constitutional: Denies fevers, chills Respiratory: Denies cough, dyspnea or wheezes Cardiovascular: Denies palpitation, chest discomfort Gastrointestinal: Reports abdominal distention.  Denies nausea, heartburn or change in bowel habits Skin: Denies abnormal skin rashes Lymphatics: Reports ongoing lymphadenopathy in his neck and groin Neurological:Denies numbness, tingling or new weaknesses Behavioral/Psych: Mood is stable, no new changes  Extremities: Reports improved lower extremity edema All other systems were reviewed with the patient and are negative.  I have reviewed the past medical history, past surgical history, social history and family history with the patient and they are unchanged from previous note.   PHYSICAL EXAMINATION: ECOG PERFORMANCE STATUS: 1 - Symptomatic but completely ambulatory  Vitals:   08/22/19 2128 08/23/19 0458  BP: 120/67 126/70  Pulse: (!) 118 (!) 117  Resp: (!) 21 20  Temp: (!) 96.5 F (35.8 C) 98.1 F (36.7 C)  SpO2: 95% 96%   Filed Weights   08/18/19 0107 08/18/19 0400 08/20/19 0414  Weight: 159 lb 9.8 oz (72.4 kg) 159 lb 9.8 oz (72.4 kg) 151 lb 14.4 oz (68.9 kg)    Intake/Output from previous day: 11/22 0701 - 11/23 0700 In: 1288.1 [P.O.:720; IV Piggyback:568.1] Out: 2270 [Urine:2270]  GENERAL:alert, no distress and comfortable SKIN: skin color, texture, turgor are normal, no rashes or significant lesions EYES: normal, Conjunctiva are pink and non-injected, sclera clear OROPHARYNX:no exudate, no erythema and lips, buccal mucosa, and tongue normal  NECK: supple, thyroid normal size, non-tender, without  nodularity LYMPH: Palpable cervical, axillary, and inguinal lymphadenopathy LUNGS: clear to auscultation and percussion with normal breathing effort HEART: regular rate & rhythm and no murmurs and 1+ bilateral lower extremity pitting edema ABDOMEN: Positive bowel sounds, distended, splenomegaly noted Musculoskeletal:no cyanosis of digits and no clubbing  NEURO: alert & oriented x 3 with fluent speech, no focal motor/sensory deficits  LABORATORY DATA:  I have reviewed the data as listed CMP Latest Ref Rng & Units 08/23/2019 08/22/2019 08/21/2019  Glucose 70 - 99 mg/dL 127(H) 89 80  BUN 6 - 20 mg/dL 25(H) 25(H) 14  Creatinine 0.61 - 1.24 mg/dL 0.72 0.71 0.61  Sodium 135 - 145 mmol/L 133(L) 132(L) 134(L)  Potassium 3.5 - 5.1 mmol/L 3.1(L) 3.8 3.8  Chloride 98 - 111 mmol/L 95(L) 93(L) 97(L)  CO2 22 - 32 mmol/L '25 27 24  '$ Calcium 8.9 - 10.3 mg/dL 8.3(L) 8.7(L) 8.7(L)  Total Protein 6.5 - 8.1 g/dL 4.3(L) 4.5(L) 4.4(L)  Total Bilirubin 0.3 - 1.2 mg/dL 0.7 1.1 0.8  Alkaline Phos 38 - 126 U/L 174(H) 175(H) 175(H)  AST 15 - 41 U/L 50(H) 53(H) 61(H)  ALT 0 - 44 U/L '21 24 24    '$ Lab Results  Component Value Date   WBC 4.9 08/23/2019   HGB 7.3 (L) 08/23/2019   HCT 22.8 (L) 08/23/2019   MCV 86.4 08/23/2019   PLT 46 (L) 08/23/2019   NEUTROABS 1.2 (L) 08/23/2019    Dg Chest 1 View  Result Date: 08/22/2019 CLINICAL DATA:  Status post thoracentesis. EXAM: CHEST  1 VIEW COMPARISON:  Earlier today at 0400 hours. FINDINGS: 1550 hours. Patient rotated left. Midline trachea. Normal heart size. Interval resolution of left-sided pleural fluid. No right-sided effusion. No  pneumothorax. Skin fold projects over the left lower chest. Significantly improved left base airspace disease. Right infrahilar atelectasis remains. Again identified is a right midlung 7 mm nodule IMPRESSION: Interval left thoracentesis, without pneumothorax. Resolution of left lower lobe airspace disease, with mild right infrahilar  atelectasis remaining. Suspicion of a 7 mm right midlung nodule, as before. Electronically Signed   By: Abigail Miyamoto M.D.   On: 08/22/2019 16:17   Dg Chest 2 View  Result Date: 08/17/2019 CLINICAL DATA:  Shortness of breath EXAM: CHEST - 2 VIEW COMPARISON:  None. FINDINGS: Small bilateral pleural effusions. Mild basilar airspace disease. Borderline heart size with mild central vascular congestion. No pneumothorax. IMPRESSION: Mild central vascular congestion with small bilateral effusions. Atelectasis or mild pneumonia at the left greater than right lung base. Electronically Signed   By: Donavan Foil M.D.   On: 08/17/2019 19:57   Dg Abd 1 View  Result Date: 08/21/2019 CLINICAL DATA:  Abdominal swelling and discomfort. EXAM: ABDOMEN - 1 VIEW COMPARISON:  CT, 08/18/2019. FINDINGS: No bowel dilation to suggest obstruction. Bowel is displaced inferiorly by the markedly enlarged spleen. Soft tissues are poorly defined. No evidence of a renal or ureteral stone. There are scattered aortic and iliac artery atherosclerotic calcifications. IMPRESSION: 1. No bowel obstruction. 2. Enlarged spleen. Poorly defined soft tissues consistent with ascites as noted on the prior CT. Electronically Signed   By: Lajean Manes M.D.   On: 08/21/2019 14:20   Ct Angio Chest Pe W/cm &/or Wo Cm  Result Date: 08/17/2019 CLINICAL DATA:  Shortness of breath for 1 month EXAM: CT ANGIOGRAPHY CHEST WITH CONTRAST TECHNIQUE: Multidetector CT imaging of the chest was performed using the standard protocol during bolus administration of intravenous contrast. Multiplanar CT image reconstructions and MIPs were obtained to evaluate the vascular anatomy. CONTRAST:  155m OMNIPAQUE IOHEXOL 350 MG/ML SOLN COMPARISON:  Chest x-ray 08/17/2019 FINDINGS: Cardiovascular: Suboptimal contrast bolus timing and respiratory motion artifact degraded examination of the more distal pulmonary arterial tree. No filling defect within the main or lobar branch  pulmonary arteries. No evidence of right heart strain. Heart size is normal. There is pericardial thickening. Thoracic aorta is normal in course and caliber. Mediastinum/Nodes: Bulky mediastinal, bilateral hilar, and bilateral axillary lymphadenopathy. Thyroid, trachea, and esophagus are grossly unremarkable. Lungs/Pleura: Small bilateral pleural effusions, left greater than right with associated compressive atelectasis. There are numerous bilateral noncalcified pulmonary nodules. Reference nodules include 10 mm right lower lobe juxtapleural nodule (series 6, image 88). 9 mm medial right lower lobe nodule (series 6, image 106). 7 mm anterior right middle lobe nodule (series 6, image 107). 7 mm left lower lobe nodule (series 6, image 90). 8 mm lingular nodule (series 6, image 92). No pneumothorax. Upper Abdomen: Hepatosplenomegaly within the visualized upper abdomen. Ill-defined area of low density within the peripheral aspect of the spleen measuring approximately 3.7 x 2.8 cm (series 4, image 135). Musculoskeletal: No chest wall abnormality. No acute or significant osseous findings. Review of the MIP images confirms the above findings. IMPRESSION: 1. Limited exam. No filling defect to the lobar branch level to suggest pulmonary embolism. 2. Bulky mediastinal, bilateral hilar, and bilateral axillary lymphadenopathy. Findings concerning for primary lymphoproliferative process such as lymphoma. Metastatic disease secondary to unknown primary is also a consideration. 3. Multiple bilateral noncalcified pulmonary nodules measuring up to 10 mm, likely a result of the same process as listed above. 4. Small bilateral pleural effusions, left greater than right with associated compressive atelectasis. 5. Hepatosplenomegaly within the  visualized upper abdomen with ill-defined area of low density within the peripheral aspect of the spleen measuring 3.7 x 2.8 cm. Findings are concerning for lymphomatous involvement versus  metastatic disease. Alternatively, sequela of splenic injury could have a similar appearance. These results were called by telephone at the time of interpretation on 08/17/2019 at 8:49 pm to provider MARGAUX VENTER , who verbally acknowledged these results. Electronically Signed   By: Davina Poke M.D.   On: 08/17/2019 20:51   US Abdomen Complete  Result Date: 08/21/2019 CLINICAL DATA:  Abdominal pain and distention. History of alcohol and drug abuse. EXAM: ABDOMEN ULTRASOUND COMPLETE COMPARISON:  None. FINDINGS: Gallbladder: Small gallstones and sludge. At least 1 polyp noted measuring 3 mm. There is pericholecystic fluid. No sonographic Murphy's sign. Common bile duct: Diameter: 4 mm Liver: Liver appears mildly enlarged. Increased liver parenchymal echogenicity. No mass or focal lesion. Portal vein is patent on color Doppler imaging with normal direction of blood flow towards the liver. IVC: No abnormality visualized. Pancreas: Hypoechoic lesion either adjacent to or within the pancreatic head measuring 2.3 x 1.4 cm. This could reflect a pancreatic mass or adjacent enlarged gastrohepatic ligament lymph node a less well-defined hypoechoic area is seen in the pancreas below this. No pancreatic duct dilation. Spleen: Enlarged spleen measuring 24 x 9 x 23 cm. No splenic mass or focal lesion. Right Kidney: Length: 12.1 cm. Normal parenchymal echogenicity. There are hypo to anechoic cortical lesions that are consistent with cysts. Mild hydronephrosis. No stones. Left Kidney: Length: 12.5 cm. Echogenicity within normal limits. No mass or hydronephrosis visualized. Abdominal aorta: No aneurysm visualized. Other findings: Bilateral pleural effusions. IMPRESSION: 1. No convincing acute finding. There are small gallstones, gallbladder sludge and 1 small polyp as well as pericholecystic fluid, but no sonographic Murphy's sign. Gallbladder is also only mildly to moderately distended. No convincing acute cholecystitis.  2. Hepatosplenomegaly. Increased echogenicity of the liver consistent with hepatic steatosis. No liver mass. 3. Bilateral pleural effusions. 4. Mild right hydronephrosis. Hypoechoic to anechoic right renal masses that are likely cysts but not fully characterized. Electronically Signed   By: Lajean Manes M.D.   On: 08/21/2019 14:28   Ct Abdomen Pelvis W Contrast  Result Date: 08/18/2019 CLINICAL DATA:  58 year old with pancytopenia and chest lymphadenopathy. Concern for lymphoproliferative disorder. EXAM: CT ABDOMEN AND PELVIS WITH CONTRAST TECHNIQUE: Multidetector CT imaging of the abdomen and pelvis was performed using the standard protocol following bolus administration of intravenous contrast. CONTRAST:  116m OMNIPAQUE IOHEXOL 300 MG/ML  SOLN COMPARISON:  Chest CT 08/17/2019 FINDINGS: Lower chest: Small bilateral pleural effusions. Partial visualization of the right infrahilar lymphadenopathy. There may be markedly enlarged lymph nodes adjacent to the distal esophagus. Small amount of pericardial fluid. Again noted is interstitial thickening in the right lower lobe with posterior consolidation in the right lower lobe. Again noted is a elongated nodular structure in the right lower lobe measuring 9 mm on sequence 6, image 20. Pleural-based nodules in the right lower lobe and right middle lobe are similar to the recent comparison examination. Again noted is a pleural-based nodule in the lingula. Focal pleural thickening along the base of the left major fissure is asimilar to the previous examination. Hepatobiliary: Normal appearance of the liver. No discrete liver lesions identified. Main portal venous system is patent. Gallbladder is decompressed. No significant biliary dilatation. Pancreas: Unremarkable. No pancreatic ductal dilatation or surrounding inflammatory changes. Spleen: Spleen is massive for size measuring 27.0 x 10.6 x 17.4 cm, splenic volume is  2489 mL. Scattered areas of non enhancement  throughout the periphery of the spleen probably represent infarcts associated with the large size of the spleen. Indeterminate lesion along the left posterior aspect of spleen on sequence 2, image 44 that measures up to 3.8 cm. Small amount of fluid posterior to the spleen near the left hemidiaphragm. Adrenals/Urinary Tract: Adrenal glands are poorly characterized on this examination but no gross abnormality. Compression on the left kidney due to the splenomegaly. Negative for hydronephrosis. Question fullness of the right renal pelvis but this area is poorly characterized. Mild distention of the urinary bladder. High-density material in the urinary bladder could be related to recent chest CT. Stomach/Bowel: Cannot exclude a hiatal hernia. Stomach is compressed by lymph nodes and the splenomegaly. No evidence for acute bowel inflammation or bowel obstruction. Vascular/Lymphatic: Diffuse atherosclerotic calcifications in the abdominal aorta without aneurysm. Ectasias involving the celiac trunk, measuring roughly 1.1 cm. There is probably stenosis involving the proximal SMA but poorly characterized on this examination. Venous structures are unremarkable. Evidence for enlarged lymph nodes in the gastrohepatic ligament region. Enlarged lymph nodes in the periaortic region. Enlarged lymph nodes along the iliac nodal chains. Markedly enlarged lymph nodes in the right groin. There is a index lymph node in the right groin that measures 2.8 x 2.1 cm on sequence 2, image 78. Multiple small lymph nodes in both inguinal regions. Reproductive: Prostate is unremarkable. Other: Small amount of free fluid in the pelvis. There appears to be free fluid in the left lower quadrant below the spleen. Negative for free air. Diffuse subcutaneous edema. Dependent fluid in the paraspinal tissues in the lumbar spine. Musculoskeletal: Disc space narrowing at L5-S1. No suspicious bone lesions. IMPRESSION: 1. Massive splenomegaly with abdominal  and inguinal lymphadenopathy. Findings are suggestive for a lymphoproliferative process such as lymphoma. 2. Evidence for multiple splenic infarcts likely related to the splenomegaly. Cannot exclude a splenic lesion along the left posterior aspect measuring up to 3.8 cm. 3. Evidence for fluid overload state with diffuse subcutaneous edema, bilateral pleural effusions and ascites. 4. Re-demonstration pleural-based nodularity at the lung bases. There is also septal thickening and a nodular structure in the right lower lobe. Findings are compatible with a neoplastic or lymphoproliferative process. 5. No discrete liver lesions. 6. Aortic Atherosclerosis (ICD10-I70.0). Concern for at least mild stenosis involving the proximal SMA. Ectasia of the celiac trunk. Electronically Signed   By: Markus Daft M.D.   On: 08/18/2019 12:59   Ct Biopsy  Result Date: 08/19/2019 INDICATION: 58 year old male with pancytopenia. He presents for bone marrow biopsy as an inpatient. EXAM: CT GUIDED BONE MARROW ASPIRATION AND CORE BIOPSY Interventional Radiologist:  Criselda Peaches, MD MEDICATIONS: None. ANESTHESIA/SEDATION: 2 mg Versed were administered for anxiolysis. This does not constitute moderate sedation. FLUOROSCOPY TIME:  None. COMPLICATIONS: None immediate. Estimated blood loss: <25 mL PROCEDURE: Informed written consent was obtained from the patient after a thorough discussion of the procedural risks, benefits and alternatives. All questions were addressed. Maximal Sterile Barrier Technique was utilized including caps, mask, sterile gowns, sterile gloves, sterile drape, hand hygiene and skin antiseptic. A timeout was performed prior to the initiation of the procedure. The patient was positioned prone and non-contrast localization CT was performed of the pelvis to demonstrate the iliac marrow spaces. Maximal barrier sterile technique utilized including caps, mask, sterile gowns, sterile gloves, large sterile drape, hand  hygiene, and betadine prep. Under sterile conditions and local anesthesia, an 11 gauge coaxial bone biopsy needle was advanced into  the right iliac marrow space. Needle position was confirmed with CT imaging. Initially, bone marrow aspiration was performed. Next, the 11 gauge outer cannula was utilized to obtain a right iliac bone marrow core biopsy. Needle was removed. Hemostasis was obtained with compression. The patient tolerated the procedure well. Samples were prepared with the cytotechnologist. IMPRESSION: Technically successful CT-guided bone marrow aspiration and biopsy of the right iliac bone. Electronically Signed   By: Jacqulynn Cadet M.D.   On: 08/19/2019 10:38   Dg Chest Port 1 View  Result Date: 08/23/2019 CLINICAL DATA:  Shortness of breath EXAM: PORTABLE CHEST 1 VIEW COMPARISON:  Radiograph 08/22/2019 FINDINGS: Mild left anterior obliquity. Redemonstration of a 9 mm nodule in the right mid lung, seen on comparison CT. Stable bandlike areas of atelectasis and/or scarring. Additional hazy basilar atelectatic changes are present. No significant reaccumulation of the left effusion. No right effusion. Cardiomediastinal contours are stable. No acute osseous or soft tissue abnormality. IMPRESSION: 1. Unchanged 9 mm nodule in the right mid lung, seen on comparison CT. 2. No significant reaccumulation of the left effusion post thoracentesis. 3. Stable bandlike areas of atelectasis and/or scarring. Electronically Signed   By: Lovena Le M.D.   On: 08/23/2019 06:00   Dg Chest Port 1 View  Result Date: 08/22/2019 CLINICAL DATA:  Shortness of breath. EXAM: PORTABLE CHEST 1 VIEW COMPARISON:  08/21/2019 FINDINGS: 0400 hours. Leftward patient rotation. The cardio pericardial silhouette is enlarged. Interval progression of retrocardiac left base collapse/consolidation with persistent small left effusion. Infrahilar right base atelectasis or infiltrate noted. Nodular density noted at the left base may be  a nipple shadow. IMPRESSION: Rotated film with retrocardiac left base collapse/consolidation and effusion. Small nodular density at the right base superimposed on the right fifth rib. Attention on follow-up recommended. Electronically Signed   By: Misty Stanley M.D.   On: 08/22/2019 07:03   Dg Chest Port 1 View  Result Date: 08/21/2019 CLINICAL DATA:  Shortness of breath. EXAM: PORTABLE CHEST 1 VIEW COMPARISON:  08/20/2019 FINDINGS: 0426 hours. Cardiopericardial silhouette is at upper limits of normal for size. Small bilateral pleural effusions are again noted with bibasilar collapse/consolidation. The visualized bony structures of the thorax are intact. Telemetry leads overlie the chest. IMPRESSION: Stable exam. Small bilateral pleural effusions with bibasilar collapse/consolidation. Electronically Signed   By: Misty Stanley M.D.   On: 08/21/2019 07:21   Dg Chest Port 1 View  Result Date: 08/20/2019 CLINICAL DATA:  Shortness of breath EXAM: PORTABLE CHEST 1 VIEW COMPARISON:  Two days ago FINDINGS: Normal heart size. Haziness of the bilateral lower chest from layering pleural fluid with atelectasis by recent CT. No air bronchogram. No pneumothorax. IMPRESSION: Pleural effusions and atelectasis that have increased from 2 days ago. Electronically Signed   By: Monte Fantasia M.D.   On: 08/20/2019 07:33   Dg Chest Port 1 View  Result Date: 08/18/2019 CLINICAL DATA:  Shortness of breath EXAM: PORTABLE CHEST 1 VIEW COMPARISON:  08/18/2019 FINDINGS: Heart is normal size. Bilateral perihilar and lower lobe opacities. No visible effusions. No acute bony abnormality. IMPRESSION: Perihilar and lower lobe opacities could reflect atelectasis, edema or infection. Electronically Signed   By: Rolm Baptise M.D.   On: 08/18/2019 21:19   Dg Chest Port 1 View  Result Date: 08/18/2019 CLINICAL DATA:  Dyspnea. EXAM: PORTABLE CHEST 1 VIEW COMPARISON:  August 17, 2019. FINDINGS: Stable cardiomediastinal silhouette.  No pneumothorax is noted. Mild bibasilar atelectasis or edema is noted with probable small pleural effusions. Bony  thorax is unremarkable. IMPRESSION: Mild bibasilar atelectasis or edema is noted with probable small pleural effusions. Electronically Signed   By: Marijo Conception M.D.   On: 08/18/2019 08:27   Ct Bone Marrow Biopsy & Aspiration  Result Date: 08/19/2019 INDICATION: 58 year old male with pancytopenia. He presents for bone marrow biopsy as an inpatient. EXAM: CT GUIDED BONE MARROW ASPIRATION AND CORE BIOPSY Interventional Radiologist:  Criselda Peaches, MD MEDICATIONS: None. ANESTHESIA/SEDATION: 2 mg Versed were administered for anxiolysis. This does not constitute moderate sedation. FLUOROSCOPY TIME:  None. COMPLICATIONS: None immediate. Estimated blood loss: <25 mL PROCEDURE: Informed written consent was obtained from the patient after a thorough discussion of the procedural risks, benefits and alternatives. All questions were addressed. Maximal Sterile Barrier Technique was utilized including caps, mask, sterile gowns, sterile gloves, sterile drape, hand hygiene and skin antiseptic. A timeout was performed prior to the initiation of the procedure. The patient was positioned prone and non-contrast localization CT was performed of the pelvis to demonstrate the iliac marrow spaces. Maximal barrier sterile technique utilized including caps, mask, sterile gowns, sterile gloves, large sterile drape, hand hygiene, and betadine prep. Under sterile conditions and local anesthesia, an 11 gauge coaxial bone biopsy needle was advanced into the right iliac marrow space. Needle position was confirmed with CT imaging. Initially, bone marrow aspiration was performed. Next, the 11 gauge outer cannula was utilized to obtain a right iliac bone marrow core biopsy. Needle was removed. Hemostasis was obtained with compression. The patient tolerated the procedure well. Samples were prepared with the cytotechnologist.  IMPRESSION: Technically successful CT-guided bone marrow aspiration and biopsy of the right iliac bone. Electronically Signed   By: Jacqulynn Cadet M.D.   On: 08/19/2019 10:38   [place] Core Biopsy (lymph Nodes)  Result Date: 08/18/2019 INDICATION: No known primary, now with extensive lymphadenopathy and splenomegaly worrisome for lymphoma. Please perform ultrasound-guided right inguinal lymph node biopsy for tissue diagnostic purposes. EXAM: ULTRASOUND-GUIDED RIGHT INGUINAL LYMPH NODE BIOPSY COMPARISON:  Chest CT-08/17/2019; CT abdomen and pelvis-earlier same day MEDICATIONS: None ANESTHESIA/SEDATION: None COMPLICATIONS: None immediate. TECHNIQUE: Informed written consent was obtained from the patient after a discussion of the risks, benefits and alternatives to treatment. Questions regarding the procedure were encouraged and answered. Initial ultrasound scanning demonstrated 2 adjacent pathologically enlarged right inguinal lymph nodes compatible with the findings seen on preceding abdominal CT. The dominant approximately 2.7 x 1.6 cm right inguinal lymph node was targeted for biopsy given location and sonographic window (image 7). An ultrasound image was saved for documentation purposes. The procedure was planned. A timeout was performed prior to the initiation of the procedure. The operative was prepped and draped in the usual sterile fashion, and a sterile drape was applied covering the operative field. A timeout was performed prior to the initiation of the procedure. Local anesthesia was provided with 1% lidocaine with epinephrine. Under direct ultrasound guidance, an 18 gauge core needle device was utilized to obtain to obtain 6 core needle biopsies of the dominant right inguinal lymph node. The samples were placed in saline and submitted to pathology. The needle was removed and hemostasis was achieved with manual compression. Post procedure scan was negative for significant hematoma. A dressing was placed.  The patient tolerated the procedure well without immediate postprocedural complication. IMPRESSION: Technically successful ultrasound guided core needle biopsy of dominant right inguinal lymph node. Electronically Signed   By: Sandi Mariscal M.D.   On: 08/18/2019 13:39   Vas [place] Lower Extremity Venous (dvt)  Result  Date: 08/18/2019  Lower Venous Study Indications: Elevated d dimer.  Comparison Study: no prior Performing Technologist: Abram Sander RVS  Examination Guidelines: A complete evaluation includes B-mode imaging, spectral Doppler, color Doppler, and power Doppler as needed of all accessible portions of each vessel. Bilateral testing is considered an integral part of a complete examination. Limited examinations for reoccurring indications may be performed as noted.  +---------+---------------+---------+-----------+----------+--------------+ RIGHT    CompressibilityPhasicitySpontaneityPropertiesThrombus Aging +---------+---------------+---------+-----------+----------+--------------+ CFV      Full           Yes      Yes                                 +---------+---------------+---------+-----------+----------+--------------+ SFJ      Full                                                        +---------+---------------+---------+-----------+----------+--------------+ FV Prox  Full                                                        +---------+---------------+---------+-----------+----------+--------------+ FV Mid   Full                                                        +---------+---------------+---------+-----------+----------+--------------+ FV DistalFull                                                        +---------+---------------+---------+-----------+----------+--------------+ PFV      Full                                                        +---------+---------------+---------+-----------+----------+--------------+ POP      Full            Yes      Yes                                 +---------+---------------+---------+-----------+----------+--------------+ PTV      Full                                                        +---------+---------------+---------+-----------+----------+--------------+ PERO     Full                                                        +---------+---------------+---------+-----------+----------+--------------+   +---------+---------------+---------+-----------+----------+--------------+  LEFT     CompressibilityPhasicitySpontaneityPropertiesThrombus Aging +---------+---------------+---------+-----------+----------+--------------+ CFV      Full           Yes      Yes                                 +---------+---------------+---------+-----------+----------+--------------+ SFJ      Full                                                        +---------+---------------+---------+-----------+----------+--------------+ FV Prox  Full                                                        +---------+---------------+---------+-----------+----------+--------------+ FV Mid   Full                                                        +---------+---------------+---------+-----------+----------+--------------+ FV DistalFull                                                        +---------+---------------+---------+-----------+----------+--------------+ PFV      Full                                                        +---------+---------------+---------+-----------+----------+--------------+ POP      Full           Yes      Yes                                 +---------+---------------+---------+-----------+----------+--------------+ PTV      Full                                                        +---------+---------------+---------+-----------+----------+--------------+ PERO     Full                                                         +---------+---------------+---------+-----------+----------+--------------+     Summary: Right: There is no evidence of deep vein thrombosis in the lower extremity. No cystic structure found in the popliteal fossa. Left: There is no evidence of deep vein thrombosis in the lower extremity. No cystic structure found in the popliteal fossa.  *  See table(s) above for measurements and observations. Electronically signed by Harold Barban MD on 08/18/2019 at 1:53:01 PM.    Final     ASSESSMENT AND PLAN: 1.  Bulky lymphadenopathy highly suspicious for lymphoproliferative disorder versus metastatic disease 2.  Pancytopenia 3.  Lactic acidosis/sepsis, resolved 4.  Dyspnea secondary to left greater than right pleural effusions, improved 5.  Hyponatremia, improved 6.  Anxiety 7.  Chronic back pain 8.  Tobacco dependence  -The patient has bulky lymphadenopathy with fatigue, anorexia, weight loss, and night sweats.  He has an elevated LDH. This is concerning for a lymphoproliferative disorder such as lymphoma.  Cannot rule out metastatic disease from unknown primary.    The patient underwent a right inguinal lymph node biopsy on 08/18/2019 and a bone marrow biopsy on 08/19/2019.  These results are still pending.  We will follow-up when results are available to make further recommendations. -The patient has pancytopenia on his lab work which could be related to a lymphoproliferative disorder.  Iron studies and vitamin B12 level are not decreased.  Folate mildly decreased and he has been started on folic acid 1 mg daily.  Bone marrow biopsy pending. Recommend PRBC transfusion for hemoglobin less than 8.  He is receiving another unit today.  Recommend platelet transfusion if platelets drop below 20,000 or active bleeding.  He does not need a platelet transfusion today. -He was counseled about smoking cessation.    LOS: 6 days   Mikey Bussing, DNP, AGPCNP-BC, AOCNP 08/23/19   ADDENDUM: Hematology/Oncology  Attending: I had a face-to-face encounter with the patient today.  I recommended his care plan.  The patient is examined and I agree with the above note.  This is a very pleasant 58 years old white male with highly suspicious lymphoproliferative disorder, non-Hodgkin lymphoma, pending final pathology.  The patient has significant bone marrow involvement and he underwent a bone marrow biopsy and aspirate but again the final pathology is still pending. He continues to have persistent anemia as well as thrombocytopenia. I will continue with the patient with the supportive care with PRBCs transfusion for now.  There is no need for platelet transfusion unless the patient is actively bleeding or platelets count less than 20,000. Once the final pathology is confirmed, I may consider the patient for at least 1 dose of Rituxan in the hospital and then we will arrange for the patient to receive his a stander chemotherapy on outpatient basis at the [facility].  Thank you for taking good care of Mr. Laskaris, I will continue to follow up the patient with you and assist in his management on as-needed basis. Disclaimer: This note was dictated with voice recognition software. Similar sounding words can inadvertently be transcribed and may be missed upon review. Eilleen Kempf, MD

## 2019-08-23 NOTE — Progress Notes (Signed)
Attempt to begin blood transfusion, upon pre-blood VS check, pt had elevated temperature. Dr. Alfredia Ferguson notified and requested that pt receive ordered Tylenol and reassess temp in 1 hour. Stacey Drain

## 2019-08-23 NOTE — Progress Notes (Signed)
PROGRESS NOTE    Isaac Pierce  JXB:[id number] DOB: [birth year] DOA: 08/17/2019 PCP: Patient, No Pcp Per   Brief Narrative:  HPI per Dr. Shela Leff on 08/17/2019 Isaac Pierce is a 58 y.o. male with a past medical history of chronic back pain, GERD, anxiety, homelessness presenting with a chief complaint of shortness of breath.  Patient states he returned here from [place] about 5 weeks ago and since then has been doing very poorly.  He has no energy and stays in bed all the time.  He has no appetite.  He is feeling short of breath even at rest and his breathing has been getting progressively worse.  He sometimes has a cough productive of white sputum.  He has lost a significant amount of weight.  His legs have been very swollen and his abdomen is also swollen.  He has noticed lumps underneath his axilla and on both sides of his neck.  He wakes up at night drenched in sweat.  States both his parents died from [place].  Denies personal history of malignancy.  He was previously smoking 1 to 2 packs of cigarettes per day but since he has been sick is now only smoking 1 to 4 cigarettes/day.  ED Course: Tachycardic with heart rate in the 110s to 120s.  Tachypneic with respiratory rate in the 20s.  Blood pressure intermittently soft and patient received 1 L normal saline bolus.  Oxygen saturation 96-100% on room air.  Placed on 2 L supplemental oxygen via nasal cannula for increased work of breathing.  Labs showing pancytopenia (WBC count 3.4, hemoglobin 4.6, platelet count 39,000).  FOBT negative.  Other lab abnormalities seen:  Lactic acid 6.4.  Sodium 126.  Bicarb 19, anion gap 15.  BNP normal.  D-dimer elevated at 5.16.  High-sensitivity troponin mildly elevated at 22.  EKG not suggestive of ACS. Pending labs: Pathologist smear review, blood culture x2, SARS-CoV-2 test Chest x-ray showing mild central vascular congestion with small bilateral effusions.  Atelectasis or mild pneumonia at the left  greater than right lung base. Chest CT angiogram (limited exam) negative for PE.  Showing bulky mediastinal, bilateral hilar, and bilateral axillary lymphadenopathy.  Findings concerning for primary lymphoproliferative process such as lymphoma.  Metastatic disease secondary to unknown primary is also a consideration.  Multiple bilateral noncalcified pulmonary nodules measuring up to 10 mm.  Small bilateral pleural effusions, left greater than right with associated compressive atelectasis.  Hepatosplenomegaly within the visualized upper abdomen with ill-defined area of low density within the peripheral aspect of the spleen measuring 3.7 x 2.8 cm, findings concerning for lymphomatous involvement versus metastatic disease versus possible sequela of splenic injury. 2 units PRBCs ordered. ED provider discussed the case with Dr. Julien Nordmann from oncology, will consult in a.m.  **Interim History During the course of his hospitalization he had severe respiratory distress and was administered IV Lasix with improvement and will be continued on it.  Pulmonary was consulted for further evaluation recommendations and medical oncology recommending biopsy and he underwent a lymph node biopsy of the dominant right inguinal lymph node and a bone marrow biopsy.  He is status post 5 units of PRBCs and blood count is stabilized but still on the lower side with a Hb of 8.1. Scheduled IV Lasix given his volume overload and will continue it today as he is improving. Patient continues to spike temperatures and will continue IV cefepime for now.  Do not know if the fevers are related to likely malignancy versus  secondary to infection.  Patient remains significantly volume overloaded and will continue Lasix and obtain abdominal ultrasound as he continues to have some abdominal distention that he feels has worsened.  [place] ultrasound showed splenomegaly but it also revealed that he had a left pleural effusion.  Thoracentesis was ordered  and he had 550 MLS drained off his left lung today.  Pathology results are still not back yet and likely will be back tomorrow.  I spoke with oncology who does not feel the patient is hemolyzing his blood and they recommend keeping his hemoglobin above 7.5  Assessment & Plan:   Principal Problem:   Severe sepsis ([place]) Active Problems:   Malignancy ([place])   Pancytopenia ([facility])   Dyspnea   Pleural effusion   Hyponatremia   Protein-calorie malnutrition, severe  Lactic Acidosis/ Concern for severe sepsis from unknown source and ? Bacteremia -Lactic acid wassignificantly elevated at 6.8 on admission -Although this could be related to underlying malignancy, there remains concern for severe sepsis given tachycardia, tachypnea, and soft blood pressure readings.  No fever initially.  Does have lymphopenia.  Chest CTA not suggestive of pneumonia and is negative for PE.  UA not suggestive of infection. -Received 1 L normal saline bolus and was given more fluid and now is KVO'd   -Continue to monitor blood pressure closely and will not give additional fluid boluses to keep MAP >65 given severe Volume Overload. Will continue IV Lasix -Give broad-spectrum antibiotics including vancomycin and Zosyn but will change to IV Cefepime; Discontinued IV Vancomycin and will stop IV Cefepime after today's completion -Checked procalcitonin level and was 2.60 and worsened today to 3.00 and yesterday was 2.31 -Trending Lactic Acid and trended down from 6.8 -> 2.9 -Blood culture x2 pending aerobic set of 1 blood cultures gram-positive cocci (Staph Species) could be contaminant; Repeat Blood Cx 08/19/2019 showed no growth to date at 4 Days  -Urine culture showed no Growth  -SARS-CoV-2 test Negative  -Repeat CXR today showed Unchanged 9 mm nodule in the right mid lung, seen on comparison CT. No significant reaccumulation of the left effusion post thoracentesis. Stable bandlike areas of atelectasis and/or scarring" -Changed  IV Zosyn to IV cefepime will continue until today ; IV vancomycin is now stopped -Patient continued to spike temperatures and continues to remain tachycardic and a little tachypneic; blood pressure is improved as well as tachypenia; Fever seems to have started resolving   Concern for malignancy/ lymphoproliferative disorder -Patient is presenting with a 5-week history of dyspnea, night sweats, anorexia, significant weight loss, and diffuse lymphadenopathy.  -Chest CT angiogram showing bulky mediastinal, bilateral hilar, and bilateral axillary lymphadenopathy.  Findings concerning for primary lymphoproliferative process such as lymphoma.  Metastatic disease secondary to unknown primary is also consideration.  Also showing multiple bilateral noncalcified pulmonary nodules measuring up to 10 mm.  Hepatosplenomegaly within the visualized upper abdomen with ill-defined area of low density within the peripheral aspect of the spleen measuring 3.7 x 2.8 cm, findings concerning for lymphomatous involvement versus metastatic disease versus possible sequela of splenic injury. -ED provider discussed the case with Dr. Julien Nordmann from oncology -CT abdomen pelvis for further staging and showed "Massive splenomegaly with abdominal and inguinal lymphadenopathy. Findings are suggestive for a lymphoproliferative process such as lymphoma. Evidence for multiple splenic infarcts likely related to the splenomegaly. Cannot exclude a splenic lesion along the left posterior aspect measuring up to 3.8 cm.  Evidence for fluid overload state with diffuse subcutaneous edema, bilateral pleural effusions and ascites.  Re-demonstration pleural-based nodularity at the lung bases. There is also septal thickening and a nodular structure in the right lower lobe. Findings are compatible with a neoplastic or lymphoproliferative process.  No discrete liver lesions.  Aortic Atherosclerosis.Concern for at least mild stenosis involving the proximal  SMA. Ectasia of the celiac trunk." -Since concerning for lymphoma versus metastatic disease from an unknown primary and patient is to undergo an ultrasound-guided lymph node biopsy by IR and had a technically successful ultrasound-guided biopsy of the dominant right inguinal lymph node -Pulmonary was also consulted and recommending biopsy and they doubt that he needs mediastinoscopy -Underwent a CT-guided aspirate and core biopsy of the right iliac bone for bone marrow biopsy as well -Biopsies are still pending -Continued to spike temperatrues and ?If related to Lymphoproliferative disorder; Continuing Abx for now but will stop today   Pancytopenia (leukopenia, severe anemia, and severe thrombocytopenia) -Suspect related to malignancy or lymphoproliferative disorder. On Admission WBC count 3.4, hemoglobin 4.6, platelet count 39,000 on admission.  Blood loss anemia from acute GI bleed less likely given negative FOBT. -Anemia panel done and showed an iron level of 87, TIBC 126, TIBC 230, saturation ratios of 41%, ferritin of 1444, folate of 5.4, and vitamin B12 level 1022 -Patient's hemoglobin/hematocrit went from 4.6/14.3 and is now 7.3/22.8 after 5 units of pRBC's -Oncology consulted and recommending PLT transfusions for hemoglobin less than 7.5 and recommended platelet transfusion of platelets drop below 20,000 or active bleeding -Pathologist smear review ordered  -Medical oncology is also started the patient on folic acid 1 mg p.o. daily -Continue to monitor CBC -WBC is 5.2 now and improved and Platelet Count is 42,000 -Anemia panel will not help now that patient has received blood -Transfused 5 units of PRBCs and will transfuse another 1 unit today because of Above Hgb/Hct; LDH was 675 but Dr. 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   -Repeat CXR as above and had a pleural effusion on the left which was tapped yesterday -BNP normal at 93.7 and 91.8   -Weaned to room air  -BP improved and given IV Lasix again today given his continued chest x-ray findings -Thoracentesis done 08/22/2019 and had 550 mL off of left Lung -Continuous pulse oximetry and maintain O2 saturation greater than 94% -Continue supplemental oxygen via nasal cannula wean O2 as tolerated and currently not wearing Supplemental O2  -Continue to Monitor and Repeat CXR in AM   Hyponatremia, improving -Initially thought to be from Hypovolemia but patient had significant JVD and LE Edema -Stopped IVF and started Diuresis and getting IV Lasix 40 mg BID -Initially Patient may have appeared dehydrated as he Endorsed decreased p.o. intake for over a month.  -Sodium 126 on admission and repeat this AM was 133 -IV fluid hydration now KVO'd  -Continue to monitor CMP in AM  -Check serum osmolarity  Normal anion gap metabolic acidosis -Bicarb 19, anion gap 15 on admission and repeat showed an anion gap of 13, CO2 25, and a chloride of 95.  Patient is not on a diuretic and not endorsing diarrhea but we have started him on Lasix -IV fluid hydration as above and is now [facility] and given another dose of IV Lasix  -Continue to monitor CMP  Bilateral  lower extremity edema/ concern for DVT, improving -Patient has significant bilateral lower extremity edema. Left is still worse than Right  -D-dimer elevated at 5.16.  CT angiogram negative for PE.  There remains concern for DVT given imaging findings consistent with malignancy. -Bilateral lower extremity Dopplers no evidence of the DVT in the right left lower extremity and there is no cystic structures found in popliteal fossa of both extremities -C/w IV Lasix today  again and have scheduled it 40 mg BID and re-evaluate it in AM    Hypomagnesemia -Patient magnesium level this morning is 1.7 -Replete with IV mag sulfate 2 g today -Continue to monitor replete as necessary -Repeat magnesium level in a.m.  Hypokalemia -Patient's potassium this morning was 3.1 -Replete with p.o. potassium chloride 40 mEq twice daily x2 doses -Continue to monitor and replete as necessary -Repeat CMP in a.m.  Troponinemia High-sensitivity troponin mildly elevated at 22 on admission .   -EKG not suggestive of ACS.  Patient is not complaining of chest pain and appears comfortable. -C/w Cardiac monitoring -Trend troponin and a trended up to 128  Tobacco Use -Smoking cessation counseling given and was started on a nicotine patch  Volume Overload -In the setting possible CHF versus hypoalbuminemia but CHF ruled out and he has very poor nutrition -BNP was normal -We will check cardiac echocardiogram and he was given IV Lasix again today at 40 mg BID  -Currently -7.446 L since Admission and is -1.250 Liters today  -Will give TED Hose and he had a Thoracentesis  -Continue monitor signs and symptoms of volume overload and strict I's and O's and daily weights  -Will fluid restrict to 1500 mL  Anxiety -Continue home Hydroxyzine  Chronic Back Pain -Tylenol as needed  GERD -Continue PPI with Pantoprazole 20 mg Daily   Homelessness -Social work consulted for further assistance   HIV screening The patient falls between the ages of 13-64 and should be screened for HIV, therefore HIV testing ordered and is Non-Reactive   Hyperbilirubinemia and abnormal LFTs -AST was 67 ALT was 69 and T bili was 1.4; Now AST is 50, ALT is 21, T bili is 0.7 and trending down -Continue to monitor and trend and check acute hepatitis panel and right upper quadrant ultrasound if necessary -Repeat CMP in a.m.  Severe malnutrition in the context of chronic illness -Nutritionist  consulted for further evaluation recommendations -Nutritionist recommended Ensure Enlive p.o. twice daily, prostat 30 mL p.o. twice daily, multivitamin with mineral daily p.o. and encouraging p.o. intake -Appreciate Nutrition evaluation   Abdominal Distention -In the setting of his Splenomegaly from process as above -Checked KUB and showed "No bowel dilation to suggest obstruction. Bowel is displaced inferiorly by the markedly enlarged spleen. Soft tissues are poorly defined. No evidence of a renal or ureteral stone. There are scattered aortic and iliac artery atherosclerotic calcifications."  -Checked Abdominal U/S and showed "No convincing acute finding. There are small gallstones, gallbladder sludge and 1 small polyp as well as pericholecystic fluid, but no sonographic Murphy's sign. Gallbladder is also only mildly to moderately distended. No convincing acute cholecystitis. Hepatosplenomegaly. Increased echogenicity of the liver consistent with hepatic steatosis. No liver mass. Bilateral pleural effusions. Mild right hydronephrosis. Hypoechoic to anechoic right renal masses that are likely cysts but not fully characterized."  DVT prophylaxis: SCDs Code Status: FULL CODE Family Communication: No family present at bedside  Disposition Plan: Transferred to Telemetry now that he has improved.  Further evaluation by oncology for Treatment  Consultants:   Medical Oncology  Pulmonary  Interventional Radiology   Procedures:  ECHOCARDIOGRAM IMPRESSIONS    1. Left ventricular ejection fraction, by visual estimation, is 60 to 65%. The left ventricle has normal function. There is no left ventricular hypertrophy.  2. Indeterminate diastolic filling due to E-A fusion.  3. The left ventricle has no regional wall motion abnormalities.  4. Global right ventricle has normal systolic function.The right ventricular size is normal. No increase in right ventricular wall thickness.  5. Left atrial  size was mildly dilated.  6. Right atrial size was normal.  7. Trivial pericardial effusion is present.  8. The mitral valve is normal in structure. No evidence of mitral valve regurgitation. No evidence of mitral stenosis.  9. The tricuspid valve is normal in structure. Tricuspid valve regurgitation is trivial. 10. The aortic valve is tricuspid. Aortic valve regurgitation is not visualized. No evidence of aortic valve sclerosis or stenosis. 11. TR signal is inadequate for assessing pulmonary artery systolic pressure. 12. The inferior vena cava is normal in size with greater than 50% respiratory variability, suggesting right atrial pressure of 3 mmHg.  FINDINGS  Left Ventricle: Left ventricular ejection fraction, by visual estimation, is 60 to 65%. The left ventricle has normal function. The left ventricle has no regional wall motion abnormalities. The left ventricular internal cavity size was the left  ventricle is normal in size. There is no left ventricular hypertrophy. Indeterminate diastolic filling due to E-A fusion.  Right Ventricle: The right ventricular size is normal. No increase in right ventricular wall thickness. Global RV systolic function is has normal systolic function.  Left Atrium: Left atrial size was mildly dilated.  Right Atrium: Right atrial size was normal in size  Pericardium: Trivial pericardial effusion is present.  Mitral Valve: The mitral valve is normal in structure. No evidence of mitral valve stenosis by observation. No evidence of mitral valve regurgitation.  Tricuspid Valve: The tricuspid valve is normal in structure. Tricuspid valve regurgitation is trivial.  Aortic Valve: The aortic valve is tricuspid. Aortic valve regurgitation is not visualized. The aortic valve is structurally normal, with no evidence of sclerosis or stenosis.  Pulmonic Valve: The pulmonic valve was normal in structure. Pulmonic valve regurgitation is not  visualized.  Aorta: The aortic root is normal in size and structure.  Venous: The inferior vena cava is normal in size with greater than 50% respiratory variability, suggesting right atrial pressure of 3 mmHg.  IAS/Shunts: No atrial level shunt detected by color flow Doppler.     LEFT VENTRICLE PLAX 2D LVIDd:         5.50 cm Diastology LVIDs:         3.40 cm LV e' lateral: 16.20 cm/s LV PW:         0.90 cm LV e' medial:  10.40 cm/s LV IVS:        0.90 cm LV SV:         100 ml LV SV Index:   52.54    RIGHT VENTRICLE RV S prime:     23.40 cm/s TAPSE (M-mode): 2.7 cm  LEFT ATRIUM             Index LA diam:        3.80 cm 1.98 cm/m LA Vol (A2C):   87.1 ml 45.46 ml/m LA Vol (A4C):   61.0 ml 31.84 ml/m LA Biplane Vol: 72.9 ml 38.05 ml/m  AORTIC VALVE LVOT Vmax:   149.00 cm/s LVOT Vmean:  102.000  cm/s LVOT VTI:    0.266 m   AORTA Ao Root diam: 3.10 cm    SHUNTS Systemic VTI: 0.27 m  THORACENTESIS 08/22/2019 Successful US guided diagnostic and therapeutic left thoracentesis. Yielded 550 mL of yellow fluid. Pt tolerated procedure well. No immediate complications.  Specimen was sent for labs. CXR ordered.  EBL < 5 mL   Antimicrobials:  Anti-infectives (From admission, onward)   Start     Dose/Rate Route Frequency Ordered Stop   08/18/19 2200  ceFEPIme (MAXIPIME) 2 g in sodium chloride 0.9 % 100 mL IVPB     2 g 200 mL/hr over 30 Minutes Intravenous Every 8 hours 08/18/19 1534     08/18/19 1200  vancomycin (VANCOCIN) 1,250 mg in sodium chloride 0.9 % 250 mL IVPB  Status:  Discontinued     1,250 mg 166.7 mL/hr over 90 Minutes Intravenous Every 12 hours 08/18/19 0621 08/19/19 1733   08/18/19 0600  piperacillin-tazobactam (ZOSYN) IVPB 3.375 g     3.375 g 12.5 mL/hr over 240 Minutes Intravenous Every 8 hours 08/17/19 2251 08/18/19 1800   08/17/19 2300  vancomycin (VANCOCIN) 1,500 mg in sodium chloride 0.9 % 500 mL IVPB     1,500 mg 250 mL/hr over 120  Minutes Intravenous  Once 08/17/19 2250 08/18/19 0332   08/17/19 2300  piperacillin-tazobactam (ZOSYN) IVPB 3.375 g     3.375 g 100 mL/hr over 30 Minutes Intravenous  Once 08/17/19 2250 08/18/19 0131     Subjective: Seen and examined at bedside and he states he is feeling well today.  Thinks his legs are less swollen today than they were yesterday.  Left leg is still pretty swollen compared to right.  No nausea or vomiting.  Surprised about how her food came out of his fungus today.  Denies any lightheadedness or dizziness.  Wanted to take a shower.  No other concerns or complaints at this time.  Objective: Vitals:   08/22/19 1537 08/22/19 2012 08/22/19 2128 08/23/19 0458  BP: 115/60  120/67 126/70  Pulse:   (!) 118 (!) 117  Resp:   (!) 21 20  Temp:   (!) 96.5 F (35.8 C) 98.1 F (36.7 C)  TempSrc:    Oral  SpO2: 94% 94% 95% 96%  Weight:      Height:        Intake/Output Summary (Last 24 hours) at 08/23/2019 1317 Last data filed at 08/23/2019 1200 Gross per 24 hour  Intake 1098.1 ml  Output 2130 ml  Net -1031.9 ml   Filed Weights   08/18/19 0107 08/18/19 0400 08/20/19 0414  Weight: 72.4 kg 72.4 kg 68.9 kg   Examination: Physical Exam:  Constitutional: Thin Caucasian male currently in no acute distress, Eyes: Lids and conjunctivae normal, sclerae anicteric  ENMT: External Ears, Nose appear normal. Grossly normal hearing. Mucous membranes are moist.  Dentition Neck: Appears normal, supple, no cervical masses, normal ROM, no appreciable thyromegaly; no appreciable JVD today Respiratory: Diminished to auscultation bilaterally with coarse breath sounds and crackles have improved, no wheezing, rales, rhonchi or crackles. Normal respiratory effort and patient is not tachypenic. No accessory muscle use.  Unlabored breathing and not wearing supplemental oxygen via nasal cannula Cardiovascular: Tachycardic rate but regular rhythm, no murmurs / rubs / gallops. S1 and S2 auscultated.   Has 1+ lower extremity edema with the left leg being worse than the right Abdomen: Soft, non-tender, Distended.  Has palpable splenomegaly. bowel sounds positive x4.  GU: Deferred. Musculoskeletal: No clubbing / cyanosis  of digits/nails. No joint deformity upper and lower extremities.  Skin: No rashes, lesions, ulcers on limited skin evaluation but does have some scattered tattoos. No induration; Warm and dry.  Neurologic: CN 2-12 grossly intact with no focal deficits. Romberg sign and cerebellar reflexes not assessed.  Psychiatric: Normal judgment and insight. Alert and oriented x 3. Pleasant mood and appropriate affect and not as anxious today.   Data Reviewed: I have personally reviewed following labs and imaging studies  CBC: Recent Labs  Lab 08/19/19 0235 08/19/19 1921 08/20/19 0216 08/21/19 0539 08/22/19 0531 08/23/19 0502  WBC 3.8*  --  4.2 4.7 5.2 4.9  NEUTROABS 1.0*  --  1.2* 1.5* 1.6* 1.2*  HGB 7.2* 8.5* 7.8* 7.4* 8.1* 7.3*  HCT 21.7* 26.2* 23.9* 22.6* 25.0* 22.8*  MCV 84.8  --  85.7 84.3 86.2 86.4  PLT 47*  --  38* 38* 42* 46*   Basic Metabolic Panel: Recent Labs  Lab 08/19/19 0235 08/20/19 0216 08/21/19 0539 08/22/19 0531 08/23/19 0502  NA 127* 131* 134* 132* 133*  K 3.6 3.5 3.8 3.8 3.1*  CL 92* 96* 97* 93* 95*  CO2 22 21* _0 GLUCOSE 118* 107* 80 89 127*  BUN _1 25* 25*  CREATININE 0.68 0.63 0.61 0.71 0.72  CALCIUM 8.3* 8.0* 8.7* 8.7* 8.3*  MG 1.5* 1.7 1.5* 1.5* 1.7  PHOS 3.0 3.0 3.4 3.5 3.0   GFR: Estimated Creatinine Clearance: 100.5 mL/min (by C-G formula based on SCr of 0.72 mg/dL). Liver Function Tests: Recent Labs  Lab 08/19/19 0235 08/20/19 0216 08/21/19 0539 08/22/19 0531 08/23/19 0502  AST 67* 67* 61* 53* 50*  ALT _2 ALKPHOS 147* 153* 175* 175* 174*  BILITOT 1.3* 1.0 0.8 1.1 0.7  PROT 4.3* 4.3* 4.4* 4.5* 4.3*  ALBUMIN 1.8* 1.8* 1.8* 2.0* 1.7*   No results for input(s): LIPASE, AMYLASE in the last 168  hours. No results for input(s): AMMONIA in the last 168 hours. Coagulation Profile: Recent Labs  Lab 08/18/19 1314  INR 1.3*   Cardiac Enzymes: No results for input(s): CKTOTAL, CKMB, CKMBINDEX, TROPONINI in the last 168 hours. BNP (last 3 results) No results for input(s): PROBNP in the last 8760 hours. HbA1C: No results for input(s): HGBA1C in the last 72 hours. CBG: No results for input(s): GLUCAP in the last 168 hours. Lipid Profile: No results for input(s): CHOL, HDL, LDLCALC, TRIG, CHOLHDL, LDLDIRECT in the last 72 hours. Thyroid Function Tests: No results for input(s): TSH, T4TOTAL, FREET4, T3FREE, THYROIDAB in the last 72 hours. Anemia Panel: No results for input(s): VITAMINB12, FOLATE, FERRITIN, TIBC, IRON, RETICCTPCT in the last 72 hours. Sepsis Labs: Recent Labs  Lab 08/18/19 0134 08/18/19 0437 08/18/19 0749 08/18/19 1032 08/19/19 0235 08/20/19 0216  PROCALCITON  --   --  2.60  --  3.00 2.31  LATICACIDVEN 6.0* 4.6* 3.0* 2.9*  --   --     Recent Results (from the past 240 hour(s))  Culture, Urine     Status: None   Collection Time: 08/17/19  7:14 PM   Specimen: Urine, Clean Catch  Result Value Ref Range Status   Specimen Description   Final    URINE, CLEAN CATCH Performed at [facility], [place] [address] [place], [place] [address]    Special Requests   Final    NONE Performed at [facility], [place] [address], [place], [place] [address]    Culture   Final  NO GROWTH Performed at [facility], [place] [address] [place], [place] [address]    Report Status 08/19/2019 FINAL  Final  SARS CORONAVIRUS 2 (TAT 6-24 HRS) Nasopharyngeal Nasopharyngeal Swab     Status: None   Collection Time: 08/17/19  8:52 PM   Specimen: Nasopharyngeal Swab  Result Value Ref Range Status   SARS Coronavirus 2 NEGATIVE NEGATIVE Final    Comment: (NOTE) SARS-CoV-2 target nucleic acids are NOT DETECTED. The SARS-CoV-2 RNA is generally  detectable in upper and lower respiratory specimens during the acute phase of infection. Negative results do not preclude SARS-CoV-2 infection, do not rule out co-infections with other pathogens, and should not be used as the sole basis for treatment or other patient management decisions. Negative results must be combined with clinical observations, patient history, and epidemiological information. The expected result is Negative. Fact Sheet for Patients: SugarRoll.be Fact Sheet for Healthcare Providers: https://www.woods-mathews.com/ This test is not yet approved or cleared by the [place] FDA and  has been authorized for detection and/or diagnosis of SARS-CoV-2 by FDA under an Emergency Use Authorization (EUA). This EUA will remain  in effect (meaning this test can be used) for the duration of the COVID-19 declaration under Section 56 4(b)(1) of the Act, 21 U.S.C. section 360bbb-3(b)(1), unless the authorization is terminated or revoked sooner. Performed at [facility], [place] [address] [place], [place] [address]   Blood Culture (routine x 2)     Status: Abnormal   Collection Time: 08/17/19  8:55 PM   Specimen: BLOOD  Result Value Ref Range Status   Specimen Description   Final    BLOOD LEFT ANTECUBITAL Performed at [facility] [address] [place], [place] [address]    Special Requests   Final    BOTTLES DRAWN AEROBIC AND ANAEROBIC Blood Culture adequate volume Performed at [facility] [address] [place], [place] [address]    Culture  Setup Time   Final    GRAM POSITIVE COCCI AEROBIC BOTTLE ONLY CRITICAL RESULT CALLED TO, READ BACK BY AND VERIFIED WITH: J. Warren, PHARMD ([facility]) AT 2202 ON 08/18/19 BY C. JESSUP, MT.    Culture (A)  Final    STAPHYLOCOCCUS SPECIES (COAGULASE NEGATIVE) THE SIGNIFICANCE OF ISOLATING THIS ORGANISM FROM A SINGLE SET OF BLOOD CULTURES WHEN MULTIPLE  SETS ARE DRAWN IS UNCERTAIN. PLEASE NOTIFY THE MICROBIOLOGY DEPARTMENT WITHIN ONE WEEK IF SPECIATION AND SENSITIVITIES ARE REQUIRED. Performed at [facility], [place] [address], [place], [place] [address]    Report Status 08/19/2019 FINAL  Final  Blood Culture (routine x 2)     Status: None   Collection Time: 08/17/19  8:55 PM   Specimen: BLOOD  Result Value Ref Range Status   Specimen Description   Final    BLOOD RIGHT ANTECUBITAL Performed at [facility] [address] [place], [place] [address]    Special Requests   Final    BOTTLES DRAWN AEROBIC AND ANAEROBIC Blood Culture results may not be optimal due to an excessive volume of blood received in culture bottles Performed at [facility] [address] [place], [place] [address]    Culture   Final    NO GROWTH 5 DAYS Performed at [facility], [place] [address], [place], [place] [address]    Report Status 08/22/2019 FINAL  Final  MRSA PCR Screening     Status: None   Collection Time: 08/18/19 12:22 AM  Specimen: Nasal Mucosa; Nasopharyngeal  Result Value Ref Range Status   MRSA by PCR NEGATIVE NEGATIVE Final    Comment:        The GeneXpert MRSA Assay (FDA approved for NASAL specimens only), is one component of a comprehensive MRSA colonization surveillance program. It is not intended to diagnose MRSA infection nor to guide or monitor treatment for MRSA infections. Performed at [facility], [place] [address], [place], [place] [address]   Culture, blood (routine x 2)     Status: None (Preliminary result)   Collection Time: 08/19/19  5:48 PM   Specimen: BLOOD RIGHT HAND  Result Value Ref Range Status   Specimen Description   Final    BLOOD RIGHT HAND Performed at [facility], [place] [address], [place], [place] [address]    Special Requests   Final    BOTTLES DRAWN AEROBIC ONLY Blood Culture adequate volume Performed at [facility] [address] [place], [place] [address]    Culture   Final    NO GROWTH 4 DAYS Performed at [facility], [place] [address] [place], [place] [address]    Report Status PENDING  Incomplete  Culture, blood (routine x 2)     Status: None (Preliminary result)   Collection Time: 08/19/19  5:48 PM   Specimen: BLOOD LEFT HAND  Result Value Ref Range Status   Specimen Description   Final    BLOOD LEFT HAND Performed at [facility], [place] [address] [place], [place] [address]    Special Requests   Final    BOTTLES DRAWN AEROBIC ONLY Blood Culture adequate volume Performed at [facility] [address] [place], [place] [address]    Culture   Final    NO GROWTH 4 DAYS Performed at [facility], [place] [address] [place], [place] [address]    Report Status PENDING  Incomplete  Body fluid culture     Status: None (Preliminary result)   Collection Time: 08/22/19  4:00 PM   Specimen: PATH Cytology Pleural fluid  Result Value Ref Range Status   Specimen Description   Final    PLEURAL Performed at [facility] [address] [place], [place] [address]    Special Requests   Final    NONE Performed at [facility], [place] [address], [place],  [address]    Gram Stain   Final    ABUNDANT WBC PRESENT, PREDOMINANTLY MONONUCLEAR NO ORGANISMS SEEN    Culture   Final    NO GROWTH < 12 HOURS Performed at [facility] [address] [place],  [address]    Report Status PENDING  Incomplete     Radiology Studies: Dg Chest 1 View  Result Date: 08/22/2019 CLINICAL DATA:  Status post thoracentesis. EXAM: CHEST  1 VIEW COMPARISON:  Earlier today at 0400 hours. FINDINGS: 1550 hours. Patient rotated left. Midline trachea. Normal heart size. Interval resolution of left-sided pleural fluid. No right-sided effusion. No pneumothorax. Skin fold projects over the left lower chest. Significantly  improved left base airspace disease. Right infrahilar atelectasis remains. Again identified is a right midlung 7 mm nodule IMPRESSION: Interval left thoracentesis, without pneumothorax. Resolution of left lower lobe airspace disease, with mild right infrahilar atelectasis remaining. Suspicion of a 7 mm right midlung nodule, as before. Electronically Signed   By: Abigail Miyamoto M.D.   On: 08/22/2019 16:17   Dg  Abd 1 View  Result Date: 08/21/2019 CLINICAL DATA:  Abdominal swelling and discomfort. EXAM: ABDOMEN - 1 VIEW COMPARISON:  CT, 08/18/2019. FINDINGS: No bowel dilation to suggest obstruction. Bowel is displaced inferiorly by the markedly enlarged spleen. Soft tissues are poorly defined. No evidence of a renal or ureteral stone. There are scattered aortic and iliac artery atherosclerotic calcifications. IMPRESSION: 1. No bowel obstruction. 2. Enlarged spleen. Poorly defined soft tissues consistent with ascites as noted on the prior CT. Electronically Signed   By: Lajean Manes M.D.   On: 08/21/2019 14:20   US Abdomen Complete  Result Date: 08/21/2019 CLINICAL DATA:  Abdominal pain and distention. History of alcohol and drug abuse. EXAM: ABDOMEN ULTRASOUND COMPLETE COMPARISON:  None. FINDINGS: Gallbladder: Small gallstones and sludge. At least 1 polyp noted measuring 3 mm. There is pericholecystic fluid. No sonographic Murphy's sign. Common bile duct: Diameter: 4 mm Liver: Liver appears mildly enlarged. Increased liver parenchymal echogenicity. No mass or focal lesion. Portal vein is patent on color Doppler imaging with normal direction of blood flow towards the liver. IVC: No abnormality visualized. Pancreas: Hypoechoic lesion either adjacent to or within the pancreatic head measuring 2.3 x 1.4 cm. This could reflect a pancreatic mass or adjacent enlarged gastrohepatic ligament lymph node a less well-defined hypoechoic area is seen in the pancreas below this. No pancreatic duct dilation. Spleen: Enlarged  spleen measuring 24 x 9 x 23 cm. No splenic mass or focal lesion. Right Kidney: Length: 12.1 cm. Normal parenchymal echogenicity. There are hypo to anechoic cortical lesions that are consistent with cysts. Mild hydronephrosis. No stones. Left Kidney: Length: 12.5 cm. Echogenicity within normal limits. No mass or hydronephrosis visualized. Abdominal aorta: No aneurysm visualized. Other findings: Bilateral pleural effusions. IMPRESSION: 1. No convincing acute finding. There are small gallstones, gallbladder sludge and 1 small polyp as well as pericholecystic fluid, but no sonographic Murphy's sign. Gallbladder is also only mildly to moderately distended. No convincing acute cholecystitis. 2. Hepatosplenomegaly. Increased echogenicity of the liver consistent with hepatic steatosis. No liver mass. 3. Bilateral pleural effusions. 4. Mild right hydronephrosis. Hypoechoic to anechoic right renal masses that are likely cysts but not fully characterized. Electronically Signed   By: Lajean Manes M.D.   On: 08/21/2019 14:28   Dg Chest Port 1 View  Result Date: 08/23/2019 CLINICAL DATA:  Shortness of breath EXAM: PORTABLE CHEST 1 VIEW COMPARISON:  Radiograph 08/22/2019 FINDINGS: Mild left anterior obliquity. Redemonstration of a 9 mm nodule in the right mid lung, seen on comparison CT. Stable bandlike areas of atelectasis and/or scarring. Additional hazy basilar atelectatic changes are present. No significant reaccumulation of the left effusion. No right effusion. Cardiomediastinal contours are stable. No acute osseous or soft tissue abnormality. IMPRESSION: 1. Unchanged 9 mm nodule in the right mid lung, seen on comparison CT. 2. No significant reaccumulation of the left effusion post thoracentesis. 3. Stable bandlike areas of atelectasis and/or scarring. Electronically Signed   By: Lovena Le M.D.   On: 08/23/2019 06:00   Dg Chest Port 1 View  Result Date: 08/22/2019 CLINICAL DATA:  Shortness of breath. EXAM:  PORTABLE CHEST 1 VIEW COMPARISON:  08/21/2019 FINDINGS: 0400 hours. Leftward patient rotation. The cardio pericardial silhouette is enlarged. Interval progression of retrocardiac left base collapse/consolidation with persistent small left effusion. Infrahilar right base atelectasis or infiltrate noted. Nodular density noted at the left base may be a nipple shadow. IMPRESSION: Rotated film with retrocardiac left base collapse/consolidation and effusion. Small nodular density at the right base  superimposed on the right fifth rib. Attention on follow-up recommended. Electronically Signed   By: Misty Stanley M.D.   On: 08/22/2019 07:03   Scheduled Meds:  sodium chloride   Intravenous Once   sodium chloride   Intravenous Once   Chlorhexidine Gluconate Cloth  6 each Topical Daily   feeding supplement (ENSURE ENLIVE)  237 mL Oral BID BM   feeding supplement (PRO-STAT SUGAR FREE 64)  30 mL Oral BID   folic acid  1 mg Oral Daily   furosemide  40 mg Intravenous BID   hydrocortisone   Rectal TID   ipratropium  0.5 mg Nebulization BID   levalbuterol  0.63 mg Nebulization BID   mouth rinse  15 mL Mouth Rinse BID   multivitamin with minerals  1 tablet Oral Daily   nicotine  7 mg Transdermal Daily   nystatin  5 mL Mouth/Throat QID   pantoprazole  20 mg Oral Daily   potassium chloride  40 mEq Oral BID   Continuous Infusions:  ceFEPime (MAXIPIME) IV 2 g (08/23/19 0505)   sodium chloride      LOS: 6 days   Kerney Elbe, DO [facility] PAGER is on AMION  If 7PM-7AM, please contact night-coverage [email or web address]

## 2019-08-23 NOTE — Plan of Care (Signed)

## 2019-08-24 LAB — CULTURE, BLOOD (ROUTINE X 2)
Culture: NO GROWTH
Culture: NO GROWTH
Special Requests: ADEQUATE
Special Requests: ADEQUATE

## 2019-08-24 LAB — ACID FAST SMEAR (AFB, MYCOBACTERIA): Acid Fast Smear: NEGATIVE

## 2019-08-24 LAB — COMPREHENSIVE METABOLIC PANEL
ALT: 27 U/L (ref 0–44)
AST: 68 U/L — ABNORMAL HIGH (ref 15–41)
Albumin: 1.9 g/dL — ABNORMAL LOW (ref 3.5–5.0)
Alkaline Phosphatase: 257 U/L — ABNORMAL HIGH (ref 38–126)
Anion gap: 11 (ref 5–15)
BUN: 27 mg/dL — ABNORMAL HIGH (ref 6–20)
CO2: 26 mmol/L (ref 22–32)
Calcium: 8.5 mg/dL — ABNORMAL LOW (ref 8.9–10.3)
Chloride: 97 mmol/L — ABNORMAL LOW (ref 98–111)
Creatinine, Ser: 0.74 mg/dL (ref 0.61–1.24)
GFR calc Af Amer: >60 mL/min (ref >60)
GFR calc non Af Amer: >60 mL/min (ref >60)
Glucose, Bld: 90 mg/dL (ref 70–99)
Potassium: 3.7 mmol/L (ref 3.5–5.1)
Sodium: 134 mmol/L — ABNORMAL LOW (ref 135–145)
Total Bilirubin: 1 mg/dL (ref 0.3–1.2)
Total Protein: 4.6 g/dL — ABNORMAL LOW (ref 6.5–8.1)

## 2019-08-24 LAB — CBC WITH DIFFERENTIAL/PLATELET
Abs Immature Granulocytes: 0.07 10*3/uL (ref 0.00–0.07)
Basophils Absolute: 0 10*3/uL (ref 0.0–0.1)
Basophils Relative: 0 %
Eosinophils Absolute: 0 10*3/uL (ref 0.0–0.5)
Eosinophils Relative: 0 %
HCT: 23.9 % — ABNORMAL LOW (ref 39.0–52.0)
Hemoglobin: 7.9 g/dL — ABNORMAL LOW (ref 13.0–17.0)
Immature Granulocytes: 1 %
Lymphocytes Relative: 46 %
Lymphs Abs: 2.3 10*3/uL (ref 0.7–4.0)
MCH: 28.6 pg (ref 26.0–34.0)
MCHC: 33.1 g/dL (ref 30.0–36.0)
MCV: 86.6 fL (ref 80.0–100.0)
Monocytes Absolute: 1.4 10*3/uL — ABNORMAL HIGH (ref 0.1–1.0)
Monocytes Relative: 27 %
Neutro Abs: 1.4 10*3/uL — ABNORMAL LOW (ref 1.7–7.7)
Neutrophils Relative %: 26 %
Platelets: 46 10*3/uL — ABNORMAL LOW (ref 150–400)
RBC: 2.76 MIL/uL — ABNORMAL LOW (ref 4.22–5.81)
RDW: 21.2 % — ABNORMAL HIGH (ref 11.5–15.5)
WBC: 5.2 10*3/uL (ref 4.0–10.5)
nRBC: 0 % (ref 0.0–0.2)

## 2019-08-24 LAB — TRIGLYCERIDES, BODY FLUIDS: Triglycerides, Fluid: 31 mg/dL

## 2019-08-24 LAB — MAGNESIUM: Magnesium: 1.9 mg/dL (ref 1.7–2.4)

## 2019-08-24 LAB — CYTOLOGY - NON PAP

## 2019-08-24 LAB — PHOSPHORUS: Phosphorus: 2.9 mg/dL (ref 2.5–4.6)

## 2019-08-24 MED ORDER — TRAMADOL HCL 50 MG PO TABS
50.0000 mg | ORAL_TABLET | Freq: Four times a day (QID) | ORAL | Status: DC | PRN
  Administered 2019-08-24 – 2019-09-06 (×6): 50 mg via ORAL
  Filled 2019-08-24 (×7): qty 1

## 2019-08-24 MED ORDER — TRAMADOL HCL 50 MG PO TABS
50.0000 mg | ORAL_TABLET | Freq: Once | ORAL | Status: AC
  Administered 2019-08-24: 50 mg via ORAL
  Filled 2019-08-24: qty 1

## 2019-08-24 NOTE — TOC Initial Note (Addendum)
Transition of Care [facility]) - Initial/Assessment Note    Patient Details  Name: Isaac Pierce MRN: [id number] Date of Birth: 1961/08/16  Transition of [facility]) CM/SW Contact:    Lia Hopping, [place] Phone Number: 08/24/2019, 4:05 PM  Clinical Narrative:                 CSW met with the patient at bedside to discuss the patient home situation. Patient reports he is not homeless and lives in a mobile home. Patient reports he recently moved from [place] and has made [facility] to be closer to his sister. Patient reports, "I have been very very sick, to the point I thought I should just give up on life but getting help has really changed me." Patient reports he is still currently waiting on his biopsy results. Patient reports discussing HCPOA with his sister and requested a packet so that his sister could complete it. The chaplain will need to contacted to assist with the notarization process.  TOC team will assist with any discharge needs if they arise.     Expected Discharge Plan: Home/Self Care Barriers to Discharge: Continued Medical Work up   Patient Goals and CMS Choice Patient states their goals for this hospitalization and ongoing recovery are:: "I dont't know what the results are going to say, to be honest  I am kind of scared."      Expected Discharge Plan and Services Expected Discharge Plan: Home/Self Care In-house Referral: Clinical Social Work     Living arrangements for the past 2 months: Mobile Home                                      Prior Living Arrangements/Services Living arrangements for the past 2 months: Mobile Home Lives with:: Self Patient language and need for interpreter reviewed:: No Do you feel safe going back to the place where you live?: Yes      Need for Family Participation in Patient Care: Yes (Comment) Care giver support system in place?: Yes (comment)   Criminal Activity/Legal Involvement Pertinent to Current Situation/Hospitalization: No  - Comment as needed  Activities of Daily Living Home Assistive Devices/Equipment: Eyeglasses ADL Screening (condition at time of admission) Patient's cognitive ability adequate to safely complete daily activities?: Yes Is the patient deaf or have difficulty hearing?: No Does the patient have difficulty seeing, even when wearing glasses/contacts?: No Does the patient have difficulty concentrating, remembering, or making decisions?: No Patient able to express need for assistance with ADLs?: Yes Does the patient have difficulty dressing or bathing?: No Independently performs ADLs?: Yes (appropriate for developmental age) Does the patient have difficulty walking or climbing stairs?: Yes(and swelling) Weakness of Legs: Both Weakness of Arms/Hands: None  Permission Sought/Granted                  Emotional Assessment Appearance:: Appears stated age Attitude/Demeanor/Rapport: Engaged Affect (typically observed): Calm, Accepting, Adaptable Orientation: : Oriented to Self, Oriented to Place, Oriented to  Time, Oriented to Situation Alcohol / Substance Use: Not Applicable Psych Involvement: No (comment)  Admission diagnosis:  SOB (shortness of breath) [R06.02] Anemia, unspecified type [D64.9] 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 Protein-calorie malnutrition, severe 08/19/2019  . Malignancy ([place]) 08/17/2019  . Severe sepsis ([place]) 08/17/2019  . Pancytopenia ([place]) 08/17/2019  .  Dyspnea 08/17/2019  . Pleural effusion 08/17/2019  . Hyponatremia 08/17/2019   PCP:  Patient, No Pcp Per Pharmacy:  No Pharmacies Listed    Social Determinants of Health (SDOH) Interventions    Readmission Risk Interventions No flowsheet data found.  

## 2019-08-24 NOTE — Progress Notes (Signed)
Pt heart rate remains sinus tach 110-115. Other vss, no complaints.  MD aware, will continue to monitor

## 2019-08-24 NOTE — Progress Notes (Addendum)
PROGRESS NOTE    Isaac Pierce  EUM:[id number] DOB: 1960/11/07 DOA: 08/17/2019 PCP: Patient, No Pcp Per   Brief Narrative:  Patient is a 58 year old male with history of chronic back pain, GERD, anxiety, homelessness who initially presented with shortness of breath.  He complained of generalized weakness, loss of appetite, shortness of breath, productive cough, recent weight loss, leg swelling and distended abdomen when he presented.  He also noticed lumps underneath his axilla and neck.  He has family history of lymphoma.  He was smoking 1 to 2 packs of cigarettes a day.  When he presented he was tachycardic.  Chest x-ray showed mild vascular congestion, small bilateral effusion.  CT angiogram was negative for PE but showed bulky mediastinal, bilateral hilar, bilateral axillary lymphadenopathy.  Findings are concerning for primary lymphoproliferative process such as lymphoma.  Also found to have noncalcified pulmonary nodules measuring up to 10 mm and also hepatosplenomegaly.  He was also anemic on presentation so he was transfused with PRBC.  Oncology consulted.  Hospital course remarkable for respiratory distress which improved with Lasix.  He underwent biopsy of the right inguinal lymph node and bone marrow biopsy.  Total of 5 units of PRBCs during this hospitalization.  He is persistently febrile due to lymphoma.  Also underwent thoracentesis for left-sided pleural effusion.We are waiting for biopsy report.  Oncology planning to start chemotherapy.  Assessment & Plan:   Principal Problem:   Severe sepsis ([facility]) Active Problems:   Malignancy (HCC)   Pancytopenia (HCC)   Dyspnea   Pleural effusion   Hyponatremia   Protein-calorie malnutrition, severe   Lymphoproliferative disorder/lymphoma: Presented with 5-day history of dyspnea on exertion, nausea, significant weight loss, diffuse lymphadenopathy.  CT angiogram showed bulky mediastinal, bilateral hilar, bilateral axillary lymphadenopathy.   Also found to have pulmonary nodules, hepatosplenomegaly.  Underwent CT abdomen/pelvis for further staging which showed massive splenomegaly with abdominal and inguinal lymphadenopathy.  Underwent ultrasound-guided liver biopsy by IR of right inguinal lymph node which showed monoclonal CD5 positive B-cell population.  Also underwent CT-guided aspiration and core biopsy of right iliac bone. Report pending. Oncology following and planning to start on chemotherapy.  Concern for sepsis/lactic acidosis/fever: Lactic acid was elevated on presentation.  Currently improved.  He is persistently febrile from his lymphoma.  Blood cultures have been negative so far.  Antibiotics have been discontinued.  LA improved.  One of the blood culture set showed gram-positive cocci most likely contaminant.  Chest x-ray does not show any clear pneumonia.  Pancytopenia: Has leukopenia,anemia,thrombocytopenia.  So far transfused with a total of 5 units of PRBC.  Also has thrombocytopenia.  Will transfuse with platelets if platelets drop below 20000.  Continue folic acid.  Avoid anticoagulation.  Dyspnea/left-sided pleural effusion: Underwent thoracentesis by IR with removal of 550 mL of yellow fluid.  Currently does not complain of any shortness of breath.  Saturating fine on room air.  Hyponatremia: Improved  Bilateral lower extremity edema/concern for DVT: Significantly improved.  D-dimer was elevated at 5.  CT angiogram negative for PE.  Doppler did not show any DVT.On lasix Echocardiogram showed normal ejection fraction.  Tobacco abuse: Counseled cessation.  Continue nicotine patch.  GERD: Continue PPI  Elevated liver enzymes: Due to abdominal lymphadenopathy.  Continue to monitor.  Abdominal distention: Secondary to lymphadenopathy, hepatosplenomegaly.  Denies any abdominal pain today.  Severe protein calorie malnutrition: Dietitian following.        Nutrition Problem: Severe Malnutrition Etiology:  chronic illness(concern for lymphoma)  DVT prophylaxis:SCD Code Status: Full Family Communication: None present at the bedside Disposition Plan: Likely home after full work-up   Consultants: Oncology, PCCM, IR  Procedures: Lymph node biopsy, bone marrow biopsy, thoracentesis  Antimicrobials:  Anti-infectives (From admission, onward)   Start     Dose/Rate Route Frequency Ordered Stop   08/18/19 2200  ceFEPIme (MAXIPIME) 2 g in sodium chloride 0.9 % 100 mL IVPB     2 g 200 mL/hr over 30 Minutes Intravenous Every 8 hours 08/18/19 1534 08/23/19 2255   08/18/19 1200  vancomycin (VANCOCIN) 1,250 mg in sodium chloride 0.9 % 250 mL IVPB  Status:  Discontinued     1,250 mg 166.7 mL/hr over 90 Minutes Intravenous Every 12 hours 08/18/19 0621 08/19/19 1733   08/18/19 0600  piperacillin-tazobactam (ZOSYN) IVPB 3.375 g     3.375 g 12.5 mL/hr over 240 Minutes Intravenous Every 8 hours 08/17/19 2251 08/18/19 1800   08/17/19 2300  vancomycin (VANCOCIN) 1,500 mg in sodium chloride 0.9 % 500 mL IVPB     1,500 mg 250 mL/hr over 120 Minutes Intravenous  Once 08/17/19 2250 08/18/19 0332   08/17/19 2300  piperacillin-tazobactam (ZOSYN) IVPB 3.375 g     3.375 g 100 mL/hr over 30 Minutes Intravenous  Once 08/17/19 2250 08/18/19 0131      Subjective:  Patient seen and examined the bedside this morning currently hemodynamically stable.  Looks comfortable during my evaluation.  Denies any chest pain or shortness of breath.  His abdominal distention and lower extremity edema have much improved.  Still mildly febrile this morning.  Denies any new complaints.   Objective: Vitals:   08/24/19 0559 08/24/19 0820 08/24/19 0836 08/24/19 1428  BP: 116/73   (!) 124/59  Pulse: (!) 118   (!) 120  Resp: 17   14  Temp: (!) 100.5 F (38.1 C) 98.3 F (36.8 C)  (!) 100.7 F (38.2 C)  TempSrc: Oral Oral  Oral  SpO2: 95%  97% 100%  Weight:      Height:        Intake/Output Summary (Last 24 hours) at  08/24/2019 1511 Last data filed at 08/24/2019 1400 Gross per 24 hour  Intake 1759.83 ml  Output 3000 ml  Net -1240.17 ml   Filed Weights   08/18/19 0107 08/18/19 0400 08/20/19 0414  Weight: 72.4 kg 72.4 kg 68.9 kg    Examination:  General exam: Deconditioned, debilitated, thin built  HEENT:PERRL,Oral mucosa moist, Ear/Nose normal on gross exam Respiratory system: Bilateral decreased air entry on the bases Cardiovascular system: S1 & S2 heard, RRR. No JVD, murmurs, rubs, gallops or clicks. Trace pedal edema. Gastrointestinal system: Abdomen is distended, soft and nontender.  Normal bowel sounds heard. Central nervous system: Alert and oriented. No focal neurological deficits. Extremities:Trace bilateral lower extremity edema, no clubbing ,no cyanosis, distal peripheral pulses palpable. Skin: Tattos.No rashes, lesions or ulcers,no icterus ,no pallor Psychiatry: Judgement and insight appear normal. Mood & affect appropriate.     Data Reviewed: I have personally reviewed following labs and imaging studies  CBC: Recent Labs  Lab 08/20/19 0216 08/21/19 0539 08/22/19 0531 08/23/19 0502 08/24/19 0545  WBC 4.2 4.7 5.2 4.9 5.2  NEUTROABS 1.2* 1.5* 1.6* 1.2* 1.4*  HGB 7.8* 7.4* 8.1* 7.3* 7.9*  HCT 23.9* 22.6* 25.0* 22.8* 23.9*  MCV 85.7 84.3 86.2 86.4 86.6  PLT 38* 38* 42* 46* 46*   Basic Metabolic Panel: Recent Labs  Lab 08/20/19 0216 08/21/19 0539 08/22/19 0531 08/23/19 0502 08/24/19 0545  NA 131* 134* 132* 133* 134*  K 3.5 3.8 3.8 3.1* 3.7  CL 96* 97* 93* 95* 97*  CO2 21* _0 GLUCOSE 107* 80 89 127* 90  BUN 13 14 25* 25* 27*  CREATININE 0.63 0.61 0.71 0.72 0.74  CALCIUM 8.0* 8.7* 8.7* 8.3* 8.5*  MG 1.7 1.5* 1.5* 1.7 1.9  PHOS 3.0 3.4 3.5 3.0 2.9   GFR: Estimated Creatinine Clearance: 100.5 mL/min (by C-G formula based on SCr of 0.74 mg/dL). Liver Function Tests: Recent Labs  Lab 08/20/19 0216 08/21/19 0539 08/22/19 0531 08/23/19 0502  08/24/19 0545  AST 67* 61* 53* 50* 68*  ALT _1 ALKPHOS 153* 175* 175* 174* 257*  BILITOT 1.0 0.8 1.1 0.7 1.0  PROT 4.3* 4.4* 4.5* 4.3* 4.6*  ALBUMIN 1.8* 1.8* 2.0* 1.7* 1.9*   No results for input(s): LIPASE, AMYLASE in the last 168 hours. No results for input(s): AMMONIA in the last 168 hours. Coagulation Profile: Recent Labs  Lab 08/18/19 1314  INR 1.3*   Cardiac Enzymes: No results for input(s): CKTOTAL, CKMB, CKMBINDEX, TROPONINI in the last 168 hours. BNP (last 3 results) No results for input(s): PROBNP in the last 8760 hours. HbA1C: No results for input(s): HGBA1C in the last 72 hours. CBG: No results for input(s): GLUCAP in the last 168 hours. Lipid Profile: No results for input(s): CHOL, HDL, LDLCALC, TRIG, CHOLHDL, LDLDIRECT in the last 72 hours. Thyroid Function Tests: No results for input(s): TSH, T4TOTAL, FREET4, T3FREE, THYROIDAB in the last 72 hours. Anemia Panel: No results for input(s): VITAMINB12, FOLATE, FERRITIN, TIBC, IRON, RETICCTPCT in the last 72 hours. Sepsis Labs: Recent Labs  Lab 08/18/19 0134 08/18/19 0437 08/18/19 0749 08/18/19 1032 08/19/19 0235 08/20/19 0216  PROCALCITON  --   --  2.60  --  3.00 2.31  LATICACIDVEN 6.0* 4.6* 3.0* 2.9*  --   --     Recent Results (from the past 240 hour(s))  Culture, Urine     Status: None   Collection Time: 08/17/19  7:14 PM   Specimen: Urine, Clean Catch  Result Value Ref Range Status   Specimen Description   Final    URINE, CLEAN CATCH Performed at [facility], [place] [address], [place], [place] [address]    Special Requests   Final    NONE Performed at [facility], [place] [address], [place], [place] [address]    Culture   Final    NO GROWTH Performed at [facility], [place] [address] [place], [place] [address]    Report Status 08/19/2019 FINAL  Final  SARS CORONAVIRUS 2 (TAT 6-24 HRS) Nasopharyngeal Nasopharyngeal Swab     Status: None    Collection Time: 08/17/19  8:52 PM   Specimen: Nasopharyngeal Swab  Result Value Ref Range Status   SARS Coronavirus 2 NEGATIVE NEGATIVE Final    Comment: (NOTE) SARS-CoV-2 target nucleic acids are NOT DETECTED. The SARS-CoV-2 RNA is generally detectable in upper and lower respiratory specimens during the acute phase of infection. Negative results do not preclude SARS-CoV-2 infection, do not rule out co-infections with other pathogens, and should not be used as the sole basis for treatment or other patient management decisions. Negative results must be combined with clinical observations, patient history, and epidemiological information. The expected result is Negative. Fact Sheet for Patients: SugarRoll.be Fact Sheet for Healthcare Providers: https://www.woods-mathews.com/ This test is not yet approved or cleared by the [place] FDA and  has been authorized for detection and/or diagnosis of SARS-CoV-2 by FDA under an Emergency Use Authorization (EUA). This EUA will remain  in effect (meaning this test can be used) for the duration of the COVID-19 declaration under Section 56 4(b)(1) of the Act, 21 U.S.C. section 360bbb-3(b)(1), unless the authorization is terminated or revoked sooner. Performed at [facility], [place] [address] [place], [place] [address]   Blood Culture (routine x 2)     Status: Abnormal   Collection Time: 08/17/19  8:55 PM   Specimen: BLOOD  Result Value Ref Range Status   Specimen Description   Final    BLOOD LEFT ANTECUBITAL Performed at [place] [address] [place], [place] [address]    Special Requests   Final    BOTTLES DRAWN AEROBIC AND ANAEROBIC Blood Culture adequate volume Performed at [place] [address] [place], [place] [address]    Culture  Setup Time   Final    GRAM POSITIVE COCCI AEROBIC BOTTLE ONLY CRITICAL RESULT CALLED TO, READ  BACK BY AND VERIFIED WITH: J. Winchester, PHARMD ([facility]) AT 3785 ON 08/18/19 BY C. JESSUP, MT.    Culture (A)  Final    STAPHYLOCOCCUS SPECIES (COAGULASE NEGATIVE) THE SIGNIFICANCE OF ISOLATING THIS ORGANISM FROM A SINGLE SET OF BLOOD CULTURES WHEN MULTIPLE SETS ARE DRAWN IS UNCERTAIN. PLEASE NOTIFY THE MICROBIOLOGY DEPARTMENT WITHIN ONE WEEK IF SPECIATION AND SENSITIVITIES ARE REQUIRED. Performed at [facility], [place] [address] [place], [place] [address]    Report Status 08/19/2019 FINAL  Final  Blood Culture (routine x 2)     Status: None   Collection Time: 08/17/19  8:55 PM   Specimen: BLOOD  Result Value Ref Range Status   Specimen Description   Final    BLOOD RIGHT ANTECUBITAL Performed at [place] [address], [place], [place] [address]    Special Requests   Final    BOTTLES DRAWN AEROBIC AND ANAEROBIC Blood Culture results may not be optimal due to an excessive volume of blood received in culture bottles Performed at [place] [address], [place], [place] [address]    Culture   Final    NO GROWTH 5 DAYS Performed at [facility], [place] [address] [place], [place] [address]    Report Status 08/22/2019 FINAL  Final  MRSA PCR Screening     Status: None   Collection Time: 08/18/19 12:22 AM   Specimen: Nasal Mucosa; Nasopharyngeal  Result Value Ref Range Status   MRSA by PCR NEGATIVE NEGATIVE Final    Comment:        The GeneXpert MRSA Assay (FDA approved for NASAL specimens only), is one component of a comprehensive MRSA colonization surveillance program. It is not intended to diagnose MRSA infection nor to guide or monitor treatment for MRSA infections. Performed at [facility], [place] [address], [place], [place] [address]   Culture, blood (routine x 2)     Status: None   Collection Time: 08/19/19  5:48 PM   Specimen: BLOOD RIGHT HAND  Result Value Ref Range Status   Specimen Description    Final    BLOOD RIGHT HAND Performed at [facility], [place] [address], [place], [place] [address]    Special Requests   Final    BOTTLES DRAWN AEROBIC ONLY Blood Culture adequate volume Performed at [place] [address], [place],  [address]  Culture   Final    NO GROWTH 5 DAYS Performed at [facility], [place] [address], [place], [place] [address]    Report Status 08/24/2019 FINAL  Final  Culture, blood (routine x 2)     Status: None   Collection Time: 08/19/19  5:48 PM   Specimen: BLOOD LEFT HAND  Result Value Ref Range Status   Specimen Description   Final    BLOOD LEFT HAND Performed at [facility], [place] [address], [place], [place] [address]    Special Requests   Final    BOTTLES DRAWN AEROBIC ONLY Blood Culture adequate volume Performed at [place] [address], [place], [place] [address]    Culture   Final    NO GROWTH 5 DAYS Performed at [facility], [place] [address] [place], [place] [address]    Report Status 08/24/2019 FINAL  Final  Body fluid culture     Status: None (Preliminary result)   Collection Time: 08/22/19  4:00 PM   Specimen: PATH Cytology Pleural fluid  Result Value Ref Range Status   Specimen Description   Final    PLEURAL Performed at [place] [address] [place], [place] [address]    Special Requests   Final    NONE Performed at [facility], [place] [address] [place],  [address]    Gram Stain   Final    ABUNDANT WBC PRESENT, PREDOMINANTLY MONONUCLEAR NO ORGANISMS SEEN    Culture   Final    NO GROWTH 2 DAYS Performed at [place] [address], [place],  [address]    Report Status PENDING  Incomplete         Radiology Studies: Dg Chest 1 View  Result Date: 08/22/2019 CLINICAL DATA:  Status post thoracentesis. EXAM: CHEST  1 VIEW COMPARISON:  Earlier today at 0400 hours. FINDINGS:  1550 hours. Patient rotated left. Midline trachea. Normal heart size. Interval resolution of left-sided pleural fluid. No right-sided effusion. No pneumothorax. Skin fold projects over the left lower chest. Significantly improved left base airspace disease. Right infrahilar atelectasis remains. Again identified is a right midlung 7 mm nodule IMPRESSION: Interval left thoracentesis, without pneumothorax. Resolution of left lower lobe airspace disease, with mild right infrahilar atelectasis remaining. Suspicion of a 7 mm right midlung nodule, as before. Electronically Signed   By: Abigail Miyamoto M.D.   On: 08/22/2019 16:17   Dg Chest Port 1 View  Result Date: 08/23/2019 CLINICAL DATA:  Shortness of breath EXAM: PORTABLE CHEST 1 VIEW COMPARISON:  Radiograph 08/22/2019 FINDINGS: Mild left anterior obliquity. Redemonstration of a 9 mm nodule in the right mid lung, seen on comparison CT. Stable bandlike areas of atelectasis and/or scarring. Additional hazy basilar atelectatic changes are present. No significant reaccumulation of the left effusion. No right effusion. Cardiomediastinal contours are stable. No acute osseous or soft tissue abnormality. IMPRESSION: 1. Unchanged 9 mm nodule in the right mid lung, seen on comparison CT. 2. No significant reaccumulation of the left effusion post thoracentesis. 3. Stable bandlike areas of atelectasis and/or scarring. Electronically Signed   By: Lovena Le M.D.   On: 08/23/2019 06:00   US Thoracentesis Asp Pleural Space W/img Guide  Result Date: 08/24/2019 INDICATION: Patient with shortness of breath, left pleural effusion. Request made for diagnostic and therapeutic left thoracentesis. EXAM: ULTRASOUND GUIDED DIAGNOSTIC AND THERAPEUTIC LEFT THORACENTESIS MEDICATIONS: 10 mL 1% lidocaine COMPLICATIONS: None immediate. PROCEDURE: An  ultrasound guided thoracentesis was thoroughly discussed with the patient and questions answered. The benefits, risks, alternatives and  complications were also discussed. The patient understands and wishes to proceed with the procedure. Written consent was obtained. Ultrasound was performed to localize and mark an adequate pocket of fluid in the left chest. The area was then prepped and draped in the normal sterile fashion. 1% Lidocaine was used for local anesthesia. Under ultrasound guidance a 6 Fr Safe-T-Centesis catheter was introduced. Thoracentesis was performed. The catheter was removed and a dressing applied. FINDINGS: A total of approximately 550 mL of yellow fluid was removed. Samples were sent to the laboratory as requested by the clinical team. IMPRESSION: Successful ultrasound guided left thoracentesis yielding 550 mL of pleural fluid. Read by: Brynda Greathouse PA-C Electronically Signed   By: Jerilynn Mages.  Shick M.D.   On: 08/22/2019 16:59        Scheduled Meds:  sodium chloride   Intravenous Once   Chlorhexidine Gluconate Cloth  6 each Topical Daily   feeding supplement (ENSURE ENLIVE)  237 mL Oral BID BM   feeding supplement (PRO-STAT SUGAR FREE 64)  30 mL Oral BID   folic acid  1 mg Oral Daily   furosemide  40 mg Intravenous BID   hydrocortisone   Rectal TID   ipratropium  0.5 mg Nebulization BID   levalbuterol  0.63 mg Nebulization BID   mouth rinse  15 mL Mouth Rinse BID   multivitamin with minerals  1 tablet Oral Daily   nicotine  7 mg Transdermal Daily   nystatin  5 mL Mouth/Throat QID   pantoprazole  20 mg Oral Daily   potassium chloride  40 mEq Oral BID   Continuous Infusions:  sodium chloride       LOS: 7 days    Time spent: 35 mins.More than 50% of that time was spent in counseling and/or coordination of care.      Shelly Coss, MD [facility] Pager [phone]  If 7PM-7AM, please contact night-coverage [email or web address] Password [email or web address] 08/24/2019, 3:11 PM

## 2019-08-25 ENCOUNTER — Inpatient Hospital Stay (HOSPITAL_COMMUNITY): Payer: Medicaid Other

## 2019-08-25 DIAGNOSIS — C8590 Non-Hodgkin lymphoma, unspecified, unspecified site: Secondary | ICD-10-CM

## 2019-08-25 LAB — TYPE AND SCREEN
ABO/RH(D): O POS
Antibody Screen: NEGATIVE
Unit division: 0
Unit division: 0

## 2019-08-25 LAB — CBC WITH DIFFERENTIAL/PLATELET
Abs Immature Granulocytes: 0.04 10*3/uL (ref 0.00–0.07)
Basophils Absolute: 0 10*3/uL (ref 0.0–0.1)
Basophils Relative: 0 %
Eosinophils Absolute: 0 10*3/uL (ref 0.0–0.5)
Eosinophils Relative: 0 %
HCT: 23.6 % — ABNORMAL LOW (ref 39.0–52.0)
Hemoglobin: 7.5 g/dL — ABNORMAL LOW (ref 13.0–17.0)
Immature Granulocytes: 1 %
Lymphocytes Relative: 53 %
Lymphs Abs: 2.7 10*3/uL (ref 0.7–4.0)
MCH: 28 pg (ref 26.0–34.0)
MCHC: 31.8 g/dL (ref 30.0–36.0)
MCV: 88.1 fL (ref 80.0–100.0)
Monocytes Absolute: 0.6 10*3/uL (ref 0.1–1.0)
Monocytes Relative: 13 %
Neutro Abs: 1.7 10*3/uL (ref 1.7–7.7)
Neutrophils Relative %: 33 %
Platelets: 53 10*3/uL — ABNORMAL LOW (ref 150–400)
RBC: 2.68 MIL/uL — ABNORMAL LOW (ref 4.22–5.81)
RDW: 21.4 % — ABNORMAL HIGH (ref 11.5–15.5)
WBC: 5 10*3/uL (ref 4.0–10.5)
nRBC: 0 % (ref 0.0–0.2)

## 2019-08-25 LAB — COMPREHENSIVE METABOLIC PANEL
ALT: 26 U/L (ref 0–44)
AST: 61 U/L — ABNORMAL HIGH (ref 15–41)
Albumin: 1.8 g/dL — ABNORMAL LOW (ref 3.5–5.0)
Alkaline Phosphatase: 259 U/L — ABNORMAL HIGH (ref 38–126)
Anion gap: 13 (ref 5–15)
BUN: 26 mg/dL — ABNORMAL HIGH (ref 6–20)
CO2: 26 mmol/L (ref 22–32)
Calcium: 9.5 mg/dL (ref 8.9–10.3)
Chloride: 95 mmol/L — ABNORMAL LOW (ref 98–111)
Creatinine, Ser: 0.72 mg/dL (ref 0.61–1.24)
GFR calc Af Amer: >60 mL/min (ref >60)
GFR calc non Af Amer: >60 mL/min (ref >60)
Glucose, Bld: 88 mg/dL (ref 70–99)
Potassium: 4.4 mmol/L (ref 3.5–5.1)
Sodium: 134 mmol/L — ABNORMAL LOW (ref 135–145)
Total Bilirubin: 0.8 mg/dL (ref 0.3–1.2)
Total Protein: 4.6 g/dL — ABNORMAL LOW (ref 6.5–8.1)

## 2019-08-25 LAB — BPAM RBC
Blood Product Expiration Date: 202012252359
Blood Product Expiration Date: 202012272359
ISSUE DATE / TIME: 202011220120
ISSUE DATE / TIME: 202011232254
Unit Type and Rh: 5100
Unit Type and Rh: 5100

## 2019-08-25 LAB — HEPATITIS B SURFACE ANTIGEN: Hepatitis B Surface Ag: NONREACTIVE

## 2019-08-25 LAB — HEPATITIS B CORE ANTIBODY, TOTAL: Hep B Core Total Ab: NONREACTIVE

## 2019-08-25 MED ORDER — FUROSEMIDE 10 MG/ML IJ SOLN
40.0000 mg | Freq: Every day | INTRAMUSCULAR | Status: DC
  Administered 2019-08-26 – 2019-08-30 (×5): 40 mg via INTRAVENOUS
  Filled 2019-08-25 (×5): qty 4

## 2019-08-25 MED ORDER — ACETAMINOPHEN 325 MG PO TABS
650.0000 mg | ORAL_TABLET | Freq: Once | ORAL | Status: AC
  Administered 2019-08-25: 650 mg via ORAL
  Filled 2019-08-25: qty 2

## 2019-08-25 MED ORDER — ALLOPURINOL 300 MG PO TABS
300.0000 mg | ORAL_TABLET | Freq: Every day | ORAL | Status: DC
  Administered 2019-08-25 – 2019-09-06 (×13): 300 mg via ORAL
  Filled 2019-08-25 (×2): qty 1
  Filled 2019-08-25: qty 3
  Filled 2019-08-25 (×6): qty 1
  Filled 2019-08-25 (×4): qty 3

## 2019-08-25 MED ORDER — SODIUM CHLORIDE 0.9 % IV SOLN
375.0000 mg/m2 | Freq: Once | INTRAVENOUS | Status: AC
  Administered 2019-08-25: 700 mg via INTRAVENOUS
  Filled 2019-08-25: qty 50

## 2019-08-25 MED ORDER — SODIUM CHLORIDE 0.9 % IV SOLN
Freq: Once | INTRAVENOUS | Status: AC
  Administered 2019-08-31: 18:00:00 via INTRAVENOUS

## 2019-08-25 MED ORDER — SODIUM CHLORIDE 0.9 % IV SOLN
INTRAVENOUS | Status: DC
  Administered 2019-08-25: 11:00:00 via INTRAVENOUS

## 2019-08-25 MED ORDER — CHLORHEXIDINE GLUCONATE CLOTH 2 % EX PADS
6.0000 | MEDICATED_PAD | Freq: Every day | CUTANEOUS | Status: DC
  Administered 2019-08-26 – 2019-09-06 (×8): 6 via TOPICAL

## 2019-08-25 MED ORDER — PROCHLORPERAZINE MALEATE 10 MG PO TABS
10.0000 mg | ORAL_TABLET | Freq: Four times a day (QID) | ORAL | Status: DC | PRN
  Administered 2019-08-25: 10 mg via ORAL
  Filled 2019-08-25: qty 1

## 2019-08-25 MED ORDER — DIPHENHYDRAMINE HCL 50 MG PO CAPS
50.0000 mg | ORAL_CAPSULE | Freq: Once | ORAL | Status: AC
  Administered 2019-08-25: 50 mg via ORAL
  Filled 2019-08-25: qty 1

## 2019-08-25 MED ORDER — FAMOTIDINE IN NACL 20-0.9 MG/50ML-% IV SOLN
20.0000 mg | Freq: Once | INTRAVENOUS | Status: AC
  Administered 2019-08-25: 20 mg via INTRAVENOUS
  Filled 2019-08-25: qty 50

## 2019-08-25 MED ORDER — LORAZEPAM 2 MG/ML IJ SOLN
INTRAMUSCULAR | Status: AC
  Filled 2019-08-25: qty 1

## 2019-08-25 MED ORDER — METHYLPREDNISOLONE SODIUM SUCC 125 MG IJ SOLR
125.0000 mg | Freq: Once | INTRAMUSCULAR | Status: AC
  Administered 2019-08-25: 125 mg via INTRAVENOUS
  Filled 2019-08-25: qty 2

## 2019-08-25 MED ORDER — LORAZEPAM 2 MG/ML IJ SOLN
1.0000 mg | Freq: Once | INTRAMUSCULAR | Status: AC
  Administered 2019-08-25: 1 mg via INTRAVENOUS

## 2019-08-25 MED ORDER — IOHEXOL 350 MG/ML SOLN
100.0000 mL | Freq: Once | INTRAVENOUS | Status: AC | PRN
  Administered 2019-08-25: 100 mL via INTRAVENOUS

## 2019-08-25 NOTE — Progress Notes (Signed)
   08/25/19 1828  MEWS Score  Resp (!) 28  Pulse Rate (!) 135  BP (!) 112/55  Temp (!) 102.7 F (39.3 C)  SpO2 91 %  O2 Device Room Air  MEWS Score  MEWS RR 2  MEWS Pulse 3  MEWS Systolic 0  MEWS LOC 0  MEWS Temp 2  MEWS Score 7  MEWS Score Color Red  MEWS Assessment  Is this an acute change? Yes  MEWS guidelines implemented *See Row Information* Red  Rapid Response Notification  Name of Rapid Response RN Notified Z. Suggs,RN  Date Rapid Response Notified 08/25/19  Time Rapid Response Notified [place]  Provider Notification  Provider Name/Title Dr. Tawanna Solo  Date Provider Notified 08/26/19  Time Provider Notified 1844  Notification Type Call  Notification Reason Change in status  Response See new orders (ativan for anxiety)  Date of Provider Response 08/25/19  Time of Provider Response [phone]

## 2019-08-25 NOTE — Progress Notes (Signed)
RN called to room by patient c/o difficulty breathing. Rituxan infusion stopped and Rapid Response team notified.

## 2019-08-25 NOTE — Progress Notes (Signed)
Report received from Wilmer Floor, RN.  Pt placed on cardiac monitor. Heart rate 130-140.  Pt extremely anxious. Will continue to monitor. Stacey Drain

## 2019-08-25 NOTE — Progress Notes (Signed)
Report called to Roanna Banning. Stacey Drain

## 2019-08-25 NOTE — Progress Notes (Signed)
PROGRESS NOTE    Isaac Pierce  MVH:[id number] DOB: 11-27-1960 DOA: 08/17/2019 PCP: Patient, No Pcp Per   Brief Narrative:  Patient is a 58 year old male with history of chronic back pain, GERD, anxiety, homelessness who initially presented with shortness of breath.  He complained of generalized weakness, loss of appetite, shortness of breath, productive cough, recent weight loss, leg swelling and distended abdomen when he presented.  He also noticed lumps underneath his axilla and neck.  He has family history of lymphoma.  He was smoking 1 to 2 packs of cigarettes a day.  When he presented he was tachycardic.  Chest x-ray showed mild vascular congestion, small bilateral effusion.  CT angiogram was negative for PE but showed bulky mediastinal, bilateral hilar, bilateral axillary lymphadenopathy.  Findings are concerning for primary lymphoproliferative process such as lymphoma.  Also found to have noncalcified pulmonary nodules measuring up to 10 mm and also hepatosplenomegaly.  He was also anemic on presentation so he was transfused with PRBCs. Total of 5 units of PRBCs during this hospitalization.    Oncology consulted.  Hospital course remarkable for respiratory distress which improved with Lasix.  He underwent biopsy of the right inguinal lymph node and bone marrow biopsy. He is persistently febrile due to lymphoma.  Also underwent thoracentesis for left-sided pleural effusion.  Bone marrow biopsy report showed atypical lymphocytes suspicious for lymphoproliferative disease.  Oncology plan to start on Rituxan  for lymphoma.  Assessment & Plan:   Principal Problem:   Severe sepsis ([place]) Active Problems:   Malignancy (HCC)   Pancytopenia (HCC)   Dyspnea   Pleural effusion   Hyponatremia   Protein-calorie malnutrition, severe   Non-Hodgkin lymphoma (HCC)   Lymphoproliferative disorder/lymphoma: Presented with 5-day history of dyspnea on exertion, nausea, significant weight loss, diffuse  lymphadenopathy.  CT angiogram showed bulky mediastinal, bilateral hilar, bilateral axillary lymphadenopathy.  Also found to have pulmonary nodules, hepatosplenomegaly.  Underwent CT abdomen/pelvis for further staging which showed massive splenomegaly with abdominal and inguinal lymphadenopathy.  Underwent ultrasound-guided liver biopsy by IR of right inguinal lymph node which showed monoclonal CD5 positive B-cell population.  Also underwent CT-guided aspiration and core biopsy of right iliac bone which  showed atypical lymphocytes suspicious for lymphoproliferative disease. Oncology following and planning to start on Rituxan.  Concern for sepsis/lactic acidosis/fever: Lactic acid was elevated on presentation.  Currently improved.  He is persistently febrile from his lymphoma.  Blood cultures have been negative so far.  Antibiotics have been discontinued.  LA improved.  One of the blood culture set showed gram-positive cocci most likely contaminant.  Chest x-ray does not show any clear pneumonia.  Pancytopenia: Has leukopenia,anemia,thrombocytopenia.  So far transfused with a total of 5 units of PRBC.  Also has thrombocytopenia.  Will transfuse with platelets if platelets drop below 20000.  Continue folic acid.  Avoid anticoagulation.  Dyspnea/left-sided pleural effusion: Underwent thoracentesis by IR with removal of 550 mL of yellow fluid.  Currently does not complain of any shortness of breath.  Saturating fine on room air.  Hyponatremia: Improved  Bilateral lower extremity edema/anasarca/concern for DVT: Significantly improved with Lasix.  Anasarca secondary to low albumin from  Malignancy,poor appetite.  D-dimer was elevated at 5.  CT angiogram negative for PE.  Doppler did not show any DVT.On lasix 40 mg IV which we will continue for now.Oncology started on gentle IV fluid for starting rituxan. Echocardiogram showed normal ejection fraction.  Tobacco abuse: Counseled cessation.  Continue nicotine  patch.  GERD: Continue  PPI  Elevated liver enzymes: Due to abdominal lymphadenopathy.  Continue to monitor.  Abdominal distention: Secondary to lymphadenopathy, hepatosplenomegaly,ascites.  Denies any abdominal pain today.  Severe protein calorie malnutrition: Dietitian following.        Nutrition Problem: Severe Malnutrition Etiology: chronic illness(concern for lymphoma)      DVT prophylaxis:SCD Code Status: Full Family Communication: None present at the bedside.Patient has been talking to family Disposition Plan: Likely home after full work-up,oncology clearance   Consultants: Oncology, PCCM, IR  Procedures: Lymph node biopsy, bone marrow biopsy, thoracentesis  Antimicrobials:  Anti-infectives (From admission, onward)   Start     Dose/Rate Route Frequency Ordered Stop   08/18/19 2200  ceFEPIme (MAXIPIME) 2 g in sodium chloride 0.9 % 100 mL IVPB     2 g 200 mL/hr over 30 Minutes Intravenous Every 8 hours 08/18/19 1534 08/23/19 2255   08/18/19 1200  vancomycin (VANCOCIN) 1,250 mg in sodium chloride 0.9 % 250 mL IVPB  Status:  Discontinued     1,250 mg 166.7 mL/hr over 90 Minutes Intravenous Every 12 hours 08/18/19 0621 08/19/19 1733   08/18/19 0600  piperacillin-tazobactam (ZOSYN) IVPB 3.375 g     3.375 g 12.5 mL/hr over 240 Minutes Intravenous Every 8 hours 08/17/19 2251 08/18/19 1800   08/17/19 2300  vancomycin (VANCOCIN) 1,500 mg in sodium chloride 0.9 % 500 mL IVPB     1,500 mg 250 mL/hr over 120 Minutes Intravenous  Once 08/17/19 2250 08/18/19 0332   08/17/19 2300  piperacillin-tazobactam (ZOSYN) IVPB 3.375 g     3.375 g 100 mL/hr over 30 Minutes Intravenous  Once 08/17/19 2250 08/18/19 0131      Subjective:  Patient seen and examined the bedside this morning.  Hemodynamically stable.  Looks comfortable.  Was eating his breakfast when I saw him.  He said he felt very good after his left good hours last night.  He says his appetite is coming back.  He was  little unhappy about the plan to start on Rituxan because he was just starting to eat and he was concerned about lack of appetite after he was given Rituxan.   Objective: Vitals:   08/25/19 0500 08/25/19 0651 08/25/19 0838 08/25/19 0840  BP:  118/68    Pulse:  (!) 119    Resp:  18    Temp:  99.5 F (37.5 C) 99.5 F (37.5 C)   TempSrc:  Oral Oral   SpO2:  91%  95%  Weight: 66.7 kg     Height:        Intake/Output Summary (Last 24 hours) at 08/25/2019 1144 Last data filed at 08/25/2019 0650 Gross per 24 hour  Intake 960 ml  Output 3350 ml  Net -2390 ml   Filed Weights   08/18/19 0400 08/20/19 0414 08/25/19 0500  Weight: 72.4 kg 68.9 kg 66.7 kg    Examination:   General exam: Deconditioned, debilitated, thin, chronically ill looking HEENT:PERRL,Oral mucosa moist, Ear/Nose normal on gross exam Respiratory system: Decreased air entry on the left side. Cardiovascular system: S1 & S2 heard, RRR. No JVD, murmurs, rubs, gallops or clicks. Gastrointestinal system: Abdomen is distended, soft and nontender. . Normal bowel sounds heard. Central nervous system: Alert and oriented. No focal neurological deficits. Extremities:1-2 + bilateral lower extremity edema, no clubbing ,no cyanosis Skin: No rashes, lesions or ulcers,no icterus ,no pallor     Data Reviewed: I have personally reviewed following labs and imaging studies  CBC: Recent Labs  Lab 08/21/19 0539 08/22/19  7915 08/23/19 0502 08/24/19 0545 08/25/19 0756  WBC 4.7 5.2 4.9 5.2 5.0  NEUTROABS 1.5* 1.6* 1.2* 1.4* 1.7  HGB 7.4* 8.1* 7.3* 7.9* 7.5*  HCT 22.6* 25.0* 22.8* 23.9* 23.6*  MCV 84.3 86.2 86.4 86.6 88.1  PLT 38* 42* 46* 46* 53*   Basic Metabolic Panel: Recent Labs  Lab 08/20/19 0216 08/21/19 0539 08/22/19 0531 08/23/19 0502 08/24/19 0545 08/25/19 0530  NA 131* 134* 132* 133* 134* 134*  K 3.5 3.8 3.8 3.1* 3.7 4.4  CL 96* 97* 93* 95* 97* 95*  CO2 21* _0 GLUCOSE 107* 80 89 127* 90 88    BUN 13 14 25* 25* 27* 26*  CREATININE 0.63 0.61 0.71 0.72 0.74 0.72  CALCIUM 8.0* 8.7* 8.7* 8.3* 8.5* 9.5  MG 1.7 1.5* 1.5* 1.7 1.9  --   PHOS 3.0 3.4 3.5 3.0 2.9  --    GFR: Estimated Creatinine Clearance: 97.3 mL/min (by C-G formula based on SCr of 0.72 mg/dL). Liver Function Tests: Recent Labs  Lab 08/21/19 0539 08/22/19 0531 08/23/19 0502 08/24/19 0545 08/25/19 0530  AST 61* 53* 50* 68* 61*  ALT _1 ALKPHOS 175* 175* 174* 257* 259*  BILITOT 0.8 1.1 0.7 1.0 0.8  PROT 4.4* 4.5* 4.3* 4.6* 4.6*  ALBUMIN 1.8* 2.0* 1.7* 1.9* 1.8*   No results for input(s): LIPASE, AMYLASE in the last 168 hours. No results for input(s): AMMONIA in the last 168 hours. Coagulation Profile: Recent Labs  Lab 08/18/19 1314  INR 1.3*   Cardiac Enzymes: No results for input(s): CKTOTAL, CKMB, CKMBINDEX, TROPONINI in the last 168 hours. BNP (last 3 results) No results for input(s): PROBNP in the last 8760 hours. HbA1C: No results for input(s): HGBA1C in the last 72 hours. CBG: No results for input(s): GLUCAP in the last 168 hours. Lipid Profile: No results for input(s): CHOL, HDL, LDLCALC, TRIG, CHOLHDL, LDLDIRECT in the last 72 hours. Thyroid Function Tests: No results for input(s): TSH, T4TOTAL, FREET4, T3FREE, THYROIDAB in the last 72 hours. Anemia Panel: No results for input(s): VITAMINB12, FOLATE, FERRITIN, TIBC, IRON, RETICCTPCT in the last 72 hours. Sepsis Labs: Recent Labs  Lab 08/19/19 0235 08/20/19 0216  PROCALCITON 3.00 2.31    Recent Results (from the past 240 hour(s))  Culture, Urine     Status: None   Collection Time: 08/17/19  7:14 PM   Specimen: Urine, Clean Catch  Result Value Ref Range Status   Specimen Description   Final    URINE, CLEAN CATCH Performed at [facility], [place] [address], [place], [place] [address]    Special Requests   Final    NONE Performed at [facility], [place] [address], [place], [place]  [address]    Culture   Final    NO GROWTH Performed at [facility], [place] [address], [place], [place] [address]    Report Status 08/19/2019 FINAL  Final  SARS CORONAVIRUS 2 (TAT 6-24 HRS) Nasopharyngeal Nasopharyngeal Swab     Status: None   Collection Time: 08/17/19  8:52 PM   Specimen: Nasopharyngeal Swab  Result Value Ref Range Status   SARS Coronavirus 2 NEGATIVE NEGATIVE Final    Comment: (NOTE) SARS-CoV-2 target nucleic acids are NOT DETECTED. The SARS-CoV-2 RNA is generally detectable in upper and lower respiratory specimens during the acute phase of infection. Negative results do not preclude SARS-CoV-2 infection, do not rule out co-infections with other pathogens, and should not  be used as the sole basis for treatment or other patient management decisions. Negative results must be combined with clinical observations, patient history, and epidemiological information. The expected result is Negative. Fact Sheet for Patients: SugarRoll.be Fact Sheet for Healthcare Providers: https://www.woods-mathews.com/ This test is not yet approved or cleared by the [place] FDA and  has been authorized for detection and/or diagnosis of SARS-CoV-2 by FDA under an Emergency Use Authorization (EUA). This EUA will remain  in effect (meaning this test can be used) for the duration of the COVID-19 declaration under Section 56 4(b)(1) of the Act, 21 U.S.C. section 360bbb-3(b)(1), unless the authorization is terminated or revoked sooner. Performed at [facility], [place] [address] [place], [place] [address]   Blood Culture (routine x 2)     Status: Abnormal   Collection Time: 08/17/19  8:55 PM   Specimen: BLOOD  Result Value Ref Range Status   Specimen Description   Final    BLOOD LEFT ANTECUBITAL Performed at [facility] [address] [place], [place] [address]    Special Requests   Final    BOTTLES DRAWN  AEROBIC AND ANAEROBIC Blood Culture adequate volume Performed at [facility] [address] [place], [place] [address]    Culture  Setup Time   Final    GRAM POSITIVE COCCI AEROBIC BOTTLE ONLY CRITICAL RESULT CALLED TO, READ BACK BY AND VERIFIED WITH: J. [place], PHARMD ([place]) AT 0174 ON 08/18/19 BY C. JESSUP, MT.    Culture (A)  Final    STAPHYLOCOCCUS SPECIES (COAGULASE NEGATIVE) THE SIGNIFICANCE OF ISOLATING THIS ORGANISM FROM A SINGLE SET OF BLOOD CULTURES WHEN MULTIPLE SETS ARE DRAWN IS UNCERTAIN. PLEASE NOTIFY THE MICROBIOLOGY DEPARTMENT WITHIN ONE WEEK IF SPECIATION AND SENSITIVITIES ARE REQUIRED. Performed at [facility], [place] [address] [place], [place] [address]    Report Status 08/19/2019 FINAL  Final  Blood Culture (routine x 2)     Status: None   Collection Time: 08/17/19  8:55 PM   Specimen: BLOOD  Result Value Ref Range Status   Specimen Description   Final    BLOOD RIGHT ANTECUBITAL Performed at [facility] [address] [place], [place] [address]    Special Requests   Final    BOTTLES DRAWN AEROBIC AND ANAEROBIC Blood Culture results may not be optimal due to an excessive volume of blood received in culture bottles Performed at [facility] [address], [place], [place] [address]    Culture   Final    NO GROWTH 5 DAYS Performed at [facility], [place] [address], [place], [place] [address]    Report Status 08/22/2019 FINAL  Final  MRSA PCR Screening     Status: None   Collection Time: 08/18/19 12:22 AM   Specimen: Nasal Mucosa; Nasopharyngeal  Result Value Ref Range Status   MRSA by PCR NEGATIVE NEGATIVE Final    Comment:        The GeneXpert MRSA Assay (FDA approved for NASAL specimens only), is one component of a comprehensive MRSA colonization surveillance program. It is not intended to diagnose MRSA infection nor to guide or monitor treatment for MRSA infections. Performed at  [facility] [place] [address] [place], [place] [address]   Culture, blood (routine x 2)     Status: None   Collection Time: 08/19/19  5:48 PM   Specimen: BLOOD RIGHT HAND  Result Value Ref Range Status  Specimen Description   Final    BLOOD RIGHT HAND Performed at [facility], [place] [address] [place], [place] [address]    Special Requests   Final    BOTTLES DRAWN AEROBIC ONLY Blood Culture adequate volume Performed at [place] [address], [place], [place] [address]    Culture   Final    NO GROWTH 5 DAYS Performed at [facility], [place] [address], [place], [place] [address]    Report Status 08/24/2019 FINAL  Final  Culture, blood (routine x 2)     Status: None   Collection Time: 08/19/19  5:48 PM   Specimen: BLOOD LEFT HAND  Result Value Ref Range Status   Specimen Description   Final    BLOOD LEFT HAND Performed at [facility], [place] [address], [place], [place] [address]    Special Requests   Final    BOTTLES DRAWN AEROBIC ONLY Blood Culture adequate volume Performed at [facility] [address] [place], [place] [address]    Culture   Final    NO GROWTH 5 DAYS Performed at [facility], [place] [address] [place], [place] [address]    Report Status 08/24/2019 FINAL  Final  Body fluid culture     Status: None (Preliminary result)   Collection Time: 08/22/19  4:00 PM   Specimen: PATH Cytology Pleural fluid  Result Value Ref Range Status   Specimen Description   Final    PLEURAL Performed at [facility] [address], [place], [place] [address]    Special Requests   Final    NONE Performed at [facility], [place] [address] [place], [place] [address]    Gram Stain   Final    ABUNDANT WBC PRESENT, PREDOMINANTLY MONONUCLEAR NO ORGANISMS SEEN    Culture   Final    NO GROWTH 3 DAYS Performed at [facility] [address],  [place],  [address]    Report Status PENDING  Incomplete  Acid Fast Smear (AFB)     Status: None   Collection Time: 08/22/19  4:00 PM   Specimen: PATH Cytology Pleural fluid  Result Value Ref Range Status   AFB Specimen Processing Concentration  Final   Acid Fast Smear Negative  Final    Comment: (NOTE) Performed At: [facility] [place], [place] [id number] Rush Farmer MD KH:[id number]    Source (AFB) PLEURAL  Final    Comment: Performed at [facility], [place] [address] [place],  [address]         Radiology Studies: No results found.      Scheduled Meds:  sodium chloride   Intravenous Once   allopurinol  300 mg Oral Daily   feeding supplement (ENSURE ENLIVE)  237 mL Oral BID BM   feeding supplement (PRO-STAT SUGAR FREE 64)  30 mL Oral BID   folic acid  1 mg Oral Daily   [START ON 08/26/2019] furosemide  40 mg Intravenous Daily   hydrocortisone   Rectal TID   ipratropium  0.5 mg Nebulization BID   levalbuterol  0.63 mg Nebulization BID   mouth rinse  15 mL Mouth Rinse BID   multivitamin with minerals  1 tablet Oral Daily   nicotine  7 mg Transdermal Daily   nystatin  5 mL Mouth/Throat QID   pantoprazole  20 mg Oral Daily   Continuous Infusions:  sodium chloride 50 mL/hr at 08/25/19 1054   sodium chloride       LOS: 8 days    Time spent: 35 mins.More than 50% of that time was spent in counseling and/or coordination of care.      Shelly Coss, MD [facility] Pager [phone]  If 7PM-7AM, please contact night-coverage [email or web address] Password [facility] 08/25/2019, 11:44 AM

## 2019-08-25 NOTE — Progress Notes (Signed)
Patient was transferred via gurney from 4E to CT accompanied by NT and nurse. Patient tolerated CT well and was admitted to room 1232 at approximately 2200.

## 2019-08-25 NOTE — Progress Notes (Signed)
OK to proceed w/ Rituxan w/o HBV results. Ordered lab.  Clinical staff made aware.  Kennith Center, Pharm.D., CPP 08/25/2019@12 :42 PM

## 2019-08-25 NOTE — Progress Notes (Signed)
Attempted to page Oncology MD x2. No return call. Voicemail left for NP as well. Attending notified and called to bedside.

## 2019-08-25 NOTE — Progress Notes (Addendum)
HEMATOLOGY-ONCOLOGY PROGRESS NOTE  SUBJECTIVE: The patient is feeling well this morning.  He is eating and drinking well.  Still reports some abdominal distention.  He denies chest pain, shortness of breath, cough, hemoptysis.  He denies nausea, vomiting, constipation, diarrhea.  Denies bleeding.  Still has lower extremity edema which he feels is stable.  REVIEW OF SYSTEMS:   Constitutional: Denies fevers, chills Respiratory: Denies cough, dyspnea or wheezes Cardiovascular: Denies palpitation, chest discomfort Gastrointestinal: Reports abdominal distention.  Denies nausea, heartburn or change in bowel habits Skin: Denies abnormal skin rashes Lymphatics: Reports ongoing lymphadenopathy in his neck and groin Neurological:Denies numbness, tingling or new weaknesses Behavioral/Psych: Mood is stable, no new changes  Extremities: Reports improved lower extremity edema All other systems were reviewed with the patient and are negative.  I have reviewed the past medical history, past surgical history, social history and family history with the patient and they are unchanged from previous note.   PHYSICAL EXAMINATION: ECOG PERFORMANCE STATUS: 1 - Symptomatic but completely ambulatory  Vitals:   08/25/19 0838 08/25/19 0840  BP:    Pulse:    Resp:    Temp: 99.5 F (37.5 C)   SpO2:  95%   Filed Weights   08/18/19 0400 08/20/19 0414 08/25/19 0500  Weight: 159 lb 9.8 oz (72.4 kg) 151 lb 14.4 oz (68.9 kg) 147 lb (66.7 kg)    Intake/Output from previous day: 11/24 0701 - 11/25 0700 In: 1332 [P.O.:1332] Out: 5000 [Urine:5000]  GENERAL:alert, no distress and comfortable SKIN: skin color, texture, turgor are normal, no rashes or significant lesions EYES: normal, Conjunctiva are pink and non-injected, sclera clear OROPHARYNX:no exudate, no erythema and lips, buccal mucosa, and tongue normal  NECK: supple, thyroid normal size, non-tender, without nodularity LYMPH: Palpable cervical, axillary,  and inguinal lymphadenopathy LUNGS: clear to auscultation and percussion with normal breathing effort HEART: regular rate & rhythm and no murmurs and 1+ bilateral lower extremity pitting edema ABDOMEN: Positive bowel sounds, distended, splenomegaly noted Musculoskeletal:no cyanosis of digits and no clubbing  NEURO: alert & oriented x 3 with fluent speech, no focal motor/sensory deficits  LABORATORY DATA:  I have reviewed the data as listed CMP Latest Ref Rng & Units 08/25/2019 08/24/2019 08/23/2019  Glucose 70 - 99 mg/dL 88 90 127(H)  BUN 6 - 20 mg/dL 26(H) 27(H) 25(H)  Creatinine 0.61 - 1.24 mg/dL 0.72 0.74 0.72  Sodium 135 - 145 mmol/L 134(L) 134(L) 133(L)  Potassium 3.5 - 5.1 mmol/L 4.4 3.7 3.1(L)  Chloride 98 - 111 mmol/L 95(L) 97(L) 95(L)  CO2 22 - 32 mmol/L _0 Calcium 8.9 - 10.3 mg/dL 9.5 8.5(L) 8.3(L)  Total Protein 6.5 - 8.1 g/dL 4.6(L) 4.6(L) 4.3(L)  Total Bilirubin 0.3 - 1.2 mg/dL 0.8 1.0 0.7  Alkaline Phos 38 - 126 U/L 259(H) 257(H) 174(H)  AST 15 - 41 U/L 61(H) 68(H) 50(H)  ALT 0 - 44 U/L _1 Lab Results  Component Value Date   WBC 5.0 08/25/2019   HGB 7.5 (L) 08/25/2019   HCT 23.6 (L) 08/25/2019   MCV 88.1 08/25/2019   PLT 53 (L) 08/25/2019   NEUTROABS 1.7 08/25/2019    Dg Chest 1 View  Result Date: 08/22/2019 CLINICAL DATA:  Status post thoracentesis. EXAM: CHEST  1 VIEW COMPARISON:  Earlier today at 0400 hours. FINDINGS: 1550 hours. Patient rotated left. Midline trachea. Normal heart size. Interval resolution of left-sided pleural fluid. No right-sided effusion. No pneumothorax. Skin fold projects over the left  lower chest. Significantly improved left base airspace disease. Right infrahilar atelectasis remains. Again identified is a right midlung 7 mm nodule IMPRESSION: Interval left thoracentesis, without pneumothorax. Resolution of left lower lobe airspace disease, with mild right infrahilar atelectasis remaining. Suspicion of a 7 mm right midlung  nodule, as before. Electronically Signed   By: Abigail Miyamoto M.D.   On: 08/22/2019 16:17   Dg Chest 2 View  Result Date: 08/17/2019 CLINICAL DATA:  Shortness of breath EXAM: CHEST - 2 VIEW COMPARISON:  None. FINDINGS: Small bilateral pleural effusions. Mild basilar airspace disease. Borderline heart size with mild central vascular congestion. No pneumothorax. IMPRESSION: Mild central vascular congestion with small bilateral effusions. Atelectasis or mild pneumonia at the left greater than right lung base. Electronically Signed   By: Donavan Foil M.D.   On: 08/17/2019 19:57   Dg Abd 1 View  Result Date: 08/21/2019 CLINICAL DATA:  Abdominal swelling and discomfort. EXAM: ABDOMEN - 1 VIEW COMPARISON:  CT, 08/18/2019. FINDINGS: No bowel dilation to suggest obstruction. Bowel is displaced inferiorly by the markedly enlarged spleen. Soft tissues are poorly defined. No evidence of a renal or ureteral stone. There are scattered aortic and iliac artery atherosclerotic calcifications. IMPRESSION: 1. No bowel obstruction. 2. Enlarged spleen. Poorly defined soft tissues consistent with ascites as noted on the prior CT. Electronically Signed   By: Lajean Manes M.D.   On: 08/21/2019 14:20   Ct Angio Chest Pe W/cm &/or Wo Cm  Result Date: 08/17/2019 CLINICAL DATA:  Shortness of breath for 1 month EXAM: CT ANGIOGRAPHY CHEST WITH CONTRAST TECHNIQUE: Multidetector CT imaging of the chest was performed using the standard protocol during bolus administration of intravenous contrast. Multiplanar CT image reconstructions and MIPs were obtained to evaluate the vascular anatomy. CONTRAST:  178m OMNIPAQUE IOHEXOL 350 MG/ML SOLN COMPARISON:  Chest x-ray 08/17/2019 FINDINGS: Cardiovascular: Suboptimal contrast bolus timing and respiratory motion artifact degraded examination of the more distal pulmonary arterial tree. No filling defect within the main or lobar branch pulmonary arteries. No evidence of right heart strain.  Heart size is normal. There is pericardial thickening. Thoracic aorta is normal in course and caliber. Mediastinum/Nodes: Bulky mediastinal, bilateral hilar, and bilateral axillary lymphadenopathy. Thyroid, trachea, and esophagus are grossly unremarkable. Lungs/Pleura: Small bilateral pleural effusions, left greater than right with associated compressive atelectasis. There are numerous bilateral noncalcified pulmonary nodules. Reference nodules include 10 mm right lower lobe juxtapleural nodule (series 6, image 88). 9 mm medial right lower lobe nodule (series 6, image 106). 7 mm anterior right middle lobe nodule (series 6, image 107). 7 mm left lower lobe nodule (series 6, image 90). 8 mm lingular nodule (series 6, image 92). No pneumothorax. Upper Abdomen: Hepatosplenomegaly within the visualized upper abdomen. Ill-defined area of low density within the peripheral aspect of the spleen measuring approximately 3.7 x 2.8 cm (series 4, image 135). Musculoskeletal: No chest wall abnormality. No acute or significant osseous findings. Review of the MIP images confirms the above findings. IMPRESSION: 1. Limited exam. No filling defect to the lobar branch level to suggest pulmonary embolism. 2. Bulky mediastinal, bilateral hilar, and bilateral axillary lymphadenopathy. Findings concerning for primary lymphoproliferative process such as lymphoma. Metastatic disease secondary to unknown primary is also a consideration. 3. Multiple bilateral noncalcified pulmonary nodules measuring up to 10 mm, likely a result of the same process as listed above. 4. Small bilateral pleural effusions, left greater than right with associated compressive atelectasis. 5. Hepatosplenomegaly within the visualized upper abdomen with ill-defined area of  low density within the peripheral aspect of the spleen measuring 3.7 x 2.8 cm. Findings are concerning for lymphomatous involvement versus metastatic disease. Alternatively, sequela of splenic injury  could have a similar appearance. These results were called by telephone at the time of interpretation on 08/17/2019 at 8:49 pm to provider MARGAUX VENTER , who verbally acknowledged these results. Electronically Signed   By: Davina Poke M.D.   On: 08/17/2019 20:51   US Abdomen Complete  Result Date: 08/21/2019 CLINICAL DATA:  Abdominal pain and distention. History of alcohol and drug abuse. EXAM: ABDOMEN ULTRASOUND COMPLETE COMPARISON:  None. FINDINGS: Gallbladder: Small gallstones and sludge. At least 1 polyp noted measuring 3 mm. There is pericholecystic fluid. No sonographic Murphy's sign. Common bile duct: Diameter: 4 mm Liver: Liver appears mildly enlarged. Increased liver parenchymal echogenicity. No mass or focal lesion. Portal vein is patent on color Doppler imaging with normal direction of blood flow towards the liver. IVC: No abnormality visualized. Pancreas: Hypoechoic lesion either adjacent to or within the pancreatic head measuring 2.3 x 1.4 cm. This could reflect a pancreatic mass or adjacent enlarged gastrohepatic ligament lymph node a less well-defined hypoechoic area is seen in the pancreas below this. No pancreatic duct dilation. Spleen: Enlarged spleen measuring 24 x 9 x 23 cm. No splenic mass or focal lesion. Right Kidney: Length: 12.1 cm. Normal parenchymal echogenicity. There are hypo to anechoic cortical lesions that are consistent with cysts. Mild hydronephrosis. No stones. Left Kidney: Length: 12.5 cm. Echogenicity within normal limits. No mass or hydronephrosis visualized. Abdominal aorta: No aneurysm visualized. Other findings: Bilateral pleural effusions. IMPRESSION: 1. No convincing acute finding. There are small gallstones, gallbladder sludge and 1 small polyp as well as pericholecystic fluid, but no sonographic Murphy's sign. Gallbladder is also only mildly to moderately distended. No convincing acute cholecystitis. 2. Hepatosplenomegaly. Increased echogenicity of the liver  consistent with hepatic steatosis. No liver mass. 3. Bilateral pleural effusions. 4. Mild right hydronephrosis. Hypoechoic to anechoic right renal masses that are likely cysts but not fully characterized. Electronically Signed   By: Lajean Manes M.D.   On: 08/21/2019 14:28   Ct Abdomen Pelvis W Contrast  Result Date: 08/18/2019 CLINICAL DATA:  58 year old with pancytopenia and chest lymphadenopathy. Concern for lymphoproliferative disorder. EXAM: CT ABDOMEN AND PELVIS WITH CONTRAST TECHNIQUE: Multidetector CT imaging of the abdomen and pelvis was performed using the standard protocol following bolus administration of intravenous contrast. CONTRAST:  137m OMNIPAQUE IOHEXOL 300 MG/ML  SOLN COMPARISON:  Chest CT 08/17/2019 FINDINGS: Lower chest: Small bilateral pleural effusions. Partial visualization of the right infrahilar lymphadenopathy. There may be markedly enlarged lymph nodes adjacent to the distal esophagus. Small amount of pericardial fluid. Again noted is interstitial thickening in the right lower lobe with posterior consolidation in the right lower lobe. Again noted is a elongated nodular structure in the right lower lobe measuring 9 mm on sequence 6, image 20. Pleural-based nodules in the right lower lobe and right middle lobe are similar to the recent comparison examination. Again noted is a pleural-based nodule in the lingula. Focal pleural thickening along the base of the left major fissure is asimilar to the previous examination. Hepatobiliary: Normal appearance of the liver. No discrete liver lesions identified. Main portal venous system is patent. Gallbladder is decompressed. No significant biliary dilatation. Pancreas: Unremarkable. No pancreatic ductal dilatation or surrounding inflammatory changes. Spleen: Spleen is massive for size measuring 27.0 x 10.6 x 17.4 cm, splenic volume is 2489 mL. Scattered areas of non enhancement  throughout the periphery of the spleen probably represent  infarcts associated with the large size of the spleen. Indeterminate lesion along the left posterior aspect of spleen on sequence 2, image 44 that measures up to 3.8 cm. Small amount of fluid posterior to the spleen near the left hemidiaphragm. Adrenals/Urinary Tract: Adrenal glands are poorly characterized on this examination but no gross abnormality. Compression on the left kidney due to the splenomegaly. Negative for hydronephrosis. Question fullness of the right renal pelvis but this area is poorly characterized. Mild distention of the urinary bladder. High-density material in the urinary bladder could be related to recent chest CT. Stomach/Bowel: Cannot exclude a hiatal hernia. Stomach is compressed by lymph nodes and the splenomegaly. No evidence for acute bowel inflammation or bowel obstruction. Vascular/Lymphatic: Diffuse atherosclerotic calcifications in the abdominal aorta without aneurysm. Ectasias involving the celiac trunk, measuring roughly 1.1 cm. There is probably stenosis involving the proximal SMA but poorly characterized on this examination. Venous structures are unremarkable. Evidence for enlarged lymph nodes in the gastrohepatic ligament region. Enlarged lymph nodes in the periaortic region. Enlarged lymph nodes along the iliac nodal chains. Markedly enlarged lymph nodes in the right groin. There is a index lymph node in the right groin that measures 2.8 x 2.1 cm on sequence 2, image 78. Multiple small lymph nodes in both inguinal regions. Reproductive: Prostate is unremarkable. Other: Small amount of free fluid in the pelvis. There appears to be free fluid in the left lower quadrant below the spleen. Negative for free air. Diffuse subcutaneous edema. Dependent fluid in the paraspinal tissues in the lumbar spine. Musculoskeletal: Disc space narrowing at L5-S1. No suspicious bone lesions. IMPRESSION: 1. Massive splenomegaly with abdominal and inguinal lymphadenopathy. Findings are suggestive for  a lymphoproliferative process such as lymphoma. 2. Evidence for multiple splenic infarcts likely related to the splenomegaly. Cannot exclude a splenic lesion along the left posterior aspect measuring up to 3.8 cm. 3. Evidence for fluid overload state with diffuse subcutaneous edema, bilateral pleural effusions and ascites. 4. Re-demonstration pleural-based nodularity at the lung bases. There is also septal thickening and a nodular structure in the right lower lobe. Findings are compatible with a neoplastic or lymphoproliferative process. 5. No discrete liver lesions. 6. Aortic Atherosclerosis (ICD10-I70.0). Concern for at least mild stenosis involving the proximal SMA. Ectasia of the celiac trunk. Electronically Signed   By: Markus Daft M.D.   On: 08/18/2019 12:59   Ct Biopsy  Result Date: 08/19/2019 INDICATION: 58 year old male with pancytopenia. He presents for bone marrow biopsy as an inpatient. EXAM: CT GUIDED BONE MARROW ASPIRATION AND CORE BIOPSY Interventional Radiologist:  Criselda Peaches, MD MEDICATIONS: None. ANESTHESIA/SEDATION: 2 mg Versed were administered for anxiolysis. This does not constitute moderate sedation. FLUOROSCOPY TIME:  None. COMPLICATIONS: None immediate. Estimated blood loss: <25 mL PROCEDURE: Informed written consent was obtained from the patient after a thorough discussion of the procedural risks, benefits and alternatives. All questions were addressed. Maximal Sterile Barrier Technique was utilized including caps, mask, sterile gowns, sterile gloves, sterile drape, hand hygiene and skin antiseptic. A timeout was performed prior to the initiation of the procedure. The patient was positioned prone and non-contrast localization CT was performed of the pelvis to demonstrate the iliac marrow spaces. Maximal barrier sterile technique utilized including caps, mask, sterile gowns, sterile gloves, large sterile drape, hand hygiene, and betadine prep. Under sterile conditions and local  anesthesia, an 11 gauge coaxial bone biopsy needle was advanced into the right iliac marrow space. Needle position  was confirmed with CT imaging. Initially, bone marrow aspiration was performed. Next, the 11 gauge outer cannula was utilized to obtain a right iliac bone marrow core biopsy. Needle was removed. Hemostasis was obtained with compression. The patient tolerated the procedure well. Samples were prepared with the cytotechnologist. IMPRESSION: Technically successful CT-guided bone marrow aspiration and biopsy of the right iliac bone. Electronically Signed   By: Jacqulynn Cadet M.D.   On: 08/19/2019 10:38   Dg Chest Port 1 View  Result Date: 08/23/2019 CLINICAL DATA:  Shortness of breath EXAM: PORTABLE CHEST 1 VIEW COMPARISON:  Radiograph 08/22/2019 FINDINGS: Mild left anterior obliquity. Redemonstration of a 9 mm nodule in the right mid lung, seen on comparison CT. Stable bandlike areas of atelectasis and/or scarring. Additional hazy basilar atelectatic changes are present. No significant reaccumulation of the left effusion. No right effusion. Cardiomediastinal contours are stable. No acute osseous or soft tissue abnormality. IMPRESSION: 1. Unchanged 9 mm nodule in the right mid lung, seen on comparison CT. 2. No significant reaccumulation of the left effusion post thoracentesis. 3. Stable bandlike areas of atelectasis and/or scarring. Electronically Signed   By: Lovena Le M.D.   On: 08/23/2019 06:00   Dg Chest Port 1 View  Result Date: 08/22/2019 CLINICAL DATA:  Shortness of breath. EXAM: PORTABLE CHEST 1 VIEW COMPARISON:  08/21/2019 FINDINGS: 0400 hours. Leftward patient rotation. The cardio pericardial silhouette is enlarged. Interval progression of retrocardiac left base collapse/consolidation with persistent small left effusion. Infrahilar right base atelectasis or infiltrate noted. Nodular density noted at the left base may be a nipple shadow. IMPRESSION: Rotated film with retrocardiac  left base collapse/consolidation and effusion. Small nodular density at the right base superimposed on the right fifth rib. Attention on follow-up recommended. Electronically Signed   By: Misty Stanley M.D.   On: 08/22/2019 07:03   Dg Chest Port 1 View  Result Date: 08/21/2019 CLINICAL DATA:  Shortness of breath. EXAM: PORTABLE CHEST 1 VIEW COMPARISON:  08/20/2019 FINDINGS: 0426 hours. Cardiopericardial silhouette is at upper limits of normal for size. Small bilateral pleural effusions are again noted with bibasilar collapse/consolidation. The visualized bony structures of the thorax are intact. Telemetry leads overlie the chest. IMPRESSION: Stable exam. Small bilateral pleural effusions with bibasilar collapse/consolidation. Electronically Signed   By: Misty Stanley M.D.   On: 08/21/2019 07:21   Dg Chest Port 1 View  Result Date: 08/20/2019 CLINICAL DATA:  Shortness of breath EXAM: PORTABLE CHEST 1 VIEW COMPARISON:  Two days ago FINDINGS: Normal heart size. Haziness of the bilateral lower chest from layering pleural fluid with atelectasis by recent CT. No air bronchogram. No pneumothorax. IMPRESSION: Pleural effusions and atelectasis that have increased from 2 days ago. Electronically Signed   By: Monte Fantasia M.D.   On: 08/20/2019 07:33   Dg Chest Port 1 View  Result Date: 08/18/2019 CLINICAL DATA:  Shortness of breath EXAM: PORTABLE CHEST 1 VIEW COMPARISON:  08/18/2019 FINDINGS: Heart is normal size. Bilateral perihilar and lower lobe opacities. No visible effusions. No acute bony abnormality. IMPRESSION: Perihilar and lower lobe opacities could reflect atelectasis, edema or infection. Electronically Signed   By: Rolm Baptise M.D.   On: 08/18/2019 21:19   Dg Chest Port 1 View  Result Date: 08/18/2019 CLINICAL DATA:  Dyspnea. EXAM: PORTABLE CHEST 1 VIEW COMPARISON:  August 17, 2019. FINDINGS: Stable cardiomediastinal silhouette. No pneumothorax is noted. Mild bibasilar atelectasis or edema  is noted with probable small pleural effusions. Bony thorax is unremarkable. IMPRESSION: Mild bibasilar atelectasis  or edema is noted with probable small pleural effusions. Electronically Signed   By: Marijo Conception M.D.   On: 08/18/2019 08:27   Ct Bone Marrow Biopsy & Aspiration  Result Date: 08/19/2019 INDICATION: 58 year old male with pancytopenia. He presents for bone marrow biopsy as an inpatient. EXAM: CT GUIDED BONE MARROW ASPIRATION AND CORE BIOPSY Interventional Radiologist:  Criselda Peaches, MD MEDICATIONS: None. ANESTHESIA/SEDATION: 2 mg Versed were administered for anxiolysis. This does not constitute moderate sedation. FLUOROSCOPY TIME:  None. COMPLICATIONS: None immediate. Estimated blood loss: <25 mL PROCEDURE: Informed written consent was obtained from the patient after a thorough discussion of the procedural risks, benefits and alternatives. All questions were addressed. Maximal Sterile Barrier Technique was utilized including caps, mask, sterile gowns, sterile gloves, sterile drape, hand hygiene and skin antiseptic. A timeout was performed prior to the initiation of the procedure. The patient was positioned prone and non-contrast localization CT was performed of the pelvis to demonstrate the iliac marrow spaces. Maximal barrier sterile technique utilized including caps, mask, sterile gowns, sterile gloves, large sterile drape, hand hygiene, and betadine prep. Under sterile conditions and local anesthesia, an 11 gauge coaxial bone biopsy needle was advanced into the right iliac marrow space. Needle position was confirmed with CT imaging. Initially, bone marrow aspiration was performed. Next, the 11 gauge outer cannula was utilized to obtain a right iliac bone marrow core biopsy. Needle was removed. Hemostasis was obtained with compression. The patient tolerated the procedure well. Samples were prepared with the cytotechnologist. IMPRESSION: Technically successful CT-guided bone marrow  aspiration and biopsy of the right iliac bone. Electronically Signed   By: Jacqulynn Cadet M.D.   On: 08/19/2019 10:38   [place] Core Biopsy (lymph Nodes)  Result Date: 08/18/2019 INDICATION: No known primary, now with extensive lymphadenopathy and splenomegaly worrisome for lymphoma. Please perform ultrasound-guided right inguinal lymph node biopsy for tissue diagnostic purposes. EXAM: ULTRASOUND-GUIDED RIGHT INGUINAL LYMPH NODE BIOPSY COMPARISON:  Chest CT-08/17/2019; CT abdomen and pelvis-earlier same day MEDICATIONS: None ANESTHESIA/SEDATION: None COMPLICATIONS: None immediate. TECHNIQUE: Informed written consent was obtained from the patient after a discussion of the risks, benefits and alternatives to treatment. Questions regarding the procedure were encouraged and answered. Initial ultrasound scanning demonstrated 2 adjacent pathologically enlarged right inguinal lymph nodes compatible with the findings seen on preceding abdominal CT. The dominant approximately 2.7 x 1.6 cm right inguinal lymph node was targeted for biopsy given location and sonographic window (image 7). An ultrasound image was saved for documentation purposes. The procedure was planned. A timeout was performed prior to the initiation of the procedure. The operative was prepped and draped in the usual sterile fashion, and a sterile drape was applied covering the operative field. A timeout was performed prior to the initiation of the procedure. Local anesthesia was provided with 1% lidocaine with epinephrine. Under direct ultrasound guidance, an 18 gauge core needle device was utilized to obtain to obtain 6 core needle biopsies of the dominant right inguinal lymph node. The samples were placed in saline and submitted to pathology. The needle was removed and hemostasis was achieved with manual compression. Post procedure scan was negative for significant hematoma. A dressing was placed. The patient tolerated the procedure well without  immediate postprocedural complication. IMPRESSION: Technically successful ultrasound guided core needle biopsy of dominant right inguinal lymph node. Electronically Signed   By: Sandi Mariscal M.D.   On: 08/18/2019 13:39   Vas [place] Lower Extremity Venous (dvt)  Result Date: 08/18/2019  Lower Venous Study Indications:  Elevated d dimer.  Comparison Study: no prior Performing Technologist: Abram Sander RVS  Examination Guidelines: A complete evaluation includes B-mode imaging, spectral Doppler, color Doppler, and power Doppler as needed of all accessible portions of each vessel. Bilateral testing is considered an integral part of a complete examination. Limited examinations for reoccurring indications may be performed as noted.  +---------+---------------+---------+-----------+----------+--------------+ RIGHT    CompressibilityPhasicitySpontaneityPropertiesThrombus Aging +---------+---------------+---------+-----------+----------+--------------+ CFV      Full           Yes      Yes                                 +---------+---------------+---------+-----------+----------+--------------+ SFJ      Full                                                        +---------+---------------+---------+-----------+----------+--------------+ FV Prox  Full                                                        +---------+---------------+---------+-----------+----------+--------------+ FV Mid   Full                                                        +---------+---------------+---------+-----------+----------+--------------+ FV DistalFull                                                        +---------+---------------+---------+-----------+----------+--------------+ PFV      Full                                                        +---------+---------------+---------+-----------+----------+--------------+ POP      Full           Yes      Yes                                  +---------+---------------+---------+-----------+----------+--------------+ PTV      Full                                                        +---------+---------------+---------+-----------+----------+--------------+ PERO     Full                                                        +---------+---------------+---------+-----------+----------+--------------+   +---------+---------------+---------+-----------+----------+--------------+  LEFT     CompressibilityPhasicitySpontaneityPropertiesThrombus Aging +---------+---------------+---------+-----------+----------+--------------+ CFV      Full           Yes      Yes                                 +---------+---------------+---------+-----------+----------+--------------+ SFJ      Full                                                        +---------+---------------+---------+-----------+----------+--------------+ FV Prox  Full                                                        +---------+---------------+---------+-----------+----------+--------------+ FV Mid   Full                                                        +---------+---------------+---------+-----------+----------+--------------+ FV DistalFull                                                        +---------+---------------+---------+-----------+----------+--------------+ PFV      Full                                                        +---------+---------------+---------+-----------+----------+--------------+ POP      Full           Yes      Yes                                 +---------+---------------+---------+-----------+----------+--------------+ PTV      Full                                                        +---------+---------------+---------+-----------+----------+--------------+ PERO     Full                                                         +---------+---------------+---------+-----------+----------+--------------+     Summary: Right: There is no evidence of deep vein thrombosis in the lower extremity. No cystic structure found in the popliteal fossa. Left: There is no evidence of deep vein thrombosis in the lower extremity. No cystic structure found in the popliteal fossa.  *  See table(s) above for measurements and observations. Electronically signed by Harold Barban MD on 08/18/2019 at 1:53:01 PM.    Final    US Thoracentesis Asp Pleural Space W/img Guide  Result Date: 08/24/2019 INDICATION: Patient with shortness of breath, left pleural effusion. Request made for diagnostic and therapeutic left thoracentesis. EXAM: ULTRASOUND GUIDED DIAGNOSTIC AND THERAPEUTIC LEFT THORACENTESIS MEDICATIONS: 10 mL 1% lidocaine COMPLICATIONS: None immediate. PROCEDURE: An ultrasound guided thoracentesis was thoroughly discussed with the patient and questions answered. The benefits, risks, alternatives and complications were also discussed. The patient understands and wishes to proceed with the procedure. Written consent was obtained. Ultrasound was performed to localize and mark an adequate pocket of fluid in the left chest. The area was then prepped and draped in the normal sterile fashion. 1% Lidocaine was used for local anesthesia. Under ultrasound guidance a 6 Fr Safe-T-Centesis catheter was introduced. Thoracentesis was performed. The catheter was removed and a dressing applied. FINDINGS: A total of approximately 550 mL of yellow fluid was removed. Samples were sent to the laboratory as requested by the clinical team. IMPRESSION: Successful ultrasound guided left thoracentesis yielding 550 mL of pleural fluid. Read by: Brynda Greathouse PA-C Electronically Signed   By: Jerilynn Mages.  Shick M.D.   On: 08/22/2019 16:59    ASSESSMENT AND PLAN: 1.    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  The patient presented with bulky lymphadenopathy. 2.   Pancytopenia 3.  Lactic acidosis/sepsis, resolved 4.  Dyspnea secondary to left greater than right pleural effusions, resolved 5.  Hyponatremia, improved 6.  Anxiety 7.  Chronic back pain 8.  Tobacco dependence  -Right inguinal lymph node biopsy consistent with non-Hodgkin B-cell lymphoma.  Bone marrow biopsy most consistent with chronic lymphocytic leukemia/small lymphocytic lymphoma-fish (B-cell panel) is pending, and pleural fluid showed atypical lymphocytes suspicious for lymphoproliferative process.  Findings have been discussed.  Recommend rituximab ministration as an inpatient.  Adverse effects of this medication have been discussed including but not limited to nausea, vomiting, infusion reaction.  Discussed with hospitalist and will attempt to transfer the patient to the oncology unit today for rituximab administration.  Compazine 10 mg p.o. every 6 hours has been ordered as needed for nausea and vomiting.  We will plan for chemotherapy administration as an outpatient as his counts begin to improve. -The patient is at high risk for tumor lysis syndrome given his bulky lymphadenopathy.  We will restart him on IV fluids consisting of normal saline at 50 cc/h. Begin allopurinol 300 mg daily.  Monitor CMET and uric acid closely. -The patient presented with pancytopenia likely due to bone marrow involvement of his lymphoma.  White blood cell count is normal today.  His hemoglobin is 7.5 and would recommend PRBC transfusion for hemoglobin less than 8.  Recommend platelet transfusion if platelets drop below 20,000 or active bleeding.   -He was counseled about smoking cessation.   LOS: 8 days   Mikey Bussing, DNP, AGPCNP-BC, AOCNP 08/25/19   ADDENDUM: Hematology/Oncology Attending: I had a face-to-face encounter with the patient today.  I recommended his care plan.  I agree with the above note.  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  The patient has extensive lymphadenopathy all over his body as well as significant splenomegaly and bone marrow involvement. I had a lengthy discussion with the patient today and recommended for him to proceed with 1  dose of Rituxan while he is in the hospital to try decreasing the tumor burden before discharge. I will arrange for the patient to have 2D echo during his hospitalization. Once the patient is discharged from the hospital I will discuss with him treatment options probably with CHOP/Rituxan or bendamustine and Rituxan. We will monitor him for tumor lysis during this hospitalization and the patient was started on allopurinol as well as IV hydration. Thank you for taking good care of [name], we will continue to follow up the patient with you and assist in his management.  Disclaimer: This note was dictated with voice recognition software. Similar sounding words can inadvertently be transcribed and may be missed upon review. Eilleen Kempf, MD

## 2019-08-25 NOTE — Significant Event (Signed)
Rapid Response Event Note  Overview: Time Called: 1640 Arrival Time: 1643 Event Type: Respiratory  Initial Focused Assessment:  RRT called due to chemo reaction. Bedside RN reports patient had received approximately 30 minutes of 1st dose of Rituxin, prior to new shortness of breath. Chemo reaction kit obtained prior to my arrival. Benadryl PO received prior to chemo dosing. On arrival patient sitting up in bed with labored respirations, accessory muscle use, and tachypnea. Hives diffusely noted on abdomen. IV Pepcid and IV SoluMedrol started as part of the chemo reaction kit. Oncology MD paged. Hospitalist called as well and at bedside for evaluation. Patients shortness of breath easing off, only complaints at this time is chills. Temperature 97.8. Blankets applied. Patients oxygen titrated down, and patient able to speak in full sentences. Vitals signs stable outside of tachycardia. Plan to transfer to telemetry. Called house coverage to obtain tele bed.  Interventions:  Oxygen applied Pepcid SoluMedrol Transfer to telemetry   Event Summary: Name of Physician Notified: Antionette Poles. MD at [place] [facility]  Name of Consulting Physician Notified: Dessie Coma MD at 1650  Outcome: Stayed in room and stabalized  Event End Time: [place]

## 2019-08-25 NOTE — Plan of Care (Signed)
  Problem: Education: Goal: Knowledge of General Education information will improve Description: Including pain rating scale, medication(s)/side effects and non-pharmacologic comfort measures Outcome: Progressing   Problem: Coping: Goal: Level of anxiety will decrease Outcome: Progressing   Problem: Activity: Goal: Risk for activity intolerance will decrease Outcome: Completed/Met

## 2019-08-25 NOTE — Significant Event (Signed)
Rapid response called today at around 5:00 after he was given Rituxan.  Patient seen and examined at the bedside.  He was extremely anxious, tachycardic, tachypneic.  Lung examination revealed decreased air entry on the left side( same as in morning exam), no wheezes or crackles.  He was given a dose of IV steroid, Benadryl, Pepcid for possible anaphylactic reaction.  He also developed some rash which gradually faded.  We stopped Rituxan transfusion. Patient remained tachycardic.  Also noted to be febrile.  I strongly believe that his fever is from underlying lymphoma, he has just completed antibiotics course.  I will send blood cultures.  I will hold on starting antibiotics for now.  Since patient has remained persistently tachycardic since last few days, I want to check CT angio to rule out pulmonary embolism.His last CT angio did not show any PE however he has high chance of developing PE due to underlying malignancy. Patient also appeared very  anxious, so he was given a dose of IV Ativan 1 mg .I will move the patient to stepdown for close monitoring.

## 2019-08-25 NOTE — Progress Notes (Signed)
Chemo teaching for Rituxan provided to patient. Side effects explained and pt instructed to call at any sign of distress. Patient verbalized understanding.

## 2019-08-25 NOTE — Progress Notes (Signed)
Rt called for rapid response. Pt placed on NRB for SOB. Pt had reaction to chemo medication. No further orders for rt at this time. Pt in no distress.

## 2019-08-25 NOTE — Progress Notes (Signed)
Nutrition Follow-up  DOCUMENTATION CODES:   Severe malnutrition in context of chronic illness  INTERVENTION:   -Ensure Enlive po BID, each supplement provides 350 kcal and 20 grams of protein  - Pro-stat 30 ml po BID, each supplement provides 100 kcal and 15 grams of protein  - MVI with minerals daily  NUTRITION DIAGNOSIS:   Severe Malnutrition related to chronic illness(concern for lymphoma) as evidenced by severe fat depletion, severe muscle depletion.  Ongoing.  GOAL:   Patient will meet greater than or equal to 90% of their needs  Progressing.  MONITOR:   PO intake, Supplement acceptance, Labs, Weight trends, I & O's  ASSESSMENT:   58-year-old male who presented to the ED on 11/17 with SOB and BLE edema. Pt was recently homeless until a couple of weeks ago. CTA with concern for lymphoma to multiple sites. Pt admitted with severe sepsis.  11/18 - ultrasound-guided biopsy of right inguinal lymph node 11/19 - s/p bone marrow biopsy  Patient currently consuming 100% of meals at this time. Appetite improving this admission. Pt is not drinking Ensure supplements but is taking Prostat supplements. Will continue as appetite may change following treatment. Pt transferred to 6E to begin rituximab.  Admission weight: 155 lbs. Current weight: 147 lbs.   I/Os: -10.3L since admit UOP: 5L x 24 hrs  Medications: Folic acid tablet, Multivitamin with minerals daily Labs reviewed: Low Na  Diet Order:   Diet Order            Diet regular Room service appropriate? Yes; Fluid consistency: Thin  Diet effective now              EDUCATION NEEDS:   Education needs have been addressed  Skin:  Skin Assessment: Reviewed RN Assessment  Last BM:  11/25  Height:   Ht Readings from Last 1 Encounters:  08/18/19 5' 11" (1.803 m)    Weight:   Wt Readings from Last 1 Encounters:  08/25/19 66.7 kg    Ideal Body Weight:  78.2 kg  BMI:  Body mass index is 20.5  kg/m.  Estimated Nutritional Needs:   Kcal:  2100-2300  Protein:  100-115 grams  Fluid:  >/= 2.0 L   Lindsey Baker, MS, RD, LDN Inpatient Clinical Dietitian Pager: [phone] After Hours Pager: [phone]  

## 2019-08-25 NOTE — Progress Notes (Signed)
Pt transferred to room 1413. Report given to Gerri Spore, RN

## 2019-08-26 ENCOUNTER — Encounter (HOSPITAL_COMMUNITY): Payer: Self-pay

## 2019-08-26 DIAGNOSIS — L8990 Pressure ulcer of unspecified site, unspecified stage: Secondary | ICD-10-CM | POA: Insufficient documentation

## 2019-08-26 LAB — CBC WITH DIFFERENTIAL/PLATELET
Abs Immature Granulocytes: 0.05 10*3/uL (ref 0.00–0.07)
Basophils Absolute: 0 10*3/uL (ref 0.0–0.1)
Basophils Relative: 0 %
Eosinophils Absolute: 0 10*3/uL (ref 0.0–0.5)
Eosinophils Relative: 0 %
HCT: 23.2 % — ABNORMAL LOW (ref 39.0–52.0)
Hemoglobin: 7.2 g/dL — ABNORMAL LOW (ref 13.0–17.0)
Immature Granulocytes: 1 %
Lymphocytes Relative: 44 %
Lymphs Abs: 2.1 10*3/uL (ref 0.7–4.0)
MCH: 27.8 pg (ref 26.0–34.0)
MCHC: 31 g/dL (ref 30.0–36.0)
MCV: 89.6 fL (ref 80.0–100.0)
Monocytes Absolute: 0.3 10*3/uL (ref 0.1–1.0)
Monocytes Relative: 6 %
Neutro Abs: 2.3 10*3/uL (ref 1.7–7.7)
Neutrophils Relative %: 49 %
Platelets: 43 10*3/uL — ABNORMAL LOW (ref 150–400)
RBC: 2.59 MIL/uL — ABNORMAL LOW (ref 4.22–5.81)
RDW: 21.5 % — ABNORMAL HIGH (ref 11.5–15.5)
WBC: 4.7 10*3/uL (ref 4.0–10.5)
nRBC: 0 % (ref 0.0–0.2)

## 2019-08-26 LAB — CBC
HCT: 23.9 % — ABNORMAL LOW (ref 39.0–52.0)
Hemoglobin: 7.8 g/dL — ABNORMAL LOW (ref 13.0–17.0)
MCH: 28.7 pg (ref 26.0–34.0)
MCHC: 32.6 g/dL (ref 30.0–36.0)
MCV: 87.9 fL (ref 80.0–100.0)
Platelets: 52 10*3/uL — ABNORMAL LOW (ref 150–400)
RBC: 2.72 MIL/uL — ABNORMAL LOW (ref 4.22–5.81)
RDW: 22 % — ABNORMAL HIGH (ref 11.5–15.5)
WBC: 4.9 10*3/uL (ref 4.0–10.5)
nRBC: 0 % (ref 0.0–0.2)

## 2019-08-26 LAB — COMPREHENSIVE METABOLIC PANEL
ALT: 28 U/L (ref 0–44)
ALT: 32 U/L (ref 0–44)
AST: 64 U/L — ABNORMAL HIGH (ref 15–41)
AST: 67 U/L — ABNORMAL HIGH (ref 15–41)
Albumin: 1.9 g/dL — ABNORMAL LOW (ref 3.5–5.0)
Albumin: 2 g/dL — ABNORMAL LOW (ref 3.5–5.0)
Alkaline Phosphatase: 229 U/L — ABNORMAL HIGH (ref 38–126)
Alkaline Phosphatase: 230 U/L — ABNORMAL HIGH (ref 38–126)
Anion gap: 14 (ref 5–15)
Anion gap: 17 — ABNORMAL HIGH (ref 5–15)
BUN: 31 mg/dL — ABNORMAL HIGH (ref 6–20)
BUN: 33 mg/dL — ABNORMAL HIGH (ref 6–20)
CO2: 25 mmol/L (ref 22–32)
CO2: 22 mmol/L (ref 22–32)
Calcium: 9.4 mg/dL (ref 8.9–10.3)
Calcium: 9.9 mg/dL (ref 8.9–10.3)
Chloride: 94 mmol/L — ABNORMAL LOW (ref 98–111)
Chloride: 95 mmol/L — ABNORMAL LOW (ref 98–111)
Creatinine, Ser: 0.76 mg/dL (ref 0.61–1.24)
Creatinine, Ser: 1.01 mg/dL (ref 0.61–1.24)
GFR calc Af Amer: >60 mL/min (ref >60)
GFR calc Af Amer: >60 mL/min (ref >60)
GFR calc non Af Amer: >60 mL/min (ref >60)
GFR calc non Af Amer: >60 mL/min (ref >60)
Glucose, Bld: 144 mg/dL — ABNORMAL HIGH (ref 70–99)
Glucose, Bld: 257 mg/dL — ABNORMAL HIGH (ref 70–99)
Potassium: 4.2 mmol/L (ref 3.5–5.1)
Potassium: 4.5 mmol/L (ref 3.5–5.1)
Sodium: 133 mmol/L — ABNORMAL LOW (ref 135–145)
Sodium: 134 mmol/L — ABNORMAL LOW (ref 135–145)
Total Bilirubin: 0.9 mg/dL (ref 0.3–1.2)
Total Bilirubin: 1 mg/dL (ref 0.3–1.2)
Total Protein: 4.6 g/dL — ABNORMAL LOW (ref 6.5–8.1)
Total Protein: 4.9 g/dL — ABNORMAL LOW (ref 6.5–8.1)

## 2019-08-26 LAB — BODY FLUID CULTURE: Culture: NO GROWTH

## 2019-08-26 LAB — URIC ACID: Uric Acid, Serum: 6.6 mg/dL (ref 3.7–8.6)

## 2019-08-26 LAB — PREPARE RBC (CROSSMATCH)

## 2019-08-26 LAB — PH, BODY FLUID: pH, Body Fluid: 7.4

## 2019-08-26 LAB — MRSA PCR SCREENING: MRSA by PCR: NEGATIVE

## 2019-08-26 MED ORDER — SODIUM CHLORIDE 0.9% IV SOLUTION: Freq: Once | INTRAVENOUS | Status: DC

## 2019-08-26 NOTE — Progress Notes (Signed)
PROGRESS NOTE    Isaac Pierce  [id number] DOB: [birth year] DOA: 08/17/2019 PCP: Patient, No Pcp Per   Brief Narrative: patient had an adverse reaction to Rituxan. With anxiety, hypertension and tachycardia. Also had a rash with it. He is quite comfortable now. Denies any pain or dyspnea at rest. Appetite has improved.    Assessment & Plan:   Principal Problem:   Severe sepsis ([place]) Active Problems:   Malignancy (HCC)   Pancytopenia (HCC)   Dyspnea   Pleural effusion   Hyponatremia   Protein-calorie malnutrition, severe   Non-Hodgkin lymphoma (HCC)   Pressure injury of skin    DVT prophylaxis: Cotraindicated Code Status: Full code. Discussed by preceding Physician. Family Communication: Sister. I intend to talk to her today Disposition Plan: Will increase activity today and reassess in 1-2 days. He has no safety net..   Consultants: Oncology  Procedures:  Antimicrobials: None   Subjective: No pain or dyspnea , n/v.  Objective: Vitals:   08/26/19 0500 08/26/19 0600 08/26/19 0700 08/26/19 1000  BP: (!) 91/53 (!) 86/60  (!) 96/52  Pulse: (!) 104 (!) 103  (!) 120  Resp: 18 17  (!) 24  Temp:   97.9 F (36.6 C)   TempSrc:   Oral   SpO2: 96% 97%  96%  Weight: 65 kg     Height:        Intake/Output Summary (Last 24 hours) at 08/26/2019 1033 Last data filed at 08/26/2019 0300 Gross per 24 hour  Intake 510.22 ml  Output 650 ml  Net -139.78 ml   Filed Weights   08/25/19 0500 08/25/19 2215 08/26/19 0500  Weight: 66.7 kg 66.6 kg 65 kg    Examination:  General exam: Appears calm and comfortable  Respiratory system: Clear to auscultation.BS are decreased in the bases more on the right. Cardiovascular system: S1 & S2 heard, RRR. No JVD, murmurs, rubs, gallops or clicks. No pedal edema. Gastrointestinal system: Abdomen is nondistend, mild diffuse tenderness noted, no rebound or guarding. No organomegaly or masses felt. Normal bowel sounds heard. Central  nervous system: Alert and oriented. No focal neurological deficits. Extremities: Symmetric 5 x 5 power. Skin: No rashes noted today Psychiatry: Judgement and insight appear normal. Mood & affect appropriate.     Data Reviewed: Neutropenia is noted ANC 1200. HG 7.3. Mg is marginal. Renal Fx is preserved. Elevated liver enzymes noted  CBC: Recent Labs  Lab 08/22/19 0531 08/23/19 0502 08/24/19 0545 08/25/19 0756 08/26/19 0223  WBC 5.2 4.9 5.2 5.0 4.7  NEUTROABS 1.6* 1.2* 1.4* 1.7 2.3  HGB 8.1* 7.3* 7.9* 7.5* 7.2*  HCT 25.0* 22.8* 23.9* 23.6* 23.2*  MCV 86.2 86.4 86.6 88.1 89.6  PLT 42* 46* 46* 53* 43*   Basic Metabolic Panel: Recent Labs  Lab 08/20/19 0216 08/21/19 0539 08/22/19 0531 08/23/19 0502 08/24/19 0545 08/25/19 0530 08/26/19 0223  NA 131* 134* 132* 133* 134* 134* 133*  K 3.5 3.8 3.8 3.1* 3.7 4.4 4.2  CL 96* 97* 93* 95* 97* 95* 94*  CO2 21* 24 27 25 26 26 25   GLUCOSE 107* 80 89 127* 90 88 144*  BUN 13 14 25* 25* 27* 26* 31*  CREATININE 0.63 0.61 0.71 0.72 0.74 0.72 0.76  CALCIUM 8.0* 8.7* 8.7* 8.3* 8.5* 9.5 9.4  MG 1.7 1.5* 1.5* 1.7 1.9  --   --   PHOS 3.0 3.4 3.5 3.0 2.9  --   --    GFR: Estimated Creatinine Clearance: 94.8 mL/min (by C-G formula  based on SCr of 0.76 mg/dL). Liver Function Tests: Recent Labs  Lab 08/22/19 0531 08/23/19 0502 08/24/19 0545 08/25/19 0530 08/26/19 0223  AST 53* 50* 68* 61* 64*  ALT 24 21 27 26 28   ALKPHOS 175* 174* 257* 259* 229*  BILITOT 1.1 0.7 1.0 0.8 0.9  PROT 4.5* 4.3* 4.6* 4.6* 4.6*  ALBUMIN 2.0* 1.7* 1.9* 1.8* 1.9*   No results for input(s): LIPASE, AMYLASE in the last 168 hours. No results for input(s): AMMONIA in the last 168 hours. Coagulation Profile: No results for input(s): INR, PROTIME in the last 168 hours. Cardiac Enzymes: No results for input(s): CKTOTAL, CKMB, CKMBINDEX, TROPONINI in the last 168 hours. BNP (last 3 results) No results for input(s): PROBNP in the last 8760 hours. HbA1C: No  results for input(s): HGBA1C in the last 72 hours. CBG: No results for input(s): GLUCAP in the last 168 hours. Lipid Profile: No results for input(s): CHOL, HDL, LDLCALC, TRIG, CHOLHDL, LDLDIRECT in the last 72 hours. Thyroid Function Tests: No results for input(s): TSH, T4TOTAL, FREET4, T3FREE, THYROIDAB in the last 72 hours. Anemia Panel: No results for input(s): VITAMINB12, FOLATE, FERRITIN, TIBC, IRON, RETICCTPCT in the last 72 hours. Sepsis Labs: Recent Labs  Lab 08/20/19 0216  PROCALCITON 2.31    Recent Results (from the past 240 hour(s))  Culture, Urine     Status: None   Collection Time: 08/17/19  7:14 PM   Specimen: Urine, Clean Catch  Result Value Ref Range Status   Specimen Description   Final    URINE, CLEAN CATCH Performed at [facility], [place] [address] [place], [place] [address]    Special Requests   Final    NONE Performed at [facility], [place] [address], [place], [place] [address]    Culture   Final    NO GROWTH Performed at [facility], [place] [address], [place], [place] [address]    Report Status 08/19/2019 FINAL  Final  SARS CORONAVIRUS 2 (TAT 6-24 HRS) Nasopharyngeal Nasopharyngeal Swab     Status: None   Collection Time: 08/17/19  8:52 PM   Specimen: Nasopharyngeal Swab  Result Value Ref Range Status   SARS Coronavirus 2 NEGATIVE NEGATIVE Final    Comment: (NOTE) SARS-CoV-2 target nucleic acids are NOT DETECTED. The SARS-CoV-2 RNA is generally detectable in upper and lower respiratory specimens during the acute phase of infection. Negative results do not preclude SARS-CoV-2 infection, do not rule out co-infections with other pathogens, and should not be used as the sole basis for treatment or other patient management decisions. Negative results must be combined with clinical observations, patient history, and epidemiological information. The expected result is Negative. Fact Sheet for  Patients: SugarRoll.be Fact Sheet for Healthcare Providers: https://www.woods-mathews.com/ This test is not yet approved or cleared by the [place] FDA and  has been authorized for detection and/or diagnosis of SARS-CoV-2 by FDA under an Emergency Use Authorization (EUA). This EUA will remain  in effect (meaning this test can be used) for the duration of the COVID-19 declaration under Section 56 4(b)(1) of the Act, 21 U.S.C. section 360bbb-3(b)(1), unless the authorization is terminated or revoked sooner. Performed at [facility], [place] [address] [place], [place] [address]   Blood Culture (routine x 2)     Status: Abnormal   Collection Time: 08/17/19  8:55 PM   Specimen: BLOOD  Result Value Ref Range Status   Specimen Description   Final    BLOOD LEFT  ANTECUBITAL Performed at [facility], [place] [address] [place], [place] [address]    Special Requests   Final    BOTTLES DRAWN AEROBIC AND ANAEROBIC Blood Culture adequate volume Performed at [place] [address] [place], [place] [address]    Culture  Setup Time   Final    GRAM POSITIVE COCCI AEROBIC BOTTLE ONLY CRITICAL RESULT CALLED TO, READ BACK BY AND VERIFIED WITH: J. Canadian, PHARMD ([facility]) AT [id number] ON 08/18/19 BY C. JESSUP, MT.    Culture (A)  Final    STAPHYLOCOCCUS SPECIES (COAGULASE NEGATIVE) THE SIGNIFICANCE OF ISOLATING THIS ORGANISM FROM A SINGLE SET OF BLOOD CULTURES WHEN MULTIPLE SETS ARE DRAWN IS UNCERTAIN. PLEASE NOTIFY THE MICROBIOLOGY DEPARTMENT WITHIN ONE WEEK IF SPECIATION AND SENSITIVITIES ARE REQUIRED. Performed at [facility], [place] [address], [place], [place] [address]    Report Status 08/19/2019 FINAL  Final  Blood Culture (routine x 2)     Status: None   Collection Time: 08/17/19  8:55 PM   Specimen: BLOOD  Result Value Ref Range Status   Specimen Description   Final    BLOOD RIGHT  ANTECUBITAL Performed at [place] [address], [place], [place] [address]    Special Requests   Final    BOTTLES DRAWN AEROBIC AND ANAEROBIC Blood Culture results may not be optimal due to an excessive volume of blood received in culture bottles Performed at [place] [address], [place], [place] [address]    Culture   Final    NO GROWTH 5 DAYS Performed at [facility], [place] [address], [place], [place] [address]    Report Status 08/22/2019 FINAL  Final  MRSA PCR Screening     Status: None   Collection Time: 08/18/19 12:22 AM   Specimen: Nasal Mucosa; Nasopharyngeal  Result Value Ref Range Status   MRSA by PCR NEGATIVE NEGATIVE Final    Comment:        The GeneXpert MRSA Assay (FDA approved for NASAL specimens only), is one component of a comprehensive MRSA colonization surveillance program. It is not intended to diagnose MRSA infection nor to guide or monitor treatment for MRSA infections. Performed at [facility], [place] [address], [place], [place] [address]   Culture, blood (routine x 2)     Status: None   Collection Time: 08/19/19  5:48 PM   Specimen: BLOOD RIGHT HAND  Result Value Ref Range Status   Specimen Description   Final    BLOOD RIGHT HAND Performed at [facility], [place] [address], [place], [place] [address]    Special Requests   Final    BOTTLES DRAWN AEROBIC ONLY Blood Culture adequate volume Performed at [place] [address], [place], [place] [address]    Culture   Final    NO GROWTH 5 DAYS Performed at [facility], [place] [address], [place],  [address]    Report Status 08/24/2019 FINAL  Final  Culture, blood (routine x 2)     Status: None   Collection Time: 08/19/19  5:48 PM   Specimen: BLOOD LEFT HAND  Result Value Ref Range Status   Specimen Description   Final    BLOOD LEFT HAND Performed at [facility], [place]  [address], [place],  [address]    Special Requests   Final    BOTTLES DRAWN AEROBIC ONLY Blood Culture adequate  volume Performed at [place], [place] [address], [place], [place] [address]    Culture   Final    NO GROWTH 5 DAYS Performed at [facility], [place] [address], [place], [place] [address]    Report Status 08/24/2019 FINAL  Final  Body fluid culture     Status: None   Collection Time: 08/22/19  4:00 PM   Specimen: PATH Cytology Pleural fluid  Result Value Ref Range Status   Specimen Description   Final    PLEURAL Performed at [place] [address] [place], [place] [address]    Special Requests   Final    NONE Performed at [facility], [place] [address] [place], [place] [address]    Gram Stain   Final    ABUNDANT WBC PRESENT, PREDOMINANTLY MONONUCLEAR NO ORGANISMS SEEN    Culture   Final    NO GROWTH 3 DAYS Performed at [place] [address], [place], [place] [address]    Report Status 08/26/2019 FINAL  Final  Acid Fast Smear (AFB)     Status: None   Collection Time: 08/22/19  4:00 PM   Specimen: PATH Cytology Pleural fluid  Result Value Ref Range Status   AFB Specimen Processing Concentration  Final   Acid Fast Smear Negative  Final    Comment: (NOTE) Performed At: [facility] [place], [place] [id number] Rush Farmer MD [id number]    Source (AFB) PLEURAL  Final    Comment: Performed at [facility], [place] [address], [place], [place] [address]  MRSA PCR Screening     Status: None   Collection Time: 08/25/19 10:03 PM   Specimen: Nasal Mucosa; Nasopharyngeal  Result Value Ref Range Status   MRSA by PCR NEGATIVE NEGATIVE Final    Comment:        The GeneXpert MRSA Assay (FDA approved for NASAL specimens only), is one component of a comprehensive MRSA colonization surveillance program. It is not intended to diagnose MRSA infection  nor to guide or monitor treatment for MRSA infections. Performed at [facility], [place] [address], [place], [place] [address]          Radiology Studies: Ct Angio Chest Pe W Or Wo Contrast  Result Date: 08/25/2019 CLINICAL DATA:  Tachycardia. 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 EXAM: CT ANGIOGRAPHY CHEST WITH CONTRAST TECHNIQUE: Multidetector CT imaging of the chest was performed using the standard protocol during bolus administration of intravenous contrast. Multiplanar CT image reconstructions and MIPs were obtained to evaluate the vascular anatomy. CONTRAST:  157mL OMNIPAQUE IOHEXOL 350 MG/ML SOLN COMPARISON:  Chest radiograph dated 08/23/2019. CTA chest dated 08/17/2019. FINDINGS: Cardiovascular: Evaluation is constrained by respiratory motion, particularly in the bilateral lower lobes. Within that constraint, there is no evidence of pulmonary embolism to the segmental level. No evidence of thoracic aortic aneurysm or dissection. The heart is normal in size. Moderate pericardial soft tissue/metastases inferiorly (series 4/image 73), unchanged, likely related to the patient's known lymphoproliferative disorder. Mediastinum/Nodes: Bulky thoracic lymphadenopathy, related to the patient's known lymphoproliferative disorder, including: --2.3 cm short axis high right paratracheal node (series 4/image 28) --3.0 cm short axis AP window node (series 4/image 35) --1.4 cm short axis right hilar node (series 4/image 42) --2.6 cm short axis subcarinal node (series 4/image 43) --2.1 cm short axis intralobar/right lower lobe node (series 4/image 55) Visualized thyroid is unremarkable. Lungs/Pleura: Small bilateral pulmonary nodules, most of which are  pleural/subpleural, unchanged from recent CT, including a dominant 9 mm nodule in the posterior right lower lobe (series 6/image 36). Dominant central pulmonary nodule measures 8 mm in the left lower lobe (series 6/image 78). These findings are  likely related to the patient's known lymphoma. Moderate left pleural effusion, stable versus mildly increased, partially loculated. Trace right pleural effusion, mildly decreased. Associated bilateral lower lobe atelectasis. No pneumothorax. Upper Abdomen: Visualized upper abdomen is notable for a small hiatal hernia, masses splenomegaly, and a splenic infarct, better evaluated on recent CT abdomen/pelvis. Musculoskeletal: Degenerative changes of the visualized thoracolumbar spine. Review of the MIP images confirms the above findings. IMPRESSION: No evidence of pulmonary embolism. Bulky thoracic lymphadenopathy, pericardial thickening/soft tissue, and bilateral pulmonary nodules, likely related to the patient's known B-cell lymphoma, grossly unchanged. Moderate left pleural effusion, stable versus mildly increased, partially loculated. Trace right pleural effusion, mildly decreased. Associated bilateral lower lobe atelectasis. Overall, there is no significant interval change from recent CTs. Electronically Signed   By: Julian Hy M.D.   On: 08/25/2019 22:00        Scheduled Meds:  sodium chloride   Intravenous Once   allopurinol  300 mg Oral Daily   Chlorhexidine Gluconate Cloth  6 each Topical Daily   feeding supplement (ENSURE ENLIVE)  237 mL Oral BID BM   feeding supplement (PRO-STAT SUGAR FREE 64)  30 mL Oral BID   folic acid  1 mg Oral Daily   furosemide  40 mg Intravenous Daily   hydrocortisone   Rectal TID   ipratropium  0.5 mg Nebulization BID   levalbuterol  0.63 mg Nebulization BID   mouth rinse  15 mL Mouth Rinse BID   multivitamin with minerals  1 tablet Oral Daily   nicotine  7 mg Transdermal Daily   nystatin  5 mL Mouth/Throat QID   pantoprazole  20 mg Oral Daily   Continuous Infusions:  sodium chloride     sodium chloride 20 mL/hr at 08/25/19 1336   sodium chloride     1. Up ambulate in anticipation of DC in the next day or 2. Coordinate care  with DC planner. 2. Transfuse one unit of PRBCs.  LOS: 9 days   3. Push oral fluid. Monitor ANC and HB.    Time spent: 50 minutes    Pearlean Brownie, MD [facility] Pager [phone]  If 7PM-7AM, please contact night-coverage [email or web address] Password [facility] 08/26/2019, 10:33 AM

## 2019-08-27 ENCOUNTER — Encounter (HOSPITAL_COMMUNITY): Payer: Self-pay | Admitting: Internal Medicine

## 2019-08-27 LAB — TYPE AND SCREEN
ABO/RH(D): O POS
Antibody Screen: NEGATIVE
Unit division: 0
Unit division: 0

## 2019-08-27 LAB — BPAM RBC
Blood Product Expiration Date: 202012182359
Blood Product Expiration Date: 202012292359
ISSUE DATE / TIME: 202011231738
ISSUE DATE / TIME: 202011261512
Unit Type and Rh: 5100
Unit Type and Rh: 5100

## 2019-08-27 NOTE — Evaluation (Signed)
Physical Therapy Evaluation Patient Details Name: Isaac Pierce MRN: [id number] DOB: [birth year] Today's Date: 08/27/2019   History of Present Illness  Pt admitted with severe sepsis, hypnatremia, pleural effusion and non-hodgkin Lymphoma  Clinical Impression  Pt admitted as above and presenting with functional mobility limitations 2* generalized weakness, poor endurance and balance deficits.  Pt hopes to regain IND and return to previous living arrangement - alone in mobile home.    Follow Up Recommendations Home health PT;No PT follow up(dependent on acute stay progress and home assist level)    Equipment Recommendations  None recommended by PT    Recommendations for Other Services       Precautions / Restrictions Precautions Precautions: Fall Restrictions Weight Bearing Restrictions: No      Mobility  Bed Mobility Overal bed mobility: Needs Assistance Bed Mobility: Supine to Sit     Supine to sit: Supervision     General bed mobility comments: no physical assist  Transfers Overall transfer level: Needs assistance Equipment used: None Transfers: Sit to/from Stand Sit to Stand: Min assist;Mod assist;+2 physical assistance;+2 safety/equipment         General transfer comment: assist to bring wt up and fwd and to balance in initial standing  Ambulation/Gait Ambulation/Gait assistance: Min assist;Mod assist;+2 physical assistance;+2 safety/equipment Gait Distance (Feet): 50 Feet Assistive device: 2 person hand held assist Gait Pattern/deviations: Step-through pattern;Decreased step length - right;Decreased step length - left;Shuffle;Staggering left;Staggering right;Narrow base of support Gait velocity: decr   General Gait Details: unstable with one LOB requiring assist to regain balance.   Distance ltd by elevating HR  Stairs            Wheelchair Mobility    Modified Rankin (Stroke Patients Only)       Balance Overall balance assessment: Needs  assistance Sitting-balance support: No upper extremity supported;Feet supported Sitting balance-Leahy Scale: Good     Standing balance support: No upper extremity supported Standing balance-Leahy Scale: Fair                               Pertinent Vitals/Pain Pain Assessment: Faces Faces Pain Scale: Hurts little more Pain Location: L foot 2* gout Pain Descriptors / Indicators: Grimacing Pain Intervention(s): Limited activity within patient's tolerance;Monitored during session;Premedicated before session    Home Living Family/patient expects to be discharged to:: Private residence Living Arrangements: Alone Available Help at Discharge: Available PRN/intermittently;Family Type of Home: Mobile home       Home Layout: One level Home Equipment: Walker - 2 wheels Additional Comments: Pt lives in mobile home alone and sister lives 512ft away with her family    Prior Function Level of Independence: Independent               Journalist, newspaper        Extremity/Trunk Assessment   Upper Extremity Assessment Upper Extremity Assessment: Generalized weakness    Lower Extremity Assessment Lower Extremity Assessment: Generalized weakness       Communication   Communication: No difficulties  Cognition Arousal/Alertness: Awake/alert Behavior During Therapy: WFL for tasks assessed/performed;Impulsive Overall Cognitive Status: Within Functional Limits for tasks assessed                                        General Comments      Exercises     Assessment/Plan  PT Assessment Patient needs continued PT services  PT Problem List Decreased strength;Decreased activity tolerance;Decreased balance;Decreased mobility;Decreased cognition;Decreased knowledge of use of DME;Decreased safety awareness;Pain       PT Treatment Interventions DME instruction;Stair training;Gait training;Functional mobility training;Therapeutic activities;Therapeutic  exercise;Patient/family education;Balance training    PT Goals (Current goals can be found in the Care Plan section)  Acute Rehab PT Goals Patient Stated Goal: Regain IND PT Goal Formulation: With patient Time For Goal Achievement: 09/10/19 Potential to Achieve Goals: Good    Frequency Min 3X/week   Barriers to discharge Decreased caregiver support Pt unclear on level of assist family can provide    Co-evaluation               AM-PAC PT "6 Clicks" Mobility  Outcome Measure Help needed turning from your back to your side while in a flat bed without using bedrails?: None Help needed moving from lying on your back to sitting on the side of a flat bed without using bedrails?: A Little Help needed moving to and from a bed to a chair (including a wheelchair)?: A Little Help needed standing up from a chair using your arms (e.g., wheelchair or bedside chair)?: A Lot Help needed to walk in hospital room?: A Little Help needed climbing 3-5 steps with a railing? : A Lot 6 Click Score: 17    End of Session Equipment Utilized During Treatment: Gait belt Activity Tolerance: Other (comment)(elevating HR) Patient left: Other (comment);with bed alarm set(sitting EOB) Nurse Communication: Mobility status;Other (comment)(elevating HR, pt EOB with alarm set) PT Visit Diagnosis: Unsteadiness on feet (R26.81);Muscle weakness (generalized) (M62.81);Difficulty in walking, not elsewhere classified (R26.2)    Time: 1140-1210 PT Time Calculation (min) (ACUTE ONLY): 30 min   Charges:   PT Evaluation $PT Eval Low Complexity: 1 Low PT Treatments $Gait Training: 8-22 mins        Debe Coder PT Acute Rehabilitation Services Pager [phone] Office [phone]   Isaac Pierce 08/27/2019, 1:58 PM

## 2019-08-27 NOTE — Progress Notes (Addendum)
HEMATOLOGY-ONCOLOGY PROGRESS NOTE  SUBJECTIVE: Rituxan ordered for 08/25/2019. Received about 30 minutes of the dose and then developed an infusion reaction including shortness of breath, tachycardia, tachypnea, and mild rash. Received IV SoluMedrol, Benadryl, and Pepcid. He continued to have tachycardia and fever. CTA of chest did not show evidence of PE. Bulky lymphadenopathy re demonstrated - stable and left pleural effusion stable to mildly increased.  Breathing is stable this morning.  He denies cough and hemoptysis.  Has abdominal distention but no bowel pain.  Denies nausea and vomiting.  Lower extremity edema improved.  Denies bleeding.  REVIEW OF SYSTEMS:   Constitutional: Denies fevers, chills Respiratory: Denies cough, dyspnea or wheezes Cardiovascular: Denies palpitation, chest discomfort Gastrointestinal: Reports abdominal distention.  Denies nausea, heartburn or change in bowel habits Skin: Denies abnormal skin rashes Lymphatics: Reports ongoing lymphadenopathy in his neck and groin Neurological:Denies numbness, tingling or new weaknesses Behavioral/Psych: Mood is stable, no new changes  Extremities: Reports improved lower extremity edema All other systems were reviewed with the patient and are negative.  I have reviewed the past medical history, past surgical history, social history and family history with the patient and they are unchanged from previous note.   PHYSICAL EXAMINATION: ECOG PERFORMANCE STATUS: 1 - Symptomatic but completely ambulatory  Vitals:   08/27/19 0600 08/27/19 0733  BP: (!) 111/59   Pulse: (!) 120   Resp: 18   Temp:  98.7 F (37.1 C)  SpO2: 94% 93%   Filed Weights   08/25/19 2215 08/26/19 0500 08/27/19 0500  Weight: 146 lb 13.2 oz (66.6 kg) 143 lb 4.8 oz (65 kg) 143 lb 4.8 oz (65 kg)    Intake/Output from previous day: 11/26 0701 - 11/27 0700 In: 359.6 [I.V.:44.6; Blood:315] Out: 1675 [Urine:1675]  GENERAL:alert, no distress and  comfortable SKIN: skin color, texture, turgor are normal, no rashes or significant lesions EYES: normal, Conjunctiva are pink and non-injected, sclera clear OROPHARYNX:no exudate, no erythema and lips, buccal mucosa, and tongue normal  NECK: supple, thyroid normal size, non-tender, without nodularity LYMPH: Palpable cervical, axillary, and inguinal lymphadenopathy LUNGS: clear to auscultation and percussion with normal breathing effort HEART: Tachycardic, no murmurs and trace bilateral lower extremity pitting edema ABDOMEN: Positive bowel sounds, distended, splenomegaly noted Musculoskeletal:no cyanosis of digits and no clubbing  NEURO: alert & oriented x 3 with fluent speech, no focal motor/sensory deficits  LABORATORY DATA:  I have reviewed the data as listed CMP Latest Ref Rng & Units 08/26/2019 08/26/2019 08/25/2019  Glucose 70 - 99 mg/dL 257(H) 144(H) 88  BUN 6 - 20 mg/dL 33(H) 31(H) 26(H)  Creatinine 0.61 - 1.24 mg/dL 1.01 0.76 0.72  Sodium 135 - 145 mmol/L 134(L) 133(L) 134(L)  Potassium 3.5 - 5.1 mmol/L 4.5 4.2 4.4  Chloride 98 - 111 mmol/L 95(L) 94(L) 95(L)  CO2 22 - 32 mmol/L '22 25 26  '$ Calcium 8.9 - 10.3 mg/dL 9.9 9.4 9.5  Total Protein 6.5 - 8.1 g/dL 4.9(L) 4.6(L) 4.6(L)  Total Bilirubin 0.3 - 1.2 mg/dL 1.0 0.9 0.8  Alkaline Phos 38 - 126 U/L 230(H) 229(H) 259(H)  AST 15 - 41 U/L 67(H) 64(H) 61(H)  ALT 0 - 44 U/L 32 28 26    Lab Results  Component Value Date   WBC 4.9 08/26/2019   HGB 7.8 (L) 08/26/2019   HCT 23.9 (L) 08/26/2019   MCV 87.9 08/26/2019   PLT 52 (L) 08/26/2019   NEUTROABS 2.3 08/26/2019    Dg Chest 1 View  Result Date: 08/22/2019 CLINICAL  DATA:  Status post thoracentesis. EXAM: CHEST  1 VIEW COMPARISON:  Earlier today at 0400 hours. FINDINGS: 1550 hours. Patient rotated left. Midline trachea. Normal heart size. Interval resolution of left-sided pleural fluid. No right-sided effusion. No pneumothorax. Skin fold projects over the left lower chest.  Significantly improved left base airspace disease. Right infrahilar atelectasis remains. Again identified is a right midlung 7 mm nodule IMPRESSION: Interval left thoracentesis, without pneumothorax. Resolution of left lower lobe airspace disease, with mild right infrahilar atelectasis remaining. Suspicion of a 7 mm right midlung nodule, as before. Electronically Signed   By: Abigail Miyamoto M.D.   On: 08/22/2019 16:17   Dg Chest 2 View  Result Date: 08/17/2019 CLINICAL DATA:  Shortness of breath EXAM: CHEST - 2 VIEW COMPARISON:  None. FINDINGS: Small bilateral pleural effusions. Mild basilar airspace disease. Borderline heart size with mild central vascular congestion. No pneumothorax. IMPRESSION: Mild central vascular congestion with small bilateral effusions. Atelectasis or mild pneumonia at the left greater than right lung base. Electronically Signed   By: Donavan Foil M.D.   On: 08/17/2019 19:57   Dg Abd 1 View  Result Date: 08/21/2019 CLINICAL DATA:  Abdominal swelling and discomfort. EXAM: ABDOMEN - 1 VIEW COMPARISON:  CT, 08/18/2019. FINDINGS: No bowel dilation to suggest obstruction. Bowel is displaced inferiorly by the markedly enlarged spleen. Soft tissues are poorly defined. No evidence of a renal or ureteral stone. There are scattered aortic and iliac artery atherosclerotic calcifications. IMPRESSION: 1. No bowel obstruction. 2. Enlarged spleen. Poorly defined soft tissues consistent with ascites as noted on the prior CT. Electronically Signed   By: Lajean Manes M.D.   On: 08/21/2019 14:20   Ct Angio Chest Pe W Or Wo Contrast  Result Date: 08/25/2019 CLINICAL DATA:  Tachycardia. 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 EXAM: CT ANGIOGRAPHY CHEST WITH CONTRAST TECHNIQUE: Multidetector CT imaging of the chest was performed using the standard protocol during bolus administration of intravenous contrast. Multiplanar CT image reconstructions and MIPs were obtained to evaluate the vascular anatomy.  CONTRAST:  142m OMNIPAQUE IOHEXOL 350 MG/ML SOLN COMPARISON:  Chest radiograph dated 08/23/2019. CTA chest dated 08/17/2019. FINDINGS: Cardiovascular: Evaluation is constrained by respiratory motion, particularly in the bilateral lower lobes. Within that constraint, there is no evidence of pulmonary embolism to the segmental level. No evidence of thoracic aortic aneurysm or dissection. The heart is normal in size. Moderate pericardial soft tissue/metastases inferiorly (series 4/image 73), unchanged, likely related to the patient's known lymphoproliferative disorder. Mediastinum/Nodes: Bulky thoracic lymphadenopathy, related to the patient's known lymphoproliferative disorder, including: --2.3 cm short axis high right paratracheal node (series 4/image 28) --3.0 cm short axis AP window node (series 4/image 35) --1.4 cm short axis right hilar node (series 4/image 42) --2.6 cm short axis subcarinal node (series 4/image 43) --2.1 cm short axis intralobar/right lower lobe node (series 4/image 55) Visualized thyroid is unremarkable. Lungs/Pleura: Small bilateral pulmonary nodules, most of which are pleural/subpleural, unchanged from recent CT, including a dominant 9 mm nodule in the posterior right lower lobe (series 6/image 36). Dominant central pulmonary nodule measures 8 mm in the left lower lobe (series 6/image 78). These findings are likely related to the patient's known lymphoma. Moderate left pleural effusion, stable versus mildly increased, partially loculated. Trace right pleural effusion, mildly decreased. Associated bilateral lower lobe atelectasis. No pneumothorax. Upper Abdomen: Visualized upper abdomen is notable for a small hiatal hernia, masses splenomegaly, and a splenic infarct, better evaluated on recent CT abdomen/pelvis. Musculoskeletal: Degenerative changes of the visualized  thoracolumbar spine. Review of the MIP images confirms the above findings. IMPRESSION: No evidence of pulmonary embolism. Bulky  thoracic lymphadenopathy, pericardial thickening/soft tissue, and bilateral pulmonary nodules, likely related to the patient's known B-cell lymphoma, grossly unchanged. Moderate left pleural effusion, stable versus mildly increased, partially loculated. Trace right pleural effusion, mildly decreased. Associated bilateral lower lobe atelectasis. Overall, there is no significant interval change from recent CTs. Electronically Signed   By: Julian Hy M.D.   On: 08/25/2019 22:00   Ct Angio Chest Pe W/cm &/or Wo Cm  Result Date: 08/17/2019 CLINICAL DATA:  Shortness of breath for 1 month EXAM: CT ANGIOGRAPHY CHEST WITH CONTRAST TECHNIQUE: Multidetector CT imaging of the chest was performed using the standard protocol during bolus administration of intravenous contrast. Multiplanar CT image reconstructions and MIPs were obtained to evaluate the vascular anatomy. CONTRAST:  113m OMNIPAQUE IOHEXOL 350 MG/ML SOLN COMPARISON:  Chest x-ray 08/17/2019 FINDINGS: Cardiovascular: Suboptimal contrast bolus timing and respiratory motion artifact degraded examination of the more distal pulmonary arterial tree. No filling defect within the main or lobar branch pulmonary arteries. No evidence of right heart strain. Heart size is normal. There is pericardial thickening. Thoracic aorta is normal in course and caliber. Mediastinum/Nodes: Bulky mediastinal, bilateral hilar, and bilateral axillary lymphadenopathy. Thyroid, trachea, and esophagus are grossly unremarkable. Lungs/Pleura: Small bilateral pleural effusions, left greater than right with associated compressive atelectasis. There are numerous bilateral noncalcified pulmonary nodules. Reference nodules include 10 mm right lower lobe juxtapleural nodule (series 6, image 88). 9 mm medial right lower lobe nodule (series 6, image 106). 7 mm anterior right middle lobe nodule (series 6, image 107). 7 mm left lower lobe nodule (series 6, image 90). 8 mm lingular nodule (series  6, image 92). No pneumothorax. Upper Abdomen: Hepatosplenomegaly within the visualized upper abdomen. Ill-defined area of low density within the peripheral aspect of the spleen measuring approximately 3.7 x 2.8 cm (series 4, image 135). Musculoskeletal: No chest wall abnormality. No acute or significant osseous findings. Review of the MIP images confirms the above findings. IMPRESSION: 1. Limited exam. No filling defect to the lobar branch level to suggest pulmonary embolism. 2. Bulky mediastinal, bilateral hilar, and bilateral axillary lymphadenopathy. Findings concerning for primary lymphoproliferative process such as lymphoma. Metastatic disease secondary to unknown primary is also a consideration. 3. Multiple bilateral noncalcified pulmonary nodules measuring up to 10 mm, likely a result of the same process as listed above. 4. Small bilateral pleural effusions, left greater than right with associated compressive atelectasis. 5. Hepatosplenomegaly within the visualized upper abdomen with ill-defined area of low density within the peripheral aspect of the spleen measuring 3.7 x 2.8 cm. Findings are concerning for lymphomatous involvement versus metastatic disease. Alternatively, sequela of splenic injury could have a similar appearance. These results were called by telephone at the time of interpretation on 08/17/2019 at 8:49 pm to provider MARGAUX VENTER , who verbally acknowledged these results. Electronically Signed   By: NDavina PokeM.D.   On: 08/17/2019 20:51   [place]domen Complete  Result Date: 08/21/2019 CLINICAL DATA:  Abdominal pain and distention. History of alcohol and drug abuse. EXAM: ABDOMEN ULTRASOUND COMPLETE COMPARISON:  None. FINDINGS: Gallbladder: Small gallstones and sludge. At least 1 polyp noted measuring 3 mm. There is pericholecystic fluid. No sonographic Murphy's sign. Common bile duct: Diameter: 4 mm Liver: Liver appears mildly enlarged. Increased liver parenchymal echogenicity.  No mass or focal lesion. Portal vein is patent on color Doppler imaging with normal direction of blood flow  towards the liver. IVC: No abnormality visualized. Pancreas: Hypoechoic lesion either adjacent to or within the pancreatic head measuring 2.3 x 1.4 cm. This could reflect a pancreatic mass or adjacent enlarged gastrohepatic ligament lymph node a less well-defined hypoechoic area is seen in the pancreas below this. No pancreatic duct dilation. Spleen: Enlarged spleen measuring 24 x 9 x 23 cm. No splenic mass or focal lesion. Right Kidney: Length: 12.1 cm. Normal parenchymal echogenicity. There are hypo to anechoic cortical lesions that are consistent with cysts. Mild hydronephrosis. No stones. Left Kidney: Length: 12.5 cm. Echogenicity within normal limits. No mass or hydronephrosis visualized. Abdominal aorta: No aneurysm visualized. Other findings: Bilateral pleural effusions. IMPRESSION: 1. No convincing acute finding. There are small gallstones, gallbladder sludge and 1 small polyp as well as pericholecystic fluid, but no sonographic Murphy's sign. Gallbladder is also only mildly to moderately distended. No convincing acute cholecystitis. 2. Hepatosplenomegaly. Increased echogenicity of the liver consistent with hepatic steatosis. No liver mass. 3. Bilateral pleural effusions. 4. Mild right hydronephrosis. Hypoechoic to anechoic right renal masses that are likely cysts but not fully characterized. Electronically Signed   By: Lajean Manes M.D.   On: 08/21/2019 14:28   Ct Abdomen Pelvis W Contrast  Result Date: 08/18/2019 CLINICAL DATA:  58 year old with pancytopenia and chest lymphadenopathy. Concern for lymphoproliferative disorder. EXAM: CT ABDOMEN AND PELVIS WITH CONTRAST TECHNIQUE: Multidetector CT imaging of the abdomen and pelvis was performed using the standard protocol following bolus administration of intravenous contrast. CONTRAST:  168m OMNIPAQUE IOHEXOL 300 MG/ML  SOLN COMPARISON:  Chest  CT 08/17/2019 FINDINGS: Lower chest: Small bilateral pleural effusions. Partial visualization of the right infrahilar lymphadenopathy. There may be markedly enlarged lymph nodes adjacent to the distal esophagus. Small amount of pericardial fluid. Again noted is interstitial thickening in the right lower lobe with posterior consolidation in the right lower lobe. Again noted is a elongated nodular structure in the right lower lobe measuring 9 mm on sequence 6, image 20. Pleural-based nodules in the right lower lobe and right middle lobe are similar to the recent comparison examination. Again noted is a pleural-based nodule in the lingula. Focal pleural thickening along the base of the left major fissure is asimilar to the previous examination. Hepatobiliary: Normal appearance of the liver. No discrete liver lesions identified. Main portal venous system is patent. Gallbladder is decompressed. No significant biliary dilatation. Pancreas: Unremarkable. No pancreatic ductal dilatation or surrounding inflammatory changes. Spleen: Spleen is massive for size measuring 27.0 x 10.6 x 17.4 cm, splenic volume is 2489 mL. Scattered areas of non enhancement throughout the periphery of the spleen probably represent infarcts associated with the large size of the spleen. Indeterminate lesion along the left posterior aspect of spleen on sequence 2, image 44 that measures up to 3.8 cm. Small amount of fluid posterior to the spleen near the left hemidiaphragm. Adrenals/Urinary Tract: Adrenal glands are poorly characterized on this examination but no gross abnormality. Compression on the left kidney due to the splenomegaly. Negative for hydronephrosis. Question fullness of the right renal pelvis but this area is poorly characterized. Mild distention of the urinary bladder. High-density material in the urinary bladder could be related to recent chest CT. Stomach/Bowel: Cannot exclude a hiatal hernia. Stomach is compressed by lymph nodes  and the splenomegaly. No evidence for acute bowel inflammation or bowel obstruction. Vascular/Lymphatic: Diffuse atherosclerotic calcifications in the abdominal aorta without aneurysm. Ectasias involving the celiac trunk, measuring roughly 1.1 cm. There is probably stenosis involving the proximal SMA  but poorly characterized on this examination. Venous structures are unremarkable. Evidence for enlarged lymph nodes in the gastrohepatic ligament region. Enlarged lymph nodes in the periaortic region. Enlarged lymph nodes along the iliac nodal chains. Markedly enlarged lymph nodes in the right groin. There is a index lymph node in the right groin that measures 2.8 x 2.1 cm on sequence 2, image 78. Multiple small lymph nodes in both inguinal regions. Reproductive: Prostate is unremarkable. Other: Small amount of free fluid in the pelvis. There appears to be free fluid in the left lower quadrant below the spleen. Negative for free air. Diffuse subcutaneous edema. Dependent fluid in the paraspinal tissues in the lumbar spine. Musculoskeletal: Disc space narrowing at L5-S1. No suspicious bone lesions. IMPRESSION: 1. Massive splenomegaly with abdominal and inguinal lymphadenopathy. Findings are suggestive for a lymphoproliferative process such as lymphoma. 2. Evidence for multiple splenic infarcts likely related to the splenomegaly. Cannot exclude a splenic lesion along the left posterior aspect measuring up to 3.8 cm. 3. Evidence for fluid overload state with diffuse subcutaneous edema, bilateral pleural effusions and ascites. 4. Re-demonstration pleural-based nodularity at the lung bases. There is also septal thickening and a nodular structure in the right lower lobe. Findings are compatible with a neoplastic or lymphoproliferative process. 5. No discrete liver lesions. 6. Aortic Atherosclerosis (ICD10-I70.0). Concern for at least mild stenosis involving the proximal SMA. Ectasia of the celiac trunk. Electronically Signed    By: Markus Daft M.D.   On: 08/18/2019 12:59   Ct Biopsy  Result Date: 08/19/2019 INDICATION: 58 year old male with pancytopenia. He presents for bone marrow biopsy as an inpatient. EXAM: CT GUIDED BONE MARROW ASPIRATION AND CORE BIOPSY Interventional Radiologist:  Criselda Peaches, MD MEDICATIONS: None. ANESTHESIA/SEDATION: 2 mg Versed were administered for anxiolysis. This does not constitute moderate sedation. FLUOROSCOPY TIME:  None. COMPLICATIONS: None immediate. Estimated blood loss: <25 mL PROCEDURE: Informed written consent was obtained from the patient after a thorough discussion of the procedural risks, benefits and alternatives. All questions were addressed. Maximal Sterile Barrier Technique was utilized including caps, mask, sterile gowns, sterile gloves, sterile drape, hand hygiene and skin antiseptic. A timeout was performed prior to the initiation of the procedure. The patient was positioned prone and non-contrast localization CT was performed of the pelvis to demonstrate the iliac marrow spaces. Maximal barrier sterile technique utilized including caps, mask, sterile gowns, sterile gloves, large sterile drape, hand hygiene, and betadine prep. Under sterile conditions and local anesthesia, an 11 gauge coaxial bone biopsy needle was advanced into the right iliac marrow space. Needle position was confirmed with CT imaging. Initially, bone marrow aspiration was performed. Next, the 11 gauge outer cannula was utilized to obtain a right iliac bone marrow core biopsy. Needle was removed. Hemostasis was obtained with compression. The patient tolerated the procedure well. Samples were prepared with the cytotechnologist. IMPRESSION: Technically successful CT-guided bone marrow aspiration and biopsy of the right iliac bone. Electronically Signed   By: Jacqulynn Cadet M.D.   On: 08/19/2019 10:38   Dg Chest Port 1 View  Result Date: 08/23/2019 CLINICAL DATA:  Shortness of breath EXAM: PORTABLE CHEST  1 VIEW COMPARISON:  Radiograph 08/22/2019 FINDINGS: Mild left anterior obliquity. Redemonstration of a 9 mm nodule in the right mid lung, seen on comparison CT. Stable bandlike areas of atelectasis and/or scarring. Additional hazy basilar atelectatic changes are present. No significant reaccumulation of the left effusion. No right effusion. Cardiomediastinal contours are stable. No acute osseous or soft tissue abnormality. IMPRESSION: 1.  Unchanged 9 mm nodule in the right mid lung, seen on comparison CT. 2. No significant reaccumulation of the left effusion post thoracentesis. 3. Stable bandlike areas of atelectasis and/or scarring. Electronically Signed   By: Lovena Le M.D.   On: 08/23/2019 06:00   Dg Chest Port 1 View  Result Date: 08/22/2019 CLINICAL DATA:  Shortness of breath. EXAM: PORTABLE CHEST 1 VIEW COMPARISON:  08/21/2019 FINDINGS: 0400 hours. Leftward patient rotation. The cardio pericardial silhouette is enlarged. Interval progression of retrocardiac left base collapse/consolidation with persistent small left effusion. Infrahilar right base atelectasis or infiltrate noted. Nodular density noted at the left base may be a nipple shadow. IMPRESSION: Rotated film with retrocardiac left base collapse/consolidation and effusion. Small nodular density at the right base superimposed on the right fifth rib. Attention on follow-up recommended. Electronically Signed   By: Misty Stanley M.D.   On: 08/22/2019 07:03   Dg Chest Port 1 View  Result Date: 08/21/2019 CLINICAL DATA:  Shortness of breath. EXAM: PORTABLE CHEST 1 VIEW COMPARISON:  08/20/2019 FINDINGS: 0426 hours. Cardiopericardial silhouette is at upper limits of normal for size. Small bilateral pleural effusions are again noted with bibasilar collapse/consolidation. The visualized bony structures of the thorax are intact. Telemetry leads overlie the chest. IMPRESSION: Stable exam. Small bilateral pleural effusions with bibasilar  collapse/consolidation. Electronically Signed   By: Misty Stanley M.D.   On: 08/21/2019 07:21   Dg Chest Port 1 View  Result Date: 08/20/2019 CLINICAL DATA:  Shortness of breath EXAM: PORTABLE CHEST 1 VIEW COMPARISON:  Two days ago FINDINGS: Normal heart size. Haziness of the bilateral lower chest from layering pleural fluid with atelectasis by recent CT. No air bronchogram. No pneumothorax. IMPRESSION: Pleural effusions and atelectasis that have increased from 2 days ago. Electronically Signed   By: Monte Fantasia M.D.   On: 08/20/2019 07:33   Dg Chest Port 1 View  Result Date: 08/18/2019 CLINICAL DATA:  Shortness of breath EXAM: PORTABLE CHEST 1 VIEW COMPARISON:  08/18/2019 FINDINGS: Heart is normal size. Bilateral perihilar and lower lobe opacities. No visible effusions. No acute bony abnormality. IMPRESSION: Perihilar and lower lobe opacities could reflect atelectasis, edema or infection. Electronically Signed   By: Rolm Baptise M.D.   On: 08/18/2019 21:19   Dg Chest Port 1 View  Result Date: 08/18/2019 CLINICAL DATA:  Dyspnea. EXAM: PORTABLE CHEST 1 VIEW COMPARISON:  August 17, 2019. FINDINGS: Stable cardiomediastinal silhouette. No pneumothorax is noted. Mild bibasilar atelectasis or edema is noted with probable small pleural effusions. Bony thorax is unremarkable. IMPRESSION: Mild bibasilar atelectasis or edema is noted with probable small pleural effusions. Electronically Signed   By: Marijo Conception M.D.   On: 08/18/2019 08:27   Ct Bone Marrow Biopsy & Aspiration  Result Date: 08/19/2019 INDICATION: 58 year old male with pancytopenia. He presents for bone marrow biopsy as an inpatient. EXAM: CT GUIDED BONE MARROW ASPIRATION AND CORE BIOPSY Interventional Radiologist:  Criselda Peaches, MD MEDICATIONS: None. ANESTHESIA/SEDATION: 2 mg Versed were administered for anxiolysis. This does not constitute moderate sedation. FLUOROSCOPY TIME:  None. COMPLICATIONS: None immediate. Estimated  blood loss: <25 mL PROCEDURE: Informed written consent was obtained from the patient after a thorough discussion of the procedural risks, benefits and alternatives. All questions were addressed. Maximal Sterile Barrier Technique was utilized including caps, mask, sterile gowns, sterile gloves, sterile drape, hand hygiene and skin antiseptic. A timeout was performed prior to the initiation of the procedure. The patient was positioned prone and non-contrast localization CT was  performed of the pelvis to demonstrate the iliac marrow spaces. Maximal barrier sterile technique utilized including caps, mask, sterile gowns, sterile gloves, large sterile drape, hand hygiene, and betadine prep. Under sterile conditions and local anesthesia, an 11 gauge coaxial bone biopsy needle was advanced into the right iliac marrow space. Needle position was confirmed with CT imaging. Initially, bone marrow aspiration was performed. Next, the 11 gauge outer cannula was utilized to obtain a right iliac bone marrow core biopsy. Needle was removed. Hemostasis was obtained with compression. The patient tolerated the procedure well. Samples were prepared with the cytotechnologist. IMPRESSION: Technically successful CT-guided bone marrow aspiration and biopsy of the right iliac bone. Electronically Signed   By: Jacqulynn Cadet M.D.   On: 08/19/2019 10:38   [place] Core Biopsy (lymph Nodes)  Result Date: 08/18/2019 INDICATION: No known primary, now with extensive lymphadenopathy and splenomegaly worrisome for lymphoma. Please perform ultrasound-guided right inguinal lymph node biopsy for tissue diagnostic purposes. EXAM: ULTRASOUND-GUIDED RIGHT INGUINAL LYMPH NODE BIOPSY COMPARISON:  Chest CT-08/17/2019; CT abdomen and pelvis-earlier same day MEDICATIONS: None ANESTHESIA/SEDATION: None COMPLICATIONS: None immediate. TECHNIQUE: Informed written consent was obtained from the patient after a discussion of the risks, benefits and alternatives to  treatment. Questions regarding the procedure were encouraged and answered. Initial ultrasound scanning demonstrated 2 adjacent pathologically enlarged right inguinal lymph nodes compatible with the findings seen on preceding abdominal CT. The dominant approximately 2.7 x 1.6 cm right inguinal lymph node was targeted for biopsy given location and sonographic window (image 7). An ultrasound image was saved for documentation purposes. The procedure was planned. A timeout was performed prior to the initiation of the procedure. The operative was prepped and draped in the usual sterile fashion, and a sterile drape was applied covering the operative field. A timeout was performed prior to the initiation of the procedure. Local anesthesia was provided with 1% lidocaine with epinephrine. Under direct ultrasound guidance, an 18 gauge core needle device was utilized to obtain to obtain 6 core needle biopsies of the dominant right inguinal lymph node. The samples were placed in saline and submitted to pathology. The needle was removed and hemostasis was achieved with manual compression. Post procedure scan was negative for significant hematoma. A dressing was placed. The patient tolerated the procedure well without immediate postprocedural complication. IMPRESSION: Technically successful ultrasound guided core needle biopsy of dominant right inguinal lymph node. Electronically Signed   By: Sandi Mariscal M.D.   On: 08/18/2019 13:39   Vas [place] Lower Extremity Venous (dvt)  Result Date: 08/18/2019  Lower Venous Study Indications: Elevated d dimer.  Comparison Study: no prior Performing Technologist: Abram Sander RVS  Examination Guidelines: A complete evaluation includes B-mode imaging, spectral Doppler, color Doppler, and power Doppler as needed of all accessible portions of each vessel. Bilateral testing is considered an integral part of a complete examination. Limited examinations for reoccurring indications may be performed  as noted.  +---------+---------------+---------+-----------+----------+--------------+ RIGHT    CompressibilityPhasicitySpontaneityPropertiesThrombus Aging +---------+---------------+---------+-----------+----------+--------------+ CFV      Full           Yes      Yes                                 +---------+---------------+---------+-----------+----------+--------------+ SFJ      Full                                                        +---------+---------------+---------+-----------+----------+--------------+  FV Prox  Full                                                        +---------+---------------+---------+-----------+----------+--------------+ FV Mid   Full                                                        +---------+---------------+---------+-----------+----------+--------------+ FV DistalFull                                                        +---------+---------------+---------+-----------+----------+--------------+ PFV      Full                                                        +---------+---------------+---------+-----------+----------+--------------+ POP      Full           Yes      Yes                                 +---------+---------------+---------+-----------+----------+--------------+ PTV      Full                                                        +---------+---------------+---------+-----------+----------+--------------+ PERO     Full                                                        +---------+---------------+---------+-----------+----------+--------------+   +---------+---------------+---------+-----------+----------+--------------+ LEFT     CompressibilityPhasicitySpontaneityPropertiesThrombus Aging +---------+---------------+---------+-----------+----------+--------------+ CFV      Full           Yes      Yes                                  +---------+---------------+---------+-----------+----------+--------------+ SFJ      Full                                                        +---------+---------------+---------+-----------+----------+--------------+ FV Prox  Full                                                        +---------+---------------+---------+-----------+----------+--------------+  FV Mid   Full                                                        +---------+---------------+---------+-----------+----------+--------------+ FV DistalFull                                                        +---------+---------------+---------+-----------+----------+--------------+ PFV      Full                                                        +---------+---------------+---------+-----------+----------+--------------+ POP      Full           Yes      Yes                                 +---------+---------------+---------+-----------+----------+--------------+ PTV      Full                                                        +---------+---------------+---------+-----------+----------+--------------+ PERO     Full                                                        +---------+---------------+---------+-----------+----------+--------------+     Summary: Right: There is no evidence of deep vein thrombosis in the lower extremity. No cystic structure found in the popliteal fossa. Left: There is no evidence of deep vein thrombosis in the lower extremity. No cystic structure found in the popliteal fossa.  *See table(s) above for measurements and observations. Electronically signed by Harold Barban MD on 08/18/2019 at 1:53:01 PM.    Final    US Thoracentesis Asp Pleural Space W/img Guide  Result Date: 08/24/2019 INDICATION: Patient with shortness of breath, left pleural effusion. Request made for diagnostic and therapeutic left thoracentesis. EXAM: ULTRASOUND GUIDED DIAGNOSTIC AND  THERAPEUTIC LEFT THORACENTESIS MEDICATIONS: 10 mL 1% lidocaine COMPLICATIONS: None immediate. PROCEDURE: An ultrasound guided thoracentesis was thoroughly discussed with the patient and questions answered. The benefits, risks, alternatives and complications were also discussed. The patient understands and wishes to proceed with the procedure. Written consent was obtained. Ultrasound was performed to localize and mark an adequate pocket of fluid in the left chest. The area was then prepped and draped in the normal sterile fashion. 1% Lidocaine was used for local anesthesia. Under ultrasound guidance a 6 Fr Safe-T-Centesis catheter was introduced. Thoracentesis was performed. The catheter was removed and a dressing applied. FINDINGS: A total of approximately 550 mL of yellow fluid was removed. Samples were sent to the laboratory as requested by the clinical team. IMPRESSION:  Successful ultrasound guided left thoracentesis yielding 550 mL of pleural fluid. Read by: Brynda Greathouse PA-C Electronically Signed   By: Jerilynn Mages.  Shick M.D.   On: 08/22/2019 16:59    ASSESSMENT AND PLAN: 1.    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  The patient presented with bulky lymphadenopathy. 2.  Pancytopenia 3.  Lactic acidosis/sepsis, resolved 4.  Dyspnea secondary to left greater than right pleural effusions, resolved 5.  Hyponatremia, improved 6.  Anxiety 7.  Chronic back pain 8.  Tobacco dependence  -Right inguinal lymph node biopsy consistent with non-Hodgkin B-cell lymphoma.  Bone marrow biopsy most consistent with chronic lymphocytic leukemia/small lymphocytic lymphoma-fish (B-cell panel) is pending, and pleural fluid showed atypical lymphocytes suspicious for lymphoproliferative process.  Findings have been discussed.  Recommend rituximab ministration as an inpatient.  Adverse effects of this medication have been discussed including but not limited to nausea, vomiting, infusion reaction.  Unfortunately,  the patient had an infusion reaction to Rituxan and was unable to complete his dose. From our standpoint, the patient may discharge to home when otherwise medically stable. Will plan for chemotherapy as an outpatient. Echocardiogram was obtained on 08/18/2019 and showed LVEF of 60-65% -The patient is at high risk for tumor lysis syndrome due to bulky lymphadenopathy. Continue Allopurinol 300 mg daily.  -The patient presented with pancytopenia likely due to bone marrow involvement of his lymphoma. No labs ordered for today. Recommend PRBC transfusion for hemoglobin less than 8.  Recommend platelet transfusion if platelets drop below 20,000 or active bleeding.   -He was counseled about smoking cessation.  Will arrange for outpatient [facility] at the [facility] for Mr. Adkison once discharge date known.    LOS: 10 days   Mikey Bussing, DNP, AGPCNP-BC, AOCNP 08/27/19   ADDENDUM: Hematology/Oncology Attending: I had a face-to-face encounter with the patient today.  I recommended his care plan and agree with the above note.  This is a very pleasant 58 years old white male who was recently diagnosed with non-Hodgkin lymphoma/small lymphocytic lymphoma presented with bulky lymphadenopathy as well as splenomegaly and pancytopenia.  The patient also had sepsis with his initial admission which is significantly improved. He was tried 2 days ago on the first dose of Rituxan but has significant reaction with anxiety as well as some difficulty breathing and hypotension.  His treatment was discontinued. He is feeling much better today. We will arrange for the patient to have a follow-up appointment with me at the cancer center after discharge for further discussion and treatment of his non-Hodgkin lymphoma. The patient is in agreement with the current plan. Thank you so much for taking good care of Mr. Dykema, I will continue to follow-up the patient with you and assist in his management on as-needed  basis.  Disclaimer: This note was dictated with voice recognition software. Similar sounding words can inadvertently be transcribed and may be missed upon review. Eilleen Kempf, MD

## 2019-08-27 NOTE — Progress Notes (Signed)
PROGRESS NOTE    Isaac Pierce  [id number] DOB: [birth year] DOA: 08/17/2019 PCP: Patient, No Pcp Per ( Brief Narrative.  Patient is much more comfortable today.  He feels more energetic appetite has improved he has gotten up in bed and has not had any significant problems with his balance.  Bowel habit has improved.    Assessment & Plan:   Principal Problem:   Severe sepsis ([place]) Active Problems:   Malignancy (HCC)   Pancytopenia (HCC)   Dyspnea   Pleural effusion   Hyponatremia   Protein-calorie malnutrition, severe   Non-Hodgkin lymphoma (HCC)   Pressure injury of skin Will update labs, ambulate ad lib. discussed with discharge planning and plan discharge if stable by tomorrow.   DVT prophylaxis: Anticoagulation contraindicated Code Status: (Full/Partial - specify details) Family Communication: Will discuss with his sister Disposition Plan: Stable may be discharged tomorrow   Consultants:   Oncologist  Procedures: ( Antimicrobials:     Subjective: As noted in the opening note. Note chest or abdominal pain no shortness of breath  Objective: Vitals:   08/27/19 0400 08/27/19 0500 08/27/19 0600 08/27/19 0733  BP:   (!) 111/59   Pulse:   (!) 120   Resp:   18   Temp: 98.3 F (36.8 C)   98.7 F (37.1 C)  TempSrc: Oral   Oral  SpO2:   94% 93%  Weight:  65 kg    Height:        Intake/Output Summary (Last 24 hours) at 08/27/2019 0909 Last data filed at 08/27/2019 0616 Gross per 24 hour  Intake 359.56 ml  Output 1375 ml  Net -1015.44 ml   Filed Weights   08/25/19 2215 08/26/19 0500 08/27/19 0500  Weight: 66.6 kg 65 kg 65 kg    Examination:  General exam: Appears calm and comfortable  Respiratory system: Clear to auscultation.  Prolonged expiratory. Cardiovascular system: Regular rhythm no JVD Gastrointestinal system: Tense but nontender.  No prominent organomegaly.  Sounds present Central nervous system: Alert and oriented. No focal neurological  deficits. Extremities: Symmetric pedal pulses are diminished Skin: No rashes, lesions or ulcers Psychiatry: Judgement and insight appear normal. Mood & affect appropriate.     Data Reviewed: Will update labs  CBC: Recent Labs  Lab 08/22/19 0531 08/23/19 0502 08/24/19 0545 08/25/19 0756 08/26/19 0223 08/26/19 1123  WBC 5.2 4.9 5.2 5.0 4.7 4.9  NEUTROABS 1.6* 1.2* 1.4* 1.7 2.3  --   HGB 8.1* 7.3* 7.9* 7.5* 7.2* 7.8*  HCT 25.0* 22.8* 23.9* 23.6* 23.2* 23.9*  MCV 86.2 86.4 86.6 88.1 89.6 87.9  PLT 42* 46* 46* 53* 43* 52*   Basic Metabolic Panel: Recent Labs  Lab 08/21/19 0539 08/22/19 0531 08/23/19 0502 08/24/19 0545 08/25/19 0530 08/26/19 0223 08/26/19 1123  NA 134* 132* 133* 134* 134* 133* 134*  K 3.8 3.8 3.1* 3.7 4.4 4.2 4.5  CL 97* 93* 95* 97* 95* 94* 95*  CO2 24 27 25 26 26 25 22   GLUCOSE 80 89 127* 90 88 144* 257*  BUN 14 25* 25* 27* 26* 31* 33*  CREATININE 0.61 0.71 0.72 0.74 0.72 0.76 1.01  CALCIUM 8.7* 8.7* 8.3* 8.5* 9.5 9.4 9.9  MG 1.5* 1.5* 1.7 1.9  --   --   --   PHOS 3.4 3.5 3.0 2.9  --   --   --    GFR: Estimated Creatinine Clearance: 75.1 mL/min (by C-G formula based on SCr of 1.01 mg/dL). Liver Function Tests: Recent Labs  Lab 08/23/19 0502 08/24/19 0545 08/25/19 0530 08/26/19 0223 08/26/19 1123  AST 50* 68* 61* 64* 67*  ALT 21 27 26 28  32  ALKPHOS 174* 257* 259* 229* 230*  BILITOT 0.7 1.0 0.8 0.9 1.0  PROT 4.3* 4.6* 4.6* 4.6* 4.9*  ALBUMIN 1.7* 1.9* 1.8* 1.9* 2.0*   No results for input(s): LIPASE, AMYLASE in the last 168 hours. No results for input(s): AMMONIA in the last 168 hours. Coagulation Profile: No results for input(s): INR, PROTIME in the last 168 hours. Cardiac Enzymes: No results for input(s): CKTOTAL, CKMB, CKMBINDEX, TROPONINI in the last 168 hours. BNP (last 3 results) No results for input(s): PROBNP in the last 8760 hours. HbA1C: No results for input(s): HGBA1C in the last 72 hours. CBG: No results for input(s):  GLUCAP in the last 168 hours. Lipid Profile: No results for input(s): CHOL, HDL, LDLCALC, TRIG, CHOLHDL, LDLDIRECT in the last 72 hours. Thyroid Function Tests: No results for input(s): TSH, T4TOTAL, FREET4, T3FREE, THYROIDAB in the last 72 hours. Anemia Panel: No results for input(s): VITAMINB12, FOLATE, FERRITIN, TIBC, IRON, RETICCTPCT in the last 72 hours. Sepsis Labs: No results for input(s): PROCALCITON, LATICACIDVEN in the last 168 hours.  Recent Results (from the past 240 hour(s))  Culture, Urine     Status: None   Collection Time: 08/17/19  7:14 PM   Specimen: Urine, Clean Catch  Result Value Ref Range Status   Specimen Description   Final    URINE, CLEAN CATCH Performed at [facility], [place] [address] [place], [place] [address]    Special Requests   Final    NONE Performed at [facility], [place] [address], [place], [place] [address]    Culture   Final    NO GROWTH Performed at [facility], [place] [address] [place], [place] [address]    Report Status 08/19/2019 FINAL  Final  SARS CORONAVIRUS 2 (TAT 6-24 HRS) Nasopharyngeal Nasopharyngeal Swab     Status: None   Collection Time: 08/17/19  8:52 PM   Specimen: Nasopharyngeal Swab  Result Value Ref Range Status   SARS Coronavirus 2 NEGATIVE NEGATIVE Final    Comment: (NOTE) SARS-CoV-2 target nucleic acids are NOT DETECTED. The SARS-CoV-2 RNA is generally detectable in upper and lower respiratory specimens during the acute phase of infection. Negative results do not preclude SARS-CoV-2 infection, do not rule out co-infections with other pathogens, and should not be used as the sole basis for treatment or other patient management decisions. Negative results must be combined with clinical observations, patient history, and epidemiological information. The expected result is Negative. Fact Sheet for Patients: SugarRoll.be Fact Sheet for Healthcare  Providers: https://www.woods-mathews.com/ This test is not yet approved or cleared by the [place] FDA and  has been authorized for detection and/or diagnosis of SARS-CoV-2 by FDA under an Emergency Use Authorization (EUA). This EUA will remain  in effect (meaning this test can be used) for the duration of the COVID-19 declaration under Section 56 4(b)(1) of the Act, 21 U.S.C. section 360bbb-3(b)(1), unless the authorization is terminated or revoked sooner. Performed at [facility], [place] [address], [place], [place] [address]   Blood Culture (routine x 2)     Status: Abnormal   Collection Time: 08/17/19  8:55 PM   Specimen: BLOOD  Result Value Ref Range Status   Specimen Description   Final    BLOOD LEFT ANTECUBITAL Performed at [place] [address] [place],  [place] [address]    Special Requests   Final    BOTTLES DRAWN AEROBIC AND ANAEROBIC Blood Culture adequate volume Performed at [place] [address] [place], [place] [address]    Culture  Setup Time   Final    GRAM POSITIVE COCCI AEROBIC BOTTLE ONLY CRITICAL RESULT CALLED TO, READ BACK BY AND VERIFIED WITH: J. [place], PHARMD ([place]) AT [id number] ON 08/18/19 BY C. JESSUP, MT.    Culture (A)  Final    STAPHYLOCOCCUS SPECIES (COAGULASE NEGATIVE) THE SIGNIFICANCE OF ISOLATING THIS ORGANISM FROM A SINGLE SET OF BLOOD CULTURES WHEN MULTIPLE SETS ARE DRAWN IS UNCERTAIN. PLEASE NOTIFY THE MICROBIOLOGY DEPARTMENT WITHIN ONE WEEK IF SPECIATION AND SENSITIVITIES ARE REQUIRED. Performed at [facility], [place] [address], [place], [place] [address]    Report Status 08/19/2019 FINAL  Final  Blood Culture (routine x 2)     Status: None   Collection Time: 08/17/19  8:55 PM   Specimen: BLOOD  Result Value Ref Range Status   Specimen Description   Final    BLOOD RIGHT ANTECUBITAL Performed at [place] [address] [place], [place]  [address]    Special Requests   Final    BOTTLES DRAWN AEROBIC AND ANAEROBIC Blood Culture results may not be optimal due to an excessive volume of blood received in culture bottles Performed at [place] [address], [place], [place] [address]    Culture   Final    NO GROWTH 5 DAYS Performed at [facility], [place] [address], [place], [place] [address]    Report Status 08/22/2019 FINAL  Final  MRSA PCR Screening     Status: None   Collection Time: 08/18/19 12:22 AM   Specimen: Nasal Mucosa; Nasopharyngeal  Result Value Ref Range Status   MRSA by PCR NEGATIVE NEGATIVE Final    Comment:        The GeneXpert MRSA Assay (FDA approved for NASAL specimens only), is one component of a comprehensive MRSA colonization surveillance program. It is not intended to diagnose MRSA infection nor to guide or monitor treatment for MRSA infections. Performed at [facility], [place] [address] [place], [place] [address]   Culture, blood (routine x 2)     Status: None   Collection Time: 08/19/19  5:48 PM   Specimen: BLOOD RIGHT HAND  Result Value Ref Range Status   Specimen Description   Final    BLOOD RIGHT HAND Performed at [facility], [place] [address] [place], [place] [address]    Special Requests   Final    BOTTLES DRAWN AEROBIC ONLY Blood Culture adequate volume Performed at [place] [address] [place], [place] [address]    Culture   Final    NO GROWTH 5 DAYS Performed at [facility], [place] [address] [place], [place] [address]    Report Status 08/24/2019 FINAL  Final  Culture, blood (routine x 2)     Status: None   Collection Time: 08/19/19  5:48 PM   Specimen: BLOOD LEFT HAND  Result Value Ref Range Status   Specimen Description   Final    BLOOD LEFT HAND Performed at [facility], [place] [address], [place],  [address]    Special Requests   Final    BOTTLES DRAWN AEROBIC ONLY Blood  Culture adequate volume Performed at [place] [address] [place],  [place] [address]    Culture   Final    NO GROWTH 5 DAYS Performed at [facility], [place] [address], [place], [place] [address]    Report Status 08/24/2019 FINAL  Final  Body fluid culture     Status: None   Collection Time: 08/22/19  4:00 PM   Specimen: PATH Cytology Pleural fluid  Result Value Ref Range Status   Specimen Description   Final    PLEURAL Performed at [place] [address] [place], [place] [address]    Special Requests   Final    NONE Performed at [facility], [place] [address], [place], [place] [address]    Gram Stain   Final    ABUNDANT WBC PRESENT, PREDOMINANTLY MONONUCLEAR NO ORGANISMS SEEN    Culture   Final    NO GROWTH 3 DAYS Performed at [place] [address] [place], [place] [address]    Report Status 08/26/2019 FINAL  Final  Acid Fast Smear (AFB)     Status: None   Collection Time: 08/22/19  4:00 PM   Specimen: PATH Cytology Pleural fluid  Result Value Ref Range Status   AFB Specimen Processing Concentration  Final   Acid Fast Smear Negative  Final    Comment: (NOTE) Performed At: [facility] [place], [place] [id number] Rush Farmer MD [id number]    Source (AFB) PLEURAL  Final    Comment: Performed at [facility], [place] [address], [place], [place] [address]  MRSA PCR Screening     Status: None   Collection Time: 08/25/19 10:03 PM   Specimen: Nasal Mucosa; Nasopharyngeal  Result Value Ref Range Status   MRSA by PCR NEGATIVE NEGATIVE Final    Comment:        The GeneXpert MRSA Assay (FDA approved for NASAL specimens only), is one component of a comprehensive MRSA colonization surveillance program. It is not intended to diagnose MRSA infection nor to guide or monitor treatment for MRSA infections. Performed at [facility], [place] [address], [place], [place] [address]          Radiology Studies: Ct Angio Chest Pe W Or Wo Contrast  Result Date: 08/25/2019 CLINICAL DATA:  Tachycardia. 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 EXAM: CT ANGIOGRAPHY CHEST WITH CONTRAST TECHNIQUE: Multidetector CT imaging of the chest was performed using the standard protocol during bolus administration of intravenous contrast. Multiplanar CT image reconstructions and MIPs were obtained to evaluate the vascular anatomy. CONTRAST:  160mL OMNIPAQUE IOHEXOL 350 MG/ML SOLN COMPARISON:  Chest radiograph dated 08/23/2019. CTA chest dated 08/17/2019. FINDINGS: Cardiovascular: Evaluation is constrained by respiratory motion, particularly in the bilateral lower lobes. Within that constraint, there is no evidence of pulmonary embolism to the segmental level. No evidence of thoracic aortic aneurysm or dissection. The heart is normal in size. Moderate pericardial soft tissue/metastases inferiorly (series 4/image 73), unchanged, likely related to the patient's known lymphoproliferative disorder. Mediastinum/Nodes: Bulky thoracic lymphadenopathy, related to the patient's known lymphoproliferative disorder, including: --2.3 cm short axis high right paratracheal node (series 4/image 28) --3.0 cm short axis AP window node (series 4/image 35) --1.4 cm short axis right hilar node (series 4/image 42) --2.6 cm short axis subcarinal node (series 4/image 43) --2.1 cm short axis intralobar/right lower lobe node (series 4/image 55) Visualized thyroid is unremarkable. Lungs/Pleura: Small bilateral pulmonary nodules, most of which are pleural/subpleural, unchanged from recent CT, including a dominant 9 mm nodule in  the posterior right lower lobe (series 6/image 36). Dominant central pulmonary nodule measures 8 mm in the left lower lobe (series 6/image 78). These findings are likely related to the patient's known lymphoma. Moderate left pleural effusion, stable versus  mildly increased, partially loculated. Trace right pleural effusion, mildly decreased. Associated bilateral lower lobe atelectasis. No pneumothorax. Upper Abdomen: Visualized upper abdomen is notable for a small hiatal hernia, masses splenomegaly, and a splenic infarct, better evaluated on recent CT abdomen/pelvis. Musculoskeletal: Degenerative changes of the visualized thoracolumbar spine. Review of the MIP images confirms the above findings. IMPRESSION: No evidence of pulmonary embolism. Bulky thoracic lymphadenopathy, pericardial thickening/soft tissue, and bilateral pulmonary nodules, likely related to the patient's known B-cell lymphoma, grossly unchanged. Moderate left pleural effusion, stable versus mildly increased, partially loculated. Trace right pleural effusion, mildly decreased. Associated bilateral lower lobe atelectasis. Overall, there is no significant interval change from recent CTs. Electronically Signed   By: Julian Hy M.D.   On: 08/25/2019 22:00        Scheduled Meds:  sodium chloride   Intravenous Once   sodium chloride   Intravenous Once   allopurinol  300 mg Oral Daily   Chlorhexidine Gluconate Cloth  6 each Topical Daily   feeding supplement (ENSURE ENLIVE)  237 mL Oral BID BM   feeding supplement (PRO-STAT SUGAR FREE 64)  30 mL Oral BID   folic acid  1 mg Oral Daily   furosemide  40 mg Intravenous Daily   hydrocortisone   Rectal TID   ipratropium  0.5 mg Nebulization BID   levalbuterol  0.63 mg Nebulization BID   mouth rinse  15 mL Mouth Rinse BID   multivitamin with minerals  1 tablet Oral Daily   nicotine  7 mg Transdermal Daily   nystatin  5 mL Mouth/Throat QID   pantoprazole  20 mg Oral Daily   Continuous Infusions:  sodium chloride     sodium chloride 20 mL/hr at 08/25/19 1336   sodium chloride       LOS: 10 days    Time spent: 35 minutes    Pearlean Brownie, MD [facility] Pager [phone]  If 7PM-7AM, please  contact night-coverage [email or web address] Password [email or web address] 08/27/2019, 9:09 AM   PROGRESS NOTE    Isaac [place]  [id number] DOB: [birth year] DOA: 08/17/2019 PCP: Patient, No Pcp Per   Brief Narrative: This patient is feeling much better.  He has gotten up and has felt somewhat weak greasy he denies any pain in the chest abdomen and no shortness of breath.    Assessment & Plan:   Principal Problem:   Severe sepsis ([place]) Active Problems:   Malignancy (HCC)   Pancytopenia (HCC)   Dyspnea   Pleural effusion   Hyponatremia   Protein-calorie malnutrition, severe   Non-Hodgkin lymphoma (HCC)   Pressure injury of skin    DVT prophylaxis:  Code Status: Full Family Communication: Will discuss with her father Disposition Plan: The next 1-200.   Consultants:  Oncology  Procedures: Antimicrobials:None    Subjective: Noted in the opening Paragraph  Objective: Vitals:   08/27/19 0400 08/27/19 0500 08/27/19 0600 08/27/19 0733  BP:   (!) 111/59   Pulse:   (!) 120   Resp:   18   Temp: 98.3 F (36.8 C)   98.7 F (37.1 C)  TempSrc: Oral   Oral  SpO2:   94% 93%  Weight:  65 kg    Height:        Intake/Output  Summary (Last 24 hours) at 08/27/2019 0909 Last data filed at 08/27/2019 0616 Gross per 24 hour  Intake 359.56 ml  Output 1375 ml  Net -1015.44 ml   Filed Weights   08/25/19 2215 08/26/19 0500 08/27/19 0500  Weight: 66.6 kg 65 kg 65 kg    Examination:  General exam: Appears calm and comfortable  Respiratory system: Clear to auscultation. Respiratory effort normal. Cardiovascular system: S1 & S2 heard, RRR. No JVD, murmurs, rubs, gallops or clicks. No pedal edema. Gastrointestinal system: Abdomen is nondistended, soft and nontender. No organomegaly or masses felt. Normal bowel sounds heard. Central nervous system: Alert and oriented. No focal neurological deficits. Extremities: Symmetric 5 x 5 power. Skin: No rashes, lesions or ulcers Psychiatry: Judgement and  insight appear normal. Mood & affect appropriate.     Data Reviewed: I have personally reviewed following labs and imaging studies  CBC: Recent Labs  Lab 08/22/19 0531 08/23/19 0502 08/24/19 0545 08/25/19 0756 08/26/19 0223 08/26/19 1123  WBC 5.2 4.9 5.2 5.0 4.7 4.9  NEUTROABS 1.6* 1.2* 1.4* 1.7 2.3  --   HGB 8.1* 7.3* 7.9* 7.5* 7.2* 7.8*  HCT 25.0* 22.8* 23.9* 23.6* 23.2* 23.9*  MCV 86.2 86.4 86.6 88.1 89.6 87.9  PLT 42* 46* 46* 53* 43* 52*   Basic Metabolic Panel: Recent Labs  Lab 08/21/19 0539 08/22/19 0531 08/23/19 0502 08/24/19 0545 08/25/19 0530 08/26/19 0223 08/26/19 1123  NA 134* 132* 133* 134* 134* 133* 134*  K 3.8 3.8 3.1* 3.7 4.4 4.2 4.5  CL 97* 93* 95* 97* 95* 94* 95*  CO2 24 27 25 26 26 25 22   GLUCOSE 80 89 127* 90 88 144* 257*  BUN 14 25* 25* 27* 26* 31* 33*  CREATININE 0.61 0.71 0.72 0.74 0.72 0.76 1.01  CALCIUM 8.7* 8.7* 8.3* 8.5* 9.5 9.4 9.9  MG 1.5* 1.5* 1.7 1.9  --   --   --   PHOS 3.4 3.5 3.0 2.9  --   --   --    GFR: Estimated Creatinine Clearance: 75.1 mL/min (by C-G formula based on SCr of 1.01 mg/dL). Liver Function Tests: Recent Labs  Lab 08/23/19 0502 08/24/19 0545 08/25/19 0530 08/26/19 0223 08/26/19 1123  AST 50* 68* 61* 64* 67*  ALT 21 27 26 28  32  ALKPHOS 174* 257* 259* 229* 230*  BILITOT 0.7 1.0 0.8 0.9 1.0  PROT 4.3* 4.6* 4.6* 4.6* 4.9*  ALBUMIN 1.7* 1.9* 1.8* 1.9* 2.0*   No results for input(s): LIPASE, AMYLASE in the last 168 hours. No results for input(s): AMMONIA in the last 168 hours. Coagulation Profile: No results for input(s): INR, PROTIME in the last 168 hours. Cardiac Enzymes: No results for input(s): CKTOTAL, CKMB, CKMBINDEX, TROPONINI in the last 168 hours. BNP (last 3 results) No results for input(s): PROBNP in the last 8760 hours. HbA1C: No results for input(s): HGBA1C in the last 72 hours. CBG: No results for input(s): GLUCAP in the last 168 hours. Lipid Profile: No results for input(s): CHOL, HDL,  LDLCALC, TRIG, CHOLHDL, LDLDIRECT in the last 72 hours. Thyroid Function Tests: No results for input(s): TSH, T4TOTAL, FREET4, T3FREE, THYROIDAB in the last 72 hours. Anemia Panel: No results for input(s): VITAMINB12, FOLATE, FERRITIN, TIBC, IRON, RETICCTPCT in the last 72 hours. Sepsis Labs: No results for input(s): PROCALCITON, LATICACIDVEN in the last 168 hours.  Recent Results (from the past 240 hour(s))  Culture, Urine     Status: None   Collection Time: 08/17/19  7:14 PM   Specimen:  Urine, Clean Catch  Result Value Ref Range Status   Specimen Description   Final    URINE, CLEAN CATCH Performed at [facility], [place] [address], [place], [place] [address]    Special Requests   Final    NONE Performed at [facility], [place] [address] [place], [place] [address]    Culture   Final    NO GROWTH Performed at [facility], [place] [address] [place], [place] [address]    Report Status 08/19/2019 FINAL  Final  SARS CORONAVIRUS 2 (TAT 6-24 HRS) Nasopharyngeal Nasopharyngeal Swab     Status: None   Collection Time: 08/17/19  8:52 PM   Specimen: Nasopharyngeal Swab  Result Value Ref Range Status   SARS Coronavirus 2 NEGATIVE NEGATIVE Final    Comment: (NOTE) SARS-CoV-2 target nucleic acids are NOT DETECTED. The SARS-CoV-2 RNA is generally detectable in upper and lower respiratory specimens during the acute phase of infection. Negative results do not preclude SARS-CoV-2 infection, do not rule out co-infections with other pathogens, and should not be used as the sole basis for treatment or other patient management decisions. Negative results must be combined with clinical observations, patient history, and epidemiological information. The expected result is Negative. Fact Sheet for Patients: SugarRoll.be Fact Sheet for Healthcare Providers: https://www.woods-mathews.com/ This test is not yet approved  or cleared by the [place] FDA and  has been authorized for detection and/or diagnosis of SARS-CoV-2 by FDA under an Emergency Use Authorization (EUA). This EUA will remain  in effect (meaning this test can be used) for the duration of the COVID-19 declaration under Section 56 4(b)(1) of the Act, 21 U.S.C. section 360bbb-3(b)(1), unless the authorization is terminated or revoked sooner. Performed at [facility], [place] [address], [place], [place] [address]   Blood Culture (routine x 2)     Status: Abnormal   Collection Time: 08/17/19  8:55 PM   Specimen: BLOOD  Result Value Ref Range Status   Specimen Description   Final    BLOOD LEFT ANTECUBITAL Performed at [place] [address], [place], [place] [address]    Special Requests   Final    BOTTLES DRAWN AEROBIC AND ANAEROBIC Blood Culture adequate volume Performed at [place] [address] [place] [address], [place], [place] [address]    Culture  Setup Time   Final    GRAM POSITIVE COCCI AEROBIC BOTTLE ONLY CRITICAL RESULT CALLED TO, READ BACK BY AND VERIFIED WITH: J. [place], PHARMD ([place]) AT [id number] ON 08/18/19 BY C. JESSUP, MT.    Culture (A)  Final    STAPHYLOCOCCUS SPECIES (COAGULASE NEGATIVE) THE SIGNIFICANCE OF ISOLATING THIS ORGANISM FROM A SINGLE SET OF BLOOD CULTURES WHEN MULTIPLE SETS ARE DRAWN IS UNCERTAIN. PLEASE NOTIFY THE MICROBIOLOGY DEPARTMENT WITHIN ONE WEEK IF SPECIATION AND SENSITIVITIES ARE REQUIRED. Performed at [facility], [place] [address], [place], [place] [address]    Report Status 08/19/2019 FINAL  Final  Blood Culture (routine x 2)     Status: None   Collection Time: 08/17/19  8:55 PM   Specimen: BLOOD  Result Value Ref Range Status   Specimen Description   Final    BLOOD RIGHT ANTECUBITAL Performed at [place] [address], [place],  [address]    Special Requests   Final    BOTTLES DRAWN AEROBIC AND ANAEROBIC Blood Culture  results may not be optimal due to an excessive volume of blood  received in culture bottles Performed at [facility], [place] [address] [place], [place] [address]    Culture   Final    NO GROWTH 5 DAYS Performed at [facility], [place] [address], [place], [place] [address]    Report Status 08/22/2019 FINAL  Final  MRSA PCR Screening     Status: None   Collection Time: 08/18/19 12:22 AM   Specimen: Nasal Mucosa; Nasopharyngeal  Result Value Ref Range Status   MRSA by PCR NEGATIVE NEGATIVE Final    Comment:        The GeneXpert MRSA Assay (FDA approved for NASAL specimens only), is one component of a comprehensive MRSA colonization surveillance program. It is not intended to diagnose MRSA infection nor to guide or monitor treatment for MRSA infections. Performed at [facility], [place] [address], [place], [place] [address]   Culture, blood (routine x 2)     Status: None   Collection Time: 08/19/19  5:48 PM   Specimen: BLOOD RIGHT HAND  Result Value Ref Range Status   Specimen Description   Final    BLOOD RIGHT HAND Performed at [facility], [place] [address] [place], [place] [address]    Special Requests   Final    BOTTLES DRAWN AEROBIC ONLY Blood Culture adequate volume Performed at [facility] [address] [place], [place] [address]    Culture   Final    NO GROWTH 5 DAYS Performed at [facility], [place] [address] [place], [place] [address]    Report Status 08/24/2019 FINAL  Final  Culture, blood (routine x 2)     Status: None   Collection Time: 08/19/19  5:48 PM   Specimen: BLOOD LEFT HAND  Result Value Ref Range Status   Specimen Description   Final    BLOOD LEFT HAND Performed at [facility], [place] [address], [place], [place] [address]    Special Requests   Final    BOTTLES DRAWN AEROBIC ONLY Blood Culture adequate volume Performed at [facility] [address] [place], [place] [address]    Culture   Final    NO GROWTH 5 DAYS Performed at [facility], [place] [address] [place], [place] [address]    Report Status 08/24/2019 FINAL  Final  Body fluid culture     Status: None   Collection Time: 08/22/19  4:00 PM   Specimen: PATH Cytology Pleural fluid  Result Value Ref Range Status   Specimen Description   Final    PLEURAL Performed at [facility] [address] [place], [place] [address]    Special Requests   Final    NONE Performed at [facility], [place] [address] [place], [place] [address]    Gram Stain   Final    ABUNDANT WBC PRESENT, PREDOMINANTLY MONONUCLEAR NO ORGANISMS SEEN    Culture   Final    NO GROWTH 3 DAYS Performed at [facility] [address] [place], [place] [address]    Report Status 08/26/2019 FINAL  Final  Acid Fast Smear (AFB)     Status: None   Collection Time: 08/22/19  4:00 PM   Specimen: PATH Cytology Pleural fluid  Result Value Ref Range Status   AFB Specimen Processing Concentration  Final   Acid Fast Smear Negative  Final    Comment: (NOTE) Performed At: [facility] [address]  [address] [place], [place] [id number] Rush Farmer MD [id number]    Source (AFB) PLEURAL  Final    Comment: Performed at [facility], [place] [address] [place], [place] [address]  MRSA PCR Screening     Status: None   Collection Time: 08/25/19 10:03 PM   Specimen: Nasal Mucosa; Nasopharyngeal  Result Value Ref Range Status   MRSA by PCR NEGATIVE NEGATIVE Final    Comment:        The GeneXpert MRSA Assay (FDA approved for NASAL specimens only), is one component of a comprehensive MRSA colonization surveillance program. It is not intended to diagnose MRSA infection nor to guide or monitor treatment for MRSA infections. Performed at [facility], [place] [address] [place], [place] [address]          Radiology  Studies: Ct Angio Chest Pe W Or Wo Contrast  Result Date: 08/25/2019 CLINICAL DATA:  Tachycardia. 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 EXAM: CT ANGIOGRAPHY CHEST WITH CONTRAST TECHNIQUE: Multidetector CT imaging of the chest was performed using the standard protocol during bolus administration of intravenous contrast. Multiplanar CT image reconstructions and MIPs were obtained to evaluate the vascular anatomy. CONTRAST:  165mL OMNIPAQUE IOHEXOL 350 MG/ML SOLN COMPARISON:  Chest radiograph dated 08/23/2019. CTA chest dated 08/17/2019. FINDINGS: Cardiovascular: Evaluation is constrained by respiratory motion, particularly in the bilateral lower lobes. Within that constraint, there is no evidence of pulmonary embolism to the segmental level. No evidence of thoracic aortic aneurysm or dissection. The heart is normal in size. Moderate pericardial soft tissue/metastases inferiorly (series 4/image 73), unchanged, likely related to the patient's known lymphoproliferative disorder. Mediastinum/Nodes: Bulky thoracic lymphadenopathy, related to the patient's known lymphoproliferative disorder, including: --2.3 cm short axis high right paratracheal node (series 4/image 28) --3.0 cm short axis AP window node (series 4/image 35) --1.4 cm short axis right hilar node (series 4/image 42) --2.6 cm short axis subcarinal node (series 4/image 43) --2.1 cm short axis intralobar/right lower lobe node (series 4/image 55) Visualized thyroid is unremarkable. Lungs/Pleura: Small bilateral pulmonary nodules, most of which are pleural/subpleural, unchanged from recent CT, including a dominant 9 mm nodule in the posterior right lower lobe (series 6/image 36). Dominant central pulmonary nodule measures 8 mm in the left lower lobe (series 6/image 78). These findings are likely related to the patient's known lymphoma. Moderate left pleural effusion, stable versus mildly increased, partially loculated. Trace right pleural effusion, mildly  decreased. Associated bilateral lower lobe atelectasis. No pneumothorax. Upper Abdomen: Visualized upper abdomen is notable for a small hiatal hernia, masses splenomegaly, and a splenic infarct, better evaluated on recent CT abdomen/pelvis. Musculoskeletal: Degenerative changes of the visualized thoracolumbar spine. Review of the MIP images confirms the above findings. IMPRESSION: No evidence of pulmonary embolism. Bulky thoracic lymphadenopathy, pericardial thickening/soft tissue, and bilateral pulmonary nodules, likely related to the patient's known B-cell lymphoma, grossly unchanged. Moderate left pleural effusion, stable versus mildly increased, partially loculated. Trace right pleural effusion, mildly decreased. Associated bilateral lower lobe atelectasis. Overall, there is no significant interval change from recent CTs. Electronically Signed   By: Julian Hy M.D.   On: 08/25/2019 22:00        Scheduled Meds:  sodium chloride   Intravenous Once   sodium chloride   Intravenous Once   allopurinol  300 mg Oral Daily   Chlorhexidine Gluconate Cloth  6 each Topical Daily   feeding supplement (ENSURE ENLIVE)  237 mL Oral BID BM   feeding supplement (PRO-STAT SUGAR FREE 64)  30  mL Oral BID   folic acid  1 mg Oral Daily   furosemide  40 mg Intravenous Daily   hydrocortisone   Rectal TID   ipratropium  0.5 mg Nebulization BID   levalbuterol  0.63 mg Nebulization BID   mouth rinse  15 mL Mouth Rinse BID   multivitamin with minerals  1 tablet Oral Daily   nicotine  7 mg Transdermal Daily   nystatin  5 mL Mouth/Throat QID   pantoprazole  20 mg Oral Daily   Continuous Infusions:  sodium chloride     sodium chloride 20 mL/hr at 08/25/19 1336   sodium chloride       LOS: 10 days    Time spent: [place], MD [facility]  If 7PM-7AM, please contact night-coverage [email or web address] Password [email or web address] 08/27/2019, 9:09 AM

## 2019-08-28 LAB — CBC
HCT: 22.1 % — ABNORMAL LOW (ref 39.0–52.0)
HCT: 24.6 % — ABNORMAL LOW (ref 39.0–52.0)
Hemoglobin: 7.3 g/dL — ABNORMAL LOW (ref 13.0–17.0)
Hemoglobin: 8.2 g/dL — ABNORMAL LOW (ref 13.0–17.0)
MCH: 29 pg (ref 26.0–34.0)
MCH: 29.4 pg (ref 26.0–34.0)
MCHC: 33 g/dL (ref 30.0–36.0)
MCHC: 33.3 g/dL (ref 30.0–36.0)
MCV: 87.7 fL (ref 80.0–100.0)
MCV: 88.2 fL (ref 80.0–100.0)
Platelets: 50 10*3/uL — ABNORMAL LOW (ref 150–400)
Platelets: 54 10*3/uL — ABNORMAL LOW (ref 150–400)
RBC: 2.52 MIL/uL — ABNORMAL LOW (ref 4.22–5.81)
RBC: 2.79 MIL/uL — ABNORMAL LOW (ref 4.22–5.81)
RDW: 20.7 % — ABNORMAL HIGH (ref 11.5–15.5)
RDW: 19.9 % — ABNORMAL HIGH (ref 11.5–15.5)
WBC: 4.6 10*3/uL (ref 4.0–10.5)
WBC: 5.4 10*3/uL (ref 4.0–10.5)
nRBC: 0 % (ref 0.0–0.2)
nRBC: 0 % (ref 0.0–0.2)

## 2019-08-28 LAB — PREPARE RBC (CROSSMATCH)

## 2019-08-28 LAB — COMPREHENSIVE METABOLIC PANEL
ALT: 44 U/L (ref 0–44)
AST: 82 U/L — ABNORMAL HIGH (ref 15–41)
Albumin: 1.7 g/dL — ABNORMAL LOW (ref 3.5–5.0)
Alkaline Phosphatase: 249 U/L — ABNORMAL HIGH (ref 38–126)
Anion gap: 13 (ref 5–15)
BUN: 23 mg/dL — ABNORMAL HIGH (ref 6–20)
CO2: 25 mmol/L (ref 22–32)
Calcium: 10.1 mg/dL (ref 8.9–10.3)
Chloride: 93 mmol/L — ABNORMAL LOW (ref 98–111)
Creatinine, Ser: 0.73 mg/dL (ref 0.61–1.24)
GFR calc Af Amer: >60 mL/min (ref >60)
GFR calc non Af Amer: >60 mL/min (ref >60)
Glucose, Bld: 74 mg/dL (ref 70–99)
Potassium: 4.4 mmol/L (ref 3.5–5.1)
Sodium: 131 mmol/L — ABNORMAL LOW (ref 135–145)
Total Bilirubin: 0.8 mg/dL (ref 0.3–1.2)
Total Protein: 4.4 g/dL — ABNORMAL LOW (ref 6.5–8.1)

## 2019-08-28 MED ORDER — POTASSIUM CHLORIDE IN NACL 20-0.9 MEQ/L-% IV SOLN
INTRAVENOUS | Status: DC
  Administered 2019-08-28 – 2019-08-30 (×5): via INTRAVENOUS
  Filled 2019-08-28 (×8): qty 1000

## 2019-08-28 MED ORDER — IPRATROPIUM BROMIDE 0.02 % IN SOLN
0.5000 mg | Freq: Four times a day (QID) | RESPIRATORY_TRACT | Status: DC | PRN
  Administered 2019-09-06: 0.5 mg via RESPIRATORY_TRACT
  Filled 2019-08-28: qty 2.5

## 2019-08-28 MED ORDER — SODIUM CHLORIDE 0.9% IV SOLUTION
Freq: Once | INTRAVENOUS | Status: AC
  Administered 2019-08-28: 15:00:00 via INTRAVENOUS

## 2019-08-28 MED ORDER — SODIUM CHLORIDE 0.9% IV SOLUTION: Freq: Once | INTRAVENOUS | Status: AC

## 2019-08-28 NOTE — Progress Notes (Signed)
Pt refuses a bath, he states he will wait til he transfers upstairs so he can take a shower.

## 2019-08-28 NOTE — Progress Notes (Signed)
PROGRESS NOTE    Isaac Pierce  [id number] DOB: April 04, 1961 DOA: 08/17/2019 PCP: Patient, No Pcp Per  Brief Narrative: He has had a good appetite has not had any fever chills breathing difficulty or pain.  He has been able to get up in the room.  Has not felt excessively weak   Assessment & Plan:   Principal Problem:   Severe sepsis ([place]) Active Problems:   Malignancy (HCC)   Pancytopenia (HCC)   Dyspnea   Pleural effusion   Hyponatremia   Protein-calorie malnutrition, severe   Non-Hodgkin lymphoma (HCC)   Pressure injury of skin   Sinus tachycardia unexplained   Dehydration with negative fluid balance Support labs. Tachycardia due to anemia and dehydration.  Another unit of blood and IV fluids.  Continued monitoring of H&H.  Optimize medications.  DVT prophylaxis: Contraindicated Code Status: Full Family Communication: (None so far disposition Plan:    Consultants: Oncology  Procedures:   Antimicrobials: None  Subjective: No new symptoms.  Noted in the opening    Objective: Vitals:   08/28/19 0411 08/28/19 0500 08/28/19 0755 08/28/19 0846  BP:      Pulse:  (!) 118 (!) 123   Resp:  (!) 23 (!) 22   Temp:    98.7 F (37.1 C)  TempSrc:    Oral  SpO2:  92% 91%   Weight: 67.6 kg     Height:      Current heart rate is in the 130s sinus rhythm is noted  Intake/Output Summary (Last 24 hours) at 08/28/2019 1050 Last data filed at 08/28/2019 0400 Gross per 24 hour  Intake --  Output 2300 ml  Net -2300 ml  Net negative balance Filed Weights   08/26/19 0500 08/27/19 0500 08/28/19 0411  Weight: 65 kg 65 kg 67.6 kg    Examination: Is calm and comfortable.  Had a good breakfast. Chest reveals diminished breath sounds in lower lung fields. Abdomen is nontender bowel sounds are active. Extremities reveal no edema.  Pulses are diminished. Alert oriented insightful.  Nonfocal gross motor and sensory exam.. Overall personal hygiene is marginal Data Reviewed:  Hemoglobin has come down to 7.3.  Absolute is now 2.3 and is normal range.  Blood sugar fluctuates with oral intake CBC: Recent Labs  Lab 08/22/19 0531 08/23/19 0502 08/24/19 0545 08/25/19 0756 08/26/19 0223 08/26/19 1123 08/28/19 0601  WBC 5.2 4.9 5.2 5.0 4.7 4.9 4.6  NEUTROABS 1.6* 1.2* 1.4* 1.7 2.3  --   --   HGB 8.1* 7.3* 7.9* 7.5* 7.2* 7.8* 7.3*  HCT 25.0* 22.8* 23.9* 23.6* 23.2* 23.9* 22.1*  MCV 86.2 86.4 86.6 88.1 89.6 87.9 87.7  PLT 42* 46* 46* 53* 43* 52* 50*   Basic Metabolic Panel: Recent Labs  Lab 08/22/19 0531 08/23/19 0502 08/24/19 0545 08/25/19 0530 08/26/19 0223 08/26/19 1123 08/28/19 0601  NA 132* 133* 134* 134* 133* 134* 131*  K 3.8 3.1* 3.7 4.4 4.2 4.5 4.4  CL 93* 95* 97* 95* 94* 95* 93*  CO2 27 25 26 26 25 22 25   GLUCOSE 89 127* 90 88 144* 257* 74  BUN 25* 25* 27* 26* 31* 33* 23*  CREATININE 0.71 0.72 0.74 0.72 0.76 1.01 0.73  CALCIUM 8.7* 8.3* 8.5* 9.5 9.4 9.9 10.1  MG 1.5* 1.7 1.9  --   --   --   --   PHOS 3.5 3.0 2.9  --   --   --   --    GFR: Estimated Creatinine Clearance: 98.6  mL/min (by C-G formula based on SCr of 0.73 mg/dL). Liver Function Tests: Recent Labs  Lab 08/24/19 0545 08/25/19 0530 08/26/19 0223 08/26/19 1123 08/28/19 0601  AST 68* 61* 64* 67* 82*  ALT 27 26 28  32 44  ALKPHOS 257* 259* 229* 230* 249*  BILITOT 1.0 0.8 0.9 1.0 0.8  PROT 4.6* 4.6* 4.6* 4.9* 4.4*  ALBUMIN 1.9* 1.8* 1.9* 2.0* 1.7*   No results for input(s): LIPASE, AMYLASE in the last 168 hours. No results for input(s): AMMONIA in the last 168 hours. Coagulation Profile: No results for input(s): INR, PROTIME in the last 168 hours. Cardiac Enzymes: No results for input(s): CKTOTAL, CKMB, CKMBINDEX, TROPONINI in the last 168 hours. BNP (last 3 results) No results for input(s): PROBNP in the last 8760 hours. HbA1C: No results for input(s): HGBA1C in the last 72 hours. CBG: No results for input(s): GLUCAP in the last 168 hours. Lipid Profile: No results  for input(s): CHOL, HDL, LDLCALC, TRIG, CHOLHDL, LDLDIRECT in the last 72 hours. Thyroid Function Tests: No results for input(s): TSH, T4TOTAL, FREET4, T3FREE, THYROIDAB in the last 72 hours. Anemia Panel: No results for input(s): VITAMINB12, FOLATE, FERRITIN, TIBC, IRON, RETICCTPCT in the last 72 hours. Sepsis Labs: No results for input(s): PROCALCITON, LATICACIDVEN in the last 168 hours.  Recent Results (from the past 240 hour(s))  Culture, blood (routine x 2)     Status: None   Collection Time: 08/19/19  5:48 PM   Specimen: BLOOD RIGHT HAND  Result Value Ref Range Status   Specimen Description   Final    BLOOD RIGHT HAND Performed at [facility], [address] [place], [place] [address]    Special Requests   Final    BOTTLES DRAWN AEROBIC ONLY Blood Culture adequate volume Performed at [facility] [address] [place], [place] [address]    Culture   Final    NO GROWTH 5 DAYS Performed at [facility], [place] [address], [place], [place] [address]    Report Status 08/24/2019 FINAL  Final  Culture, blood (routine x 2)     Status: None   Collection Time: 08/19/19  5:48 PM   Specimen: BLOOD LEFT HAND  Result Value Ref Range Status   Specimen Description   Final    BLOOD LEFT HAND Performed at [facility], [place] [address], [place], [place] [address]    Special Requests   Final    BOTTLES DRAWN AEROBIC ONLY Blood Culture adequate volume Performed at [facility] [address] [place], [place] [address]    Culture   Final    NO GROWTH 5 DAYS Performed at [facility], [place] [address] [place], [place] [address]    Report Status 08/24/2019 FINAL  Final  Body fluid culture     Status: None   Collection Time: 08/22/19  4:00 PM   Specimen: PATH Cytology Pleural fluid  Result Value Ref Range Status   Specimen Description   Final    PLEURAL Performed at [facility] [address]  [place], [place] [address]    Special Requests   Final    NONE Performed at [facility], [place] [address] [place], [place] [address]    Gram Stain   Final    ABUNDANT WBC PRESENT, PREDOMINANTLY MONONUCLEAR NO ORGANISMS SEEN    Culture   Final    NO GROWTH 3 DAYS Performed at [facility], [address] [place], [place] [address]    Report Status 08/26/2019 FINAL  Final  Acid Fast Smear (AFB)     Status: None   Collection Time: 08/22/19  4:00 PM   Specimen: PATH Cytology Pleural fluid  Result Value Ref Range Status   AFB Specimen Processing Concentration  Final   Acid Fast Smear Negative  Final    Comment: (NOTE) Performed At: [facility] [place], [place] [id number] Rush Farmer MD [id number]    Source (AFB) PLEURAL  Final    Comment: Performed at [facility], [place] [address], [place], [place] [address]  MRSA PCR Screening     Status: None   Collection Time: 08/25/19 10:03 PM   Specimen: Nasal Mucosa; Nasopharyngeal  Result Value Ref Range Status   MRSA by PCR NEGATIVE NEGATIVE Final    Comment:        The GeneXpert MRSA Assay (FDA approved for NASAL specimens only), is one component of a comprehensive MRSA colonization surveillance program. It is not intended to diagnose MRSA infection nor to guide or monitor treatment for MRSA infections. Performed at [facility], [place] [address], [place], [place] [address]   Culture, blood (routine x 2)     Status: None (Preliminary result)   Collection Time: 08/26/19  7:49 AM   Specimen: BLOOD RIGHT ARM  Result Value Ref Range Status   Specimen Description   Final    BLOOD RIGHT ARM Performed at [facility] [address], [place], [place] [address]    Special Requests   Final    BOTTLES DRAWN AEROBIC AND ANAEROBIC Blood Culture adequate volume Performed at [facility] [address],  [place], [place] [address]    Culture   Final    NO GROWTH 1 DAY Performed at [facility], [place] [address] [place], [place] [address]    Report Status PENDING  Incomplete  Culture, blood (routine x 2)     Status: None (Preliminary result)   Collection Time: 08/26/19  7:49 AM   Specimen: BLOOD RIGHT ARM  Result Value Ref Range Status   Specimen Description   Final    BLOOD RIGHT ARM Performed at [facility] [address] [place], [place] [address]    Special Requests   Final    BOTTLES DRAWN AEROBIC ONLY Blood Culture adequate volume Performed at [facility] [address] [place], [place] [address]    Culture   Final    NO GROWTH 1 DAY Performed at [facility], [place] [address], [place], [place] [address]    Report Status PENDING  Incomplete         Radiology Studies: No results found.      Scheduled Meds:  sodium chloride   Intravenous Once   sodium chloride   Intravenous Once   allopurinol  300 mg Oral Daily   Chlorhexidine Gluconate Cloth  6 each Topical Daily   feeding supplement (ENSURE ENLIVE)  237 mL Oral BID BM   feeding supplement (PRO-STAT SUGAR FREE 64)  30 mL Oral BID   folic acid  1 mg Oral Daily   furosemide  40 mg Intravenous Daily   hydrocortisone   Rectal TID   ipratropium  0.5 mg Nebulization BID   levalbuterol  0.63 mg Nebulization BID   mouth rinse  15 mL Mouth Rinse BID   multivitamin with minerals  1  tablet Oral Daily   nicotine  7 mg Transdermal Daily   nystatin  5 mL Mouth/Throat QID   pantoprazole  20 mg Oral Daily   Continuous Infusions:  sodium chloride     sodium chloride 20 mL/hr at 08/25/19 1336   sodium chloride       LOS: 11 days    Time spent: 36 min  Pearlean Brownie, MD [facility] Pager [phone]  If 7PM-7AM, please contact night-coverage [email or web address] Password [email or web address] 08/28/2019, 10:50 AM

## 2019-08-28 NOTE — Progress Notes (Signed)
Pt HR sustaining 130s. BP 106/49. Asymptomatic. EKG performed. MD notified. Will continue to monitor.

## 2019-08-28 NOTE — Progress Notes (Signed)
Physical Therapy Treatment Patient Details Name: Isaac Pierce MRN: [id number] DOB: 1961/04/11 Today's Date: 08/28/2019    History of Present Illness Pt admitted with severe sepsis, hypnatremia, pleural effusion and non-hodgkin Lymphoma    PT Comments    Assisted OOB to amb.  General bed mobility comments: no physical assist, only required increased time.  General transfer comment: assist for mild unsteady balance, impulsive.  required 50% VC's on safety with turns.  General Gait Details: amb with and without a walker for trial.  Tolerated an increased distance however HR increased to 150.   Positioned in recliner as lunch tray arrived.    Follow Up Recommendations  Home health PT;No PT follow up(pending medical)     Equipment Recommendations       Recommendations for Other Services       Precautions / Restrictions Precautions Precautions: Fall Restrictions Weight Bearing Restrictions: No    Mobility  Bed Mobility Overal bed mobility: Needs Assistance Bed Mobility: Supine to Sit     Supine to sit: Supervision     General bed mobility comments: no physical assist, only required increased time  Transfers Overall transfer level: Needs assistance Equipment used: None;Rolling walker (2 wheeled) Transfers: Sit to/from Stand Sit to Stand: Min guard;Min assist         General transfer comment: assist for mild unsteady balance, impulsive.  required 50% VC's on safety with turns  Ambulation/Gait Ambulation/Gait assistance: Min guard;Min assist Gait Distance (Feet): 225 Feet Assistive device: Rolling walker (2 wheeled);None Gait Pattern/deviations: Step-through pattern;Decreased step length - right;Decreased step length - left;Shuffle;Staggering left;Staggering right;Narrow base of support Gait velocity: decr   General Gait Details: amb with and without a walker for trial.  Tolerated an increased distance however HR increased to 150   Stairs              Wheelchair Mobility    Modified Rankin (Stroke Patients Only)       Balance                                            Cognition Arousal/Alertness: Awake/alert Behavior During Therapy: Impulsive Overall Cognitive Status: Impaired/Different from baseline                                 General Comments: following all commands but at times spoke of events that was not coherent.  Something about a room they took him to to die?      Exercises      General Comments        Pertinent Vitals/Pain Pain Assessment: No/denies pain    Home Living                      Prior Function            PT Goals (current goals can now be found in the care plan section) Progress towards PT goals: Progressing toward goals    Frequency    Min 3X/week      PT Plan Current plan remains appropriate    Co-evaluation              AM-PAC PT "6 Clicks" Mobility   Outcome Measure  Help needed turning from your back to your side while in a flat bed without using bedrails?: None Help  needed moving from lying on your back to sitting on the side of a flat bed without using bedrails?: A Little Help needed moving to and from a bed to a chair (including a wheelchair)?: A Little Help needed standing up from a chair using your arms (e.g., wheelchair or bedside chair)?: A Little Help needed to walk in hospital room?: A Little Help needed climbing 3-5 steps with a railing? : A Lot 6 Click Score: 18    End of Session Equipment Utilized During Treatment: Gait belt Activity Tolerance: Patient tolerated treatment well Patient left: in chair;with call bell/phone within reach;with chair alarm set Nurse Communication: Mobility status;Other (comment) PT Visit Diagnosis: Unsteadiness on feet (R26.81);Muscle weakness (generalized) (M62.81);Difficulty in walking, not elsewhere classified (R26.2)     Time: [id number] PT Time Calculation (min) (ACUTE  ONLY): 24 min  Charges:  $Gait Training: 8-22 mins $Therapeutic Activity: 8-22 mins                     Rica Koyanagi  PTA Acute  Rehabilitation Services Pager      [phone] Office      [phone]

## 2019-08-29 LAB — HEMOGLOBIN AND HEMATOCRIT, BLOOD
HCT: 25.1 % — ABNORMAL LOW (ref 39.0–52.0)
HCT: 22.6 % — ABNORMAL LOW (ref 39.0–52.0)
Hemoglobin: 8.4 g/dL — ABNORMAL LOW (ref 13.0–17.0)
Hemoglobin: 7.5 g/dL — ABNORMAL LOW (ref 13.0–17.0)

## 2019-08-29 LAB — COMPREHENSIVE METABOLIC PANEL
ALT: 41 U/L (ref 0–44)
AST: 72 U/L — ABNORMAL HIGH (ref 15–41)
Albumin: 1.8 g/dL — ABNORMAL LOW (ref 3.5–5.0)
Alkaline Phosphatase: 259 U/L — ABNORMAL HIGH (ref 38–126)
Anion gap: 9 (ref 5–15)
BUN: 21 mg/dL — ABNORMAL HIGH (ref 6–20)
CO2: 26 mmol/L (ref 22–32)
Calcium: 9.9 mg/dL (ref 8.9–10.3)
Chloride: 95 mmol/L — ABNORMAL LOW (ref 98–111)
Creatinine, Ser: 0.66 mg/dL (ref 0.61–1.24)
GFR calc Af Amer: >60 mL/min (ref >60)
GFR calc non Af Amer: >60 mL/min (ref >60)
Glucose, Bld: 85 mg/dL (ref 70–99)
Potassium: 3.8 mmol/L (ref 3.5–5.1)
Sodium: 130 mmol/L — ABNORMAL LOW (ref 135–145)
Total Bilirubin: 1 mg/dL (ref 0.3–1.2)
Total Protein: 4.2 g/dL — ABNORMAL LOW (ref 6.5–8.1)

## 2019-08-29 LAB — CBC
HCT: 22.3 % — ABNORMAL LOW (ref 39.0–52.0)
Hemoglobin: 7.4 g/dL — ABNORMAL LOW (ref 13.0–17.0)
MCH: 29.2 pg (ref 26.0–34.0)
MCHC: 33.2 g/dL (ref 30.0–36.0)
MCV: 88.1 fL (ref 80.0–100.0)
Platelets: 45 10*3/uL — ABNORMAL LOW (ref 150–400)
RBC: 2.53 MIL/uL — ABNORMAL LOW (ref 4.22–5.81)
RDW: 19.8 % — ABNORMAL HIGH (ref 11.5–15.5)
WBC: 4.4 10*3/uL (ref 4.0–10.5)
nRBC: 0 % (ref 0.0–0.2)

## 2019-08-29 MED ORDER — METOPROLOL TARTRATE 5 MG/5ML IV SOLN
2.5000 mg | Freq: Once | INTRAVENOUS | Status: AC
  Administered 2019-08-29: 2.5 mg via INTRAVENOUS
  Filled 2019-08-29: qty 5

## 2019-08-29 NOTE — Progress Notes (Signed)
Pt wants advance directive completed. Since it is not urgent and AC is not a notary, I explained to pt and his sister that would will need to be taken care of tomorrow (Monday). I will refer pt to chaplain. Please page if additional assistance is needed prior to that time. [place], MDiv   08/29/19 1500  Clinical Encounter Type  Visited With Patient and family together

## 2019-08-29 NOTE — Progress Notes (Signed)
Physical Therapy Treatment Patient Details Name: Isaac Pierce MRN: [id number] DOB: 08/31/1961 Today's Date: 08/29/2019    History of Present Illness Pt admitted with severe sepsis, hypnatremia, pleural effusion and non-hodgkin Lymphoma    PT Comments    Pt cooperative but continues with tangential thinking and requiring cues to return to task.  Pt with slow improvement in activity tolerance and ambulatory stability.   Follow Up Recommendations  Home health PT;No PT follow up     Equipment Recommendations  None recommended by PT    Recommendations for Other Services       Precautions / Restrictions Precautions Precautions: Fall Restrictions Weight Bearing Restrictions: No    Mobility  Bed Mobility Overal bed mobility: Needs Assistance Bed Mobility: Supine to Sit;Sit to Supine     Supine to sit: Supervision Sit to supine: Modified independent (Device/Increase time)   General bed mobility comments: no physical assist, only required increased time  Transfers Overall transfer level: Needs assistance Equipment used: None;Rolling walker (2 wheeled) Transfers: Sit to/from Stand Sit to Stand: Min guard;Min assist         General transfer comment: assist for mild unsteady balance, impulsive.  required 50% VC's on safety with turns  Ambulation/Gait Ambulation/Gait assistance: Min guard;Min assist Gait Distance (Feet): 225 Feet Assistive device: Rolling walker (2 wheeled);1 person hand held assist Gait Pattern/deviations: Step-through pattern;Decreased step length - right;Decreased step length - left;Shuffle;Staggering left;Staggering right;Narrow base of support Gait velocity: decr   General Gait Details: amb with and without a walker for trial.  Tolerated an increased distance however HR increased to 144   Stairs             Wheelchair Mobility    Modified Rankin (Stroke Patients Only)       Balance Overall balance assessment: Needs  assistance Sitting-balance support: No upper extremity supported;Feet supported Sitting balance-Leahy Scale: Good     Standing balance support: No upper extremity supported Standing balance-Leahy Scale: Fair                              Cognition Arousal/Alertness: Awake/alert Behavior During Therapy: Impulsive Overall Cognitive Status: Impaired/Different from baseline                                 General Comments: following all commands but at times spoke of events that was not coherent.  Something about a room they took him to to die?      Exercises      General Comments        Pertinent Vitals/Pain Pain Assessment: No/denies pain    Home Living                      Prior Function            PT Goals (current goals can now be found in the care plan section) Acute Rehab PT Goals Patient Stated Goal: Regain IND PT Goal Formulation: With patient Time For Goal Achievement: 09/10/19 Potential to Achieve Goals: Good Progress towards PT goals: Progressing toward goals    Frequency    Min 3X/week      PT Plan Current plan remains appropriate    Co-evaluation              AM-PAC PT "6 Clicks" Mobility   Outcome Measure  Help needed turning from your back to  your side while in a flat bed without using bedrails?: None Help needed moving from lying on your back to sitting on the side of a flat bed without using bedrails?: A Little Help needed moving to and from a bed to a chair (including a wheelchair)?: A Little Help needed standing up from a chair using your arms (e.g., wheelchair or bedside chair)?: A Little Help needed to walk in hospital room?: A Little Help needed climbing 3-5 steps with a railing? : A Lot 6 Click Score: 18    End of Session Equipment Utilized During Treatment: Gait belt Activity Tolerance: Patient tolerated treatment well Patient left: with call bell/phone within reach;in bed;with bed alarm  set Nurse Communication: Mobility status PT Visit Diagnosis: Unsteadiness on feet (R26.81);Muscle weakness (generalized) (M62.81);Difficulty in walking, not elsewhere classified (R26.2)     Time: [id number] PT Time Calculation (min) (ACUTE ONLY): 25 min  Charges:  $Gait Training: 8-22 mins $Therapeutic Activity: 8-22 mins                     [place] Pager [phone] Office [phone]    Chanteria Haggard 08/29/2019, 1:47 PM

## 2019-08-29 NOTE — Progress Notes (Signed)
PROGRESS NOTE    Isaac Pierce  [id number] DOB: 05-25-1961 DOA: 08/17/2019 PCP: Patient, No Pcp Per   Brief Narrative: Patient is very comfortable does not have any significant pain discomfort and dizziness.  He has been able to eat fairly well.  He is getting up and moves around in the room without any significant dizziness.   Assessment & Plan:   Principal Problem:   Severe sepsis ([place]) Active Problems:   Malignancy (HCC)   Pancytopenia (HCC)   Dyspnea   Pleural effusion   Hyponatremia   Protein-calorie malnutrition, severe   Non-Hodgkin lymphoma (HCC)   Pressure injury of skin   Sinus tachycardia Referred to telemetry floor.  Encourage activity.  Monitor H&H and transfuse if hemoglobin drops below 7.  DVT prophylaxis: Contraindicated Code Status: Code  Family Communication:  Disposition Plan: Tachycardia still a concern but he does not have heart failure on the last echo.  The last ejection fraction was 60 to 65%. I feel that the combination of anemia and conditioning is the cause.  However he needs to be monitored on telemetry.  We will transfer him to [facility] with telemetry and follow his progress hopefully can get him ready for discharge in the next day or two.  Globin is 7.4 and we will keep monitoring without further transfusion   Consultants: Oncology.  Port-A-Cath has been requested Procedures: Noted Antimicrobials:   Subjective: During the opening    Objective: Vitals:   08/29/19 0500 08/29/19 0600 08/29/19 0756 08/29/19 0800  BP:      Pulse: (!) 128 (!) 120  (!) 109  Resp: (!) 30 (!) 22  (!) 22  Temp:   98.5 F (36.9 C)   TempSrc:   Oral   SpO2: 93% 94%  95%  Weight:      Height:      Heart rate is now starting to come down tachypnea is also improved.  Intake/Output Summary (Last 24 hours) at 08/29/2019 1031 Last data filed at 08/29/2019 0700 Gross per 24 hour  Intake 2203.43 ml  Output 1950 ml  Net 253.43 ml   Filed Weights   08/27/19 0500  08/28/19 0411 08/29/19 0435  Weight: 65 kg 67.6 kg 69.1 kg    Examination:  General exam: Appears calm and comfortable  Respiratory system: Clear to auscultation. Respiratory effort normal. Cardiovascular system: S1 & S2 heard, RRR. No JVD, murmurs, rubs, gallops or clicks. No pedal edema. Gastrointestinal system: Abdomen is nondistended, soft and nontender. No organomegaly or masses felt. Normal bowel sounds heard. Central nervous system: Alert and oriented. No focal neurological deficits. Extremities: Symmetric 5 x 5 power. Skin: No rashes, lesions or ulcers Psychiatry: Judgement and insight appear normal. Mood & affect appropriate.     Data Reviewed: I have personally reviewed following labs and imaging studies HB is 7.2 .CBC: Sodium is 130 blood sugar is 85 creatinine is 0.666 and GFR is adequate Recent Labs  Lab 08/23/19 0502 08/24/19 0545 08/25/19 0756 08/26/19 0223 08/26/19 1123 08/28/19 0601 08/28/19 1904 08/29/19 0632  WBC 4.9 5.2 5.0 4.7 4.9 4.6 5.4 4.4  NEUTROABS 1.2* 1.4* 1.7 2.3  --   --   --   --   HGB 7.3* 7.9* 7.5* 7.2* 7.8* 7.3* 8.2* 7.4*  HCT 22.8* 23.9* 23.6* 23.2* 23.9* 22.1* 24.6* 22.3*  MCV 86.4 86.6 88.1 89.6 87.9 87.7 88.2 88.1  PLT 46* 46* 53* 43* 52* 50* 54* 45*   Basic Metabolic Panel: Recent Labs  Lab 08/23/19 0502 08/24/19  [id number] 08/25/19 0530 08/26/19 0223 08/26/19 1123 08/28/19 0601 08/29/19 0632  NA 133* 134* 134* 133* 134* 131* 130*  K 3.1* 3.7 4.4 4.2 4.5 4.4 3.8  CL 95* 97* 95* 94* 95* 93* 95*  CO2 25 26 26 25 22 25 26   GLUCOSE 127* 90 88 144* 257* 74 85  BUN 25* 27* 26* 31* 33* 23* 21*  CREATININE 0.72 0.74 0.72 0.76 1.01 0.73 0.66  CALCIUM 8.3* 8.5* 9.5 9.4 9.9 10.1 9.9  MG 1.7 1.9  --   --   --   --   --   PHOS 3.0 2.9  --   --   --   --   --    GFR: Estimated Creatinine Clearance: 100.8 mL/min (by C-G formula based on SCr of 0.66 mg/dL). Liver Function Tests: Recent Labs  Lab 08/25/19 0530 08/26/19 0223 08/26/19 1123  08/28/19 0601 08/29/19 0632  AST 61* 64* 67* 82* 72*  ALT 26 28 32 44 41  ALKPHOS 259* 229* 230* 249* 259*  BILITOT 0.8 0.9 1.0 0.8 1.0  PROT 4.6* 4.6* 4.9* 4.4* 4.2*  ALBUMIN 1.8* 1.9* 2.0* 1.7* 1.8*   No results for input(s): LIPASE, AMYLASE in the last 168 hours. No results for input(s): AMMONIA in the last 168 hours. Coagulation Profile: No results for input(s): INR, PROTIME in the last 168 hours. Cardiac Enzymes: No results for input(s): CKTOTAL, CKMB, CKMBINDEX, TROPONINI in the last 168 hours. BNP (last 3 results) No results for input(s): PROBNP in the last 8760 hours. HbA1C: No results for input(s): HGBA1C in the last 72 hours. CBG: No results for input(s): GLUCAP in the last 168 hours. Lipid Profile: No results for input(s): CHOL, HDL, LDLCALC, TRIG, CHOLHDL, LDLDIRECT in the last 72 hours. Thyroid Function Tests: No results for input(s): TSH, T4TOTAL, FREET4, T3FREE, THYROIDAB in the last 72 hours. Anemia Panel: No results for input(s): VITAMINB12, FOLATE, FERRITIN, TIBC, IRON, RETICCTPCT in the last 72 hours. Sepsis Labs: No results for input(s): PROCALCITON, LATICACIDVEN in the last 168 hours.  Recent Results (from the past 240 hour(s))  Culture, blood (routine x 2)     Status: None   Collection Time: 08/19/19  5:48 PM   Specimen: BLOOD RIGHT HAND  Result Value Ref Range Status   Specimen Description   Final    BLOOD RIGHT HAND Performed at [facility], [address], [place], [place] [address]    Special Requests   Final    BOTTLES DRAWN AEROBIC ONLY Blood Culture adequate volume Performed at [facility] [address], [place], [place] [address]    Culture   Final    NO GROWTH 5 DAYS Performed at [facility], [place] [address] [place], [place] [address]    Report Status 08/24/2019 FINAL  Final  Culture, blood (routine x 2)     Status: None   Collection Time: 08/19/19  5:48 PM   Specimen: BLOOD LEFT HAND  Result Value  Ref Range Status   Specimen Description   Final    BLOOD LEFT HAND Performed at [facility], [place] [address] [place], [place] [address]    Special Requests   Final    BOTTLES DRAWN AEROBIC ONLY Blood Culture adequate volume Performed at [facility] [address] [place], [place] [address]    Culture   Final    NO GROWTH 5 DAYS Performed at [facility], [place] [address], [place], [place]  [id number]    Report Status 08/24/2019 FINAL  Final  Body fluid culture     Status: None   Collection Time: 08/22/19  4:00 PM   Specimen: PATH Cytology Pleural fluid  Result Value Ref Range Status   Specimen Description   Final    PLEURAL Performed at [facility] [address], [place], [place] [address]    Special Requests   Final    NONE Performed at [facility], [place] [address], [place], [place] [address]    Gram Stain   Final    ABUNDANT WBC PRESENT, PREDOMINANTLY MONONUCLEAR NO ORGANISMS SEEN    Culture   Final    NO GROWTH 3 DAYS Performed at [place] [address] [place], [place] [address]    Report Status 08/26/2019 FINAL  Final  Acid Fast Smear (AFB)     Status: None   Collection Time: 08/22/19  4:00 PM   Specimen: PATH Cytology Pleural fluid  Result Value Ref Range Status   AFB Specimen Processing Concentration  Final   Acid Fast Smear Negative  Final    Comment: (NOTE) Performed At: [facility] [place], [place] [id number] Rush Farmer MD [id number]    Source (AFB) PLEURAL  Final    Comment: Performed at [facility], [place] [address] [place], [place] [address]  MRSA PCR Screening     Status: None   Collection Time: 08/25/19 10:03 PM   Specimen: Nasal Mucosa; Nasopharyngeal  Result Value Ref Range Status   MRSA by PCR NEGATIVE NEGATIVE Final    Comment:        The GeneXpert MRSA Assay (FDA approved for NASAL specimens only), is one  component of a comprehensive MRSA colonization surveillance program. It is not intended to diagnose MRSA infection nor to guide or monitor treatment for MRSA infections. Performed at [facility], [place] [address] [place], [place] [address]   Culture, blood (routine x 2)     Status: None (Preliminary result)   Collection Time: 08/26/19  7:49 AM   Specimen: BLOOD RIGHT ARM  Result Value Ref Range Status   Specimen Description   Final    BLOOD RIGHT ARM Performed at [place] [address], [place], [place] [address]    Special Requests   Final    BOTTLES DRAWN AEROBIC AND ANAEROBIC Blood Culture adequate volume Performed at [place] [address], [place], [place] [address]    Culture   Final    NO GROWTH 3 DAYS Performed at [facility], [place] [address] [place], [place] [address]    Report Status PENDING  Incomplete  Culture, blood (routine x 2)     Status: None (Preliminary result)   Collection Time: 08/26/19  7:49 AM   Specimen: BLOOD RIGHT ARM  Result Value Ref Range Status   Specimen Description   Final    BLOOD RIGHT ARM Performed at [facility] [address], [place], [place] [address]    Special Requests   Final    BOTTLES DRAWN AEROBIC ONLY Blood Culture adequate volume Performed at [place] [address] [place], [place] [address]    Culture   Final    NO GROWTH 3 DAYS Performed at [facility], [place] [address] [place],  [address]    Report Status PENDING  Incomplete  Radiology Studies: No results found.      Scheduled Meds: . sodium chloride   Intravenous Once  . sodium chloride   Intravenous Once  . allopurinol  300 mg Oral Daily  . Chlorhexidine Gluconate Cloth  6 each Topical Daily  . feeding supplement (ENSURE ENLIVE)  237 mL Oral BID BM  . feeding supplement (PRO-STAT SUGAR FREE 64)  30 mL Oral BID  .  folic acid  1 mg Oral Daily  . furosemide  40 mg Intravenous Daily  . hydrocortisone   Rectal TID  . mouth rinse  15 mL Mouth Rinse BID  . multivitamin with minerals  1 tablet Oral Daily  . nicotine  7 mg Transdermal Daily  . nystatin  5 mL Mouth/Throat QID  . pantoprazole  20 mg Oral Daily   Continuous Infusions: . sodium chloride    . 0.9 % NaCl with KCl 20 mEq / L 125 mL/hr at 08/29/19 0700  . sodium chloride       LOS: 12 days    Time spent: [place], MD [facility] Pager [phone]  If 7PM-7AM, please contact night-coverage [email or web address] Password [facility] 08/29/2019, 10:31 AM

## 2019-08-29 NOTE — Progress Notes (Signed)
Upon assessment of patient, pt requested sacral foam be removed because it "feels like you're ripping my skin off!" Patient does not want another foam placed. Skin blanchable and intact under dressing.

## 2019-08-30 ENCOUNTER — Inpatient Hospital Stay (HOSPITAL_COMMUNITY): Payer: Medicaid Other

## 2019-08-30 ENCOUNTER — Encounter (HOSPITAL_COMMUNITY): Payer: Self-pay | Admitting: Interventional Radiology

## 2019-08-30 ENCOUNTER — Encounter (HOSPITAL_COMMUNITY): Payer: Self-pay | Admitting: Internal Medicine

## 2019-08-30 HISTORY — PX: IR IMAGING GUIDED PORT INSERTION: IMG5740

## 2019-08-30 LAB — CBC
HCT: 22.5 % — ABNORMAL LOW (ref 39.0–52.0)
Hemoglobin: 7.3 g/dL — ABNORMAL LOW (ref 13.0–17.0)
MCH: 29 pg (ref 26.0–34.0)
MCHC: 32.4 g/dL (ref 30.0–36.0)
MCV: 89.3 fL (ref 80.0–100.0)
Platelets: 53 10*3/uL — ABNORMAL LOW (ref 150–400)
RBC: 2.52 MIL/uL — ABNORMAL LOW (ref 4.22–5.81)
RDW: 20.3 % — ABNORMAL HIGH (ref 11.5–15.5)
WBC: 4.5 10*3/uL (ref 4.0–10.5)
nRBC: 0 % (ref 0.0–0.2)

## 2019-08-30 LAB — COMPREHENSIVE METABOLIC PANEL
ALT: 37 U/L (ref 0–44)
AST: 63 U/L — ABNORMAL HIGH (ref 15–41)
Albumin: 1.7 g/dL — ABNORMAL LOW (ref 3.5–5.0)
Alkaline Phosphatase: 282 U/L — ABNORMAL HIGH (ref 38–126)
Anion gap: 11 (ref 5–15)
BUN: 23 mg/dL — ABNORMAL HIGH (ref 6–20)
CO2: 23 mmol/L (ref 22–32)
Calcium: 10.4 mg/dL — ABNORMAL HIGH (ref 8.9–10.3)
Chloride: 99 mmol/L (ref 98–111)
Creatinine, Ser: 0.69 mg/dL (ref 0.61–1.24)
GFR calc Af Amer: >60 mL/min (ref >60)
GFR calc non Af Amer: >60 mL/min (ref >60)
Glucose, Bld: 113 mg/dL — ABNORMAL HIGH (ref 70–99)
Potassium: 4.1 mmol/L (ref 3.5–5.1)
Sodium: 133 mmol/L — ABNORMAL LOW (ref 135–145)
Total Bilirubin: 0.9 mg/dL (ref 0.3–1.2)
Total Protein: 4.2 g/dL — ABNORMAL LOW (ref 6.5–8.1)

## 2019-08-30 LAB — HEMOGLOBIN AND HEMATOCRIT, BLOOD
HCT: 22.7 % — ABNORMAL LOW (ref 39.0–52.0)
Hemoglobin: 7.5 g/dL — ABNORMAL LOW (ref 13.0–17.0)

## 2019-08-30 LAB — PROTIME-INR
INR: 1.2 (ref 0.8–1.2)
Prothrombin Time: 15.5 seconds — ABNORMAL HIGH (ref 11.4–15.2)

## 2019-08-30 LAB — SURGICAL PATHOLOGY

## 2019-08-30 LAB — TSH: TSH: 9.169 u[IU]/mL — ABNORMAL HIGH (ref 0.350–4.500)

## 2019-08-30 LAB — PREPARE RBC (CROSSMATCH)

## 2019-08-30 MED ORDER — LIDOCAINE-EPINEPHRINE (PF) 2 %-1:200000 IJ SOLN
INTRAMUSCULAR | Status: AC
  Filled 2019-08-30: qty 20

## 2019-08-30 MED ORDER — SODIUM CHLORIDE 0.9% IV SOLUTION: Freq: Once | INTRAVENOUS | Status: DC

## 2019-08-30 MED ORDER — CEFAZOLIN SODIUM-DEXTROSE 2-4 GM/100ML-% IV SOLN
2.0000 g | INTRAVENOUS | Status: AC
  Administered 2019-08-30: 16:00:00 2 g via INTRAVENOUS
  Filled 2019-08-30: qty 100

## 2019-08-30 MED ORDER — MIDAZOLAM HCL 2 MG/2ML IJ SOLN
INTRAMUSCULAR | Status: AC | PRN
  Administered 2019-08-30 (×2): 1 mg via INTRAVENOUS

## 2019-08-30 MED ORDER — FENTANYL CITRATE (PF) 100 MCG/2ML IJ SOLN
INTRAMUSCULAR | Status: AC
  Filled 2019-08-30: qty 2

## 2019-08-30 MED ORDER — LORAZEPAM 0.5 MG PO TABS
0.5000 mg | ORAL_TABLET | Freq: Four times a day (QID) | ORAL | Status: DC | PRN
  Administered 2019-08-30 – 2019-09-06 (×13): 0.5 mg via ORAL
  Filled 2019-08-30 (×13): qty 1

## 2019-08-30 MED ORDER — METOPROLOL TARTRATE 25 MG PO TABS
12.5000 mg | ORAL_TABLET | Freq: Two times a day (BID) | ORAL | Status: DC
  Administered 2019-08-30 – 2019-09-01 (×3): 12.5 mg via ORAL
  Filled 2019-08-30 (×6): qty 1

## 2019-08-30 MED ORDER — FENTANYL CITRATE (PF) 100 MCG/2ML IJ SOLN
INTRAMUSCULAR | Status: AC | PRN
  Administered 2019-08-30 (×2): 50 ug via INTRAVENOUS

## 2019-08-30 MED ORDER — MIDAZOLAM HCL 2 MG/2ML IJ SOLN
INTRAMUSCULAR | Status: AC
  Filled 2019-08-30: qty 2

## 2019-08-30 MED ORDER — FUROSEMIDE 20 MG PO TABS
20.0000 mg | ORAL_TABLET | Freq: Every day | ORAL | Status: DC
  Administered 2019-08-31 – 2019-09-01 (×2): 20 mg via ORAL
  Filled 2019-08-30 (×2): qty 1

## 2019-08-30 MED ORDER — LIDOCAINE-EPINEPHRINE (PF) 2 %-1:200000 IJ SOLN
INTRAMUSCULAR | Status: AC | PRN
  Administered 2019-08-30 (×2): 10 mL

## 2019-08-30 MED ORDER — CEFAZOLIN SODIUM-DEXTROSE 2-4 GM/100ML-% IV SOLN
INTRAVENOUS | Status: AC
  Administered 2019-08-30: 16:00:00 2 g via INTRAVENOUS
  Filled 2019-08-30: qty 100

## 2019-08-30 NOTE — Procedures (Signed)
Pre Procedure Dx: Poor venous access Post Procedural Dx: Same  Successful placement of right IJ approach port-a-cath with tip at the superior caval atrial junction. The catheter is ready for immediate use.  Estimated Blood Loss: Minimal  Complications: None immediate.  Jay Cyrus Ramsburg, MD Pager #: [phone]   

## 2019-08-30 NOTE — Progress Notes (Signed)
Chaplain paged by RN to provide support around Advance Directive.    Mr Lynds demonstrated to be of sound mind with two witnesses.  Document notarized.  Pt with original and copies.  Copy placed in pt chart.

## 2019-08-30 NOTE — Progress Notes (Signed)
Referring Physician(s): [name],M  Supervising Physician: Sandi Mariscal  Patient Status:  WL IP  Chief Complaint: lymphoma   Subjective: Patient familiar to IR service from right inguinal lymph node biopsy on 08/18/2019, bone marrow biopsy on 08/19/2019 and left thoracentesis on 08/24/2019.  He has a history of recently diagnosed non-Hodgkin's B-cell lymphoma; in addition he has pancytopenia and chronic back pain. Hgb today 7.3, plts 53 k. He remains tachycardic; WBC nl; latest blood cx neg to date; request received from oncology for port a cath placement. He denies fever,HA,CP,N/V or bleeding. He does have dyspnea, occ cough, some abd fullness. Past Medical History:  Diagnosis Date  . Anxiety   . Back pain   . GERD (gastroesophageal reflux disease)   History reviewed. No pertinent surgical history.   Allergies: Rituximab  Medications: Prior to Admission medications   Medication Sig Start Date End Date Taking? Authorizing Provider  hydrOXYzine (ATARAX/VISTARIL) 10 MG tablet Take 10 mg by mouth 3 (three) times daily as needed. 05/31/19  Yes [provider]  ibuprofen (ADVIL) 200 MG tablet Take 200 mg by mouth every 6 (six) hours as needed for moderate pain.   Yes [provider]  omeprazole (PRILOSEC) 20 MG capsule Take 20 mg by mouth daily. 07/23/19  Yes [provider]     Vital Signs: BP (!) 105/56 (BP Location: Right Arm)   Pulse (!) 126   Temp 98.6 F (37 C) (Oral)   Resp 18   Ht '5\' 11"'  (1.803 m)   Wt 155 lb 8 oz (70.5 kg)   SpO2 96%   BMI 21.69 kg/m   Physical Exam awake/alert; chest- dim BS bases, L>R; heart- tachy but regular rhythm; abd- sl dist, soft,+BS , NT; no LE edema  Imaging: No results found.  Labs:  CBC: Recent Labs    08/28/19 0601 08/28/19 1904 08/29/19 0632 08/29/19 1151 08/29/19 2250 08/30/19 0424  WBC 4.6 5.4 4.4  --   --  4.5  HGB 7.3* 8.2* 7.4* 8.4* 7.5* 7.3*  HCT 22.1* 24.6* 22.3* 25.1* 22.6* 22.5*   PLT 50* 54* 45*  --   --  53*    COAGS: Recent Labs    08/18/19 1314  INR 1.3*    BMP: Recent Labs    08/26/19 1123 08/28/19 0601 08/29/19 0632 08/30/19 0424  NA 134* 131* 130* 133*  K 4.5 4.4 3.8 4.1  CL 95* 93* 95* 99  CO2 '22 25 26 23  ' GLUCOSE 257* 74 85 113*  BUN 33* 23* 21* 23*  CALCIUM 9.9 10.1 9.9 10.4*  CREATININE 1.01 0.73 0.66 0.69  GFRNONAA >60 >60 >60 >60  GFRAA >60 >60 >60 >60    LIVER FUNCTION TESTS: Recent Labs    08/26/19 1123 08/28/19 0601 08/29/19 0632 08/30/19 0424  BILITOT 1.0 0.8 1.0 0.9  AST 67* 82* 72* 63*  ALT 32 44 41 37  ALKPHOS 230* 249* 259* 282*  PROT 4.9* 4.4* 4.2* 4.2*  ALBUMIN 2.0* 1.7* 1.8* 1.7*    Assessment and Plan: Pt with history of recently diagnosed non-Hodgkin's B-cell lymphoma; in addition he has pancytopenia and chronic back pain. Hgb today 7.3, plts 53 k. He remains tachycardic; WBC nl; latest blood cx neg to date; request received from oncology for port a cath placement.Risks and benefits of image guided port-a-catheter placement was discussed with the patient including, but not limited to bleeding, infection, pneumothorax, or fibrin sheath development and need for additional procedures.  All of the patient's questions  were answered, patient is agreeable to proceed. Consent signed and in chart.  Discussed case with primary team; recommend blood transfusion prior to port placement ; will tent plan case for later today or tomorrow   Electronically Signed: D. Rowe Robert, PA-C 08/30/2019, 9:56 AM   I spent a total of 20 minutes at the the patient's bedside AND on the patient's hospital floor or unit, greater than 50% of which was counseling/coordinating care for port a cath placement    Patient ID: Isaac Pierce, male   DOB: [birth year], 58 y.o.   MRN: [id number]

## 2019-08-30 NOTE — TOC Progression Note (Signed)
Transition of Care [facility]) - Progression Note    Patient Details  Name: Isaac Pierce MRN: [id number] Date of Birth: 1961/03/21  [facility]) CM/SW Contact  Javious Hallisey, Juliann Pulse, RN Phone Number: 08/30/2019, 3:35 PM  Clinical Narrative:   [facility] agency will not be able to accept d/t no insurance-patient off floor-will inform tomorrow.pcp appt set.    Expected Discharge Plan: Home/Self Care Barriers to Discharge: Continued Medical Work up  Expected Discharge Plan and Services Expected Discharge Plan: Home/Self Care In-house Referral: Clinical Social Work     Living arrangements for the past 2 months: Mobile Home                                       Social Determinants of Health (SDOH) Interventions    Readmission Risk Interventions No flowsheet data found.

## 2019-08-30 NOTE — TOC Progression Note (Signed)
Transition of Care [facility]) - Progression Note    Patient Details  Name: Isaac Pierce MRN: [id number] Date of Birth: 12-29-1960  Transition of Care [facility]) CM/SW Contact  Jericca Russett, Juliann Pulse, RN Phone Number: 08/30/2019, 2:31 PM  Clinical Narrative: Recc HHPT-no insurance-will check w/charity Dennison.      Expected Discharge Plan: Home/Self Care Barriers to Discharge: Continued Medical Work up  Expected Discharge Plan and Services Expected Discharge Plan: Home/Self Care In-house Referral: Clinical Social Work     Living arrangements for the past 2 months: Mobile Home                                       Social Determinants of Health (SDOH) Interventions    Readmission Risk Interventions No flowsheet data found.

## 2019-08-30 NOTE — Progress Notes (Signed)
Pt refuses Mycostatin suspension as well as liquid prostat. Encouraged and educated.  Pt also refuses any cleansing to his back. Potential skin damage noted but he states he has had it for quite some time and would like to handle his own care for as long as possible. Educated yet he has still declined.

## 2019-08-30 NOTE — Progress Notes (Addendum)
Physical Therapy Treatment Patient Details Name: Isaac Pierce MRN: [id number] DOB: 1961/08/01 Today's Date: 08/30/2019    History of Present Illness Pt admitted with severe sepsis, hypnatremia, pleural effusion and non-hodgkin Lymphoma    PT Comments    Pt was willing to walk with PT today. Pt was frustrated about not seeing the doctor today.  General bed mobility comments: Pt required HHA to sit up on the EOB. General transfer comment: Pt required CGA for safety when standing. Pt required VC's to push up from the bed and not to pull on the walker General Gait Details: Pt was able to ambulate 200 ft with light assistance from the walker. Pt was able to tolerate distance, HR increased to 135. Pt was left on EOB with notary present.    Follow Up Recommendations  Home health PT     Equipment Recommendations  None recommended by PT    Recommendations for Other Services       Precautions / Restrictions Precautions Precautions: Fall Restrictions Weight Bearing Restrictions: No    Mobility  Bed Mobility Overal bed mobility: Needs Assistance Bed Mobility: Supine to Sit     Supine to sit: Min assist     General bed mobility comments: Pt required HHA to sit up on the EOB.  Transfers Overall transfer level: Needs assistance Equipment used: None;Rolling walker (2 wheeled) Transfers: Sit to/from Stand Sit to Stand: Min guard         General transfer comment: Pt required CGA for safety when standing. Pt required VC's to push up from the bed and not to pull on the walker  Ambulation/Gait Ambulation/Gait assistance: Min guard Gait Distance (Feet): 200 Feet Assistive device: Rolling walker (2 wheeled) Gait Pattern/deviations: Step-through pattern;Decreased stride length     General Gait Details: Pt was able to ambulate 200 ft with light assistance from the walker. Pt was able to tolerate distance, HR increased to 135   Stairs             Wheelchair Mobility     Modified Rankin (Stroke Patients Only)       Balance                                            Cognition Arousal/Alertness: Awake/alert Behavior During Therapy: Agitated                                          Exercises      General Comments        Pertinent Vitals/Pain Pain Assessment: 0-10 Pain Score: 5  Pain Location: headache Pain Descriptors / Indicators: Discomfort;Dull Pain Intervention(s): Monitored during session;Repositioned    Home Living                      Prior Function            PT Goals (current goals can now be found in the care plan section) Progress towards PT goals: Progressing toward goals    Frequency    Min 3X/week      PT Plan Current plan remains appropriate    Co-evaluation              AM-PAC PT "6 Clicks" Mobility   Outcome Measure  Help needed turning from  your back to your side while in a flat bed without using bedrails?: None Help needed moving from lying on your back to sitting on the side of a flat bed without using bedrails?: A Little Help needed moving to and from a bed to a chair (including a wheelchair)?: A Little Help needed standing up from a chair using your arms (e.g., wheelchair or bedside chair)?: A Little Help needed to walk in hospital room?: A Little Help needed climbing 3-5 steps with a railing? : A Lot 6 Click Score: 18    End of Session Equipment Utilized During Treatment: Gait belt Activity Tolerance: Patient tolerated treatment well Patient left: in bed;with nursing/sitter in room;with call bell/phone within reach;Other (comment)(Notary left in room with two witnesses) Nurse Communication: Mobility status PT Visit Diagnosis: Unsteadiness on feet (R26.81);Muscle weakness (generalized) (M62.81);Difficulty in walking, not elsewhere classified (R26.2)     Time: 1350-1406 PT Time Calculation (min) (ACUTE ONLY): 16 min  Charges:  $Gait  Training: 8-22 mins                     Excell Seltzer, [facility]

## 2019-08-30 NOTE — Progress Notes (Addendum)
PROGRESS NOTE    Isaac Pierce  [id number] DOB: 07/11/1961 DOA: 08/17/2019 PCP: Patient, No Pcp Per   Brief Narrative:  Patient is a 58 year old male with history of chronic back pain, GERD, anxiety, homelessness who initially presented with shortness of breath.  He complained of generalized weakness, loss of appetite, shortness of breath, productive cough, recent weight loss, leg swelling and distended abdomen when he presented.  He also noticed lumps underneath his axilla and neck.  He has family history of lymphoma.  He was smoking 1 to 2 packs of cigarettes a day.  When he presented he was tachycardic.  Chest x-ray showed mild vascular congestion, small bilateral effusion.  CT angiogram was negative for PE but showed bulky mediastinal, bilateral hilar, bilateral axillary lymphadenopathy.  Findings are concerning for primary lymphoproliferative process such as lymphoma.  Also found to have noncalcified pulmonary nodules measuring up to 10 mm and also hepatosplenomegaly.  He was also anemic on presentation so he was transfused with PRBCs. Total of 5 units of PRBCs during this hospitalization.    Oncology consulted.  Hospital course remarkable for respiratory distress which improved with Lasix.  He underwent biopsy of the right inguinal lymph node and bone marrow biopsy. He is persistently febrile due to lymphoma.  Also underwent thoracentesis for left-sided pleural effusion.  Bone marrow biopsy report showed atypical lymphocytes suspicious for lymphoproliferative disease.   Oncology planning  to start on Rituxan  for lymphoma as an outpatient and failed to start here as inpatient after attempting twice due to extreme anxiety.  Plan is to place a Chemo-Port.  He is being transfused  with a unit of  PRBC today.  Assessment & Plan:   Principal Problem:   Severe sepsis ([place]) Active Problems:   Malignancy (HCC)   Pancytopenia (HCC)   Dyspnea   Pleural effusion   Hyponatremia   Protein-calorie  malnutrition, severe   Non-Hodgkin lymphoma (HCC)   Pressure injury of skin   Lymphoproliferative disorder/lymphoma: Presented with 5-day history of dyspnea on exertion, nausea, significant weight loss, diffuse lymphadenopathy.  CT angiogram showed bulky mediastinal, bilateral hilar, bilateral axillary lymphadenopathy.  Also found to have pulmonary nodules, hepatosplenomegaly.  Underwent CT abdomen/pelvis for further staging which showed massive splenomegaly with abdominal and inguinal lymphadenopathy.  Underwent ultrasound-guided liver biopsy by IR of right inguinal lymph node which showed monoclonal CD5 positive B-cell population.  Also underwent CT-guided aspiration and core biopsy of right iliac bone which  showed atypical lymphocytes suspicious for lymphoproliferative disease. Oncology following and planning to start on Rituxan as an outpatient.  Concern for sepsis/lactic acidosis/fever: Lactic acid was elevated on presentation.  Currently improved.  He is persistently febrile from his lymphoma.  Blood cultures have been negative so far.  Antibiotics have been discontinued.  LA improved.  One of the blood culture set showed gram-positive cocci most likely contaminant.  Chest x-ray does not show any clear pneumonia.  Repeat blood cultures are negative.  Persistent sinus tachycardia: Heart rate in the range of 110-1 20.  Echocardiogram done here shows normal ejection fraction, no significant other findings.  TSH is elevated.  Will get T3, T4.  He is in sinus tachycardia most likely secondary to combination of anxiety, fever.  Will start on low-dose metoprolol.  Pancytopenia: Has leukopenia,anemia,thrombocytopenia.  So far transfused with a total of 5 units of PRBC.  Also has thrombocytopenia.  Will transfuse with platelets if platelets drop below 20000.  Continue folic acid.  Avoid anticoagulation.  Dyspnea/left-sided pleural effusion:  Underwent thoracentesis by IR with removal of 550 mL of yellow  fluid.  Currently does not complain of any shortness of breath.  Saturating fine on room air.  Hyponatremia: Improved  Bilateral lower extremity edema/anasarca/concern for DVT: Significantly improved with Lasix.  Anasarca secondary to low albumin from  Malignancy,poor appetite.  D-dimer was elevated at 5.  CT angiogram negative for PE.  Doppler did not show any DVT.On lasix 40 mg IV Echocardiogram showed normal ejection fraction.Will change lasix to oral.  Tobacco abuse: Counseled cessation.  Continue nicotine patch.  GERD: Continue PPI  Elevated liver enzymes: Due to abdominal lymphadenopathy.  Continue to monitor.  Abdominal distention: Secondary to lymphadenopathy, hepatosplenomegaly,ascites.  Denies any abdominal pain today.  Severe protein calorie malnutrition: Dietitian following.        Nutrition Problem: Severe Malnutrition Etiology: chronic illness(concern for lymphoma)      DVT prophylaxis:SCD Code Status: Full Family Communication: Called sister on phone 08/30/19 Disposition Plan:Home after chemo port placment   Consultants: Oncology, PCCM, IR  Procedures: Lymph node biopsy, bone marrow biopsy, thoracentesis  Antimicrobials:  Anti-infectives (From admission, onward)   Start     Dose/Rate Route Frequency Ordered Stop   08/30/19 1500  ceFAZolin (ANCEF) IVPB 2g/100 mL premix     2 g 200 mL/hr over 30 Minutes Intravenous To Radiology 08/30/19 1014 08/31/19 1500   08/18/19 2200  ceFEPIme (MAXIPIME) 2 g in sodium chloride 0.9 % 100 mL IVPB     2 g 200 mL/hr over 30 Minutes Intravenous Every 8 hours 08/18/19 1534 08/23/19 2255   08/18/19 1200  vancomycin (VANCOCIN) 1,250 mg in sodium chloride 0.9 % 250 mL IVPB  Status:  Discontinued     1,250 mg 166.7 mL/hr over 90 Minutes Intravenous Every 12 hours 08/18/19 0621 08/19/19 1733   08/18/19 0600  piperacillin-tazobactam (ZOSYN) IVPB 3.375 g     3.375 g 12.5 mL/hr over 240 Minutes Intravenous Every 8 hours 08/17/19  2251 08/18/19 1800   08/17/19 2300  vancomycin (VANCOCIN) 1,500 mg in sodium chloride 0.9 % 500 mL IVPB     1,500 mg 250 mL/hr over 120 Minutes Intravenous  Once 08/17/19 2250 08/18/19 0332   08/17/19 2300  piperacillin-tazobactam (ZOSYN) IVPB 3.375 g     3.375 g 100 mL/hr over 30 Minutes Intravenous  Once 08/17/19 2250 08/18/19 0131      Subjective:  Patient seen and examined at bedside this morning.  Hemodynamically stable.  Complains of headache today.  Saturating fine on room air.  Looks very weak, debilitated.  Denies any abdomen pain, nausea or vomiting.  Denies any shortness of breath or chest pain.  Objective: Vitals:   08/30/19 0747 08/30/19 1028 08/30/19 1105 08/30/19 1120  BP: (!) 105/56 (!) 112/59 (!) 104/58 112/68  Pulse: (!) 126 (!) 128 (!) 121 (!) 115  Resp:   20 20  Temp: 98.6 F (37 C)  (!) 101.8 F (38.8 C) 100.1 F (37.8 C)  TempSrc: Oral  Oral Oral  SpO2: 96%  98% 95%  Weight:      Height:        Intake/Output Summary (Last 24 hours) at 08/30/2019 1150 Last data filed at 08/30/2019 1026 Gross per 24 hour  Intake 2257.31 ml  Output 2825 ml  Net -567.69 ml   Filed Weights   08/28/19 0411 08/29/19 0435 08/30/19 0517  Weight: 67.6 kg 69.1 kg 70.5 kg    Examination:   General exam: Deconditioned, debilitated, thin, chronically ill looking HEENT:PERRL,Oral mucosa moist,  Ear/Nose normal on gross exam Respiratory system: Mild Decreased air entry on the left side. Cardiovascular system: S1 & S2 heard, RRR. No JVD, murmurs, rubs, gallops or clicks. Gastrointestinal system: Abdomen is distended, soft and nontender. . Normal bowel sounds heard. Central nervous system: Alert and oriented. No focal neurological deficits. Extremities:No  edema, no clubbing ,no cyanosis Skin: No rashes, lesions or ulcers,no icterus ,no pallor     Data Reviewed: I have personally reviewed following labs and imaging studies  CBC: Recent Labs  Lab 08/24/19 0545 08/25/19  0756 08/26/19 0223 08/26/19 1123 08/28/19 0601 08/28/19 1904 08/29/19 0632 08/29/19 1151 08/29/19 2250 08/30/19 0424 08/30/19 1037  WBC 5.2 5.0 4.7 4.9 4.6 5.4 4.4  --   --  4.5  --   NEUTROABS 1.4* 1.7 2.3  --   --   --   --   --   --   --   --   HGB 7.9* 7.5* 7.2* 7.8* 7.3* 8.2* 7.4* 8.4* 7.5* 7.3* 7.5*  HCT 23.9* 23.6* 23.2* 23.9* 22.1* 24.6* 22.3* 25.1* 22.6* 22.5* 22.7*  MCV 86.6 88.1 89.6 87.9 87.7 88.2 88.1  --   --  89.3  --   PLT 46* 53* 43* 52* 50* 54* 45*  --   --  53*  --    Basic Metabolic Panel: Recent Labs  Lab 08/24/19 0545  08/26/19 0223 08/26/19 1123 08/28/19 0601 08/29/19 0632 08/30/19 0424  NA 134*   < > 133* 134* 131* 130* 133*  K 3.7   < > 4.2 4.5 4.4 3.8 4.1  CL 97*   < > 94* 95* 93* 95* 99  CO2 26   < > _0 GLUCOSE 90   < > 144* 257* 74 85 113*  BUN 27*   < > 31* 33* 23* 21* 23*  CREATININE 0.74   < > 0.76 1.01 0.73 0.66 0.69  CALCIUM 8.5*   < > 9.4 9.9 10.1 9.9 10.4*  MG 1.9  --   --   --   --   --   --   PHOS 2.9  --   --   --   --   --   --    < > = values in this interval not displayed.   GFR: Estimated Creatinine Clearance: 102.8 mL/min (by C-G formula based on SCr of 0.69 mg/dL). Liver Function Tests: Recent Labs  Lab 08/26/19 0223 08/26/19 1123 08/28/19 0601 08/29/19 0632 08/30/19 0424  AST 64* 67* 82* 72* 63*  ALT 28 32 44 41 37  ALKPHOS 229* 230* 249* 259* 282*  BILITOT 0.9 1.0 0.8 1.0 0.9  PROT 4.6* 4.9* 4.4* 4.2* 4.2*  ALBUMIN 1.9* 2.0* 1.7* 1.8* 1.7*   No results for input(s): LIPASE, AMYLASE in the last 168 hours. No results for input(s): AMMONIA in the last 168 hours. Coagulation Profile: Recent Labs  Lab 08/30/19 1037  INR 1.2   Cardiac Enzymes: No results for input(s): CKTOTAL, CKMB, CKMBINDEX, TROPONINI in the last 168 hours. BNP (last 3 results) No results for input(s): PROBNP in the last 8760 hours. HbA1C: No results for input(s): HGBA1C in the last 72 hours. CBG: No results for input(s):  GLUCAP in the last 168 hours. Lipid Profile: No results for input(s): CHOL, HDL, LDLCALC, TRIG, CHOLHDL, LDLDIRECT in the last 72 hours. Thyroid Function Tests: Recent Labs    08/30/19 1010  TSH 9.169*   Anemia Panel: No results for input(s): VITAMINB12, FOLATE, FERRITIN,  TIBC, IRON, RETICCTPCT in the last 72 hours. Sepsis Labs: No results for input(s): PROCALCITON, LATICACIDVEN in the last 168 hours.  Recent Results (from the past 240 hour(s))  Body fluid culture     Status: None   Collection Time: 08/22/19  4:00 PM   Specimen: PATH Cytology Pleural fluid  Result Value Ref Range Status   Specimen Description   Final    PLEURAL Performed at [facility] [address] [place], [place] [address]    Special Requests   Final    NONE Performed at [facility], [place] [address], [place], [place] [address]    Gram Stain   Final    ABUNDANT WBC PRESENT, PREDOMINANTLY MONONUCLEAR NO ORGANISMS SEEN    Culture   Final    NO GROWTH 3 DAYS Performed at [facility] [address] [place], [place] [address]    Report Status 08/26/2019 FINAL  Final  Acid Fast Smear (AFB)     Status: None   Collection Time: 08/22/19  4:00 PM   Specimen: PATH Cytology Pleural fluid  Result Value Ref Range Status   AFB Specimen Processing Concentration  Final   Acid Fast Smear Negative  Final    Comment: (NOTE) Performed At: [facility] [place], [place] [id number] Rush Farmer MD [id number]    Source (AFB) PLEURAL  Final    Comment: Performed at [facility], [place] [address] [place], [place] [address]  MRSA PCR Screening     Status: None   Collection Time: 08/25/19 10:03 PM   Specimen: Nasal Mucosa; Nasopharyngeal  Result Value Ref Range Status   MRSA by PCR NEGATIVE NEGATIVE Final    Comment:        The GeneXpert MRSA Assay (FDA approved for NASAL specimens only), is one component of a comprehensive  MRSA colonization surveillance program. It is not intended to diagnose MRSA infection nor to guide or monitor treatment for MRSA infections. Performed at [facility], [place] [address], [place], [place] [address]   Culture, blood (routine x 2)     Status: None (Preliminary result)   Collection Time: 08/26/19  7:49 AM   Specimen: BLOOD RIGHT ARM  Result Value Ref Range Status   Specimen Description   Final    BLOOD RIGHT ARM Performed at [facility] [address], [place], [place] [address]    Special Requests   Final    BOTTLES DRAWN AEROBIC AND ANAEROBIC Blood Culture adequate volume Performed at [facility] [address], [place], [place] [address]    Culture   Final    NO GROWTH 4 DAYS Performed at [facility], [place] [address], [place], [place] [address]    Report Status PENDING  Incomplete  Culture, blood (routine x 2)     Status: None (Preliminary result)   Collection Time: 08/26/19  7:49 AM   Specimen: BLOOD RIGHT ARM  Result Value Ref Range Status   Specimen Description   Final    BLOOD RIGHT ARM Performed at [facility] [address], [place], [place] [address]    Special Requests   Final    BOTTLES DRAWN AEROBIC ONLY Blood Culture adequate volume Performed at [facility] [address] [place], [place] [address]    Culture   Final    NO GROWTH 4 DAYS Performed at [facility], [place]  [address] [place], [place] [address]    Report Status PENDING  Incomplete         Radiology Studies: No results found.      Scheduled Meds: . sodium chloride   Intravenous Once  . sodium chloride   Intravenous Once  . sodium chloride   Intravenous Once  . sodium chloride   Intravenous Once  . allopurinol  300 mg Oral Daily  . Chlorhexidine Gluconate Cloth  6 each Topical Daily  . feeding supplement (ENSURE ENLIVE)  237 mL Oral BID BM  . feeding  supplement (PRO-STAT SUGAR FREE 64)  30 mL Oral BID  . folic acid  1 mg Oral Daily  . furosemide  40 mg Intravenous Daily  . hydrocortisone   Rectal TID  . mouth rinse  15 mL Mouth Rinse BID  . metoprolol tartrate  12.5 mg Oral BID  . multivitamin with minerals  1 tablet Oral Daily  . nicotine  7 mg Transdermal Daily  . nystatin  5 mL Mouth/Throat QID  . pantoprazole  20 mg Oral Daily   Continuous Infusions: . sodium chloride    . 0.9 % NaCl with KCl 20 mEq / L 125 mL/hr at 08/30/19 0545  .  ceFAZolin (ANCEF) IV    . sodium chloride       LOS: 13 days    Time spent: 35 mins.More than 50% of that time was spent in counseling and/or coordination of care.      Shelly [place], MD [facility] Pager [phone]  If 7PM-7AM, please contact night-coverage [email or web address] Password [email or web address] 08/30/2019, 11:50 AM

## 2019-08-31 LAB — CBC WITH DIFFERENTIAL/PLATELET
Abs Immature Granulocytes: 0.04 10*3/uL (ref 0.00–0.07)
Basophils Absolute: 0 10*3/uL (ref 0.0–0.1)
Basophils Relative: 0 %
Eosinophils Absolute: 0 10*3/uL (ref 0.0–0.5)
Eosinophils Relative: 0 %
HCT: 23.6 % — ABNORMAL LOW (ref 39.0–52.0)
Hemoglobin: 7.7 g/dL — ABNORMAL LOW (ref 13.0–17.0)
Immature Granulocytes: 1 %
Lymphocytes Relative: 43 %
Lymphs Abs: 1.6 10*3/uL (ref 0.7–4.0)
MCH: 28.8 pg (ref 26.0–34.0)
MCHC: 32.6 g/dL (ref 30.0–36.0)
MCV: 88.4 fL (ref 80.0–100.0)
Monocytes Absolute: 1.1 10*3/uL — ABNORMAL HIGH (ref 0.1–1.0)
Monocytes Relative: 29 %
Neutro Abs: 1 10*3/uL — ABNORMAL LOW (ref 1.7–7.7)
Neutrophils Relative %: 27 %
Platelets: 55 10*3/uL — ABNORMAL LOW (ref 150–400)
RBC: 2.67 MIL/uL — ABNORMAL LOW (ref 4.22–5.81)
RDW: 19.4 % — ABNORMAL HIGH (ref 11.5–15.5)
WBC: 3.7 10*3/uL — ABNORMAL LOW (ref 4.0–10.5)
nRBC: 0 % (ref 0.0–0.2)

## 2019-08-31 LAB — BPAM RBC
Blood Product Expiration Date: 202012092359
Blood Product Expiration Date: 202012302359
ISSUE DATE / TIME: 202011281457
ISSUE DATE / TIME: 202011301100
Unit Type and Rh: 5100
Unit Type and Rh: 5100

## 2019-08-31 LAB — TYPE AND SCREEN
ABO/RH(D): O POS
Antibody Screen: NEGATIVE
Unit division: 0
Unit division: 0

## 2019-08-31 LAB — CULTURE, BLOOD (ROUTINE X 2)
Culture: NO GROWTH
Culture: NO GROWTH
Special Requests: ADEQUATE
Special Requests: ADEQUATE

## 2019-08-31 LAB — T4, FREE: Free T4: 0.79 ng/dL (ref 0.61–1.12)

## 2019-08-31 LAB — T3, FREE: T3, Free: 1.1 pg/mL — ABNORMAL LOW (ref 2.0–4.4)

## 2019-08-31 MED ORDER — SODIUM CHLORIDE 0.9% FLUSH: 10.0000 mL | INTRAVENOUS | Status: DC | PRN

## 2019-08-31 MED ORDER — SODIUM CHLORIDE 0.9 % IV SOLN
INTRAVENOUS | Status: DC
  Administered 2019-08-31 – 2019-09-02 (×3): via INTRAVENOUS

## 2019-08-31 MED ORDER — SODIUM CHLORIDE 0.9% FLUSH
10.0000 mL | Freq: Two times a day (BID) | INTRAVENOUS | Status: DC
  Administered 2019-08-31 – 2019-09-01 (×2): 10 mL
  Administered 2019-09-02: 11:00:00 3 mL
  Administered 2019-09-02: 23:00:00 10 mL

## 2019-08-31 NOTE — Progress Notes (Signed)
Nutrition Follow-up  DOCUMENTATION CODES:   Severe malnutrition in context of chronic illness  INTERVENTION:  -Continue Ensure Enlive po BID, each supplement provides 350 kcal and 20 grams of protein  -Continue Prostat 30 ml po BID, each supplement provides 100 kcal and 15 grams of protein  -Continue MVI with minerals daily   NUTRITION DIAGNOSIS:   Severe Malnutrition related to chronic illness(concern for lymphoma) as evidenced by severe fat depletion, severe muscle depletion.  Ongoing  GOAL:   Patient will meet greater than or equal to 90% of their needs  Progressing  MONITOR:   PO intake, Supplement acceptance, Labs, Weight trends, I & O's  REASON FOR ASSESSMENT:   Consult Assessment of nutrition requirement/status, Hip fracture protocol  ASSESSMENT:  RD working remotely.  58 year old male who presented to the ED on 11/17 with SOB and BLE edema. Pt was recently homeless until a couple of weeks ago. CTA with concern for lymphoma to multiple sites. Pt admitted with severe sepsis.  11/18 - ultrasound-guided biopsy of right inguinal lymph node 11/19 - s/p bone marrow biopsy 11/22 - thoracentesis; yielded 550 ml yellow fluid  12/1 - right IJ port-a-cath  Per chart review, oncology planning to start outpatient Rituxan for lymphoma s/p 2 failed inpatient attempts related to pts extreme anxiety. Patient underwent chemo port placement and d/t  persistent fevers and tachycardia during admission, MD continues to recommended starting chemotherapy as inpatient, but patient is not agreeable. Most likely to discharge home tomorrow if he remains hemodynamically stable.  Patient currently consuming 100% of most meals (81% average of last 8 recorded) Appetite improving during admission. Patient is drinking Ensure supplement and taking Prostat twice daily.  Admit wt 155 lbs.    Current wt 143.7 lbs.  I/Os: -15302 ml since admit    -1638 ml x 24 hrs UOP: 2050 ml x 24  hrs  Medications reviewed and include: Ensure BID, Prostat BID, Folic acid, MVI with minerals, Lasix 20 mg daily Labs: WBC 3.7, Hgb 7.7 s/p 5 PRBC units during admission  Diet Order:   Diet Order            Diet regular Room service appropriate? Yes; Fluid consistency: Thin  Diet effective now              EDUCATION NEEDS:   Education needs have been addressed  Skin:  Skin Assessment: Reviewed RN Assessment  Last BM:  11/25  Height:   Ht Readings from Last 1 Encounters:  08/18/19 '5\' 11"'  (1.803 m)    Weight:   Wt Readings from Last 1 Encounters:  08/31/19 65.3 kg    Ideal Body Weight:  78.2 kg  BMI:  Body mass index is 20.08 kg/m.  Estimated Nutritional Needs:   Kcal:  2100-2300  Protein:  100-115 grams  Fluid:  >/= 2.0 L   Isaac Pierce, RD, LDN Clinical Nutrition [facility] Telephone [phone] After Hours/Weekend Pager: [phone]

## 2019-08-31 NOTE — Progress Notes (Addendum)
HEMATOLOGY-ONCOLOGY PROGRESS NOTE  SUBJECTIVE: Port-A-Cath placed 08/30/2019.  Tolerated the procedure well.  The patient continues to have intermittent fevers and tachycardia.  Still very fatigued and weak.  Denies chest pain and has some intermittent shortness of breath.  Reports ongoing abdominal distention.  No nausea or vomiting.  REVIEW OF SYSTEMS:   Constitutional: Continues to have intermittent fevers Respiratory: Denies cough, dyspnea or wheezes Cardiovascular: Denies palpitation, chest discomfort Gastrointestinal: Reports abdominal distention.  Denies nausea, heartburn or change in bowel habits Skin: Denies abnormal skin rashes Lymphatics: Reports ongoing lymphadenopathy in his neck and groin Neurological:Denies numbness, tingling or new weaknesses Behavioral/Psych: Mood is stable, no new changes  Extremities: Reports improved lower extremity edema All other systems were reviewed with the patient and are negative.  I have reviewed the past medical history, past surgical history, social history and family history with the patient and they are unchanged from previous note.   PHYSICAL EXAMINATION: ECOG PERFORMANCE STATUS: 1 - Symptomatic but completely ambulatory  Vitals:   08/31/19 1223 08/31/19 1321  BP: (!) 132/59   Pulse: (!) 123 (!) 107  Resp: (!) 24   Temp: 99.8 F (37.7 C)   SpO2: 94%    Filed Weights   08/29/19 0435 08/30/19 0517 08/31/19 0649  Weight: 152 lb 5.4 oz (69.1 kg) 155 lb 8 oz (70.5 kg) 144 lb (65.3 kg)    Intake/Output from previous day: 11/30 0701 - 12/01 0700 In: 72 [Blood:312; IV Piggyback:100] Out: 2050 [Urine:2050]  GENERAL:alert, no distress and comfortable SKIN: Faint macular rash noted over abdomen EYES: normal, Conjunctiva are pink and non-injected, sclera clear OROPHARYNX:no exudate, no erythema and lips, buccal mucosa, and tongue normal  NECK: supple, thyroid normal size, non-tender, without nodularity LYMPH: Palpable cervical,  axillary, and inguinal lymphadenopathy LUNGS: clear to auscultation and percussion with normal breathing effort HEART: Tachycardic, no murmurs and trace bilateral lower extremity pitting edema ABDOMEN: Positive bowel sounds, distended, splenomegaly noted Musculoskeletal:no cyanosis of digits and no clubbing  NEURO: alert & oriented x 3 with fluent speech, no focal motor/sensory deficits  LABORATORY DATA:  I have reviewed the data as listed CMP Latest Ref Rng & Units 08/30/2019 08/29/2019 08/28/2019  Glucose 70 - 99 mg/dL 113(H) 85 74  BUN 6 - 20 mg/dL 23(H) 21(H) 23(H)  Creatinine 0.61 - 1.24 mg/dL 0.69 0.66 0.73  Sodium 135 - 145 mmol/L 133(L) 130(L) 131(L)  Potassium 3.5 - 5.1 mmol/L 4.1 3.8 4.4  Chloride 98 - 111 mmol/L 99 95(L) 93(L)  CO2 22 - 32 mmol/L _0 Calcium 8.9 - 10.3 mg/dL 10.4(H) 9.9 10.1  Total Protein 6.5 - 8.1 g/dL 4.2(L) 4.2(L) 4.4(L)  Total Bilirubin 0.3 - 1.2 mg/dL 0.9 1.0 0.8  Alkaline Phos 38 - 126 U/L 282(H) 259(H) 249(H)  AST 15 - 41 U/L 63(H) 72(H) 82(H)  ALT 0 - 44 U/L 37 41 44    Lab Results  Component Value Date   WBC 3.7 (L) 08/31/2019   HGB 7.7 (L) 08/31/2019   HCT 23.6 (L) 08/31/2019   MCV 88.4 08/31/2019   PLT 55 (L) 08/31/2019   NEUTROABS 1.0 (L) 08/31/2019    Dg Chest 1 View  Result Date: 08/22/2019 CLINICAL DATA:  Status post thoracentesis. EXAM: CHEST  1 VIEW COMPARISON:  Earlier today at 0400 hours. FINDINGS: 1550 hours. Patient rotated left. Midline trachea. Normal heart size. Interval resolution of left-sided pleural fluid. No right-sided effusion. No pneumothorax. Skin fold projects over the left lower chest. Significantly improved left  base airspace disease. Right infrahilar atelectasis remains. Again identified is a right midlung 7 mm nodule IMPRESSION: Interval left thoracentesis, without pneumothorax. Resolution of left lower lobe airspace disease, with mild right infrahilar atelectasis remaining. Suspicion of a 7 mm right  midlung nodule, as before. Electronically Signed   By: Abigail Miyamoto M.D.   On: 08/22/2019 16:17   Dg Chest 2 View  Result Date: 08/17/2019 CLINICAL DATA:  Shortness of breath EXAM: CHEST - 2 VIEW COMPARISON:  None. FINDINGS: Small bilateral pleural effusions. Mild basilar airspace disease. Borderline heart size with mild central vascular congestion. No pneumothorax. IMPRESSION: Mild central vascular congestion with small bilateral effusions. Atelectasis or mild pneumonia at the left greater than right lung base. Electronically Signed   By: Donavan Foil M.D.   On: 08/17/2019 19:57   Dg Abd 1 View  Result Date: 08/21/2019 CLINICAL DATA:  Abdominal swelling and discomfort. EXAM: ABDOMEN - 1 VIEW COMPARISON:  CT, 08/18/2019. FINDINGS: No bowel dilation to suggest obstruction. Bowel is displaced inferiorly by the markedly enlarged spleen. Soft tissues are poorly defined. No evidence of a renal or ureteral stone. There are scattered aortic and iliac artery atherosclerotic calcifications. IMPRESSION: 1. No bowel obstruction. 2. Enlarged spleen. Poorly defined soft tissues consistent with ascites as noted on the prior CT. Electronically Signed   By: Lajean Manes M.D.   On: 08/21/2019 14:20   Ct Angio Chest Pe W Or Wo Contrast  Result Date: 08/25/2019 CLINICAL DATA:  Tachycardia. 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 EXAM: CT ANGIOGRAPHY CHEST WITH CONTRAST TECHNIQUE: Multidetector CT imaging of the chest was performed using the standard protocol during bolus administration of intravenous contrast. Multiplanar CT image reconstructions and MIPs were obtained to evaluate the vascular anatomy. CONTRAST:  134m OMNIPAQUE IOHEXOL 350 MG/ML SOLN COMPARISON:  Chest radiograph dated 08/23/2019. CTA chest dated 08/17/2019. FINDINGS: Cardiovascular: Evaluation is constrained by respiratory motion, particularly in the bilateral lower lobes. Within that constraint, there is no evidence of pulmonary embolism to the segmental  level. No evidence of thoracic aortic aneurysm or dissection. The heart is normal in size. Moderate pericardial soft tissue/metastases inferiorly (series 4/image 73), unchanged, likely related to the patient's known lymphoproliferative disorder. Mediastinum/Nodes: Bulky thoracic lymphadenopathy, related to the patient's known lymphoproliferative disorder, including: --2.3 cm short axis high right paratracheal node (series 4/image 28) --3.0 cm short axis AP window node (series 4/image 35) --1.4 cm short axis right hilar node (series 4/image 42) --2.6 cm short axis subcarinal node (series 4/image 43) --2.1 cm short axis intralobar/right lower lobe node (series 4/image 55) Visualized thyroid is unremarkable. Lungs/Pleura: Small bilateral pulmonary nodules, most of which are pleural/subpleural, unchanged from recent CT, including a dominant 9 mm nodule in the posterior right lower lobe (series 6/image 36). Dominant central pulmonary nodule measures 8 mm in the left lower lobe (series 6/image 78). These findings are likely related to the patient's known lymphoma. Moderate left pleural effusion, stable versus mildly increased, partially loculated. Trace right pleural effusion, mildly decreased. Associated bilateral lower lobe atelectasis. No pneumothorax. Upper Abdomen: Visualized upper abdomen is notable for a small hiatal hernia, masses splenomegaly, and a splenic infarct, better evaluated on recent CT abdomen/pelvis. Musculoskeletal: Degenerative changes of the visualized thoracolumbar spine. Review of the MIP images confirms the above findings. IMPRESSION: No evidence of pulmonary embolism. Bulky thoracic lymphadenopathy, pericardial thickening/soft tissue, and bilateral pulmonary nodules, likely related to the patient's known B-cell lymphoma, grossly unchanged. Moderate left pleural effusion, stable versus mildly increased, partially loculated. Trace right pleural  effusion, mildly decreased. Associated bilateral  lower lobe atelectasis. Overall, there is no significant interval change from recent CTs. Electronically Signed   By: Julian Hy M.D.   On: 08/25/2019 22:00   Ct Angio Chest Pe W/cm &/or Wo Cm  Result Date: 08/17/2019 CLINICAL DATA:  Shortness of breath for 1 month EXAM: CT ANGIOGRAPHY CHEST WITH CONTRAST TECHNIQUE: Multidetector CT imaging of the chest was performed using the standard protocol during bolus administration of intravenous contrast. Multiplanar CT image reconstructions and MIPs were obtained to evaluate the vascular anatomy. CONTRAST:  16m OMNIPAQUE IOHEXOL 350 MG/ML SOLN COMPARISON:  Chest x-ray 08/17/2019 FINDINGS: Cardiovascular: Suboptimal contrast bolus timing and respiratory motion artifact degraded examination of the more distal pulmonary arterial tree. No filling defect within the main or lobar branch pulmonary arteries. No evidence of right heart strain. Heart size is normal. There is pericardial thickening. Thoracic aorta is normal in course and caliber. Mediastinum/Nodes: Bulky mediastinal, bilateral hilar, and bilateral axillary lymphadenopathy. Thyroid, trachea, and esophagus are grossly unremarkable. Lungs/Pleura: Small bilateral pleural effusions, left greater than right with associated compressive atelectasis. There are numerous bilateral noncalcified pulmonary nodules. Reference nodules include 10 mm right lower lobe juxtapleural nodule (series 6, image 88). 9 mm medial right lower lobe nodule (series 6, image 106). 7 mm anterior right middle lobe nodule (series 6, image 107). 7 mm left lower lobe nodule (series 6, image 90). 8 mm lingular nodule (series 6, image 92). No pneumothorax. Upper Abdomen: Hepatosplenomegaly within the visualized upper abdomen. Ill-defined area of low density within the peripheral aspect of the spleen measuring approximately 3.7 x 2.8 cm (series 4, image 135). Musculoskeletal: No chest wall abnormality. No acute or significant osseous findings.  Review of the MIP images confirms the above findings. IMPRESSION: 1. Limited exam. No filling defect to the lobar branch level to suggest pulmonary embolism. 2. Bulky mediastinal, bilateral hilar, and bilateral axillary lymphadenopathy. Findings concerning for primary lymphoproliferative process such as lymphoma. Metastatic disease secondary to unknown primary is also a consideration. 3. Multiple bilateral noncalcified pulmonary nodules measuring up to 10 mm, likely a result of the same process as listed above. 4. Small bilateral pleural effusions, left greater than right with associated compressive atelectasis. 5. Hepatosplenomegaly within the visualized upper abdomen with ill-defined area of low density within the peripheral aspect of the spleen measuring 3.7 x 2.8 cm. Findings are concerning for lymphomatous involvement versus metastatic disease. Alternatively, sequela of splenic injury could have a similar appearance. These results were called by telephone at the time of interpretation on 08/17/2019 at 8:49 pm to provider MARGAUX VENTER , who verbally acknowledged these results. Electronically Signed   By: NDavina PokeM.D.   On: 08/17/2019 20:51   [place]domen Complete  Result Date: 08/21/2019 CLINICAL DATA:  Abdominal pain and distention. History of alcohol and drug abuse. EXAM: ABDOMEN ULTRASOUND COMPLETE COMPARISON:  None. FINDINGS: Gallbladder: Small gallstones and sludge. At least 1 polyp noted measuring 3 mm. There is pericholecystic fluid. No sonographic Murphy's sign. Common bile duct: Diameter: 4 mm Liver: Liver appears mildly enlarged. Increased liver parenchymal echogenicity. No mass or focal lesion. Portal vein is patent on color Doppler imaging with normal direction of blood flow towards the liver. IVC: No abnormality visualized. Pancreas: Hypoechoic lesion either adjacent to or within the pancreatic head measuring 2.3 x 1.4 cm. This could reflect a pancreatic mass or adjacent enlarged  gastrohepatic ligament lymph node a less well-defined hypoechoic area is seen in the pancreas below this. No pancreatic  duct dilation. Spleen: Enlarged spleen measuring 24 x 9 x 23 cm. No splenic mass or focal lesion. Right Kidney: Length: 12.1 cm. Normal parenchymal echogenicity. There are hypo to anechoic cortical lesions that are consistent with cysts. Mild hydronephrosis. No stones. Left Kidney: Length: 12.5 cm. Echogenicity within normal limits. No mass or hydronephrosis visualized. Abdominal aorta: No aneurysm visualized. Other findings: Bilateral pleural effusions. IMPRESSION: 1. No convincing acute finding. There are small gallstones, gallbladder sludge and 1 small polyp as well as pericholecystic fluid, but no sonographic Murphy's sign. Gallbladder is also only mildly to moderately distended. No convincing acute cholecystitis. 2. Hepatosplenomegaly. Increased echogenicity of the liver consistent with hepatic steatosis. No liver mass. 3. Bilateral pleural effusions. 4. Mild right hydronephrosis. Hypoechoic to anechoic right renal masses that are likely cysts but not fully characterized. Electronically Signed   By: Lajean Manes M.D.   On: 08/21/2019 14:28   Ct Abdomen Pelvis W Contrast  Result Date: 08/18/2019 CLINICAL DATA:  58 year old with pancytopenia and chest lymphadenopathy. Concern for lymphoproliferative disorder. EXAM: CT ABDOMEN AND PELVIS WITH CONTRAST TECHNIQUE: Multidetector CT imaging of the abdomen and pelvis was performed using the standard protocol following bolus administration of intravenous contrast. CONTRAST:  110m OMNIPAQUE IOHEXOL 300 MG/ML  SOLN COMPARISON:  Chest CT 08/17/2019 FINDINGS: Lower chest: Small bilateral pleural effusions. Partial visualization of the right infrahilar lymphadenopathy. There may be markedly enlarged lymph nodes adjacent to the distal esophagus. Small amount of pericardial fluid. Again noted is interstitial thickening in the right lower lobe with  posterior consolidation in the right lower lobe. Again noted is a elongated nodular structure in the right lower lobe measuring 9 mm on sequence 6, image 20. Pleural-based nodules in the right lower lobe and right middle lobe are similar to the recent comparison examination. Again noted is a pleural-based nodule in the lingula. Focal pleural thickening along the base of the left major fissure is asimilar to the previous examination. Hepatobiliary: Normal appearance of the liver. No discrete liver lesions identified. Main portal venous system is patent. Gallbladder is decompressed. No significant biliary dilatation. Pancreas: Unremarkable. No pancreatic ductal dilatation or surrounding inflammatory changes. Spleen: Spleen is massive for size measuring 27.0 x 10.6 x 17.4 cm, splenic volume is 2489 mL. Scattered areas of non enhancement throughout the periphery of the spleen probably represent infarcts associated with the large size of the spleen. Indeterminate lesion along the left posterior aspect of spleen on sequence 2, image 44 that measures up to 3.8 cm. Small amount of fluid posterior to the spleen near the left hemidiaphragm. Adrenals/Urinary Tract: Adrenal glands are poorly characterized on this examination but no gross abnormality. Compression on the left kidney due to the splenomegaly. Negative for hydronephrosis. Question fullness of the right renal pelvis but this area is poorly characterized. Mild distention of the urinary bladder. High-density material in the urinary bladder could be related to recent chest CT. Stomach/Bowel: Cannot exclude a hiatal hernia. Stomach is compressed by lymph nodes and the splenomegaly. No evidence for acute bowel inflammation or bowel obstruction. Vascular/Lymphatic: Diffuse atherosclerotic calcifications in the abdominal aorta without aneurysm. Ectasias involving the celiac trunk, measuring roughly 1.1 cm. There is probably stenosis involving the proximal SMA but poorly  characterized on this examination. Venous structures are unremarkable. Evidence for enlarged lymph nodes in the gastrohepatic ligament region. Enlarged lymph nodes in the periaortic region. Enlarged lymph nodes along the iliac nodal chains. Markedly enlarged lymph nodes in the right groin. There is a index lymph node in  the right groin that measures 2.8 x 2.1 cm on sequence 2, image 78. Multiple small lymph nodes in both inguinal regions. Reproductive: Prostate is unremarkable. Other: Small amount of free fluid in the pelvis. There appears to be free fluid in the left lower quadrant below the spleen. Negative for free air. Diffuse subcutaneous edema. Dependent fluid in the paraspinal tissues in the lumbar spine. Musculoskeletal: Disc space narrowing at L5-S1. No suspicious bone lesions. IMPRESSION: 1. Massive splenomegaly with abdominal and inguinal lymphadenopathy. Findings are suggestive for a lymphoproliferative process such as lymphoma. 2. Evidence for multiple splenic infarcts likely related to the splenomegaly. Cannot exclude a splenic lesion along the left posterior aspect measuring up to 3.8 cm. 3. Evidence for fluid overload state with diffuse subcutaneous edema, bilateral pleural effusions and ascites. 4. Re-demonstration pleural-based nodularity at the lung bases. There is also septal thickening and a nodular structure in the right lower lobe. Findings are compatible with a neoplastic or lymphoproliferative process. 5. No discrete liver lesions. 6. Aortic Atherosclerosis (ICD10-I70.0). Concern for at least mild stenosis involving the proximal SMA. Ectasia of the celiac trunk. Electronically Signed   By: Markus Daft M.D.   On: 08/18/2019 12:59   Ct Biopsy  Result Date: 08/19/2019 INDICATION: 58 year old male with pancytopenia. He presents for bone marrow biopsy as an inpatient. EXAM: CT GUIDED BONE MARROW ASPIRATION AND CORE BIOPSY Interventional Radiologist:  Criselda Peaches, MD MEDICATIONS: None.  ANESTHESIA/SEDATION: 2 mg Versed were administered for anxiolysis. This does not constitute moderate sedation. FLUOROSCOPY TIME:  None. COMPLICATIONS: None immediate. Estimated blood loss: <25 mL PROCEDURE: Informed written consent was obtained from the patient after a thorough discussion of the procedural risks, benefits and alternatives. All questions were addressed. Maximal Sterile Barrier Technique was utilized including caps, mask, sterile gowns, sterile gloves, sterile drape, hand hygiene and skin antiseptic. A timeout was performed prior to the initiation of the procedure. The patient was positioned prone and non-contrast localization CT was performed of the pelvis to demonstrate the iliac marrow spaces. Maximal barrier sterile technique utilized including caps, mask, sterile gowns, sterile gloves, large sterile drape, hand hygiene, and betadine prep. Under sterile conditions and local anesthesia, an 11 gauge coaxial bone biopsy needle was advanced into the right iliac marrow space. Needle position was confirmed with CT imaging. Initially, bone marrow aspiration was performed. Next, the 11 gauge outer cannula was utilized to obtain a right iliac bone marrow core biopsy. Needle was removed. Hemostasis was obtained with compression. The patient tolerated the procedure well. Samples were prepared with the cytotechnologist. IMPRESSION: Technically successful CT-guided bone marrow aspiration and biopsy of the right iliac bone. Electronically Signed   By: Jacqulynn Cadet M.D.   On: 08/19/2019 10:38   Dg Chest Port 1 View  Result Date: 08/23/2019 CLINICAL DATA:  Shortness of breath EXAM: PORTABLE CHEST 1 VIEW COMPARISON:  Radiograph 08/22/2019 FINDINGS: Mild left anterior obliquity. Redemonstration of a 9 mm nodule in the right mid lung, seen on comparison CT. Stable bandlike areas of atelectasis and/or scarring. Additional hazy basilar atelectatic changes are present. No significant reaccumulation of the  left effusion. No right effusion. Cardiomediastinal contours are stable. No acute osseous or soft tissue abnormality. IMPRESSION: 1. Unchanged 9 mm nodule in the right mid lung, seen on comparison CT. 2. No significant reaccumulation of the left effusion post thoracentesis. 3. Stable bandlike areas of atelectasis and/or scarring. Electronically Signed   By: Lovena Le M.D.   On: 08/23/2019 06:00   Dg Chest [facility]  1 View  Result Date: 08/22/2019 CLINICAL DATA:  Shortness of breath. EXAM: PORTABLE CHEST 1 VIEW COMPARISON:  08/21/2019 FINDINGS: 0400 hours. Leftward patient rotation. The cardio pericardial silhouette is enlarged. Interval progression of retrocardiac left base collapse/consolidation with persistent small left effusion. Infrahilar right base atelectasis or infiltrate noted. Nodular density noted at the left base may be a nipple shadow. IMPRESSION: Rotated film with retrocardiac left base collapse/consolidation and effusion. Small nodular density at the right base superimposed on the right fifth rib. Attention on follow-up recommended. Electronically Signed   By: Misty Stanley M.D.   On: 08/22/2019 07:03   Dg Chest Port 1 View  Result Date: 08/21/2019 CLINICAL DATA:  Shortness of breath. EXAM: PORTABLE CHEST 1 VIEW COMPARISON:  08/20/2019 FINDINGS: 0426 hours. Cardiopericardial silhouette is at upper limits of normal for size. Small bilateral pleural effusions are again noted with bibasilar collapse/consolidation. The visualized bony structures of the thorax are intact. Telemetry leads overlie the chest. IMPRESSION: Stable exam. Small bilateral pleural effusions with bibasilar collapse/consolidation. Electronically Signed   By: Misty Stanley M.D.   On: 08/21/2019 07:21   Dg Chest Port 1 View  Result Date: 08/20/2019 CLINICAL DATA:  Shortness of breath EXAM: PORTABLE CHEST 1 VIEW COMPARISON:  Two days ago FINDINGS: Normal heart size. Haziness of the bilateral lower chest from layering pleural  fluid with atelectasis by recent CT. No air bronchogram. No pneumothorax. IMPRESSION: Pleural effusions and atelectasis that have increased from 2 days ago. Electronically Signed   By: Monte Fantasia M.D.   On: 08/20/2019 07:33   Dg Chest Port 1 View  Result Date: 08/18/2019 CLINICAL DATA:  Shortness of breath EXAM: PORTABLE CHEST 1 VIEW COMPARISON:  08/18/2019 FINDINGS: Heart is normal size. Bilateral perihilar and lower lobe opacities. No visible effusions. No acute bony abnormality. IMPRESSION: Perihilar and lower lobe opacities could reflect atelectasis, edema or infection. Electronically Signed   By: Rolm Baptise M.D.   On: 08/18/2019 21:19   Dg Chest Port 1 View  Result Date: 08/18/2019 CLINICAL DATA:  Dyspnea. EXAM: PORTABLE CHEST 1 VIEW COMPARISON:  August 17, 2019. FINDINGS: Stable cardiomediastinal silhouette. No pneumothorax is noted. Mild bibasilar atelectasis or edema is noted with probable small pleural effusions. Bony thorax is unremarkable. IMPRESSION: Mild bibasilar atelectasis or edema is noted with probable small pleural effusions. Electronically Signed   By: Marijo Conception M.D.   On: 08/18/2019 08:27   Ct Bone Marrow Biopsy & Aspiration  Result Date: 08/19/2019 INDICATION: 58 year old male with pancytopenia. He presents for bone marrow biopsy as an inpatient. EXAM: CT GUIDED BONE MARROW ASPIRATION AND CORE BIOPSY Interventional Radiologist:  Criselda Peaches, MD MEDICATIONS: None. ANESTHESIA/SEDATION: 2 mg Versed were administered for anxiolysis. This does not constitute moderate sedation. FLUOROSCOPY TIME:  None. COMPLICATIONS: None immediate. Estimated blood loss: <25 mL PROCEDURE: Informed written consent was obtained from the patient after a thorough discussion of the procedural risks, benefits and alternatives. All questions were addressed. Maximal Sterile Barrier Technique was utilized including caps, mask, sterile gowns, sterile gloves, sterile drape, hand hygiene and  skin antiseptic. A timeout was performed prior to the initiation of the procedure. The patient was positioned prone and non-contrast localization CT was performed of the pelvis to demonstrate the iliac marrow spaces. Maximal barrier sterile technique utilized including caps, mask, sterile gowns, sterile gloves, large sterile drape, hand hygiene, and betadine prep. Under sterile conditions and local anesthesia, an 11 gauge coaxial bone biopsy needle was advanced into the right iliac marrow  space. Needle position was confirmed with CT imaging. Initially, bone marrow aspiration was performed. Next, the 11 gauge outer cannula was utilized to obtain a right iliac bone marrow core biopsy. Needle was removed. Hemostasis was obtained with compression. The patient tolerated the procedure well. Samples were prepared with the cytotechnologist. IMPRESSION: Technically successful CT-guided bone marrow aspiration and biopsy of the right iliac bone. Electronically Signed   By: Jacqulynn Cadet M.D.   On: 08/19/2019 10:38   [place] Core Biopsy (lymph Nodes)  Result Date: 08/18/2019 INDICATION: No known primary, now with extensive lymphadenopathy and splenomegaly worrisome for lymphoma. Please perform ultrasound-guided right inguinal lymph node biopsy for tissue diagnostic purposes. EXAM: ULTRASOUND-GUIDED RIGHT INGUINAL LYMPH NODE BIOPSY COMPARISON:  Chest CT-08/17/2019; CT abdomen and pelvis-earlier same day MEDICATIONS: None ANESTHESIA/SEDATION: None COMPLICATIONS: None immediate. TECHNIQUE: Informed written consent was obtained from the patient after a discussion of the risks, benefits and alternatives to treatment. Questions regarding the procedure were encouraged and answered. Initial ultrasound scanning demonstrated 2 adjacent pathologically enlarged right inguinal lymph nodes compatible with the findings seen on preceding abdominal CT. The dominant approximately 2.7 x 1.6 cm right inguinal lymph node was targeted for biopsy  given location and sonographic window (image 7). An ultrasound image was saved for documentation purposes. The procedure was planned. A timeout was performed prior to the initiation of the procedure. The operative was prepped and draped in the usual sterile fashion, and a sterile drape was applied covering the operative field. A timeout was performed prior to the initiation of the procedure. Local anesthesia was provided with 1% lidocaine with epinephrine. Under direct ultrasound guidance, an 18 gauge core needle device was utilized to obtain to obtain 6 core needle biopsies of the dominant right inguinal lymph node. The samples were placed in saline and submitted to pathology. The needle was removed and hemostasis was achieved with manual compression. Post procedure scan was negative for significant hematoma. A dressing was placed. The patient tolerated the procedure well without immediate postprocedural complication. IMPRESSION: Technically successful ultrasound guided core needle biopsy of dominant right inguinal lymph node. Electronically Signed   By: Sandi Mariscal M.D.   On: 08/18/2019 13:39   [facility] Guided Port Insertion  Result Date: 08/30/2019 INDICATION: Recent diagnosis of B cell lymphoma. In need of durable intravenous access for chemotherapy administration. EXAM: IMPLANTED PORT A CATH PLACEMENT WITH ULTRASOUND AND FLUOROSCOPIC GUIDANCE COMPARISON:  Chest CT - 08/25/2019 MEDICATIONS: Ancef 2 gm IV; The antibiotic was administered within an appropriate time interval prior to skin puncture. ANESTHESIA/SEDATION: Moderate (conscious) sedation was employed during this procedure. A total of Versed 2 mg and Fentanyl 100 mcg was administered intravenously. Moderate Sedation Time: 23 minutes. The patient's level of consciousness and vital signs were monitored continuously by radiology nursing throughout the procedure under my direct supervision. CONTRAST:  None FLUOROSCOPY TIME:  18 seconds (4.6 mGy)  COMPLICATIONS: None immediate. PROCEDURE: The procedure, risks, benefits, and alternatives were explained to the patient. Questions regarding the procedure were encouraged and answered. The patient understands and consents to the procedure. The right neck and chest were prepped with chlorhexidine in a sterile fashion, and a sterile drape was applied covering the operative field. Maximum barrier sterile technique with sterile gowns and gloves were used for the procedure. A timeout was performed prior to the initiation of the procedure. Local anesthesia was provided with 1% lidocaine with epinephrine. After creating a small venotomy incision, a micropuncture kit was utilized to access the internal jugular vein. Real-time  ultrasound guidance was utilized for vascular access including the acquisition of a permanent ultrasound image documenting patency of the accessed vessel. The microwire was utilized to measure appropriate catheter length. A subcutaneous port pocket was then created along the upper chest wall utilizing a combination of sharp and blunt dissection. The pocket was irrigated with sterile saline. A single lumen ISP power injectable port was chosen for placement. The 8 Fr catheter was tunneled from the port pocket site to the venotomy incision. The port was placed in the pocket. The external catheter was trimmed to appropriate length. At the venotomy, an 8 Fr peel-away sheath was placed over a guidewire under fluoroscopic guidance. The catheter was then placed through the sheath and the sheath was removed. Final catheter positioning was confirmed and documented with a fluoroscopic spot radiograph. The port was accessed with a Huber needle, aspirated and flushed with heparinized saline. The venotomy site was closed with an interrupted 4-0 Vicryl suture. The port pocket incision was closed with interrupted 2-0 Vicryl suture and the skin was opposed with a running subcuticular 4-0 Vicryl suture. Dermabond and  Steri-strips were applied to both incisions. Dressings were placed. The patient tolerated the procedure well without immediate post procedural complication. FINDINGS: After catheter placement, the tip lies within the superior cavoatrial junction. The catheter aspirates and flushes normally and is ready for immediate use. IMPRESSION: Successful placement of a right internal jugular approach power injectable Port-A-Cath. The catheter is ready for immediate use. Electronically Signed   By: Sandi Mariscal M.D.   On: 08/30/2019 16:58   Vas [place] Lower Extremity Venous (dvt)  Result Date: 08/18/2019  Lower Venous Study Indications: Elevated d dimer.  Comparison Study: no prior Performing Technologist: Abram Sander RVS  Examination Guidelines: A complete evaluation includes B-mode imaging, spectral Doppler, color Doppler, and power Doppler as needed of all accessible portions of each vessel. Bilateral testing is considered an integral part of a complete examination. Limited examinations for reoccurring indications may be performed as noted.  +---------+---------------+---------+-----------+----------+--------------+ RIGHT    CompressibilityPhasicitySpontaneityPropertiesThrombus Aging +---------+---------------+---------+-----------+----------+--------------+ CFV      Full           Yes      Yes                                 +---------+---------------+---------+-----------+----------+--------------+ SFJ      Full                                                        +---------+---------------+---------+-----------+----------+--------------+ FV Prox  Full                                                        +---------+---------------+---------+-----------+----------+--------------+ FV Mid   Full                                                        +---------+---------------+---------+-----------+----------+--------------+ FV DistalFull                                                         +---------+---------------+---------+-----------+----------+--------------+  PFV      Full                                                        +---------+---------------+---------+-----------+----------+--------------+ POP      Full           Yes      Yes                                 +---------+---------------+---------+-----------+----------+--------------+ PTV      Full                                                        +---------+---------------+---------+-----------+----------+--------------+ PERO     Full                                                        +---------+---------------+---------+-----------+----------+--------------+   +---------+---------------+---------+-----------+----------+--------------+ LEFT     CompressibilityPhasicitySpontaneityPropertiesThrombus Aging +---------+---------------+---------+-----------+----------+--------------+ CFV      Full           Yes      Yes                                 +---------+---------------+---------+-----------+----------+--------------+ SFJ      Full                                                        +---------+---------------+---------+-----------+----------+--------------+ FV Prox  Full                                                        +---------+---------------+---------+-----------+----------+--------------+ FV Mid   Full                                                        +---------+---------------+---------+-----------+----------+--------------+ FV DistalFull                                                        +---------+---------------+---------+-----------+----------+--------------+ PFV      Full                                                        +---------+---------------+---------+-----------+----------+--------------+  POP      Full           Yes      Yes                                  +---------+---------------+---------+-----------+----------+--------------+ PTV      Full                                                        +---------+---------------+---------+-----------+----------+--------------+ PERO     Full                                                        +---------+---------------+---------+-----------+----------+--------------+     Summary: Right: There is no evidence of deep vein thrombosis in the lower extremity. No cystic structure found in the popliteal fossa. Left: There is no evidence of deep vein thrombosis in the lower extremity. No cystic structure found in the popliteal fossa.  *See table(s) above for measurements and observations. Electronically signed by Harold Barban MD on 08/18/2019 at 1:53:01 PM.    Final    US Thoracentesis Asp Pleural Space W/img Guide  Result Date: 08/24/2019 INDICATION: Patient with shortness of breath, left pleural effusion. Request made for diagnostic and therapeutic left thoracentesis. EXAM: ULTRASOUND GUIDED DIAGNOSTIC AND THERAPEUTIC LEFT THORACENTESIS MEDICATIONS: 10 mL 1% lidocaine COMPLICATIONS: None immediate. PROCEDURE: An ultrasound guided thoracentesis was thoroughly discussed with the patient and questions answered. The benefits, risks, alternatives and complications were also discussed. The patient understands and wishes to proceed with the procedure. Written consent was obtained. Ultrasound was performed to localize and mark an adequate pocket of fluid in the left chest. The area was then prepped and draped in the normal sterile fashion. 1% Lidocaine was used for local anesthesia. Under ultrasound guidance a 6 Fr Safe-T-Centesis catheter was introduced. Thoracentesis was performed. The catheter was removed and a dressing applied. FINDINGS: A total of approximately 550 mL of yellow fluid was removed. Samples were sent to the laboratory as requested by the clinical team. IMPRESSION: Successful ultrasound guided  left thoracentesis yielding 550 mL of pleural fluid. Read by: Brynda Greathouse PA-C Electronically Signed   By: Jerilynn Mages.  Shick M.D.   On: 08/22/2019 16:59    ASSESSMENT AND PLAN: 1.    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  The patient presented with bulky lymphadenopathy. 2.  Pancytopenia 3.  Lactic acidosis/sepsis, resolved 4.  Dyspnea secondary to left greater than right pleural effusions, resolved 5.  Hyponatremia, improved 6.  Anxiety 7.  Chronic back pain 8.  Tobacco dependence 9.  Hypercalcemia  -Right inguinal lymph node biopsy consistent with non-Hodgkin B-cell lymphoma.  Bone marrow biopsy most consistent with chronic lymphocytic leukemia/small lymphocytic lymphoma-fish (B-cell panel) is pending, and pleural fluid showed atypical lymphocytes suspicious for lymphoproliferative process.  He received a partial dose of rituximab with an infusion reaction.  He was not rechallenged.  Unfortunately, he continues to have persistent intermittent fevers likely related to his underlying lymphoma.  Due to his fevers, he is not stable for discharge.  Discussed with the patient today proceeding with  chemotherapy as an inpatient.  Chemotherapy would consist of R-CHOP.  Adverse effects of the treatment had a discussed with the patient including but not limited to alopecia, myelosuppression, nausea and vomiting, cardiotoxicity, nephrotoxicity, and hepatotoxicity.  Additionally, rituximab can cause an infusion reaction.  He did have a reaction with his first dose and will plan to heavily premedicate him prior to rituximab. Echocardiogram was obtained on 08/18/2019 and showed LVEF of 60-65%.  Anticipate chemotherapy administration on 09/01/2019. -The patient is at high risk for tumor lysis syndrome due to bulky lymphadenopathy. Continue Allopurinol 300 mg daily.  We will start him on normal saline at 50 cc/h. -The patient presented with pancytopenia likely due to bone marrow involvement of his  lymphoma.  Hemoglobin 7.7 today recommend PRBC transfusion for hemoglobin less than 8.  Recommend platelet transfusion if platelets drop below 20,000 or active bleeding.    Repeat CBC with differential in the morning. -He was counseled about smoking cessation. -Corrected calcium from 08/30/2019 is 11.8.  Likely due to underlying malignancy.  We will begin IV fluids as above.  Recheck CMET in the a.m.   LOS: 14 days   Mikey Bussing, DNP, AGPCNP-BC, AOCNP 08/31/19   ADDENDUM: Hematology/Oncology Attending: I had a face-to-face encounter with the patient today.  I agree with the above note.  I recommended his care plan.  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 The patient was tried on single dose Rituxan last week but unfortunately he has hypersensitivity reaction and this was discontinued. We were planning to start his treatment with CHOP/Rituxan on outpatient basis but the patient is too weak to go home at this point.  We will consider him for a reduced dose CHOP Rituxan tomorrow in the hospital to try to control his disease before discharge. We will continue to monitor for tumor lysis with his treatment. Thank you so much for taking good care of Mr. Nakama.  I will continue to follow-up with the patient with you and assist in his management.  Disclaimer: This note was dictated with voice recognition software. Similar sounding words can inadvertently be transcribed and may be missed upon review. Eilleen Kempf, MD

## 2019-08-31 NOTE — Progress Notes (Signed)
PROGRESS NOTE    Isaac Pierce  SHF:[id number] DOB: 05-Dec-1960 DOA: 08/17/2019 PCP: Patient, No Pcp Per   Brief Narrative:  Patient is a 58 year old male with history of chronic back pain, GERD, anxiety, homelessness who initially presented with shortness of breath.  He complained of generalized weakness, loss of appetite, shortness of breath, productive cough, recent weight loss, leg swelling and distended abdomen when he presented.  He also noticed lumps underneath his axilla and neck.  He has family history of lymphoma.  He was smoking 1 to 2 packs of cigarettes a day.  When he presented he was tachycardic.  Chest x-ray showed mild vascular congestion, small bilateral effusion.  CT angiogram was negative for PE but showed bulky mediastinal, bilateral hilar, bilateral axillary lymphadenopathy.  Findings are concerning for primary lymphoproliferative process such as lymphoma.  Also found to have noncalcified pulmonary nodules measuring up to 10 mm and also hepatosplenomegaly.  He was also anemic on presentation so he was transfused with PRBCs. Total of 5 units of PRBCs during this hospitalization.    Oncology consulted.  Hospital course remarkable for respiratory distress which improved with Lasix.  He underwent biopsy of the right inguinal lymph node and bone marrow biopsy. He is persistently febrile due to lymphoma.  Also underwent thoracentesis for left-sided pleural effusion.  Bone marrow biopsy report showed atypical lymphocytes suspicious for lymphoproliferative disease.   Oncology planning  to start on Rituxan  for lymphoma as an outpatient and failed to start here as inpatient after attempting twice due to extreme anxiety. Underwent chemo port placement.  Due to persistent fever, tachycardia, I recommended to start on chemotherapy here as an inpatient.  Oncology considering it but apparently patient is not agreeable.  Assessment & Plan:   Principal Problem:   Severe sepsis ([facility]) Active  Problems:   Malignancy (HCC)   Pancytopenia (HCC)   Dyspnea   Pleural effusion   Hyponatremia   Protein-calorie malnutrition, severe   Non-Hodgkin lymphoma (HCC)   Pressure injury of skin   Lymphoproliferative disorder/lymphoma: Presented with 5-day history of dyspnea on exertion, nausea, significant weight loss, diffuse lymphadenopathy.  CT angiogram showed bulky mediastinal, bilateral hilar, bilateral axillary lymphadenopathy.  Also found to have pulmonary nodules, hepatosplenomegaly.  Underwent CT abdomen/pelvis for further staging which showed massive splenomegaly with abdominal and inguinal lymphadenopathy.  Underwent ultrasound-guided liver biopsy by IR of right inguinal lymph node which showed monoclonal CD5 positive B-cell population.  Also underwent CT-guided aspiration and core biopsy of right iliac bone which  showed atypical lymphocytes suspicious for lymphoproliferative disease. Oncology following and planning to start on Rituxan as an outpatient.  Concern for sepsis/lactic acidosis/fever: Lactic acid was elevated on presentation.  Currently improved.  He is persistently febrile from his lymphoma.  Blood cultures have been negative so far.  Antibiotics have been discontinued.  LA improved.  One of the blood culture set showed gram-positive cocci most likely contaminant.  Chest x-ray does not show any clear pneumonia.  Repeat blood cultures are negative.  Persistent sinus tachycardia: Heart rate in the range of 110-1 20.  Echocardiogram done here shows normal ejection fraction, no significant other findings.  TSH is elevated.  Low T3, but normal T4.Most likely this is euthyroid sick syndrome.   He is in sinus tachycardia most likely secondary to combination of anxiety, fever.  Started on low-dose metoprolol.  Pancytopenia: Has leukopenia,anemia,thrombocytopenia.  So far transfused with a total of 5 units of PRBC.  Also has thrombocytopenia.  Will transfuse with  platelets if platelets  drop below 20000.  Continue folic acid.  Avoid anticoagulation.  Dyspnea/left-sided pleural effusion: Underwent thoracentesis by IR with removal of 550 mL of yellow fluid.  Currently does not complain of any shortness of breath.  Saturating fine on room air.  Hyponatremia: Improved  Bilateral lower extremity edema/anasarca/concern for DVT: Significantly improved with Lasix.  Anasarca secondary to low albumin from  Malignancy,poor appetite.  D-dimer was elevated at 5.  CT angiogram negative for PE.  Doppler did not show any DVT.On lasix 40 mg IV Echocardiogram showed normal ejection fraction.Changed  lasix to oral.  Tobacco abuse: Counseled cessation.  Continue nicotine patch.  GERD: Continue PPI  Elevated liver enzymes: Due to abdominal lymphadenopathy.  Continue to monitor.  Abdominal distention: Secondary to lymphadenopathy, hepatosplenomegaly,ascites.  Denies any abdominal pain today.  Severe protein calorie malnutrition: Dietitian following.        Nutrition Problem: Severe Malnutrition Etiology: chronic illness(concern for lymphoma)      DVT prophylaxis:SCD Code Status: Full Family Communication: Called sister on phone 08/30/19.  Discussed with father on phone on 08/31/19 Disposition Plan: Plan for starting chemotherapy as an inpatient which patient denies.  Most likely discharge to home tomorrow if remains hemodynamically stable.   Consultants: Oncology, PCCM, IR  Procedures: Lymph node biopsy, bone marrow biopsy, thoracentesis  Antimicrobials:  Anti-infectives (From admission, onward)   Start     Dose/Rate Route Frequency Ordered Stop   08/30/19 1500  ceFAZolin (ANCEF) IVPB 2g/100 mL premix     2 g 200 mL/hr over 30 Minutes Intravenous To Radiology 08/30/19 1014 08/30/19 1612   08/18/19 2200  ceFEPIme (MAXIPIME) 2 g in sodium chloride 0.9 % 100 mL IVPB     2 g 200 mL/hr over 30 Minutes Intravenous Every 8 hours 08/18/19 1534 08/23/19 2255   08/18/19 1200   vancomycin (VANCOCIN) 1,250 mg in sodium chloride 0.9 % 250 mL IVPB  Status:  Discontinued     1,250 mg 166.7 mL/hr over 90 Minutes Intravenous Every 12 hours 08/18/19 0621 08/19/19 1733   08/18/19 0600  piperacillin-tazobactam (ZOSYN) IVPB 3.375 g     3.375 g 12.5 mL/hr over 240 Minutes Intravenous Every 8 hours 08/17/19 2251 08/18/19 1800   08/17/19 2300  vancomycin (VANCOCIN) 1,500 mg in sodium chloride 0.9 % 500 mL IVPB     1,500 mg 250 mL/hr over 120 Minutes Intravenous  Once 08/17/19 2250 08/18/19 0332   08/17/19 2300  piperacillin-tazobactam (ZOSYN) IVPB 3.375 g     3.375 g 100 mL/hr over 30 Minutes Intravenous  Once 08/17/19 2250 08/18/19 0131      Subjective:  Patient seen and examined the bedside this morning.  Hemodynamically stable.  Looks weak, deconditioned.  He was talking to his father on phone.  Denies any specific complaints.  Objective: Vitals:   08/30/19 2031 08/31/19 0649 08/31/19 1223 08/31/19 1321  BP: (!) 108/55 118/62 (!) 132/59   Pulse: (!) 129 (!) 125 (!) 123 (!) 107  Resp: 18 20 (!) 24   Temp: 98.2 F (36.8 C) 100.3 F (37.9 C) 99.8 F (37.7 C)   TempSrc: Oral Oral Oral   SpO2: (!) 88% 93% 94%   Weight:  65.3 kg    Height:        Intake/Output Summary (Last 24 hours) at 08/31/2019 1328 Last data filed at 08/31/2019 1200 Gross per 24 hour  Intake 412 ml  Output 575 ml  Net -163 ml   Filed Weights   08/29/19 0435 08/30/19  8756 08/31/19 0649  Weight: 69.1 kg 70.5 kg 65.3 kg    Examination:   General exam: Deconditioned, debilitated, thin, chronically ill looking HEENT:PERRL,Oral mucosa moist, Ear/Nose normal on gross exam Respiratory system: Mild Decreased air entry on the left side. Cardiovascular system: S1 & S2 heard, RRR. No JVD, murmurs, rubs, gallops or clicks. Gastrointestinal system: Abdomen is distended, soft and nontender. . Normal bowel sounds heard. Central nervous system: Alert and oriented. No focal neurological  deficits. Extremities:No  edema, no clubbing ,no cyanosis Skin: No rashes, lesions or ulcers,no icterus ,no pallor     Data Reviewed: I have personally reviewed following labs and imaging studies  CBC: Recent Labs  Lab 08/25/19 0756 08/26/19 0223  08/28/19 0601 08/28/19 1904 08/29/19 0632 08/29/19 1151 08/29/19 2250 08/30/19 0424 08/30/19 1037 08/31/19 0408  WBC 5.0 4.7   < > 4.6 5.4 4.4  --   --  4.5  --  3.7*  NEUTROABS 1.7 2.3  --   --   --   --   --   --   --   --  1.0*  HGB 7.5* 7.2*   < > 7.3* 8.2* 7.4* 8.4* 7.5* 7.3* 7.5* 7.7*  HCT 23.6* 23.2*   < > 22.1* 24.6* 22.3* 25.1* 22.6* 22.5* 22.7* 23.6*  MCV 88.1 89.6   < > 87.7 88.2 88.1  --   --  89.3  --  88.4  PLT 53* 43*   < > 50* 54* 45*  --   --  53*  --  55*   < > = values in this interval not displayed.   Basic Metabolic Panel: Recent Labs  Lab 08/26/19 0223 08/26/19 1123 08/28/19 0601 08/29/19 0632 08/30/19 0424  NA 133* 134* 131* 130* 133*  K 4.2 4.5 4.4 3.8 4.1  CL 94* 95* 93* 95* 99  CO2 _0 GLUCOSE 144* 257* 74 85 113*  BUN 31* 33* 23* 21* 23*  CREATININE 0.76 1.01 0.73 0.66 0.69  CALCIUM 9.4 9.9 10.1 9.9 10.4*   GFR: Estimated Creatinine Clearance: 95.2 mL/min (by C-G formula based on SCr of 0.69 mg/dL). Liver Function Tests: Recent Labs  Lab 08/26/19 0223 08/26/19 1123 08/28/19 0601 08/29/19 0632 08/30/19 0424  AST 64* 67* 82* 72* 63*  ALT 28 32 44 41 37  ALKPHOS 229* 230* 249* 259* 282*  BILITOT 0.9 1.0 0.8 1.0 0.9  PROT 4.6* 4.9* 4.4* 4.2* 4.2*  ALBUMIN 1.9* 2.0* 1.7* 1.8* 1.7*   No results for input(s): LIPASE, AMYLASE in the last 168 hours. No results for input(s): AMMONIA in the last 168 hours. Coagulation Profile: Recent Labs  Lab 08/30/19 1037  INR 1.2   Cardiac Enzymes: No results for input(s): CKTOTAL, CKMB, CKMBINDEX, TROPONINI in the last 168 hours. BNP (last 3 results) No results for input(s): PROBNP in the last 8760 hours. HbA1C: No results for  input(s): HGBA1C in the last 72 hours. CBG: No results for input(s): GLUCAP in the last 168 hours. Lipid Profile: No results for input(s): CHOL, HDL, LDLCALC, TRIG, CHOLHDL, LDLDIRECT in the last 72 hours. Thyroid Function Tests: Recent Labs    08/30/19 1010 08/30/19 1207 08/31/19 0408  TSH 9.169*  --   --   FREET4  --   --  0.79  T3FREE  --  1.1*  --    Anemia Panel: No results for input(s): VITAMINB12, FOLATE, FERRITIN, TIBC, IRON, RETICCTPCT in the last 72 hours. Sepsis Labs: No results for input(s): PROCALCITON,  LATICACIDVEN in the last 168 hours.  Recent Results (from the past 240 hour(s))  Body fluid culture     Status: None   Collection Time: 08/22/19  4:00 PM   Specimen: PATH Cytology Pleural fluid  Result Value Ref Range Status   Specimen Description   Final    PLEURAL Performed at [facility] [address] [place], [place] [address]    Special Requests   Final    NONE Performed at [facility], [place] [address] [place], [place] [address]    Gram Stain   Final    ABUNDANT WBC PRESENT, PREDOMINANTLY MONONUCLEAR NO ORGANISMS SEEN    Culture   Final    NO GROWTH 3 DAYS Performed at [facility] [address] [place], [place] [address]    Report Status 08/26/2019 FINAL  Final  Acid Fast Smear (AFB)     Status: None   Collection Time: 08/22/19  4:00 PM   Specimen: PATH Cytology Pleural fluid  Result Value Ref Range Status   AFB Specimen Processing Concentration  Final   Acid Fast Smear Negative  Final    Comment: (NOTE) Performed At: [facility] [place], [place] [id number] Rush Farmer MD [id number]    Source (AFB) PLEURAL  Final    Comment: Performed at [facility], [place] [address] [place], [place] [address]  MRSA PCR Screening     Status: None   Collection Time: 08/25/19 10:03 PM   Specimen: Nasal Mucosa; Nasopharyngeal  Result Value Ref Range Status    MRSA by PCR NEGATIVE NEGATIVE Final    Comment:        The GeneXpert MRSA Assay (FDA approved for NASAL specimens only), is one component of a comprehensive MRSA colonization surveillance program. It is not intended to diagnose MRSA infection nor to guide or monitor treatment for MRSA infections. Performed at [facility], [place] [address], [place], [place] [address]   Culture, blood (routine x 2)     Status: None   Collection Time: 08/26/19  7:49 AM   Specimen: BLOOD RIGHT ARM  Result Value Ref Range Status   Specimen Description   Final    BLOOD RIGHT ARM Performed at [facility] [address] [place], [place] [address]    Special Requests   Final    BOTTLES DRAWN AEROBIC AND ANAEROBIC Blood Culture adequate volume Performed at [facility] [address] [place], [place] [address]    Culture   Final    NO GROWTH 5 DAYS Performed at [facility], [place] [address], [place], [place] [address]    Report Status 08/31/2019 FINAL  Final  Culture, blood (routine x 2)     Status: None   Collection Time: 08/26/19  7:49 AM   Specimen: BLOOD RIGHT ARM  Result Value Ref Range Status   Specimen Description   Final    BLOOD RIGHT ARM Performed at [facility] [address], [place], [place] [address]    Special Requests   Final    BOTTLES DRAWN AEROBIC ONLY Blood Culture adequate volume Performed at [facility] [address], [place], [place] [address]    Culture   Final    NO GROWTH 5 DAYS Performed at [facility], [place] [address], [place], [place] [address]    Report Status 08/31/2019 FINAL  Final  Radiology Studies: Ir Imaging Guided Port Insertion  Result Date: 08/30/2019 INDICATION: Recent diagnosis of B cell lymphoma. In need of durable intravenous access for chemotherapy administration. EXAM: IMPLANTED PORT A CATH PLACEMENT WITH ULTRASOUND AND  FLUOROSCOPIC GUIDANCE COMPARISON:  Chest CT - 08/25/2019 MEDICATIONS: Ancef 2 gm IV; The antibiotic was administered within an appropriate time interval prior to skin puncture. ANESTHESIA/SEDATION: Moderate (conscious) sedation was employed during this procedure. A total of Versed 2 mg and Fentanyl 100 mcg was administered intravenously. Moderate Sedation Time: 23 minutes. The patient's level of consciousness and vital signs were monitored continuously by radiology nursing throughout the procedure under my direct supervision. CONTRAST:  None FLUOROSCOPY TIME:  18 seconds (4.6 mGy) COMPLICATIONS: None immediate. PROCEDURE: The procedure, risks, benefits, and alternatives were explained to the patient. Questions regarding the procedure were encouraged and answered. The patient understands and consents to the procedure. The right neck and chest were prepped with chlorhexidine in a sterile fashion, and a sterile drape was applied covering the operative field. Maximum barrier sterile technique with sterile gowns and gloves were used for the procedure. A timeout was performed prior to the initiation of the procedure. Local anesthesia was provided with 1% lidocaine with epinephrine. After creating a small venotomy incision, a micropuncture kit was utilized to access the internal jugular vein. Real-time ultrasound guidance was utilized for vascular access including the acquisition of a permanent ultrasound image documenting patency of the accessed vessel. The microwire was utilized to measure appropriate catheter length. A subcutaneous port pocket was then created along the upper chest wall utilizing a combination of sharp and blunt dissection. The pocket was irrigated with sterile saline. A single lumen ISP power injectable port was chosen for placement. The 8 Fr catheter was tunneled from the port pocket site to the venotomy incision. The port was placed in the pocket. The external catheter was trimmed to appropriate  length. At the venotomy, an 8 Fr peel-away sheath was placed over a guidewire under fluoroscopic guidance. The catheter was then placed through the sheath and the sheath was removed. Final catheter positioning was confirmed and documented with a fluoroscopic spot radiograph. The port was accessed with a Huber needle, aspirated and flushed with heparinized saline. The venotomy site was closed with an interrupted 4-0 Vicryl suture. The port pocket incision was closed with interrupted 2-0 Vicryl suture and the skin was opposed with a running subcuticular 4-0 Vicryl suture. Dermabond and Steri-strips were applied to both incisions. Dressings were placed. The patient tolerated the procedure well without immediate post procedural complication. FINDINGS: After catheter placement, the tip lies within the superior cavoatrial junction. The catheter aspirates and flushes normally and is ready for immediate use. IMPRESSION: Successful placement of a right internal jugular approach power injectable Port-A-Cath. The catheter is ready for immediate use. Electronically Signed   By: Sandi Mariscal M.D.   On: 08/30/2019 16:58        Scheduled Meds:  allopurinol  300 mg Oral Daily   Chlorhexidine Gluconate Cloth  6 each Topical Daily   feeding supplement (ENSURE ENLIVE)  237 mL Oral BID BM   feeding supplement (PRO-STAT SUGAR FREE 64)  30 mL Oral BID   folic acid  1 mg Oral Daily   furosemide  20 mg Oral Daily   hydrocortisone   Rectal TID   mouth rinse  15 mL Mouth Rinse BID   metoprolol tartrate  12.5 mg Oral BID   multivitamin with minerals  1 tablet Oral Daily  nicotine  7 mg Transdermal Daily   nystatin  5 mL Mouth/Throat QID   pantoprazole  20 mg Oral Daily   Continuous Infusions:  sodium chloride       LOS: 14 days    Time spent: 35 mins.More than 50% of that time was spent in counseling and/or coordination of care.      Shelly Coss, MD [facility] Pager  [phone]  If 7PM-7AM, please contact night-coverage [email or web address] Password [email or web address] 08/31/2019, 1:28 PM

## 2019-08-31 NOTE — Progress Notes (Signed)
Physical Therapy Treatment Patient Details Name: Isaac Pierce MRN: [id number] DOB: Sep 11, 1961 Today's Date: 08/31/2019    History of Present Illness Pt admitted with severe sepsis, hypnatremia, pleural effusion and non-hodgkin Lymphoma    PT Comments    Pt irritated and easily agitated during PT session, and remains pretty impulsive with mobility making pt high fall risk. Pt ambulated short hallway distance with RW, occasionally leaving RW behind with very unsteady and scissoring gait without bilateral UE support. Pt with tachycardia ~130 bpm with ambulation, with SpO2 post-ambulation WFL but pt presented with increased work of breathing throughout session. PT continuing to recommend HHPT, will continue to follow acutely.    Follow Up Recommendations  Home health PT     Equipment Recommendations  None recommended by PT    Recommendations for Other Services       Precautions / Restrictions Precautions Precautions: Fall Restrictions Weight Bearing Restrictions: No    Mobility  Bed Mobility Overal bed mobility: Needs Assistance Bed Mobility: Supine to Sit;Sit to Supine     Supine to sit: Min assist Sit to supine: Modified independent (Device/Increase time)   General bed mobility comments: Min assist via HHA to pull to sitting, pt able to lift LEs back into bed and scoot up in bed without PT assist. When PT attempted to assist pt in return to supine, pt states "I'll take care of me".  Transfers Overall transfer level: Needs assistance Equipment used: 1 person hand held assist Transfers: Sit to/from Stand Sit to Stand: Min assist         General transfer comment: Min assist for power up, steadying upon standing as pt with significant swaying especially when attempting to don mask and upon initial stand.  Ambulation/Gait Ambulation/Gait assistance: Min guard;Min assist Gait Distance (Feet): 80 Feet Assistive device: Rolling walker (2 wheeled) Gait Pattern/deviations:  Step-through pattern;Decreased stride length;Step-to pattern;Scissoring;Ataxic Gait velocity: decr   General Gait Details: min assist for steadying, guiding pt and RW. Verbal cuing for close proximity to RW, but with verbal cuing pt immediately stopped using RW and walked without it, leading to very unsteady and scissoring-type gait.   Stairs             Wheelchair Mobility    Modified Rankin (Stroke Patients Only)       Balance Overall balance assessment: Needs assistance Sitting-balance support: No upper extremity supported;Feet supported Sitting balance-Leahy Scale: Good     Standing balance support: No upper extremity supported Standing balance-Leahy Scale: Poor Standing balance comment: reliant on external support, very unsteady and reaching for environment without it.                            Cognition Arousal/Alertness: Awake/alert Behavior During Therapy: Agitated;Impulsive Overall Cognitive Status: Impaired/Different from baseline Area of Impairment: Attention;Following commands;Safety/judgement;Problem solving                   Current Attention Level: Focused   Following Commands: Follows one step commands inconsistently;Follows one step commands with increased time Safety/Judgement: Decreased awareness of safety;Decreased awareness of deficits   Problem Solving: Slow processing;Decreased initiation;Difficulty sequencing;Requires verbal cues;Requires tactile cues General Comments: pt appears very irritated with PT, cursing multiple times at PT and quickly becomes irritated if PT does not understand him. Pt moves quickly and unsteadily, and left RW in the corner and pulled away from PT who was steadying him states "stop pinching my arm" (PT was not pinching his arm,  but supporting his trunk with light open lumbrical grip)      Exercises      General Comments General comments (skin integrity, edema, etc.): HRmax 130 bpm with ambulation,  SpO2 94% on RA post-ambulation with DOE 2-3/4      Pertinent Vitals/Pain Pain Assessment: Faces Faces Pain Scale: Hurts little more Pain Location: port site Pain Descriptors / Indicators: Discomfort;Grimacing Pain Intervention(s): Limited activity within patient's tolerance;Monitored during session;Repositioned    Home Living                      Prior Function            PT Goals (current goals can now be found in the care plan section) Acute Rehab PT Goals Patient Stated Goal: Regain IND PT Goal Formulation: With patient Time For Goal Achievement: 09/10/19 Potential to Achieve Goals: Good Progress towards PT goals: Progressing toward goals    Frequency    Min 3X/week      PT Plan Current plan remains appropriate    Co-evaluation              AM-PAC PT "6 Clicks" Mobility   Outcome Measure  Help needed turning from your back to your side while in a flat bed without using bedrails?: None Help needed moving from lying on your back to sitting on the side of a flat bed without using bedrails?: A Little Help needed moving to and from a bed to a chair (including a wheelchair)?: A Little Help needed standing up from a chair using your arms (e.g., wheelchair or bedside chair)?: A Little Help needed to walk in hospital room?: A Little Help needed climbing 3-5 steps with a railing? : A Lot 6 Click Score: 18    End of Session Equipment Utilized During Treatment: Other (comment)(did not use gait belt due to pt agitation, PT utilized guarding principles to steady pt as needed) Activity Tolerance: Patient tolerated treatment well Patient left: in bed;with call bell/phone within reach;with bed alarm set Nurse Communication: Mobility status PT Visit Diagnosis: Unsteadiness on feet (R26.81);Muscle weakness (generalized) (M62.81);Difficulty in walking, not elsewhere classified (R26.2)     Time: [id number] PT Time Calculation (min) (ACUTE ONLY): 21 min  Charges:   $Gait Training: 8-22 mins                    Loucinda Croy E, PT [place] Pager [phone]  Office [phone]    [place] 08/31/2019, 4:25 PM

## 2019-08-31 NOTE — Progress Notes (Signed)
Text paged MD to let him know patient asked RN to let him know he is refusing chemotherapy treatment at this time.  Patient states he desires to wait until he has regained some strength.   Patient had RN speak with sister/POA to communicate this information.  Sister states she wants to speak with Dr. Julien Nordmann.

## 2019-09-01 ENCOUNTER — Inpatient Hospital Stay (HOSPITAL_COMMUNITY): Payer: Medicaid Other

## 2019-09-01 LAB — CBC WITH DIFFERENTIAL/PLATELET
Abs Immature Granulocytes: 0.05 10*3/uL (ref 0.00–0.07)
Basophils Absolute: 0 10*3/uL (ref 0.0–0.1)
Basophils Relative: 0 %
Eosinophils Absolute: 0 10*3/uL (ref 0.0–0.5)
Eosinophils Relative: 0 %
HCT: 22.9 % — ABNORMAL LOW (ref 39.0–52.0)
Hemoglobin: 7.6 g/dL — ABNORMAL LOW (ref 13.0–17.0)
Immature Granulocytes: 2 %
Lymphocytes Relative: 42 %
Lymphs Abs: 1.3 10*3/uL (ref 0.7–4.0)
MCH: 29.1 pg (ref 26.0–34.0)
MCHC: 33.2 g/dL (ref 30.0–36.0)
MCV: 87.7 fL (ref 80.0–100.0)
Monocytes Absolute: 0.9 10*3/uL (ref 0.1–1.0)
Monocytes Relative: 28 %
Neutro Abs: 0.9 10*3/uL — ABNORMAL LOW (ref 1.7–7.7)
Neutrophils Relative %: 28 %
Platelets: 51 10*3/uL — ABNORMAL LOW (ref 150–400)
RBC: 2.61 MIL/uL — ABNORMAL LOW (ref 4.22–5.81)
RDW: 19.4 % — ABNORMAL HIGH (ref 11.5–15.5)
WBC: 3.1 10*3/uL — ABNORMAL LOW (ref 4.0–10.5)
nRBC: 0 % (ref 0.0–0.2)

## 2019-09-01 LAB — BLOOD GAS, ARTERIAL
Acid-Base Excess: 1.3 mmol/L (ref 0.0–2.0)
Bicarbonate: 25.5 mmol/L (ref 20.0–28.0)
O2 Saturation: 95.2 %
Patient temperature: 96.6
pCO2 arterial: 39.3 mmHg (ref 32.0–48.0)
pH, Arterial: 7.423 (ref 7.350–7.450)
pO2, Arterial: 65.1 mmHg — ABNORMAL LOW (ref 83.0–108.0)

## 2019-09-01 LAB — COMPREHENSIVE METABOLIC PANEL
ALT: 31 U/L (ref 0–44)
AST: 58 U/L — ABNORMAL HIGH (ref 15–41)
Albumin: 1.8 g/dL — ABNORMAL LOW (ref 3.5–5.0)
Alkaline Phosphatase: 213 U/L — ABNORMAL HIGH (ref 38–126)
Anion gap: 10 (ref 5–15)
BUN: 21 mg/dL — ABNORMAL HIGH (ref 6–20)
CO2: 25 mmol/L (ref 22–32)
Calcium: 9.6 mg/dL (ref 8.9–10.3)
Chloride: 94 mmol/L — ABNORMAL LOW (ref 98–111)
Creatinine, Ser: 0.56 mg/dL — ABNORMAL LOW (ref 0.61–1.24)
GFR calc Af Amer: >60 mL/min (ref >60)
GFR calc non Af Amer: >60 mL/min (ref >60)
Glucose, Bld: 84 mg/dL (ref 70–99)
Potassium: 4 mmol/L (ref 3.5–5.1)
Sodium: 129 mmol/L — ABNORMAL LOW (ref 135–145)
Total Bilirubin: 1 mg/dL (ref 0.3–1.2)
Total Protein: 4.2 g/dL — ABNORMAL LOW (ref 6.5–8.1)

## 2019-09-01 LAB — LACTATE DEHYDROGENASE: LDH: 525 U/L — ABNORMAL HIGH (ref 98–192)

## 2019-09-01 LAB — LACTIC ACID, PLASMA: Lactic Acid, Venous: 2.7 mmol/L (ref 0.5–1.9)

## 2019-09-01 LAB — URIC ACID: Uric Acid, Serum: 5.3 mg/dL (ref 3.7–8.6)

## 2019-09-01 MED ORDER — SODIUM CHLORIDE 0.9 % IV SOLN
Freq: Once | INTRAVENOUS | Status: AC
  Administered 2019-09-01: 12:00:00 via INTRAVENOUS
  Filled 2019-09-01: qty 5

## 2019-09-01 MED ORDER — ALBUMIN HUMAN 25 % IV SOLN
25.0000 g | Freq: Three times a day (TID) | INTRAVENOUS | Status: AC
  Administered 2019-09-01 – 2019-09-02 (×3): 25 g via INTRAVENOUS
  Filled 2019-09-01: qty 100
  Filled 2019-09-01: qty 50
  Filled 2019-09-01: qty 100
  Filled 2019-09-01: qty 50

## 2019-09-01 MED ORDER — SODIUM CHLORIDE 0.9 % IV SOLN: Freq: Once | INTRAVENOUS | Status: DC | PRN

## 2019-09-01 MED ORDER — MEPERIDINE HCL 25 MG/ML IJ SOLN
25.0000 mg | INTRAMUSCULAR | Status: DC | PRN
  Administered 2019-09-01: 25 mg via INTRAVENOUS
  Filled 2019-09-01 (×2): qty 1

## 2019-09-01 MED ORDER — HOT PACK MISC ONCOLOGY
1.0000 | Freq: Once | Status: AC | PRN
  Filled 2019-09-01: qty 1

## 2019-09-01 MED ORDER — ACETAMINOPHEN 325 MG PO TABS
650.0000 mg | ORAL_TABLET | Freq: Four times a day (QID) | ORAL | Status: AC
  Administered 2019-09-01: 650 mg via ORAL
  Filled 2019-09-01: qty 2

## 2019-09-01 MED ORDER — METHYLPREDNISOLONE SODIUM SUCC 125 MG IJ SOLR
125.0000 mg | Freq: Once | INTRAMUSCULAR | Status: AC | PRN
  Administered 2019-09-01: 125 mg via INTRAVENOUS
  Filled 2019-09-01: qty 2

## 2019-09-01 MED ORDER — DIPHENHYDRAMINE HCL 50 MG/ML IJ SOLN
50.0000 mg | Freq: Four times a day (QID) | INTRAMUSCULAR | Status: AC
  Administered 2019-09-01 (×2): 50 mg via INTRAVENOUS
  Filled 2019-09-01: qty 1

## 2019-09-01 MED ORDER — DIPHENHYDRAMINE HCL 50 MG/ML IJ SOLN
50.0000 mg | Freq: Once | INTRAMUSCULAR | Status: DC | PRN
  Filled 2019-09-01: qty 1

## 2019-09-01 MED ORDER — FAMOTIDINE IN NACL 20-0.9 MG/50ML-% IV SOLN
20.0000 mg | Freq: Once | INTRAVENOUS | Status: AC | PRN
  Administered 2019-09-01: 20 mg via INTRAVENOUS
  Filled 2019-09-01 (×2): qty 50

## 2019-09-01 MED ORDER — NOREPINEPHRINE 4 MG/250ML-% IV SOLN
0.0000 ug/min | INTRAVENOUS | Status: DC
  Administered 2019-09-01: 19:00:00 10 ug/min via INTRAVENOUS
  Filled 2019-09-01: qty 250

## 2019-09-01 MED ORDER — ALBUTEROL SULFATE (2.5 MG/3ML) 0.083% IN NEBU: 2.5000 mg | INHALATION_SOLUTION | Freq: Once | RESPIRATORY_TRACT | Status: DC | PRN

## 2019-09-01 MED ORDER — COLD PACK MISC ONCOLOGY
1.0000 | Freq: Once | Status: AC | PRN
  Filled 2019-09-01: qty 1

## 2019-09-01 MED ORDER — FAMOTIDINE IN NACL 20-0.9 MG/50ML-% IV SOLN
20.0000 mg | Freq: Once | INTRAVENOUS | Status: AC
  Administered 2019-09-01: 20 mg via INTRAVENOUS
  Filled 2019-09-01: qty 50

## 2019-09-01 MED ORDER — EPINEPHRINE 0.3 MG/0.3ML IJ SOAJ
0.3000 mg | Freq: Once | INTRAMUSCULAR | Status: DC | PRN
  Filled 2019-09-01: qty 0.6

## 2019-09-01 MED ORDER — SODIUM CHLORIDE 0.9 % IV SOLN: 375.0000 mg/m2 | Freq: Once | INTRAVENOUS | Status: DC

## 2019-09-01 MED ORDER — SODIUM CHLORIDE 0.9 % IV SOLN
375.0000 mg/m2 | Freq: Once | INTRAVENOUS | Status: AC
  Administered 2019-09-01: 700 mg via INTRAVENOUS
  Filled 2019-09-01: qty 50

## 2019-09-01 MED ORDER — SODIUM CHLORIDE 0.9 % IV SOLN
562.5000 mg/m2 | Freq: Once | INTRAVENOUS | Status: AC
  Administered 2019-09-01: 1020 mg via INTRAVENOUS
  Filled 2019-09-01: qty 51

## 2019-09-01 MED ORDER — DOXORUBICIN HCL CHEMO IV INJECTION 2 MG/ML
37.5000 mg/m2 | Freq: Once | INTRAVENOUS | Status: AC
  Administered 2019-09-01: 68 mg via INTRAVENOUS
  Filled 2019-09-01: qty 34

## 2019-09-01 MED ORDER — SODIUM CHLORIDE 0.9 % IV SOLN
Freq: Once | INTRAVENOUS | Status: AC
  Administered 2019-09-01: 12:00:00 via INTRAVENOUS

## 2019-09-01 MED ORDER — VINCRISTINE SULFATE CHEMO INJECTION 1 MG/ML
1.5000 mg | Freq: Once | INTRAVENOUS | Status: AC
  Administered 2019-09-01: 1.5 mg via INTRAVENOUS
  Filled 2019-09-01: qty 1.5

## 2019-09-01 MED ORDER — PALONOSETRON HCL INJECTION 0.25 MG/5ML
0.2500 mg | Freq: Once | INTRAVENOUS | Status: AC
  Administered 2019-09-01: 0.25 mg via INTRAVENOUS
  Filled 2019-09-01: qty 5

## 2019-09-01 NOTE — Progress Notes (Signed)
Paged Dr. Tawanna Solo as I was unable to reach Dr. Earlie Server.  Patient now hypotensive with most recent blood pressure being 85/43 and 87% on 3L nasal cannula.  Dr. Tawanna Solo to order a STAT chest xray and if negative will order fluids.

## 2019-09-01 NOTE — Progress Notes (Addendum)
MEWS Guidelines - (patients age 56 and over)  Red - At High Risk for Deterioration Yellow - At risk for Deterioration  1. Go to room and assess patient 2. Validate data. Is this patient's baseline? If data confirmed: 3. Is this an acute change? 4. Administer prn meds/treatments as ordered. 5. Note Sepsis score 6. Review goals of care 7. Sports coach, RRT nurse and Provider. 8. Ask Provider to come to bedside.  9. Document patient condition/interventions/response. 10. Increase frequency of vital signs and focused assessments to at least q15 minutes x 4, then q30 minutes x2. - If stable, then q1h x3, then q4h x3 and then q8h or dept. routine. - If unstable, contact Provider & RRT nurse. Prepare for possible transfer. 11. Add entry in progress notes using the smart phrase ".MEWS". 1. Go to room and assess patient 2. Validate data. Is this patient's baseline? If data confirmed: 3. Is this an acute change? 4. Administer prn meds/treatments as ordered? 5. Note Sepsis score 6. Review goals of care 7. Sports coach and Provider 8. Call RRT nurse as needed. 9. Document patient condition/interventions/response. 10. Increase frequency of vital signs and focused assessments to at least q2h x2. - If stable, then q4h x2 and then q8h or dept. routine. - If unstable, contact Provider & RRT nurse. Prepare for possible transfer. 11. Add entry in progress notes using the smart phrase ".MEWS".  Green - Likely stable Lavender - Comfort Care Only  1. Continue routine/ordered monitoring.  2. Review goals of care. 1. Continue routine/ordered monitoring. 2. Review goals of care.    Pt's VS improving, lethargic related to premedications given for chemotherapy. Will continue to monitor.

## 2019-09-01 NOTE — Progress Notes (Signed)
Pt transferred per MD order with all belongings. Pt's sister with pt at time of transfer. Report called to receiving RN.

## 2019-09-01 NOTE — Progress Notes (Signed)
Pt's sister, POA, would like to speak with Dr. Julien Nordmann prior to any treatments today. Message left with Dr. Worthy Flank office.

## 2019-09-01 NOTE — Progress Notes (Signed)
Paged Dr. Tawanna Solo regarding low pressures. BP on admission was 78/36, manual recheck 80/38. Pt is lethargic but will arouse to follow commands and is extremely diaphoretic. Levophed ordered. Will continue to monitor.

## 2019-09-01 NOTE — Progress Notes (Addendum)
HEMATOLOGY-ONCOLOGY PROGRESS NOTE  SUBJECTIVE: The patient is somewhat sleepy this morning.  Received Ativan earlier today.  He does awaken to talk to me today.  Reports a dry mouth.  He is not complaining of any chest discomfort or shortness of breath.  Still has abdominal distention.  Not reporting any nausea or vomiting this morning.  He continues to have intermittent fevers and tachycardia.  REVIEW OF SYSTEMS:   Constitutional: Continues to have intermittent fevers Respiratory: Denies cough, dyspnea or wheezes Cardiovascular: Denies palpitation, chest discomfort Gastrointestinal: Reports abdominal distention.  Denies nausea, heartburn or change in bowel habits Skin: Denies abnormal skin rashes Lymphatics: Reports ongoing lymphadenopathy in his neck and groin Neurological:Denies numbness, tingling or new weaknesses Behavioral/Psych: Mood is stable, no new changes  Extremities: Reports improved lower extremity edema All other systems were reviewed with the patient and are negative.  I have reviewed the past medical history, past surgical history, social history and family history with the patient and they are unchanged from previous note.   PHYSICAL EXAMINATION: ECOG PERFORMANCE STATUS: 2 - Symptomatic, <50% confined to bed  Vitals:   08/31/19 2102 09/01/19 0535  BP: (!) 104/55 116/60  Pulse: (!) 115 (!) 122  Resp: 20 19  Temp: 98.9 F (37.2 C) 98.2 F (36.8 C)  SpO2: 90% (!) 86%   Filed Weights   08/29/19 0435 08/30/19 0517 08/31/19 0649  Weight: 152 lb 5.4 oz (69.1 kg) 155 lb 8 oz (70.5 kg) 144 lb (65.3 kg)    Intake/Output from previous day: 12/01 0701 - 12/02 0700 In: 959.7 [P.O.:540; I.V.:419.7] Out: 850 [Urine:850]  GENERAL:alert, no distress and comfortable SKIN: Faint macular rash noted over abdomen EYES: normal, Conjunctiva are pink and non-injected, sclera clear OROPHARYNX:no exudate, no erythema and lips, buccal mucosa, and tongue normal  NECK: supple,  thyroid normal size, non-tender, without nodularity LYMPH: Palpable cervical, axillary, and inguinal lymphadenopathy LUNGS: clear to auscultation and percussion with normal breathing effort HEART: Tachycardic, no murmurs and trace bilateral lower extremity pitting edema ABDOMEN: Positive bowel sounds, distended, splenomegaly noted Musculoskeletal:no cyanosis of digits and no clubbing  NEURO: alert & oriented x 3 with fluent speech, no focal motor/sensory deficits  LABORATORY DATA:  I have reviewed the data as listed CMP Latest Ref Rng & Units 09/01/2019 08/30/2019 08/29/2019  Glucose 70 - 99 mg/dL 84 113(H) 85  BUN 6 - 20 mg/dL 21(H) 23(H) 21(H)  Creatinine 0.61 - 1.24 mg/dL 0.56(L) 0.69 0.66  Sodium 135 - 145 mmol/L 129(L) 133(L) 130(L)  Potassium 3.5 - 5.1 mmol/L 4.0 4.1 3.8  Chloride 98 - 111 mmol/L 94(L) 99 95(L)  CO2 22 - 32 mmol/L _0 Calcium 8.9 - 10.3 mg/dL 9.6 10.4(H) 9.9  Total Protein 6.5 - 8.1 g/dL 4.2(L) 4.2(L) 4.2(L)  Total Bilirubin 0.3 - 1.2 mg/dL 1.0 0.9 1.0  Alkaline Phos 38 - 126 U/L 213(H) 282(H) 259(H)  AST 15 - 41 U/L 58(H) 63(H) 72(H)  ALT 0 - 44 U/L 31 37 41    Lab Results  Component Value Date   WBC 3.1 (L) 09/01/2019   HGB 7.6 (L) 09/01/2019   HCT 22.9 (L) 09/01/2019   MCV 87.7 09/01/2019   PLT 51 (L) 09/01/2019   NEUTROABS 0.9 (L) 09/01/2019    Dg Chest 1 View  Result Date: 08/22/2019 CLINICAL DATA:  Status post thoracentesis. EXAM: CHEST  1 VIEW COMPARISON:  Earlier today at 0400 hours. FINDINGS: 1550 hours. Patient rotated left. Midline trachea. Normal heart size. Interval resolution  of left-sided pleural fluid. No right-sided effusion. No pneumothorax. Skin fold projects over the left lower chest. Significantly improved left base airspace disease. Right infrahilar atelectasis remains. Again identified is a right midlung 7 mm nodule IMPRESSION: Interval left thoracentesis, without pneumothorax. Resolution of left lower lobe airspace disease,  with mild right infrahilar atelectasis remaining. Suspicion of a 7 mm right midlung nodule, as before. Electronically Signed   By: Abigail Miyamoto M.D.   On: 08/22/2019 16:17   Dg Chest 2 View  Result Date: 08/17/2019 CLINICAL DATA:  Shortness of breath EXAM: CHEST - 2 VIEW COMPARISON:  None. FINDINGS: Small bilateral pleural effusions. Mild basilar airspace disease. Borderline heart size with mild central vascular congestion. No pneumothorax. IMPRESSION: Mild central vascular congestion with small bilateral effusions. Atelectasis or mild pneumonia at the left greater than right lung base. Electronically Signed   By: Donavan Foil M.D.   On: 08/17/2019 19:57   Dg Abd 1 View  Result Date: 08/21/2019 CLINICAL DATA:  Abdominal swelling and discomfort. EXAM: ABDOMEN - 1 VIEW COMPARISON:  CT, 08/18/2019. FINDINGS: No bowel dilation to suggest obstruction. Bowel is displaced inferiorly by the markedly enlarged spleen. Soft tissues are poorly defined. No evidence of a renal or ureteral stone. There are scattered aortic and iliac artery atherosclerotic calcifications. IMPRESSION: 1. No bowel obstruction. 2. Enlarged spleen. Poorly defined soft tissues consistent with ascites as noted on the prior CT. Electronically Signed   By: Lajean Manes M.D.   On: 08/21/2019 14:20   Ct Angio Chest Pe W Or Wo Contrast  Result Date: 08/25/2019 CLINICAL DATA:  Tachycardia. 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 EXAM: CT ANGIOGRAPHY CHEST WITH CONTRAST TECHNIQUE: Multidetector CT imaging of the chest was performed using the standard protocol during bolus administration of intravenous contrast. Multiplanar CT image reconstructions and MIPs were obtained to evaluate the vascular anatomy. CONTRAST:  174m OMNIPAQUE IOHEXOL 350 MG/ML SOLN COMPARISON:  Chest radiograph dated 08/23/2019. CTA chest dated 08/17/2019. FINDINGS: Cardiovascular: Evaluation is constrained by respiratory motion, particularly in the bilateral lower lobes. Within  that constraint, there is no evidence of pulmonary embolism to the segmental level. No evidence of thoracic aortic aneurysm or dissection. The heart is normal in size. Moderate pericardial soft tissue/metastases inferiorly (series 4/image 73), unchanged, likely related to the patient's known lymphoproliferative disorder. Mediastinum/Nodes: Bulky thoracic lymphadenopathy, related to the patient's known lymphoproliferative disorder, including: --2.3 cm short axis high right paratracheal node (series 4/image 28) --3.0 cm short axis AP window node (series 4/image 35) --1.4 cm short axis right hilar node (series 4/image 42) --2.6 cm short axis subcarinal node (series 4/image 43) --2.1 cm short axis intralobar/right lower lobe node (series 4/image 55) Visualized thyroid is unremarkable. Lungs/Pleura: Small bilateral pulmonary nodules, most of which are pleural/subpleural, unchanged from recent CT, including a dominant 9 mm nodule in the posterior right lower lobe (series 6/image 36). Dominant central pulmonary nodule measures 8 mm in the left lower lobe (series 6/image 78). These findings are likely related to the patient's known lymphoma. Moderate left pleural effusion, stable versus mildly increased, partially loculated. Trace right pleural effusion, mildly decreased. Associated bilateral lower lobe atelectasis. No pneumothorax. Upper Abdomen: Visualized upper abdomen is notable for a small hiatal hernia, masses splenomegaly, and a splenic infarct, better evaluated on recent CT abdomen/pelvis. Musculoskeletal: Degenerative changes of the visualized thoracolumbar spine. Review of the MIP images confirms the above findings. IMPRESSION: No evidence of pulmonary embolism. Bulky thoracic lymphadenopathy, pericardial thickening/soft tissue, and bilateral pulmonary nodules, likely related to  the patient's known B-cell lymphoma, grossly unchanged. Moderate left pleural effusion, stable versus mildly increased, partially  loculated. Trace right pleural effusion, mildly decreased. Associated bilateral lower lobe atelectasis. Overall, there is no significant interval change from recent CTs. Electronically Signed   By: Julian Hy M.D.   On: 08/25/2019 22:00   Ct Angio Chest Pe W/cm &/or Wo Cm  Result Date: 08/17/2019 CLINICAL DATA:  Shortness of breath for 1 month EXAM: CT ANGIOGRAPHY CHEST WITH CONTRAST TECHNIQUE: Multidetector CT imaging of the chest was performed using the standard protocol during bolus administration of intravenous contrast. Multiplanar CT image reconstructions and MIPs were obtained to evaluate the vascular anatomy. CONTRAST:  145m OMNIPAQUE IOHEXOL 350 MG/ML SOLN COMPARISON:  Chest x-ray 08/17/2019 FINDINGS: Cardiovascular: Suboptimal contrast bolus timing and respiratory motion artifact degraded examination of the more distal pulmonary arterial tree. No filling defect within the main or lobar branch pulmonary arteries. No evidence of right heart strain. Heart size is normal. There is pericardial thickening. Thoracic aorta is normal in course and caliber. Mediastinum/Nodes: Bulky mediastinal, bilateral hilar, and bilateral axillary lymphadenopathy. Thyroid, trachea, and esophagus are grossly unremarkable. Lungs/Pleura: Small bilateral pleural effusions, left greater than right with associated compressive atelectasis. There are numerous bilateral noncalcified pulmonary nodules. Reference nodules include 10 mm right lower lobe juxtapleural nodule (series 6, image 88). 9 mm medial right lower lobe nodule (series 6, image 106). 7 mm anterior right middle lobe nodule (series 6, image 107). 7 mm left lower lobe nodule (series 6, image 90). 8 mm lingular nodule (series 6, image 92). No pneumothorax. Upper Abdomen: Hepatosplenomegaly within the visualized upper abdomen. Ill-defined area of low density within the peripheral aspect of the spleen measuring approximately 3.7 x 2.8 cm (series 4, image 135).  Musculoskeletal: No chest wall abnormality. No acute or significant osseous findings. Review of the MIP images confirms the above findings. IMPRESSION: 1. Limited exam. No filling defect to the lobar branch level to suggest pulmonary embolism. 2. Bulky mediastinal, bilateral hilar, and bilateral axillary lymphadenopathy. Findings concerning for primary lymphoproliferative process such as lymphoma. Metastatic disease secondary to unknown primary is also a consideration. 3. Multiple bilateral noncalcified pulmonary nodules measuring up to 10 mm, likely a result of the same process as listed above. 4. Small bilateral pleural effusions, left greater than right with associated compressive atelectasis. 5. Hepatosplenomegaly within the visualized upper abdomen with ill-defined area of low density within the peripheral aspect of the spleen measuring 3.7 x 2.8 cm. Findings are concerning for lymphomatous involvement versus metastatic disease. Alternatively, sequela of splenic injury could have a similar appearance. These results were called by telephone at the time of interpretation on 08/17/2019 at 8:49 pm to provider MARGAUX VENTER , who verbally acknowledged these results. Electronically Signed   By: NDavina PokeM.D.   On: 08/17/2019 20:51   [place]domen Complete  Result Date: 08/21/2019 CLINICAL DATA:  Abdominal pain and distention. History of alcohol and drug abuse. EXAM: ABDOMEN ULTRASOUND COMPLETE COMPARISON:  None. FINDINGS: Gallbladder: Small gallstones and sludge. At least 1 polyp noted measuring 3 mm. There is pericholecystic fluid. No sonographic Murphy's sign. Common bile duct: Diameter: 4 mm Liver: Liver appears mildly enlarged. Increased liver parenchymal echogenicity. No mass or focal lesion. Portal vein is patent on color Doppler imaging with normal direction of blood flow towards the liver. IVC: No abnormality visualized. Pancreas: Hypoechoic lesion either adjacent to or within the pancreatic head  measuring 2.3 x 1.4 cm. This could reflect a pancreatic mass or  adjacent enlarged gastrohepatic ligament lymph node a less well-defined hypoechoic area is seen in the pancreas below this. No pancreatic duct dilation. Spleen: Enlarged spleen measuring 24 x 9 x 23 cm. No splenic mass or focal lesion. Right Kidney: Length: 12.1 cm. Normal parenchymal echogenicity. There are hypo to anechoic cortical lesions that are consistent with cysts. Mild hydronephrosis. No stones. Left Kidney: Length: 12.5 cm. Echogenicity within normal limits. No mass or hydronephrosis visualized. Abdominal aorta: No aneurysm visualized. Other findings: Bilateral pleural effusions. IMPRESSION: 1. No convincing acute finding. There are small gallstones, gallbladder sludge and 1 small polyp as well as pericholecystic fluid, but no sonographic Murphy's sign. Gallbladder is also only mildly to moderately distended. No convincing acute cholecystitis. 2. Hepatosplenomegaly. Increased echogenicity of the liver consistent with hepatic steatosis. No liver mass. 3. Bilateral pleural effusions. 4. Mild right hydronephrosis. Hypoechoic to anechoic right renal masses that are likely cysts but not fully characterized. Electronically Signed   By: Lajean Manes M.D.   On: 08/21/2019 14:28   Ct Abdomen Pelvis W Contrast  Result Date: 08/18/2019 CLINICAL DATA:  58 year old with pancytopenia and chest lymphadenopathy. Concern for lymphoproliferative disorder. EXAM: CT ABDOMEN AND PELVIS WITH CONTRAST TECHNIQUE: Multidetector CT imaging of the abdomen and pelvis was performed using the standard protocol following bolus administration of intravenous contrast. CONTRAST:  140m OMNIPAQUE IOHEXOL 300 MG/ML  SOLN COMPARISON:  Chest CT 08/17/2019 FINDINGS: Lower chest: Small bilateral pleural effusions. Partial visualization of the right infrahilar lymphadenopathy. There may be markedly enlarged lymph nodes adjacent to the distal esophagus. Small amount of  pericardial fluid. Again noted is interstitial thickening in the right lower lobe with posterior consolidation in the right lower lobe. Again noted is a elongated nodular structure in the right lower lobe measuring 9 mm on sequence 6, image 20. Pleural-based nodules in the right lower lobe and right middle lobe are similar to the recent comparison examination. Again noted is a pleural-based nodule in the lingula. Focal pleural thickening along the base of the left major fissure is asimilar to the previous examination. Hepatobiliary: Normal appearance of the liver. No discrete liver lesions identified. Main portal venous system is patent. Gallbladder is decompressed. No significant biliary dilatation. Pancreas: Unremarkable. No pancreatic ductal dilatation or surrounding inflammatory changes. Spleen: Spleen is massive for size measuring 27.0 x 10.6 x 17.4 cm, splenic volume is 2489 mL. Scattered areas of non enhancement throughout the periphery of the spleen probably represent infarcts associated with the large size of the spleen. Indeterminate lesion along the left posterior aspect of spleen on sequence 2, image 44 that measures up to 3.8 cm. Small amount of fluid posterior to the spleen near the left hemidiaphragm. Adrenals/Urinary Tract: Adrenal glands are poorly characterized on this examination but no gross abnormality. Compression on the left kidney due to the splenomegaly. Negative for hydronephrosis. Question fullness of the right renal pelvis but this area is poorly characterized. Mild distention of the urinary bladder. High-density material in the urinary bladder could be related to recent chest CT. Stomach/Bowel: Cannot exclude a hiatal hernia. Stomach is compressed by lymph nodes and the splenomegaly. No evidence for acute bowel inflammation or bowel obstruction. Vascular/Lymphatic: Diffuse atherosclerotic calcifications in the abdominal aorta without aneurysm. Ectasias involving the celiac trunk,  measuring roughly 1.1 cm. There is probably stenosis involving the proximal SMA but poorly characterized on this examination. Venous structures are unremarkable. Evidence for enlarged lymph nodes in the gastrohepatic ligament region. Enlarged lymph nodes in the periaortic region. Enlarged lymph nodes  along the iliac nodal chains. Markedly enlarged lymph nodes in the right groin. There is a index lymph node in the right groin that measures 2.8 x 2.1 cm on sequence 2, image 78. Multiple small lymph nodes in both inguinal regions. Reproductive: Prostate is unremarkable. Other: Small amount of free fluid in the pelvis. There appears to be free fluid in the left lower quadrant below the spleen. Negative for free air. Diffuse subcutaneous edema. Dependent fluid in the paraspinal tissues in the lumbar spine. Musculoskeletal: Disc space narrowing at L5-S1. No suspicious bone lesions. IMPRESSION: 1. Massive splenomegaly with abdominal and inguinal lymphadenopathy. Findings are suggestive for a lymphoproliferative process such as lymphoma. 2. Evidence for multiple splenic infarcts likely related to the splenomegaly. Cannot exclude a splenic lesion along the left posterior aspect measuring up to 3.8 cm. 3. Evidence for fluid overload state with diffuse subcutaneous edema, bilateral pleural effusions and ascites. 4. Re-demonstration pleural-based nodularity at the lung bases. There is also septal thickening and a nodular structure in the right lower lobe. Findings are compatible with a neoplastic or lymphoproliferative process. 5. No discrete liver lesions. 6. Aortic Atherosclerosis (ICD10-I70.0). Concern for at least mild stenosis involving the proximal SMA. Ectasia of the celiac trunk. Electronically Signed   By: Markus Daft M.D.   On: 08/18/2019 12:59   Ct Biopsy  Result Date: 08/19/2019 INDICATION: 58 year old male with pancytopenia. He presents for bone marrow biopsy as an inpatient. EXAM: CT GUIDED BONE MARROW  ASPIRATION AND CORE BIOPSY Interventional Radiologist:  Criselda Peaches, MD MEDICATIONS: None. ANESTHESIA/SEDATION: 2 mg Versed were administered for anxiolysis. This does not constitute moderate sedation. FLUOROSCOPY TIME:  None. COMPLICATIONS: None immediate. Estimated blood loss: <25 mL PROCEDURE: Informed written consent was obtained from the patient after a thorough discussion of the procedural risks, benefits and alternatives. All questions were addressed. Maximal Sterile Barrier Technique was utilized including caps, mask, sterile gowns, sterile gloves, sterile drape, hand hygiene and skin antiseptic. A timeout was performed prior to the initiation of the procedure. The patient was positioned prone and non-contrast localization CT was performed of the pelvis to demonstrate the iliac marrow spaces. Maximal barrier sterile technique utilized including caps, mask, sterile gowns, sterile gloves, large sterile drape, hand hygiene, and betadine prep. Under sterile conditions and local anesthesia, an 11 gauge coaxial bone biopsy needle was advanced into the right iliac marrow space. Needle position was confirmed with CT imaging. Initially, bone marrow aspiration was performed. Next, the 11 gauge outer cannula was utilized to obtain a right iliac bone marrow core biopsy. Needle was removed. Hemostasis was obtained with compression. The patient tolerated the procedure well. Samples were prepared with the cytotechnologist. IMPRESSION: Technically successful CT-guided bone marrow aspiration and biopsy of the right iliac bone. Electronically Signed   By: Jacqulynn Cadet M.D.   On: 08/19/2019 10:38   Dg Chest Port 1 View  Result Date: 08/23/2019 CLINICAL DATA:  Shortness of breath EXAM: PORTABLE CHEST 1 VIEW COMPARISON:  Radiograph 08/22/2019 FINDINGS: Mild left anterior obliquity. Redemonstration of a 9 mm nodule in the right mid lung, seen on comparison CT. Stable bandlike areas of atelectasis and/or  scarring. Additional hazy basilar atelectatic changes are present. No significant reaccumulation of the left effusion. No right effusion. Cardiomediastinal contours are stable. No acute osseous or soft tissue abnormality. IMPRESSION: 1. Unchanged 9 mm nodule in the right mid lung, seen on comparison CT. 2. No significant reaccumulation of the left effusion post thoracentesis. 3. Stable bandlike areas of atelectasis and/or  scarring. Electronically Signed   By: Lovena Le M.D.   On: 08/23/2019 06:00   Dg Chest Port 1 View  Result Date: 08/22/2019 CLINICAL DATA:  Shortness of breath. EXAM: PORTABLE CHEST 1 VIEW COMPARISON:  08/21/2019 FINDINGS: 0400 hours. Leftward patient rotation. The cardio pericardial silhouette is enlarged. Interval progression of retrocardiac left base collapse/consolidation with persistent small left effusion. Infrahilar right base atelectasis or infiltrate noted. Nodular density noted at the left base may be a nipple shadow. IMPRESSION: Rotated film with retrocardiac left base collapse/consolidation and effusion. Small nodular density at the right base superimposed on the right fifth rib. Attention on follow-up recommended. Electronically Signed   By: Misty Stanley M.D.   On: 08/22/2019 07:03   Dg Chest Port 1 View  Result Date: 08/21/2019 CLINICAL DATA:  Shortness of breath. EXAM: PORTABLE CHEST 1 VIEW COMPARISON:  08/20/2019 FINDINGS: 0426 hours. Cardiopericardial silhouette is at upper limits of normal for size. Small bilateral pleural effusions are again noted with bibasilar collapse/consolidation. The visualized bony structures of the thorax are intact. Telemetry leads overlie the chest. IMPRESSION: Stable exam. Small bilateral pleural effusions with bibasilar collapse/consolidation. Electronically Signed   By: Misty Stanley M.D.   On: 08/21/2019 07:21   Dg Chest Port 1 View  Result Date: 08/20/2019 CLINICAL DATA:  Shortness of breath EXAM: PORTABLE CHEST 1 VIEW  COMPARISON:  Two days ago FINDINGS: Normal heart size. Haziness of the bilateral lower chest from layering pleural fluid with atelectasis by recent CT. No air bronchogram. No pneumothorax. IMPRESSION: Pleural effusions and atelectasis that have increased from 2 days ago. Electronically Signed   By: Monte Fantasia M.D.   On: 08/20/2019 07:33   Dg Chest Port 1 View  Result Date: 08/18/2019 CLINICAL DATA:  Shortness of breath EXAM: PORTABLE CHEST 1 VIEW COMPARISON:  08/18/2019 FINDINGS: Heart is normal size. Bilateral perihilar and lower lobe opacities. No visible effusions. No acute bony abnormality. IMPRESSION: Perihilar and lower lobe opacities could reflect atelectasis, edema or infection. Electronically Signed   By: Rolm Baptise M.D.   On: 08/18/2019 21:19   Dg Chest Port 1 View  Result Date: 08/18/2019 CLINICAL DATA:  Dyspnea. EXAM: PORTABLE CHEST 1 VIEW COMPARISON:  August 17, 2019. FINDINGS: Stable cardiomediastinal silhouette. No pneumothorax is noted. Mild bibasilar atelectasis or edema is noted with probable small pleural effusions. Bony thorax is unremarkable. IMPRESSION: Mild bibasilar atelectasis or edema is noted with probable small pleural effusions. Electronically Signed   By: Marijo Conception M.D.   On: 08/18/2019 08:27   Ct Bone Marrow Biopsy & Aspiration  Result Date: 08/19/2019 INDICATION: 58 year old male with pancytopenia. He presents for bone marrow biopsy as an inpatient. EXAM: CT GUIDED BONE MARROW ASPIRATION AND CORE BIOPSY Interventional Radiologist:  Criselda Peaches, MD MEDICATIONS: None. ANESTHESIA/SEDATION: 2 mg Versed were administered for anxiolysis. This does not constitute moderate sedation. FLUOROSCOPY TIME:  None. COMPLICATIONS: None immediate. Estimated blood loss: <25 mL PROCEDURE: Informed written consent was obtained from the patient after a thorough discussion of the procedural risks, benefits and alternatives. All questions were addressed. Maximal Sterile  Barrier Technique was utilized including caps, mask, sterile gowns, sterile gloves, sterile drape, hand hygiene and skin antiseptic. A timeout was performed prior to the initiation of the procedure. The patient was positioned prone and non-contrast localization CT was performed of the pelvis to demonstrate the iliac marrow spaces. Maximal barrier sterile technique utilized including caps, mask, sterile gowns, sterile gloves, large sterile drape, hand hygiene, and betadine prep.  Under sterile conditions and local anesthesia, an 11 gauge coaxial bone biopsy needle was advanced into the right iliac marrow space. Needle position was confirmed with CT imaging. Initially, bone marrow aspiration was performed. Next, the 11 gauge outer cannula was utilized to obtain a right iliac bone marrow core biopsy. Needle was removed. Hemostasis was obtained with compression. The patient tolerated the procedure well. Samples were prepared with the cytotechnologist. IMPRESSION: Technically successful CT-guided bone marrow aspiration and biopsy of the right iliac bone. Electronically Signed   By: Jacqulynn Cadet M.D.   On: 08/19/2019 10:38   [place] Core Biopsy (lymph Nodes)  Result Date: 08/18/2019 INDICATION: No known primary, now with extensive lymphadenopathy and splenomegaly worrisome for lymphoma. Please perform ultrasound-guided right inguinal lymph node biopsy for tissue diagnostic purposes. EXAM: ULTRASOUND-GUIDED RIGHT INGUINAL LYMPH NODE BIOPSY COMPARISON:  Chest CT-08/17/2019; CT abdomen and pelvis-earlier same day MEDICATIONS: None ANESTHESIA/SEDATION: None COMPLICATIONS: None immediate. TECHNIQUE: Informed written consent was obtained from the patient after a discussion of the risks, benefits and alternatives to treatment. Questions regarding the procedure were encouraged and answered. Initial ultrasound scanning demonstrated 2 adjacent pathologically enlarged right inguinal lymph nodes compatible with the findings seen  on preceding abdominal CT. The dominant approximately 2.7 x 1.6 cm right inguinal lymph node was targeted for biopsy given location and sonographic window (image 7). An ultrasound image was saved for documentation purposes. The procedure was planned. A timeout was performed prior to the initiation of the procedure. The operative was prepped and draped in the usual sterile fashion, and a sterile drape was applied covering the operative field. A timeout was performed prior to the initiation of the procedure. Local anesthesia was provided with 1% lidocaine with epinephrine. Under direct ultrasound guidance, an 18 gauge core needle device was utilized to obtain to obtain 6 core needle biopsies of the dominant right inguinal lymph node. The samples were placed in saline and submitted to pathology. The needle was removed and hemostasis was achieved with manual compression. Post procedure scan was negative for significant hematoma. A dressing was placed. The patient tolerated the procedure well without immediate postprocedural complication. IMPRESSION: Technically successful ultrasound guided core needle biopsy of dominant right inguinal lymph node. Electronically Signed   By: Sandi Mariscal M.D.   On: 08/18/2019 13:39   [facility] Guided Port Insertion  Result Date: 08/30/2019 INDICATION: Recent diagnosis of B cell lymphoma. In need of durable intravenous access for chemotherapy administration. EXAM: IMPLANTED PORT A CATH PLACEMENT WITH ULTRASOUND AND FLUOROSCOPIC GUIDANCE COMPARISON:  Chest CT - 08/25/2019 MEDICATIONS: Ancef 2 gm IV; The antibiotic was administered within an appropriate time interval prior to skin puncture. ANESTHESIA/SEDATION: Moderate (conscious) sedation was employed during this procedure. A total of Versed 2 mg and Fentanyl 100 mcg was administered intravenously. Moderate Sedation Time: 23 minutes. The patient's level of consciousness and vital signs were monitored continuously by radiology nursing  throughout the procedure under my direct supervision. CONTRAST:  None FLUOROSCOPY TIME:  18 seconds (4.6 mGy) COMPLICATIONS: None immediate. PROCEDURE: The procedure, risks, benefits, and alternatives were explained to the patient. Questions regarding the procedure were encouraged and answered. The patient understands and consents to the procedure. The right neck and chest were prepped with chlorhexidine in a sterile fashion, and a sterile drape was applied covering the operative field. Maximum barrier sterile technique with sterile gowns and gloves were used for the procedure. A timeout was performed prior to the initiation of the procedure. Local anesthesia was provided with 1% lidocaine  with epinephrine. After creating a small venotomy incision, a micropuncture kit was utilized to access the internal jugular vein. Real-time ultrasound guidance was utilized for vascular access including the acquisition of a permanent ultrasound image documenting patency of the accessed vessel. The microwire was utilized to measure appropriate catheter length. A subcutaneous port pocket was then created along the upper chest wall utilizing a combination of sharp and blunt dissection. The pocket was irrigated with sterile saline. A single lumen ISP power injectable port was chosen for placement. The 8 Fr catheter was tunneled from the port pocket site to the venotomy incision. The port was placed in the pocket. The external catheter was trimmed to appropriate length. At the venotomy, an 8 Fr peel-away sheath was placed over a guidewire under fluoroscopic guidance. The catheter was then placed through the sheath and the sheath was removed. Final catheter positioning was confirmed and documented with a fluoroscopic spot radiograph. The port was accessed with a Huber needle, aspirated and flushed with heparinized saline. The venotomy site was closed with an interrupted 4-0 Vicryl suture. The port pocket incision was closed with  interrupted 2-0 Vicryl suture and the skin was opposed with a running subcuticular 4-0 Vicryl suture. Dermabond and Steri-strips were applied to both incisions. Dressings were placed. The patient tolerated the procedure well without immediate post procedural complication. FINDINGS: After catheter placement, the tip lies within the superior cavoatrial junction. The catheter aspirates and flushes normally and is ready for immediate use. IMPRESSION: Successful placement of a right internal jugular approach power injectable Port-A-Cath. The catheter is ready for immediate use. Electronically Signed   By: Sandi Mariscal M.D.   On: 08/30/2019 16:58   Vas [place] Lower Extremity Venous (dvt)  Result Date: 08/18/2019  Lower Venous Study Indications: Elevated d dimer.  Comparison Study: no prior Performing Technologist: Abram Sander RVS  Examination Guidelines: A complete evaluation includes B-mode imaging, spectral Doppler, color Doppler, and power Doppler as needed of all accessible portions of each vessel. Bilateral testing is considered an integral part of a complete examination. Limited examinations for reoccurring indications may be performed as noted.  +---------+---------------+---------+-----------+----------+--------------+ RIGHT    CompressibilityPhasicitySpontaneityPropertiesThrombus Aging +---------+---------------+---------+-----------+----------+--------------+ CFV      Full           Yes      Yes                                 +---------+---------------+---------+-----------+----------+--------------+ SFJ      Full                                                        +---------+---------------+---------+-----------+----------+--------------+ FV Prox  Full                                                        +---------+---------------+---------+-----------+----------+--------------+ FV Mid   Full                                                         +---------+---------------+---------+-----------+----------+--------------+  FV DistalFull                                                        +---------+---------------+---------+-----------+----------+--------------+ PFV      Full                                                        +---------+---------------+---------+-----------+----------+--------------+ POP      Full           Yes      Yes                                 +---------+---------------+---------+-----------+----------+--------------+ PTV      Full                                                        +---------+---------------+---------+-----------+----------+--------------+ PERO     Full                                                        +---------+---------------+---------+-----------+----------+--------------+   +---------+---------------+---------+-----------+----------+--------------+ LEFT     CompressibilityPhasicitySpontaneityPropertiesThrombus Aging +---------+---------------+---------+-----------+----------+--------------+ CFV      Full           Yes      Yes                                 +---------+---------------+---------+-----------+----------+--------------+ SFJ      Full                                                        +---------+---------------+---------+-----------+----------+--------------+ FV Prox  Full                                                        +---------+---------------+---------+-----------+----------+--------------+ FV Mid   Full                                                        +---------+---------------+---------+-----------+----------+--------------+ FV DistalFull                                                        +---------+---------------+---------+-----------+----------+--------------+   PFV      Full                                                         +---------+---------------+---------+-----------+----------+--------------+ POP      Full           Yes      Yes                                 +---------+---------------+---------+-----------+----------+--------------+ PTV      Full                                                        +---------+---------------+---------+-----------+----------+--------------+ PERO     Full                                                        +---------+---------------+---------+-----------+----------+--------------+     Summary: Right: There is no evidence of deep vein thrombosis in the lower extremity. No cystic structure found in the popliteal fossa. Left: There is no evidence of deep vein thrombosis in the lower extremity. No cystic structure found in the popliteal fossa.  *See table(s) above for measurements and observations. Electronically signed by Harold Barban MD on 08/18/2019 at 1:53:01 PM.    Final    US Thoracentesis Asp Pleural Space W/img Guide  Result Date: 08/24/2019 INDICATION: Patient with shortness of breath, left pleural effusion. Request made for diagnostic and therapeutic left thoracentesis. EXAM: ULTRASOUND GUIDED DIAGNOSTIC AND THERAPEUTIC LEFT THORACENTESIS MEDICATIONS: 10 mL 1% lidocaine COMPLICATIONS: None immediate. PROCEDURE: An ultrasound guided thoracentesis was thoroughly discussed with the patient and questions answered. The benefits, risks, alternatives and complications were also discussed. The patient understands and wishes to proceed with the procedure. Written consent was obtained. Ultrasound was performed to localize and mark an adequate pocket of fluid in the left chest. The area was then prepped and draped in the normal sterile fashion. 1% Lidocaine was used for local anesthesia. Under ultrasound guidance a 6 Fr Safe-T-Centesis catheter was introduced. Thoracentesis was performed. The catheter was removed and a dressing applied. FINDINGS: A total of  approximately 550 mL of yellow fluid was removed. Samples were sent to the laboratory as requested by the clinical team. IMPRESSION: Successful ultrasound guided left thoracentesis yielding 550 mL of pleural fluid. Read by: Brynda Greathouse PA-C Electronically Signed   By: Jerilynn Mages.  Shick M.D.   On: 08/22/2019 16:59    ASSESSMENT AND PLAN: 1.    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  The patient presented with bulky lymphadenopathy. 2.  Pancytopenia 3.  Lactic acidosis/sepsis, resolved 4.  Dyspnea secondary to left greater than right pleural effusions, resolved 5.  Hyponatremia, improved 6.  Anxiety 7.  Chronic back pain 8.  Tobacco dependence 9.  Hypercalcemia  -Right inguinal lymph node biopsy consistent with non-Hodgkin B-cell lymphoma.  Bone marrow biopsy most consistent with chronic lymphocytic leukemia/small lymphocytic lymphoma-fish (B-cell panel)  is pending, and pleural fluid showed atypical lymphocytes suspicious for lymphoproliferative process.  He received a partial dose of rituximab with an infusion reaction.  He was not rechallenged.  Unfortunately, he continues to have persistent intermittent fevers likely related to his underlying lymphoma.  Due to his fevers, he is not stable for discharge.  I again discussed with the patient today proceeding with chemotherapy as an inpatient.  Chemotherapy would consist of R-CHOP.  Adverse effects of the treatment had a discussed with the patient including but not limited to alopecia, myelosuppression, nausea and vomiting, cardiotoxicity, nephrotoxicity, and hepatotoxicity.  Additionally, rituximab can cause an infusion reaction.  He did have a reaction with his first dose and will plan to heavily premedicate him prior to rituximab. Echocardiogram was obtained on 08/18/2019 and showed LVEF of 60-65%.  We will plan for chemotherapy administration today, 09/01/2019.  Okay to proceed with chemotherapy despite blood counts.  We will plan to begin  Granix starting tomorrow. -The patient is at high risk for tumor lysis syndrome due to bulky lymphadenopathy. Continue Allopurinol 300 mg daily.  Continue normal saline at 50 cc/h.  We will recheck uric acid in the morning.  It is normal today. -The patient presented with pancytopenia likely due to bone marrow involvement of his lymphoma.  Hemoglobin 7.6 today recommend PRBC transfusion for hemoglobin less than 8.  Recommend platelet transfusion if platelets drop below 20,000 or active bleeding.    Repeat CBC with differential in the morning. -He was counseled about smoking cessation. -Calcium level improved with IV fluids.  We will recheck CMET in the morning.  I contacted the patient's sister, [name], by telephone.  We discussed his continued clinical decline and that some of his symptoms are likely related to his underlying lymphoma.  We were hoping that the patient would get strong enough to be discharged to home and would plan for outpatient chemotherapy, however, this has not happened.  We will need to proceed with chemotherapy as an inpatient to try to improve his symptoms.  We discussed his prior reaction to rituximab and the plan for stronger premedications today. We will watch very closely for an infusion reaction and manage if this occurs.  We will continue supportive care to manage any side effects of the chemotherapy.  I answered her questions to the best of my ability.  We will keep her updated on the patient's status.   LOS: 15 days   Mikey Bussing, DNP, AGPCNP-BC, AOCNP 09/01/19   ADDENDUM: Hematology/Oncology Attending: I had a face-to-face encounter with the patient today.  I recommended his care plan.  I agree with the above note.  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  The patient started the first cycle of systemic  chemotherapy with reduced dose CHOP and he is currently receiving Rituxan.  He developed hypersensitivity reaction to the Rituxan again and he is currently on oxygen supplement in addition to Solu-Medrol and Benadryl.  When the patient improve we will try to resume the treatment at a slower rate but if he develop any further reaction we will discontinue his treatment completely with Rituxan. The patient is in agreement with the current plan.  He is monitored closely by the chemo nurse as well as the respiratory therapist. We will continue transfusion and supportive care as needed. Thank you so much for taking good care of Mr. Ju, I will  continue to follow up the patient with you and assist in his management.  Disclaimer: This note was dictated with voice recognition software. Similar sounding words can inadvertently be transcribed and may be missed upon review. Eilleen Kempf, MD

## 2019-09-01 NOTE — TOC Progression Note (Signed)
Transition of Care [facility]) - Progression Note    Patient Details  Name: Isaac Pierce MRN: [id number] Date of Birth: 26-Oct-1960  Transition of [facility]) CM/SW Contact  Jesly Hartmann, Juliann Pulse, RN Phone Number: 09/01/2019, 1:13 PM  Clinical Narrative:[facility] able to accept rep Santiago Glad following for HHPT. Spoke to patient's sister [name] [phone]-informed patient has active medicaid out of state([place])-she must contact DSS of [place] to discontinue the medicaid, then show proff that it is cancelled, then our financial counselor can assist if proof of no insurance. Also informed her of medical records if she needed the records. Sister voiced understanding. Will contact [facility] closer to d/c for pcp appt.      Expected Discharge Plan: [place] Barriers to Discharge: Continued Medical Work up  Expected Discharge Plan and Services Expected Discharge Plan: [place] In-house Referral: Clinical Social Work     Living arrangements for the past 2 months: Mobile Home                                       Social Determinants of Health (SDOH) Interventions    Readmission Risk Interventions No flowsheet data found.

## 2019-09-01 NOTE — Significant Event (Addendum)
Patient seen and examined at 5:45 PM.  He was hypotensive, drowsy but arousable on calling his name.  He was not in significant respiratory distress.  Chest x-ray showed bilateral pleural effusions, possible pulmonary edema.  His systolic blood pressure was in the range of 80 mmHg.  We are transferring him to ICU.  I talked to Dr. Lamonte Sakai, PCCM I will start on Levophed which can be given through his Chemo-Port.  I will also give him  albumin.  We cannot give IV fluids at this time because of chest x-ray finding.  At the meantime, we can also give epinephrine 0.3mg  Coos/IM given the concern for anaphylactic shock.  Patient was already given a dose of Solu-Medrol. I did talk to the sister at the bedside.

## 2019-09-01 NOTE — Progress Notes (Signed)
PROGRESS NOTE    Isaac Pierce  SWN:[id number] DOB: 1960/10/16 DOA: 08/17/2019 PCP: Patient, No Pcp Per   Brief Narrative:  Patient is a 58 year old male with history of chronic back pain, GERD, anxiety, homelessness who initially presented with shortness of breath.  He complained of generalized weakness, loss of appetite, shortness of breath, productive cough, recent weight loss, leg swelling and distended abdomen when he presented.  He also noticed lumps underneath his axilla and neck.  He has family history of lymphoma.  He was smoking 1 to 2 packs of cigarettes a day.  When he presented he was tachycardic.  Chest x-ray showed mild vascular congestion, small bilateral effusion.  CT angiogram was negative for PE but showed bulky mediastinal, bilateral hilar, bilateral axillary lymphadenopathy.  Findings are concerning for primary lymphoproliferative process such as lymphoma.  Also found to have noncalcified pulmonary nodules measuring up to 10 mm and also hepatosplenomegaly.  He was also anemic on presentation so he was transfused with PRBCs. Total of 5 units of PRBCs during this hospitalization.    Oncology consulted.  Hospital course remarkable for respiratory distress which improved with Lasix.  He underwent biopsy of the right inguinal lymph node and bone marrow biopsy. He is persistently febrile due to lymphoma.  Also underwent thoracentesis for left-sided pleural effusion.  Bone marrow biopsy report showed atypical lymphocytes suspicious for lymphoproliferative disease.  Underwent chemo port placement.Oncology planning to start chemotherapy today.  Assessment & Plan:   Principal Problem:   Severe sepsis ([facility]) Active Problems:   Malignancy (HCC)   Pancytopenia (HCC)   Dyspnea   Pleural effusion   Hyponatremia   Protein-calorie malnutrition, severe   Non-Hodgkin lymphoma (HCC)   Pressure injury of skin   Lymphoproliferative disorder/lymphoma: Presented with 5-day history of dyspnea on  exertion, nausea, significant weight loss, diffuse lymphadenopathy.  CT angiogram showed bulky mediastinal, bilateral hilar, bilateral axillary lymphadenopathy.  Also found to have pulmonary nodules, hepatosplenomegaly.  Underwent CT abdomen/pelvis for further staging which showed massive splenomegaly with abdominal and inguinal lymphadenopathy.  Underwent ultrasound-guided liver biopsy by IR of right inguinal lymph node which showed monoclonal CD5 positive B-cell population.  Also underwent CT-guided aspiration and core biopsy of right iliac bone which  showed atypical lymphocytes suspicious for lymphoproliferative disease. Oncology following and planning to start on chemo as an inpatient.  Concern for sepsis/lactic acidosis/fever: Lactic acid was elevated on presentation.  Currently improved.  He is persistently febrile from his lymphoma.  Blood cultures have been negative so far.  Antibiotics have been discontinued.  LA improved.  One of the blood culture set showed gram-positive cocci most likely contaminant.  Chest x-ray does not show any clear pneumonia.  Repeat blood cultures are negative.  Persistent sinus tachycardia: Heart rate in the range of 110-1 20.  Echocardiogram done here shows normal ejection fraction, no significant other findings.  TSH is elevated.  Low T3, but normal T4.Most likely this is euthyroid sick syndrome.   He is in sinus tachycardia most likely secondary to combination of anxiety, fever.  Started on low-dose metoprolol.  Pancytopenia: Has leukopenia,anemia,thrombocytopenia.  So far transfused with a total of 5 units of PRBC.  Also has thrombocytopenia.  Will transfuse with platelets if platelets drop below 20000.  Continue folic acid.  Avoid anticoagulation.  Dyspnea/left-sided pleural effusion: Underwent thoracentesis by IR with removal of 550 mL of yellow fluid.  Currently does not complain of any shortness of breath.  Saturating fine on room air.  Hyponatremia: Will  continue to monitor  Bilateral lower extremity edema/anasarca/concern for DVT: Significantly improved with Lasix.  Anasarca secondary to low albumin from  Malignancy,poor appetite.  D-dimer was elevated at 5.  CT angiogram negative for PE.  Doppler did not show any DVT.On lasix 40 mg IV Echocardiogram showed normal ejection fraction.Changed  lasix to oral.  Tobacco abuse: Counseled cessation.  Continue nicotine patch.  GERD: Continue PPI  Elevated liver enzymes: Due to abdominal lymphadenopathy.  Continue to monitor.  Abdominal distention: Secondary to lymphadenopathy, hepatosplenomegaly,ascites.  Denies any abdominal pain today.  Severe protein calorie malnutrition: Dietitian following.        Nutrition Problem: Severe Malnutrition Etiology: chronic illness(concern for lymphoma)      DVT prophylaxis:SCD Code Status: Full Family Communication: Called sister on phone 08/30/19.  Discussed with father on phone on 08/31/19 Disposition Plan: Plan for starting chemotherapy as an inpatient   Consultants: Oncology, PCCM, IR  Procedures: Lymph node biopsy, bone marrow biopsy, thoracentesis  Antimicrobials:  Anti-infectives (From admission, onward)   Start     Dose/Rate Route Frequency Ordered Stop   08/30/19 1500  ceFAZolin (ANCEF) IVPB 2g/100 mL premix     2 g 200 mL/hr over 30 Minutes Intravenous To Radiology 08/30/19 1014 08/30/19 1612   08/18/19 2200  ceFEPIme (MAXIPIME) 2 g in sodium chloride 0.9 % 100 mL IVPB     2 g 200 mL/hr over 30 Minutes Intravenous Every 8 hours 08/18/19 1534 08/23/19 2255   08/18/19 1200  vancomycin (VANCOCIN) 1,250 mg in sodium chloride 0.9 % 250 mL IVPB  Status:  Discontinued     1,250 mg 166.7 mL/hr over 90 Minutes Intravenous Every 12 hours 08/18/19 0621 08/19/19 1733   08/18/19 0600  piperacillin-tazobactam (ZOSYN) IVPB 3.375 g     3.375 g 12.5 mL/hr over 240 Minutes Intravenous Every 8 hours 08/17/19 2251 08/18/19 1800   08/17/19 2300   vancomycin (VANCOCIN) 1,500 mg in sodium chloride 0.9 % 500 mL IVPB     1,500 mg 250 mL/hr over 120 Minutes Intravenous  Once 08/17/19 2250 08/18/19 0332   08/17/19 2300  piperacillin-tazobactam (ZOSYN) IVPB 3.375 g     3.375 g 100 mL/hr over 30 Minutes Intravenous  Once 08/17/19 2250 08/18/19 0131      Subjective:  Patient seen and examined the bedside this morning.  Looks very weak, sleepy this morning.  Denies any new complaints.  Hemodynamically stable .Still in sinus tachycardia.  Objective: Vitals:   08/31/19 1321 08/31/19 1447 08/31/19 2102 09/01/19 0535  BP:  (!) 104/59 (!) 104/55 116/60  Pulse: (!) 107 (!) 110 (!) 115 (!) 122  Resp:  (!) '22 20 19  ' Temp:  (!) 97.2 F (36.2 C) 98.9 F (37.2 C) 98.2 F (36.8 C)  TempSrc:  Oral    SpO2:  96% 90% (!) 86%  Weight:      Height:        Intake/Output Summary (Last 24 hours) at 09/01/2019 1152 Last data filed at 09/01/2019 0200 Gross per 24 hour  Intake 959.66 ml  Output 850 ml  Net 109.66 ml   Filed Weights   08/29/19 0435 08/30/19 0517 08/31/19 0649  Weight: 69.1 kg 70.5 kg 65.3 kg    Examination:   General exam: Deconditioned, debilitated, thin, chronically ill looking HEENT:PERRL,Oral mucosa moist, Ear/Nose normal on gross exam Respiratory system: Mild Decreased air entry on the left side. Cardiovascular system: S1 & S2 heard, RRR. No JVD, murmurs, rubs, gallops or clicks. Gastrointestinal system: Abdomen is distended,  soft and nontender. . Normal bowel sounds heard. Central nervous system: Alert and oriented. No focal neurological deficits. Extremities:No  edema, no clubbing ,no cyanosis Skin: No rashes, lesions or ulcers,no icterus ,no pallor     Data Reviewed: I have personally reviewed following labs and imaging studies  CBC: Recent Labs  Lab 08/26/19 0223  08/28/19 1904 08/29/19 0632  08/29/19 2250 08/30/19 0424 08/30/19 1037 08/31/19 0408 09/01/19 0419  WBC 4.7   < > 5.4 4.4  --   --  4.5  --   3.7* 3.1*  NEUTROABS 2.3  --   --   --   --   --   --   --  1.0* 0.9*  HGB 7.2*   < > 8.2* 7.4*   < > 7.5* 7.3* 7.5* 7.7* 7.6*  HCT 23.2*   < > 24.6* 22.3*   < > 22.6* 22.5* 22.7* 23.6* 22.9*  MCV 89.6   < > 88.2 88.1  --   --  89.3  --  88.4 87.7  PLT 43*   < > 54* 45*  --   --  53*  --  55* 51*   < > = values in this interval not displayed.   Basic Metabolic Panel: Recent Labs  Lab 08/26/19 1123 08/28/19 0601 08/29/19 0632 08/30/19 0424 09/01/19 0419  NA 134* 131* 130* 133* 129*  K 4.5 4.4 3.8 4.1 4.0  CL 95* 93* 95* 99 94*  CO2 '22 25 26 23 25  ' GLUCOSE 257* 74 85 113* 84  BUN 33* 23* 21* 23* 21*  CREATININE 1.01 0.73 0.66 0.69 0.56*  CALCIUM 9.9 10.1 9.9 10.4* 9.6   GFR: Estimated Creatinine Clearance: 95.2 mL/min (A) (by C-G formula based on SCr of 0.56 mg/dL (L)). Liver Function Tests: Recent Labs  Lab 08/26/19 1123 08/28/19 0601 08/29/19 0632 08/30/19 0424 09/01/19 0419  AST 67* 82* 72* 63* 58*  ALT 32 44 41 37 31  ALKPHOS 230* 249* 259* 282* 213*  BILITOT 1.0 0.8 1.0 0.9 1.0  PROT 4.9* 4.4* 4.2* 4.2* 4.2*  ALBUMIN 2.0* 1.7* 1.8* 1.7* 1.8*   No results for input(s): LIPASE, AMYLASE in the last 168 hours. No results for input(s): AMMONIA in the last 168 hours. Coagulation Profile: Recent Labs  Lab 08/30/19 1037  INR 1.2   Cardiac Enzymes: No results for input(s): CKTOTAL, CKMB, CKMBINDEX, TROPONINI in the last 168 hours. BNP (last 3 results) No results for input(s): PROBNP in the last 8760 hours. HbA1C: No results for input(s): HGBA1C in the last 72 hours. CBG: No results for input(s): GLUCAP in the last 168 hours. Lipid Profile: No results for input(s): CHOL, HDL, LDLCALC, TRIG, CHOLHDL, LDLDIRECT in the last 72 hours. Thyroid Function Tests: Recent Labs    08/30/19 1010 08/30/19 1207 08/31/19 0408  TSH 9.169*  --   --   FREET4  --   --  0.79  T3FREE  --  1.1*  --    Anemia Panel: No results for input(s): VITAMINB12, FOLATE, FERRITIN, TIBC,  IRON, RETICCTPCT in the last 72 hours. Sepsis Labs: No results for input(s): PROCALCITON, LATICACIDVEN in the last 168 hours.  Recent Results (from the past 240 hour(s))  Body fluid culture     Status: None   Collection Time: 08/22/19  4:00 PM   Specimen: PATH Cytology Pleural fluid  Result Value Ref Range Status   Specimen Description   Final    PLEURAL Performed at [facility]  [address] [place], [place] [address]    Special Requests   Final    NONE Performed at [facility], [place] [address], [place], [place] [address]    Gram Stain   Final    ABUNDANT WBC PRESENT, PREDOMINANTLY MONONUCLEAR NO ORGANISMS SEEN    Culture   Final    NO GROWTH 3 DAYS Performed at [facility] [address] [place], [place] [address]    Report Status 08/26/2019 FINAL  Final  Acid Fast Smear (AFB)     Status: None   Collection Time: 08/22/19  4:00 PM   Specimen: PATH Cytology Pleural fluid  Result Value Ref Range Status   AFB Specimen Processing Concentration  Final   Acid Fast Smear Negative  Final    Comment: (NOTE) Performed At: [facility] [place], [place] [id number] Rush Farmer MD [id number]    Source (AFB) PLEURAL  Final    Comment: Performed at [facility], [place] [address] [place], [place] [address]  MRSA PCR Screening     Status: None   Collection Time: 08/25/19 10:03 PM   Specimen: Nasal Mucosa; Nasopharyngeal  Result Value Ref Range Status   MRSA by PCR NEGATIVE NEGATIVE Final    Comment:        The GeneXpert MRSA Assay (FDA approved for NASAL specimens only), is one component of a comprehensive MRSA colonization surveillance program. It is not intended to diagnose MRSA infection nor to guide or monitor treatment for MRSA infections. Performed at [facility], [place] [address], [place], [place] [address]   Culture, blood (routine x 2)     Status: None    Collection Time: 08/26/19  7:49 AM   Specimen: BLOOD RIGHT ARM  Result Value Ref Range Status   Specimen Description   Final    BLOOD RIGHT ARM Performed at [facility] [address] [place], [place] [address]    Special Requests   Final    BOTTLES DRAWN AEROBIC AND ANAEROBIC Blood Culture adequate volume Performed at [facility] [address], [place], [place] [address]    Culture   Final    NO GROWTH 5 DAYS Performed at [facility], [place] [address] [place], [place] [address]    Report Status 08/31/2019 FINAL  Final  Culture, blood (routine x 2)     Status: None   Collection Time: 08/26/19  7:49 AM   Specimen: BLOOD RIGHT ARM  Result Value Ref Range Status   Specimen Description   Final    BLOOD RIGHT ARM Performed at [facility] [address] [place], [place] [address]    Special Requests   Final    BOTTLES DRAWN AEROBIC ONLY Blood Culture adequate volume Performed at [facility] [address], [place], [place] [address]    Culture   Final    NO GROWTH 5 DAYS Performed at [facility], [place] [address] [place], [place] [address]    Report Status 08/31/2019 FINAL  Final         Radiology Studies: Ir Imaging Guided Port Insertion  Result Date: 08/30/2019 INDICATION: Recent diagnosis of B cell lymphoma. In need of durable intravenous access for chemotherapy administration. EXAM: IMPLANTED PORT A CATH PLACEMENT WITH ULTRASOUND AND FLUOROSCOPIC GUIDANCE COMPARISON:  Chest CT - 08/25/2019 MEDICATIONS: Ancef 2 gm IV; The antibiotic was administered within an appropriate time interval prior to skin puncture.  ANESTHESIA/SEDATION: Moderate (conscious) sedation was employed during this procedure. A total of Versed 2 mg and Fentanyl 100 mcg was administered intravenously. Moderate Sedation Time: 23 minutes. The patient's level of consciousness and vital signs were monitored  continuously by radiology nursing throughout the procedure under my direct supervision. CONTRAST:  None FLUOROSCOPY TIME:  18 seconds (4.6 mGy) COMPLICATIONS: None immediate. PROCEDURE: The procedure, risks, benefits, and alternatives were explained to the patient. Questions regarding the procedure were encouraged and answered. The patient understands and consents to the procedure. The right neck and chest were prepped with chlorhexidine in a sterile fashion, and a sterile drape was applied covering the operative field. Maximum barrier sterile technique with sterile gowns and gloves were used for the procedure. A timeout was performed prior to the initiation of the procedure. Local anesthesia was provided with 1% lidocaine with epinephrine. After creating a small venotomy incision, a micropuncture kit was utilized to access the internal jugular vein. Real-time ultrasound guidance was utilized for vascular access including the acquisition of a permanent ultrasound image documenting patency of the accessed vessel. The microwire was utilized to measure appropriate catheter length. A subcutaneous port pocket was then created along the upper chest wall utilizing a combination of sharp and blunt dissection. The pocket was irrigated with sterile saline. A single lumen ISP power injectable port was chosen for placement. The 8 Fr catheter was tunneled from the port pocket site to the venotomy incision. The port was placed in the pocket. The external catheter was trimmed to appropriate length. At the venotomy, an 8 Fr peel-away sheath was placed over a guidewire under fluoroscopic guidance. The catheter was then placed through the sheath and the sheath was removed. Final catheter positioning was confirmed and documented with a fluoroscopic spot radiograph. The port was accessed with a Huber needle, aspirated and flushed with heparinized saline. The venotomy site was closed with an interrupted 4-0 Vicryl suture. The port  pocket incision was closed with interrupted 2-0 Vicryl suture and the skin was opposed with a running subcuticular 4-0 Vicryl suture. Dermabond and Steri-strips were applied to both incisions. Dressings were placed. The patient tolerated the procedure well without immediate post procedural complication. FINDINGS: After catheter placement, the tip lies within the superior cavoatrial junction. The catheter aspirates and flushes normally and is ready for immediate use. IMPRESSION: Successful placement of a right internal jugular approach power injectable Port-A-Cath. The catheter is ready for immediate use. Electronically Signed   By: Sandi Mariscal M.D.   On: 08/30/2019 16:58        Scheduled Meds: . acetaminophen  650 mg Oral Q6H  . allopurinol  300 mg Oral Daily  . Chlorhexidine Gluconate Cloth  6 each Topical Daily  . cyclophosphamide  562.5 mg/m2 (Treatment Plan Recorded) Intravenous Once  . diphenhydrAMINE  50 mg Intravenous Q6H  . DOXOrubicin  37.5 mg/m2 (Treatment Plan Recorded) Intravenous Once  . feeding supplement (ENSURE ENLIVE)  237 mL Oral BID BM  . feeding supplement (PRO-STAT SUGAR FREE 64)  30 mL Oral BID  . folic acid  1 mg Oral Daily  . furosemide  20 mg Oral Daily  . hydrocortisone   Rectal TID  . mouth rinse  15 mL Mouth Rinse BID  . metoprolol tartrate  12.5 mg Oral BID  . multivitamin with minerals  1 tablet Oral Daily  . nicotine  7 mg Transdermal Daily  . nystatin  5 mL Mouth/Throat QID  . palonosetron  0.25 mg Intravenous Once  .  pantoprazole  20 mg Oral Daily  . riTUXimab (RITUXAN) IV infusion  375 mg/m2 (Treatment Plan Recorded) Intravenous Once  . sodium chloride flush  10-40 mL Intracatheter Q12H  . vinCRIStine (ONCOVIN) CHEMO IV infusion  1.5 mg Intravenous Once   Continuous Infusions: . sodium chloride 50 mL/hr at 08/31/19 1736  . sodium chloride    . sodium chloride    . famotidine (PEPCID) IV    . famotidine (PEPCID) IV (ONCOLOGY)    . fosaprepitant  (EMEND) 150 mg + dexamethasone IV infusion       LOS: 15 days    Time spent: 35 mins.More than 50% of that time was spent in counseling and/or coordination of care.      Shelly Coss, MD [facility] Pager [phone]  If 7PM-7AM, please contact night-coverage [email or web address] Password [facility] 09/01/2019, 11:52 AM

## 2019-09-01 NOTE — Progress Notes (Signed)
Chemotherapy dosage and calculations verified. Discussed all questions and concerns at length with pt regarding R-CHOP therapy. Consent obtained, updated pt's sister per pt request.

## 2019-09-01 NOTE — Progress Notes (Deleted)
Pt lethargic and VS deteriorating. Rapid RN and MD paged to bedside.

## 2019-09-01 NOTE — Significant Event (Signed)
Rapid Response Event Note  Overview: Time Called: 1323 Arrival Time: 1325 Event Type: Respiratory, Cardiac, Hypotension  Initial Focused Assessment:  RRT called due to tachypnea, and tachycardia. Patient febrile receiving pre-meds for chemo. Tylenol already administered. Patient drowsy but had just received 50 of IV Benadryl. MD aware and plan to place on continuous pulse oximetry, continue telemetry, and RRT to continue rounding on patient. Attending defering to oncology team on chemo admin recommendations.     Event Summary: Name of Physician Notified: Dessie Coma MD at 1325  Name of Consulting Physician Notified: Antionette Poles. MD at [place]  Outcome: Stayed in room and stabalized  Event End Time: [place]

## 2019-09-01 NOTE — Significant Event (Signed)
Rapid Response Event Note  Overview: Time Called: 1750 Arrival Time: 1820 Event Type: Neurologic, Respiratory, Cardiac, Hypotension, MEWS  Initial Focused Assessment:  RRT called for post chemo reaction lethargy and hypotension. Patient very lethargic resting in bed, diaphoretic, and more shallow breathing. Tachypnea improved from earlier assessment. Chest X-ray ordered. Patient has received 100 mg total of Benadryl this shift and a dose of Demerol. Patient now on 6L [place]. Patient lethargic but awakes with noxious stimulation. Even while awake patient hypotensive. MD at bedside, patients respiratory effort improved from previous RRT event.  Plan to transfer to stepdown for closer observation and due to level of care needed. RRT to transfer with patient down to stepdown. Report called to bedside RN.   Event Summary: Name of Physician Notified: Dessie Coma MD at [place]  Name of Consulting Physician Notified: Antionette Poles. MD at [place]  Outcome: Transferred (Comment)  Event End Time: [place]

## 2019-09-01 NOTE — Progress Notes (Signed)
Pt experienced reaction to rituximab infusion. Began experiencing SOB, oxygen saturation declined, and pt becoming increasingly restless. Infusion stopped. NS infusing. Vitals obtained. MD paged, Rapid Response RN and RT paged. Meds given per symptom management orders, see MAR. Rigors still present, but have improved slightly. Pt still experiencing SOB but is maintaining oxygen saturation levels on 2L [place] following breathing treatment. Will continue to monitor.

## 2019-09-01 NOTE — Significant Event (Signed)
Aware about the events that happened after chemo infusion.  I will get chest x-ray.  We will continue to monitor

## 2019-09-01 NOTE — Significant Event (Signed)
Rapid Response Event Note  Overview: Time Called: 1100 Arrival Time: 1103 Event Type: MEWS  Initial Focused Assessment:  Notified of red MEWS. Patient known to RRT staff. Patient febrile, MD attributes to Lymphoma. With fever patient tachypnic and tachycardic. Plan to maintain patient on telemetry and administer Tylenol.   Event Summary: Name of Physician Notified: Dessie Coma MD at 1100  Name of Consulting Physician Notified: Antionette Poles. MD at [place]  Outcome: Stayed in room and stabalized  Event End Time: [place]

## 2019-09-01 NOTE — Progress Notes (Addendum)
Spoke w/ Dr. Julien Nordmann regarding RCHOP today - chemotherapy doses have been decreased by 25% for low hemoglobin, ANC, and platelet count. Granix will be added for D3 if the patient remains inpatient at that time instead of pegfilgrastim.   Additional premedications for rituximab have been added and we will plan to cap the infusion rate today at 50 mg/hr pending patient's toleration. If rituximab is tolerated at this rate and he continues treatment in the outpatient setting, he will need to receive it at the first time rate.   Isaac Pierce, PharmD, [facility] Pharmacist Pharmacy Phone: [phone] 09/01/2019

## 2019-09-01 NOTE — Progress Notes (Signed)
NAME:  Isaac Pierce, MRN:  [id number], DOB:  1961-04-26, LOS: 30 ADMISSION DATE:  08/17/2019, CONSULTATION DATE:  09/01/19  REFERRING MD:  Shelly Coss, MD , CHIEF COMPLAINT:  Hypotension   Brief History   58 y/o M admitted with SOB, weight loss and LE swelling.  Found to have pancytopenia with CT of chest with extensive lymphadenopathy, concern for malignancy. LN biopsy revealed non-Hodgkin's lymphoma. Left pleural effusion also showed findings consistent with lymphoma.  History of present illness   58 y.o. male who presented to [facility] on 11/17 with reports of shortness of breath for one month and one week of LE swelling.    On presentation, he was tachycardic & tachypneic without hypoxia or fever.  Initial labs notable for hyponatremia (Na 126), CO2 19, BUN 10/Cr 0.52, AG 15, BNP 93, troponin 22 > 127, lactic acid 6.4 > down to 4.6 after PRBC, WBC 3.4, Hgb 4.6, HCT 14.3, and platelets 39. D-Dimer 5.16. CXR showed mild central vascular congestion with small bilateral pleural effusions with associated atelectasis. CTA Chest was completed in the setting of dyspnea and elevated D-Dimer which was motion limited but negative for PE.  However, it did show extensive mediastinal lymphadenopathy, bilateral effusions with compressive atelectasis, hepatosplenomegaly with area of low density in the spleen.  The patient was admitted with pancytopenia and increased work of breathing for further evaluation. He was given 2 units of blood for anemia.  Developed worsening SOB and was given lasix.  He required 3L and has maintained.  COVID testing negative.   PCCM initially consulted 11/18 for increased work of breathing.    Pt reports he smokes but has not in a while.  He states he has been in prison and recently had a short stent for 1.5 week in jail.  "I'm not violent, just a little scuffle".  Reports prior drug use 20 years ago.  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  Notes three months of  weight loss, night sweats, decreased appetite.  Notes HIV negative at jail. Reports mother had lymphoma.   Past Medical History  GERD Back Pain  Anxiety Former IV drug abus Former smoker   [facility]   11/17 Admit with pancytopenia, received 2 units PRBC 11/18 PCCM consulted for SOB, additional 1 unit PRBC 11/19 IR performed R inguinal LN biopsy, consistent with non-Hodgkin B cell lymphoma 11/12 Left sided thoracentesis performed, exudative by Light's criteria, cytology suspicious for lymphoproliferative process 11/30 RIJ Port-A-Cath placed 12/2 Severe reaction to rituximab infusion, transferred to ICU, started on Levophed via Port  Consults:  11/18 PCCM 11/18 Medical Oncology  Procedures:  11/19 IR guided Right inguinal LN biopsy 11/22 L sided thoracentesis  Significant Diagnostic Tests:  CTA Chest 11/17 >> limited exam, no PE, bulky mediastinal, bilateral hilar and bilateral axillary lymphadenopathy, multiple bilateral non-calcified pulmonary nodules measuring up to 10 mm, small bilateral pleural effusions L>R with compressive atelectasis, hepatosplenomegaly with area of low density in the peripheral aspect of the spleen measuring 3.7 x 2.8 cm.   ECHO 11/18 >> EF 60%, no RV dysfunction R inguinal LN biopsy 11/19 >> non-Hodgkin's B cell lymphoma L thoracentesis 11/22 >> lymphoproliferative process  Micro Data:  COVID 11/17 >> negative  MRSA PCR 11/18 >> negative BCx2 11/17 >> 1/2 CoNS UA 11/18 >> negative  UC 11/17 >> negative BCx2 11/19, 11/26 >> negative  Antimicrobials:  Zosyn 11/17-11/18 Vancomycin 11/17-11/19 Cefepime 11/18-11/23 Ancef 11/30  Interim history/subjective:  Patient became febrile,  diaphoretic and hypotensive during first rituximab infusion (initiation of R-CHOP). He also reported shortness of breath and O2 sat dropped to the mid 80s requiring supplemental oxygen. He was started on low dose Levophed via [place] and given 25% 25 g  albumin.  Objective   Blood pressure (!) 100/54, pulse 82, temperature (!) 96.6 F (35.9 C), temperature source Axillary, resp. rate 14, height 5\' 11"  (1.803 m), weight 65.3 kg, SpO2 100 %.        Intake/Output Summary (Last 24 hours) at 09/01/2019 2041 Last data filed at 09/01/2019 2000 Gross per 24 hour  Intake 1899.66 ml  Output 250 ml  Net 1649.66 ml   Filed Weights   08/29/19 0435 08/30/19 0517 08/31/19 0649  Weight: 69.1 kg 70.5 kg 65.3 kg    Examination: General: diaphoretic, thin, chronically ill-appearing male in NAD HENT: PERRL, moist OMM Lungs: decreased at left base, otherwise CTA Cardiovascular: RRR, no m/r/g Abdomen: +splenomegaly, diffuse TTP, NABS Extremities: no edema, 2+ pedal pulses Neuro: A/Ox3 GU: deferred   Assessment & Plan:  Rituximab infusion reaction Non-Hodgkins B-cell lymphoma Shock - Wean pressors - Agree with volume resuscitation; would advise some caution to avoid volume overload - No role for antibiotics - Repeat blood cultures - Check lactic acid - Oncology following  Acute hypoxic respiratory failure CXR shows persistent bilateral effusions, L>R. Effusions are secondary to B-cell lymphoma. Should improve with chemotherapy. - Check ABG - No role for thoracentesis - Continue as needed nebulized therapy for SOB, wheezing - Provide incentive spirometry - No diuresis given hypotension  Pulmonary Nodules  Will need follow up of nodules, possibly related to B cell lymphoma, but given smoking history, repeat imaging is indicated  Hyponatremia Low level hyponatremia ?SIADH? given inappropriately elevated urine sodium on 11/18. Can repeat urine electrolytes, however, patient seems to have responded to fluid restriction. - Repeat BMP in AM  Best practice:  Diet: Regular Pain/Anxiety/Delirium protocol (if indicated): N/A VAP protocol (if indicated): N/A DVT prophylaxis: SCDs GI prophylaxis: PPI Glucose control: CBG checks Mobility:  Ad lib Code Status: Full Disposition: ICU, can likely transfer to floor in AM  Labs   CBC: Recent Labs  Lab 08/26/19 0223  08/28/19 1904 08/29/19 0632  08/29/19 2250 08/30/19 0424 08/30/19 1037 08/31/19 0408 09/01/19 0419  WBC 4.7   < > 5.4 4.4  --   --  4.5  --  3.7* 3.1*  NEUTROABS 2.3  --   --   --   --   --   --   --  1.0* 0.9*  HGB 7.2*   < > 8.2* 7.4*   < > 7.5* 7.3* 7.5* 7.7* 7.6*  HCT 23.2*   < > 24.6* 22.3*   < > 22.6* 22.5* 22.7* 23.6* 22.9*  MCV 89.6   < > 88.2 88.1  --   --  89.3  --  88.4 87.7  PLT 43*   < > 54* 45*  --   --  53*  --  55* 51*   < > = values in this interval not displayed.    Basic Metabolic Panel: Recent Labs  Lab 08/26/19 1123 08/28/19 0601 08/29/19 0632 08/30/19 0424 09/01/19 0419  NA 134* 131* 130* 133* 129*  K 4.5 4.4 3.8 4.1 4.0  CL 95* 93* 95* 99 94*  CO2 22 25 26 23 25   GLUCOSE 257* 74 85 113* 84  BUN 33* 23* 21* 23* 21*  CREATININE 1.01 0.73 0.66 0.69 0.56*  CALCIUM 9.9  10.1 9.9 10.4* 9.6   GFR: Estimated Creatinine Clearance: 95.2 mL/min (A) (by C-G formula based on SCr of 0.56 mg/dL (L)). Recent Labs  Lab 08/29/19 0632 08/30/19 0424 08/31/19 0408 09/01/19 0419  WBC 4.4 4.5 3.7* 3.1*    Liver Function Tests: Recent Labs  Lab 08/26/19 1123 08/28/19 0601 08/29/19 0632 08/30/19 0424 09/01/19 0419  AST 67* 82* 72* 63* 58*  ALT 32 44 41 37 31  ALKPHOS 230* 249* 259* 282* 213*  BILITOT 1.0 0.8 1.0 0.9 1.0  PROT 4.9* 4.4* 4.2* 4.2* 4.2*  ALBUMIN 2.0* 1.7* 1.8* 1.7* 1.8*   No results for input(s): LIPASE, AMYLASE in the last 168 hours. No results for input(s): AMMONIA in the last 168 hours.  ABG No results found for: PHART, PCO2ART, PO2ART, HCO3, TCO2, ACIDBASEDEF, O2SAT   Coagulation Profile: Recent Labs  Lab 08/30/19 1037  INR 1.2    Cardiac Enzymes: No results for input(s): CKTOTAL, CKMB, CKMBINDEX, TROPONINI in the last 168 hours.  HbA1C: No results found for: HGBA1C  CBG: No results for  input(s): GLUCAP in the last 168 hours.  Review of Systems:   Pertinent findings noted in HPI.  Past Medical History  He,  has a past medical history of Anxiety, Back pain, and GERD (gastroesophageal reflux disease).   Surgical History    Past Surgical History:  Procedure Laterality Date  . IR IMAGING GUIDED PORT INSERTION  08/30/2019     Social History   reports that he has been smoking cigarettes. He has never used smokeless tobacco. He reports previous alcohol use. He reports previous drug use.   Family History   His family history includes Lymphoma in his father and mother.   Allergies Allergies  Allergen Reactions  . Rituximab Rash    Had rash, anxiety, HTN & tachycardia ~15min into 1st infusion 08/27/19     Home Medications  Prior to Admission medications   Medication Sig Start Date End Date Taking? Authorizing Provider  hydrOXYzine (ATARAX/VISTARIL) 10 MG tablet Take 10 mg by mouth 3 (three) times daily as needed. 05/31/19  Yes [provider]  ibuprofen (ADVIL) 200 MG tablet Take 200 mg by mouth every 6 (six) hours as needed for moderate pain.   Yes [provider]  omeprazole (PRILOSEC) 20 MG capsule Take 20 mg by mouth daily. 07/23/19  Yes [provider]     Critical care time: 40 minutes.

## 2019-09-02 DIAGNOSIS — R579 Shock, unspecified: Secondary | ICD-10-CM

## 2019-09-02 LAB — COMPREHENSIVE METABOLIC PANEL
ALT: 27 U/L (ref 0–44)
AST: 53 U/L — ABNORMAL HIGH (ref 15–41)
Albumin: 2.3 g/dL — ABNORMAL LOW (ref 3.5–5.0)
Alkaline Phosphatase: 182 U/L — ABNORMAL HIGH (ref 38–126)
Anion gap: 14 (ref 5–15)
BUN: 28 mg/dL — ABNORMAL HIGH (ref 6–20)
CO2: 23 mmol/L (ref 22–32)
Calcium: 9.2 mg/dL (ref 8.9–10.3)
Chloride: 97 mmol/L — ABNORMAL LOW (ref 98–111)
Creatinine, Ser: 0.67 mg/dL (ref 0.61–1.24)
GFR calc Af Amer: >60 mL/min (ref >60)
GFR calc non Af Amer: >60 mL/min (ref >60)
Glucose, Bld: 133 mg/dL — ABNORMAL HIGH (ref 70–99)
Potassium: 3.9 mmol/L (ref 3.5–5.1)
Sodium: 134 mmol/L — ABNORMAL LOW (ref 135–145)
Total Bilirubin: 1 mg/dL (ref 0.3–1.2)
Total Protein: 4.6 g/dL — ABNORMAL LOW (ref 6.5–8.1)

## 2019-09-02 LAB — CBC WITH DIFFERENTIAL/PLATELET
Abs Immature Granulocytes: 0.02 10*3/uL (ref 0.00–0.07)
Basophils Absolute: 0 10*3/uL (ref 0.0–0.1)
Basophils Relative: 0 %
Eosinophils Absolute: 0 10*3/uL (ref 0.0–0.5)
Eosinophils Relative: 0 %
HCT: 23.4 % — ABNORMAL LOW (ref 39.0–52.0)
Hemoglobin: 7.7 g/dL — ABNORMAL LOW (ref 13.0–17.0)
Immature Granulocytes: 1 %
Lymphocytes Relative: 38 %
Lymphs Abs: 1.4 10*3/uL (ref 0.7–4.0)
MCH: 28.8 pg (ref 26.0–34.0)
MCHC: 32.9 g/dL (ref 30.0–36.0)
MCV: 87.6 fL (ref 80.0–100.0)
Monocytes Absolute: 0.3 10*3/uL (ref 0.1–1.0)
Monocytes Relative: 9 %
Neutro Abs: 1.9 10*3/uL (ref 1.7–7.7)
Neutrophils Relative %: 52 %
Platelets: 47 10*3/uL — ABNORMAL LOW (ref 150–400)
RBC: 2.67 MIL/uL — ABNORMAL LOW (ref 4.22–5.81)
RDW: 19.1 % — ABNORMAL HIGH (ref 11.5–15.5)
WBC: 3.6 10*3/uL — ABNORMAL LOW (ref 4.0–10.5)
nRBC: 0 % (ref 0.0–0.2)

## 2019-09-02 LAB — URIC ACID: Uric Acid, Serum: 5.3 mg/dL (ref 3.7–8.6)

## 2019-09-02 LAB — LACTATE DEHYDROGENASE: LDH: 559 U/L — ABNORMAL HIGH (ref 98–192)

## 2019-09-02 MED ORDER — HYDROXYZINE HCL 10 MG PO TABS
10.0000 mg | ORAL_TABLET | Freq: Three times a day (TID) | ORAL | Status: DC | PRN
  Administered 2019-09-02 – 2019-09-06 (×4): 10 mg via ORAL
  Filled 2019-09-02 (×7): qty 1

## 2019-09-02 MED ORDER — TBO-FILGRASTIM 480 MCG/0.8ML ~~LOC~~ SOSY
480.0000 ug | PREFILLED_SYRINGE | Freq: Every day | SUBCUTANEOUS | Status: DC
  Administered 2019-09-02 – 2019-09-05 (×4): 480 ug via SUBCUTANEOUS
  Filled 2019-09-02 (×4): qty 0.8

## 2019-09-02 MED ORDER — LACTATED RINGERS IV BOLUS
1000.0000 mL | Freq: Once | INTRAVENOUS | Status: AC
  Administered 2019-09-02: 18:00:00 1000 mL via INTRAVENOUS

## 2019-09-02 NOTE — Progress Notes (Signed)
HEMATOLOGY-ONCOLOGY PROGRESS NOTE  SUBJECTIVE: Events overnight noted.  Now in the ICU.  Off Levophed since early this morning.  Blood pressures improving.  Heart rate below 100 when I was in the room this morning.  He is not having any chest pain and not complaining of shortness of breath.  He reports some mild nausea but no vomiting.  No fever since yesterday afternoon.  He is more awake and talkative today.  REVIEW OF SYSTEMS:   Constitutional: No fever since yesterday morning Respiratory: Denies cough, dyspnea or wheezes Cardiovascular: Denies palpitation, chest discomfort Gastrointestinal: Abdominal distention better reports mild nausea but no vomiting Skin: Denies abnormal skin rashes Lymphatics: Lymphadenopathy in his neck and axillary areas improved Neurological:Denies numbness, tingling or new weaknesses Behavioral/Psych: Mood is stable, no new changes  Extremities: Reports improved lower extremity edema All other systems were reviewed with the patient and are negative.  I have reviewed the past medical history, past surgical history, social history and family history with the patient and they are unchanged from previous note.   PHYSICAL EXAMINATION: ECOG PERFORMANCE STATUS: 2 - Symptomatic, <50% confined to bed  Vitals:   09/02/19 0830 09/02/19 0845  BP: (!) 97/47 (!) 97/56  Pulse: 95 97  Resp: (!) 24 (!) 26  Temp:    SpO2: 94% 95%   Filed Weights   08/30/19 0517 08/31/19 0649 09/02/19 0500  Weight: 155 lb 8 oz (70.5 kg) 144 lb (65.3 kg) 147 lb 11.3 oz (67 kg)    Intake/Output from previous day: 12/02 0701 - 12/03 0700 In: 2128.5 [P.O.:240; I.V.:1320.2; IV Piggyback:568.3] Out: 1175 [Urine:1175]  GENERAL:alert, no distress and comfortable SKIN: Faint macular rash noted over abdomen EYES: normal, Conjunctiva are pink and non-injected, sclera clear OROPHARYNX:no exudate, no erythema and lips, buccal mucosa, and tongue normal  NECK: supple, thyroid normal size,  non-tender, without nodularity LYMPH: Significant improvement in his cervical and axillary lymphadenopathy LUNGS: Diminished breath sounds bilaterally HEART: Regular rate and rhythm, trace lower extremity edema ABDOMEN: Positive bowel sounds, less distended this morning, splenomegaly noted Musculoskeletal:no cyanosis of digits and no clubbing  NEURO: alert & oriented x 3 with fluent speech, no focal motor/sensory deficits  LABORATORY DATA:  I have reviewed the data as listed CMP Latest Ref Rng & Units 09/02/2019 09/01/2019 08/30/2019  Glucose 70 - 99 mg/dL 133(H) 84 113(H)  BUN 6 - 20 mg/dL 28(H) 21(H) 23(H)  Creatinine 0.61 - 1.24 mg/dL 0.67 0.56(L) 0.69  Sodium 135 - 145 mmol/L 134(L) 129(L) 133(L)  Potassium 3.5 - 5.1 mmol/L 3.9 4.0 4.1  Chloride 98 - 111 mmol/L 97(L) 94(L) 99  CO2 22 - 32 mmol/L '23 25 23  '$ Calcium 8.9 - 10.3 mg/dL 9.2 9.6 10.4(H)  Total Protein 6.5 - 8.1 g/dL 4.6(L) 4.2(L) 4.2(L)  Total Bilirubin 0.3 - 1.2 mg/dL 1.0 1.0 0.9  Alkaline Phos 38 - 126 U/L 182(H) 213(H) 282(H)  AST 15 - 41 U/L 53(H) 58(H) 63(H)  ALT 0 - 44 U/L 27 31 37    Lab Results  Component Value Date   WBC 3.6 (L) 09/02/2019   HGB 7.7 (L) 09/02/2019   HCT 23.4 (L) 09/02/2019   MCV 87.6 09/02/2019   PLT 47 (L) 09/02/2019   NEUTROABS 1.9 09/02/2019    Dg Chest 1 View  Result Date: 09/01/2019 CLINICAL DATA:  Shortness of breath. Oxygen desaturation. Chest pain. EXAM: CHEST  1 VIEW COMPARISON:  Radiograph 08/23/2019, CT 08/25/2019 FINDINGS: Patient is rotated. Right chest port with tip in the lower  SVC. Increase in bilateral pleural effusions from prior exam. Cardiomegaly which may have increased versus rotation. Increased interstitial thickening suspicious for pulmonary edema. No visualized pneumothorax. Nodularity in thoracic adenopathy seen on chest CT, not well demonstrated radiographically. IMPRESSION: 1. Increase in bilateral pleural effusions. Increased interstitial thickening consistent  with pulmonary edema. Overall findings suggest increased fluid overload. 2. Rotated exam. Cardiomegaly with questionable progression versus rotation. Electronically Signed   By: Keith Rake M.D.   On: 09/01/2019 17:58   Dg Chest 1 View  Result Date: 08/22/2019 CLINICAL DATA:  Status post thoracentesis. EXAM: CHEST  1 VIEW COMPARISON:  Earlier today at 0400 hours. FINDINGS: 1550 hours. Patient rotated left. Midline trachea. Normal heart size. Interval resolution of left-sided pleural fluid. No right-sided effusion. No pneumothorax. Skin fold projects over the left lower chest. Significantly improved left base airspace disease. Right infrahilar atelectasis remains. Again identified is a right midlung 7 mm nodule IMPRESSION: Interval left thoracentesis, without pneumothorax. Resolution of left lower lobe airspace disease, with mild right infrahilar atelectasis remaining. Suspicion of a 7 mm right midlung nodule, as before. Electronically Signed   By: Abigail Miyamoto M.D.   On: 08/22/2019 16:17   Dg Chest 2 View  Result Date: 08/17/2019 CLINICAL DATA:  Shortness of breath EXAM: CHEST - 2 VIEW COMPARISON:  None. FINDINGS: Small bilateral pleural effusions. Mild basilar airspace disease. Borderline heart size with mild central vascular congestion. No pneumothorax. IMPRESSION: Mild central vascular congestion with small bilateral effusions. Atelectasis or mild pneumonia at the left greater than right lung base. Electronically Signed   By: Donavan Foil M.D.   On: 08/17/2019 19:57   Dg Abd 1 View  Result Date: 08/21/2019 CLINICAL DATA:  Abdominal swelling and discomfort. EXAM: ABDOMEN - 1 VIEW COMPARISON:  CT, 08/18/2019. FINDINGS: No bowel dilation to suggest obstruction. Bowel is displaced inferiorly by the markedly enlarged spleen. Soft tissues are poorly defined. No evidence of a renal or ureteral stone. There are scattered aortic and iliac artery atherosclerotic calcifications. IMPRESSION: 1. No bowel  obstruction. 2. Enlarged spleen. Poorly defined soft tissues consistent with ascites as noted on the prior CT. Electronically Signed   By: Lajean Manes M.D.   On: 08/21/2019 14:20   Ct Angio Chest Pe W Or Wo Contrast  Result Date: 08/25/2019 CLINICAL DATA:  Tachycardia. 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 EXAM: CT ANGIOGRAPHY CHEST WITH CONTRAST TECHNIQUE: Multidetector CT imaging of the chest was performed using the standard protocol during bolus administration of intravenous contrast. Multiplanar CT image reconstructions and MIPs were obtained to evaluate the vascular anatomy. CONTRAST:  141m OMNIPAQUE IOHEXOL 350 MG/ML SOLN COMPARISON:  Chest radiograph dated 08/23/2019. CTA chest dated 08/17/2019. FINDINGS: Cardiovascular: Evaluation is constrained by respiratory motion, particularly in the bilateral lower lobes. Within that constraint, there is no evidence of pulmonary embolism to the segmental level. No evidence of thoracic aortic aneurysm or dissection. The heart is normal in size. Moderate pericardial soft tissue/metastases inferiorly (series 4/image 73), unchanged, likely related to the patient's known lymphoproliferative disorder. Mediastinum/Nodes: Bulky thoracic lymphadenopathy, related to the patient's known lymphoproliferative disorder, including: --2.3 cm short axis high right paratracheal node (series 4/image 28) --3.0 cm short axis AP window node (series 4/image 35) --1.4 cm short axis right hilar node (series 4/image 42) --2.6 cm short axis subcarinal node (series 4/image 43) --2.1 cm short axis intralobar/right lower lobe node (series 4/image 55) Visualized thyroid is unremarkable. Lungs/Pleura: Small bilateral pulmonary nodules, most of which are pleural/subpleural, unchanged from recent CT, including  a dominant 9 mm nodule in the posterior right lower lobe (series 6/image 36). Dominant central pulmonary nodule measures 8 mm in the left lower lobe (series 6/image 78). These findings are  likely related to the patient's known lymphoma. Moderate left pleural effusion, stable versus mildly increased, partially loculated. Trace right pleural effusion, mildly decreased. Associated bilateral lower lobe atelectasis. No pneumothorax. Upper Abdomen: Visualized upper abdomen is notable for a small hiatal hernia, masses splenomegaly, and a splenic infarct, better evaluated on recent CT abdomen/pelvis. Musculoskeletal: Degenerative changes of the visualized thoracolumbar spine. Review of the MIP images confirms the above findings. IMPRESSION: No evidence of pulmonary embolism. Bulky thoracic lymphadenopathy, pericardial thickening/soft tissue, and bilateral pulmonary nodules, likely related to the patient's known B-cell lymphoma, grossly unchanged. Moderate left pleural effusion, stable versus mildly increased, partially loculated. Trace right pleural effusion, mildly decreased. Associated bilateral lower lobe atelectasis. Overall, there is no significant interval change from recent CTs. Electronically Signed   By: Julian Hy M.D.   On: 08/25/2019 22:00   Ct Angio Chest Pe W/cm &/or Wo Cm  Result Date: 08/17/2019 CLINICAL DATA:  Shortness of breath for 1 month EXAM: CT ANGIOGRAPHY CHEST WITH CONTRAST TECHNIQUE: Multidetector CT imaging of the chest was performed using the standard protocol during bolus administration of intravenous contrast. Multiplanar CT image reconstructions and MIPs were obtained to evaluate the vascular anatomy. CONTRAST:  124m OMNIPAQUE IOHEXOL 350 MG/ML SOLN COMPARISON:  Chest x-ray 08/17/2019 FINDINGS: Cardiovascular: Suboptimal contrast bolus timing and respiratory motion artifact degraded examination of the more distal pulmonary arterial tree. No filling defect within the main or lobar branch pulmonary arteries. No evidence of right heart strain. Heart size is normal. There is pericardial thickening. Thoracic aorta is normal in course and caliber. Mediastinum/Nodes: Bulky  mediastinal, bilateral hilar, and bilateral axillary lymphadenopathy. Thyroid, trachea, and esophagus are grossly unremarkable. Lungs/Pleura: Small bilateral pleural effusions, left greater than right with associated compressive atelectasis. There are numerous bilateral noncalcified pulmonary nodules. Reference nodules include 10 mm right lower lobe juxtapleural nodule (series 6, image 88). 9 mm medial right lower lobe nodule (series 6, image 106). 7 mm anterior right middle lobe nodule (series 6, image 107). 7 mm left lower lobe nodule (series 6, image 90). 8 mm lingular nodule (series 6, image 92). No pneumothorax. Upper Abdomen: Hepatosplenomegaly within the visualized upper abdomen. Ill-defined area of low density within the peripheral aspect of the spleen measuring approximately 3.7 x 2.8 cm (series 4, image 135). Musculoskeletal: No chest wall abnormality. No acute or significant osseous findings. Review of the MIP images confirms the above findings. IMPRESSION: 1. Limited exam. No filling defect to the lobar branch level to suggest pulmonary embolism. 2. Bulky mediastinal, bilateral hilar, and bilateral axillary lymphadenopathy. Findings concerning for primary lymphoproliferative process such as lymphoma. Metastatic disease secondary to unknown primary is also a consideration. 3. Multiple bilateral noncalcified pulmonary nodules measuring up to 10 mm, likely a result of the same process as listed above. 4. Small bilateral pleural effusions, left greater than right with associated compressive atelectasis. 5. Hepatosplenomegaly within the visualized upper abdomen with ill-defined area of low density within the peripheral aspect of the spleen measuring 3.7 x 2.8 cm. Findings are concerning for lymphomatous involvement versus metastatic disease. Alternatively, sequela of splenic injury could have a similar appearance. These results were called by telephone at the time of interpretation on 08/17/2019 at 8:49 pm to  provider MARGAUX VENTER , who verbally acknowledged these results. Electronically Signed   By: NHart Carwin  Plundo M.D.   On: 08/17/2019 20:51   US Abdomen Complete  Result Date: 08/21/2019 CLINICAL DATA:  Abdominal pain and distention. History of alcohol and drug abuse. EXAM: ABDOMEN ULTRASOUND COMPLETE COMPARISON:  None. FINDINGS: Gallbladder: Small gallstones and sludge. At least 1 polyp noted measuring 3 mm. There is pericholecystic fluid. No sonographic Murphy's sign. Common bile duct: Diameter: 4 mm Liver: Liver appears mildly enlarged. Increased liver parenchymal echogenicity. No mass or focal lesion. Portal vein is patent on color Doppler imaging with normal direction of blood flow towards the liver. IVC: No abnormality visualized. Pancreas: Hypoechoic lesion either adjacent to or within the pancreatic head measuring 2.3 x 1.4 cm. This could reflect a pancreatic mass or adjacent enlarged gastrohepatic ligament lymph node a less well-defined hypoechoic area is seen in the pancreas below this. No pancreatic duct dilation. Spleen: Enlarged spleen measuring 24 x 9 x 23 cm. No splenic mass or focal lesion. Right Kidney: Length: 12.1 cm. Normal parenchymal echogenicity. There are hypo to anechoic cortical lesions that are consistent with cysts. Mild hydronephrosis. No stones. Left Kidney: Length: 12.5 cm. Echogenicity within normal limits. No mass or hydronephrosis visualized. Abdominal aorta: No aneurysm visualized. Other findings: Bilateral pleural effusions. IMPRESSION: 1. No convincing acute finding. There are small gallstones, gallbladder sludge and 1 small polyp as well as pericholecystic fluid, but no sonographic Murphy's sign. Gallbladder is also only mildly to moderately distended. No convincing acute cholecystitis. 2. Hepatosplenomegaly. Increased echogenicity of the liver consistent with hepatic steatosis. No liver mass. 3. Bilateral pleural effusions. 4. Mild right hydronephrosis. Hypoechoic to  anechoic right renal masses that are likely cysts but not fully characterized. Electronically Signed   By: Lajean Manes M.D.   On: 08/21/2019 14:28   Ct Abdomen Pelvis W Contrast  Result Date: 08/18/2019 CLINICAL DATA:  58 year old with pancytopenia and chest lymphadenopathy. Concern for lymphoproliferative disorder. EXAM: CT ABDOMEN AND PELVIS WITH CONTRAST TECHNIQUE: Multidetector CT imaging of the abdomen and pelvis was performed using the standard protocol following bolus administration of intravenous contrast. CONTRAST:  164m OMNIPAQUE IOHEXOL 300 MG/ML  SOLN COMPARISON:  Chest CT 08/17/2019 FINDINGS: Lower chest: Small bilateral pleural effusions. Partial visualization of the right infrahilar lymphadenopathy. There may be markedly enlarged lymph nodes adjacent to the distal esophagus. Small amount of pericardial fluid. Again noted is interstitial thickening in the right lower lobe with posterior consolidation in the right lower lobe. Again noted is a elongated nodular structure in the right lower lobe measuring 9 mm on sequence 6, image 20. Pleural-based nodules in the right lower lobe and right middle lobe are similar to the recent comparison examination. Again noted is a pleural-based nodule in the lingula. Focal pleural thickening along the base of the left major fissure is asimilar to the previous examination. Hepatobiliary: Normal appearance of the liver. No discrete liver lesions identified. Main portal venous system is patent. Gallbladder is decompressed. No significant biliary dilatation. Pancreas: Unremarkable. No pancreatic ductal dilatation or surrounding inflammatory changes. Spleen: Spleen is massive for size measuring 27.0 x 10.6 x 17.4 cm, splenic volume is 2489 mL. Scattered areas of non enhancement throughout the periphery of the spleen probably represent infarcts associated with the large size of the spleen. Indeterminate lesion along the left posterior aspect of spleen on sequence 2,  image 44 that measures up to 3.8 cm. Small amount of fluid posterior to the spleen near the left hemidiaphragm. Adrenals/Urinary Tract: Adrenal glands are poorly characterized on this examination but no gross abnormality. Compression on the  left kidney due to the splenomegaly. Negative for hydronephrosis. Question fullness of the right renal pelvis but this area is poorly characterized. Mild distention of the urinary bladder. High-density material in the urinary bladder could be related to recent chest CT. Stomach/Bowel: Cannot exclude a hiatal hernia. Stomach is compressed by lymph nodes and the splenomegaly. No evidence for acute bowel inflammation or bowel obstruction. Vascular/Lymphatic: Diffuse atherosclerotic calcifications in the abdominal aorta without aneurysm. Ectasias involving the celiac trunk, measuring roughly 1.1 cm. There is probably stenosis involving the proximal SMA but poorly characterized on this examination. Venous structures are unremarkable. Evidence for enlarged lymph nodes in the gastrohepatic ligament region. Enlarged lymph nodes in the periaortic region. Enlarged lymph nodes along the iliac nodal chains. Markedly enlarged lymph nodes in the right groin. There is a index lymph node in the right groin that measures 2.8 x 2.1 cm on sequence 2, image 78. Multiple small lymph nodes in both inguinal regions. Reproductive: Prostate is unremarkable. Other: Small amount of free fluid in the pelvis. There appears to be free fluid in the left lower quadrant below the spleen. Negative for free air. Diffuse subcutaneous edema. Dependent fluid in the paraspinal tissues in the lumbar spine. Musculoskeletal: Disc space narrowing at L5-S1. No suspicious bone lesions. IMPRESSION: 1. Massive splenomegaly with abdominal and inguinal lymphadenopathy. Findings are suggestive for a lymphoproliferative process such as lymphoma. 2. Evidence for multiple splenic infarcts likely related to the splenomegaly. Cannot  exclude a splenic lesion along the left posterior aspect measuring up to 3.8 cm. 3. Evidence for fluid overload state with diffuse subcutaneous edema, bilateral pleural effusions and ascites. 4. Re-demonstration pleural-based nodularity at the lung bases. There is also septal thickening and a nodular structure in the right lower lobe. Findings are compatible with a neoplastic or lymphoproliferative process. 5. No discrete liver lesions. 6. Aortic Atherosclerosis (ICD10-I70.0). Concern for at least mild stenosis involving the proximal SMA. Ectasia of the celiac trunk. Electronically Signed   By: Markus Daft M.D.   On: 08/18/2019 12:59   Ct Biopsy  Result Date: 08/19/2019 INDICATION: 58 year old male with pancytopenia. He presents for bone marrow biopsy as an inpatient. EXAM: CT GUIDED BONE MARROW ASPIRATION AND CORE BIOPSY Interventional Radiologist:  Criselda Peaches, MD MEDICATIONS: None. ANESTHESIA/SEDATION: 2 mg Versed were administered for anxiolysis. This does not constitute moderate sedation. FLUOROSCOPY TIME:  None. COMPLICATIONS: None immediate. Estimated blood loss: <25 mL PROCEDURE: Informed written consent was obtained from the patient after a thorough discussion of the procedural risks, benefits and alternatives. All questions were addressed. Maximal Sterile Barrier Technique was utilized including caps, mask, sterile gowns, sterile gloves, sterile drape, hand hygiene and skin antiseptic. A timeout was performed prior to the initiation of the procedure. The patient was positioned prone and non-contrast localization CT was performed of the pelvis to demonstrate the iliac marrow spaces. Maximal barrier sterile technique utilized including caps, mask, sterile gowns, sterile gloves, large sterile drape, hand hygiene, and betadine prep. Under sterile conditions and local anesthesia, an 11 gauge coaxial bone biopsy needle was advanced into the right iliac marrow space. Needle position was confirmed with  CT imaging. Initially, bone marrow aspiration was performed. Next, the 11 gauge outer cannula was utilized to obtain a right iliac bone marrow core biopsy. Needle was removed. Hemostasis was obtained with compression. The patient tolerated the procedure well. Samples were prepared with the cytotechnologist. IMPRESSION: Technically successful CT-guided bone marrow aspiration and biopsy of the right iliac bone. Electronically Signed   By:  Jacqulynn Cadet M.D.   On: 08/19/2019 10:38   Dg Chest Port 1 View  Result Date: 08/23/2019 CLINICAL DATA:  Shortness of breath EXAM: PORTABLE CHEST 1 VIEW COMPARISON:  Radiograph 08/22/2019 FINDINGS: Mild left anterior obliquity. Redemonstration of a 9 mm nodule in the right mid lung, seen on comparison CT. Stable bandlike areas of atelectasis and/or scarring. Additional hazy basilar atelectatic changes are present. No significant reaccumulation of the left effusion. No right effusion. Cardiomediastinal contours are stable. No acute osseous or soft tissue abnormality. IMPRESSION: 1. Unchanged 9 mm nodule in the right mid lung, seen on comparison CT. 2. No significant reaccumulation of the left effusion post thoracentesis. 3. Stable bandlike areas of atelectasis and/or scarring. Electronically Signed   By: Lovena Le M.D.   On: 08/23/2019 06:00   Dg Chest Port 1 View  Result Date: 08/22/2019 CLINICAL DATA:  Shortness of breath. EXAM: PORTABLE CHEST 1 VIEW COMPARISON:  08/21/2019 FINDINGS: 0400 hours. Leftward patient rotation. The cardio pericardial silhouette is enlarged. Interval progression of retrocardiac left base collapse/consolidation with persistent small left effusion. Infrahilar right base atelectasis or infiltrate noted. Nodular density noted at the left base may be a nipple shadow. IMPRESSION: Rotated film with retrocardiac left base collapse/consolidation and effusion. Small nodular density at the right base superimposed on the right fifth rib. Attention on  follow-up recommended. Electronically Signed   By: Misty Stanley M.D.   On: 08/22/2019 07:03   Dg Chest Port 1 View  Result Date: 08/21/2019 CLINICAL DATA:  Shortness of breath. EXAM: PORTABLE CHEST 1 VIEW COMPARISON:  08/20/2019 FINDINGS: 0426 hours. Cardiopericardial silhouette is at upper limits of normal for size. Small bilateral pleural effusions are again noted with bibasilar collapse/consolidation. The visualized bony structures of the thorax are intact. Telemetry leads overlie the chest. IMPRESSION: Stable exam. Small bilateral pleural effusions with bibasilar collapse/consolidation. Electronically Signed   By: Misty Stanley M.D.   On: 08/21/2019 07:21   Dg Chest Port 1 View  Result Date: 08/20/2019 CLINICAL DATA:  Shortness of breath EXAM: PORTABLE CHEST 1 VIEW COMPARISON:  Two days ago FINDINGS: Normal heart size. Haziness of the bilateral lower chest from layering pleural fluid with atelectasis by recent CT. No air bronchogram. No pneumothorax. IMPRESSION: Pleural effusions and atelectasis that have increased from 2 days ago. Electronically Signed   By: Monte Fantasia M.D.   On: 08/20/2019 07:33   Dg Chest Port 1 View  Result Date: 08/18/2019 CLINICAL DATA:  Shortness of breath EXAM: PORTABLE CHEST 1 VIEW COMPARISON:  08/18/2019 FINDINGS: Heart is normal size. Bilateral perihilar and lower lobe opacities. No visible effusions. No acute bony abnormality. IMPRESSION: Perihilar and lower lobe opacities could reflect atelectasis, edema or infection. Electronically Signed   By: Rolm Baptise M.D.   On: 08/18/2019 21:19   Dg Chest Port 1 View  Result Date: 08/18/2019 CLINICAL DATA:  Dyspnea. EXAM: PORTABLE CHEST 1 VIEW COMPARISON:  August 17, 2019. FINDINGS: Stable cardiomediastinal silhouette. No pneumothorax is noted. Mild bibasilar atelectasis or edema is noted with probable small pleural effusions. Bony thorax is unremarkable. IMPRESSION: Mild bibasilar atelectasis or edema is noted  with probable small pleural effusions. Electronically Signed   By: Marijo Conception M.D.   On: 08/18/2019 08:27   Ct Bone Marrow Biopsy & Aspiration  Result Date: 08/19/2019 INDICATION: 58 year old male with pancytopenia. He presents for bone marrow biopsy as an inpatient. EXAM: CT GUIDED BONE MARROW ASPIRATION AND CORE BIOPSY Interventional Radiologist:  Criselda Peaches, MD MEDICATIONS:  None. ANESTHESIA/SEDATION: 2 mg Versed were administered for anxiolysis. This does not constitute moderate sedation. FLUOROSCOPY TIME:  None. COMPLICATIONS: None immediate. Estimated blood loss: <25 mL PROCEDURE: Informed written consent was obtained from the patient after a thorough discussion of the procedural risks, benefits and alternatives. All questions were addressed. Maximal Sterile Barrier Technique was utilized including caps, mask, sterile gowns, sterile gloves, sterile drape, hand hygiene and skin antiseptic. A timeout was performed prior to the initiation of the procedure. The patient was positioned prone and non-contrast localization CT was performed of the pelvis to demonstrate the iliac marrow spaces. Maximal barrier sterile technique utilized including caps, mask, sterile gowns, sterile gloves, large sterile drape, hand hygiene, and betadine prep. Under sterile conditions and local anesthesia, an 11 gauge coaxial bone biopsy needle was advanced into the right iliac marrow space. Needle position was confirmed with CT imaging. Initially, bone marrow aspiration was performed. Next, the 11 gauge outer cannula was utilized to obtain a right iliac bone marrow core biopsy. Needle was removed. Hemostasis was obtained with compression. The patient tolerated the procedure well. Samples were prepared with the cytotechnologist. IMPRESSION: Technically successful CT-guided bone marrow aspiration and biopsy of the right iliac bone. Electronically Signed   By: Jacqulynn Cadet M.D.   On: 08/19/2019 10:38   [place] Core  Biopsy (lymph Nodes)  Result Date: 08/18/2019 INDICATION: No known primary, now with extensive lymphadenopathy and splenomegaly worrisome for lymphoma. Please perform ultrasound-guided right inguinal lymph node biopsy for tissue diagnostic purposes. EXAM: ULTRASOUND-GUIDED RIGHT INGUINAL LYMPH NODE BIOPSY COMPARISON:  Chest CT-08/17/2019; CT abdomen and pelvis-earlier same day MEDICATIONS: None ANESTHESIA/SEDATION: None COMPLICATIONS: None immediate. TECHNIQUE: Informed written consent was obtained from the patient after a discussion of the risks, benefits and alternatives to treatment. Questions regarding the procedure were encouraged and answered. Initial ultrasound scanning demonstrated 2 adjacent pathologically enlarged right inguinal lymph nodes compatible with the findings seen on preceding abdominal CT. The dominant approximately 2.7 x 1.6 cm right inguinal lymph node was targeted for biopsy given location and sonographic window (image 7). An ultrasound image was saved for documentation purposes. The procedure was planned. A timeout was performed prior to the initiation of the procedure. The operative was prepped and draped in the usual sterile fashion, and a sterile drape was applied covering the operative field. A timeout was performed prior to the initiation of the procedure. Local anesthesia was provided with 1% lidocaine with epinephrine. Under direct ultrasound guidance, an 18 gauge core needle device was utilized to obtain to obtain 6 core needle biopsies of the dominant right inguinal lymph node. The samples were placed in saline and submitted to pathology. The needle was removed and hemostasis was achieved with manual compression. Post procedure scan was negative for significant hematoma. A dressing was placed. The patient tolerated the procedure well without immediate postprocedural complication. IMPRESSION: Technically successful ultrasound guided core needle biopsy of dominant right inguinal  lymph node. Electronically Signed   By: Sandi Mariscal M.D.   On: 08/18/2019 13:39   Ir Imaging Guided Port Insertion  Result Date: 08/30/2019 INDICATION: Recent diagnosis of B cell lymphoma. In need of durable intravenous access for chemotherapy administration. EXAM: IMPLANTED PORT A CATH PLACEMENT WITH ULTRASOUND AND FLUOROSCOPIC GUIDANCE COMPARISON:  Chest CT - 08/25/2019 MEDICATIONS: Ancef 2 gm IV; The antibiotic was administered within an appropriate time interval prior to skin puncture. ANESTHESIA/SEDATION: Moderate (conscious) sedation was employed during this procedure. A total of Versed 2 mg and Fentanyl 100 mcg was administered intravenously.  Moderate Sedation Time: 23 minutes. The patient's level of consciousness and vital signs were monitored continuously by radiology nursing throughout the procedure under my direct supervision. CONTRAST:  None FLUOROSCOPY TIME:  18 seconds (4.6 mGy) COMPLICATIONS: None immediate. PROCEDURE: The procedure, risks, benefits, and alternatives were explained to the patient. Questions regarding the procedure were encouraged and answered. The patient understands and consents to the procedure. The right neck and chest were prepped with chlorhexidine in a sterile fashion, and a sterile drape was applied covering the operative field. Maximum barrier sterile technique with sterile gowns and gloves were used for the procedure. A timeout was performed prior to the initiation of the procedure. Local anesthesia was provided with 1% lidocaine with epinephrine. After creating a small venotomy incision, a micropuncture kit was utilized to access the internal jugular vein. Real-time ultrasound guidance was utilized for vascular access including the acquisition of a permanent ultrasound image documenting patency of the accessed vessel. The microwire was utilized to measure appropriate catheter length. A subcutaneous port pocket was then created along the upper chest wall utilizing a  combination of sharp and blunt dissection. The pocket was irrigated with sterile saline. A single lumen ISP power injectable port was chosen for placement. The 8 Fr catheter was tunneled from the port pocket site to the venotomy incision. The port was placed in the pocket. The external catheter was trimmed to appropriate length. At the venotomy, an 8 Fr peel-away sheath was placed over a guidewire under fluoroscopic guidance. The catheter was then placed through the sheath and the sheath was removed. Final catheter positioning was confirmed and documented with a fluoroscopic spot radiograph. The port was accessed with a Huber needle, aspirated and flushed with heparinized saline. The venotomy site was closed with an interrupted 4-0 Vicryl suture. The port pocket incision was closed with interrupted 2-0 Vicryl suture and the skin was opposed with a running subcuticular 4-0 Vicryl suture. Dermabond and Steri-strips were applied to both incisions. Dressings were placed. The patient tolerated the procedure well without immediate post procedural complication. FINDINGS: After catheter placement, the tip lies within the superior cavoatrial junction. The catheter aspirates and flushes normally and is ready for immediate use. IMPRESSION: Successful placement of a right internal jugular approach power injectable Port-A-Cath. The catheter is ready for immediate use. Electronically Signed   By: Sandi Mariscal M.D.   On: 08/30/2019 16:58   Vas [place] Lower Extremity Venous (dvt)  Result Date: 08/18/2019  Lower Venous Study Indications: Elevated d dimer.  Comparison Study: no prior Performing Technologist: Abram Sander RVS  Examination Guidelines: A complete evaluation includes B-mode imaging, spectral Doppler, color Doppler, and power Doppler as needed of all accessible portions of each vessel. Bilateral testing is considered an integral part of a complete examination. Limited examinations for reoccurring indications may be  performed as noted.  +---------+---------------+---------+-----------+----------+--------------+ RIGHT    CompressibilityPhasicitySpontaneityPropertiesThrombus Aging +---------+---------------+---------+-----------+----------+--------------+ CFV      Full           Yes      Yes                                 +---------+---------------+---------+-----------+----------+--------------+ SFJ      Full                                                        +---------+---------------+---------+-----------+----------+--------------+  FV Prox  Full                                                        +---------+---------------+---------+-----------+----------+--------------+ FV Mid   Full                                                        +---------+---------------+---------+-----------+----------+--------------+ FV DistalFull                                                        +---------+---------------+---------+-----------+----------+--------------+ PFV      Full                                                        +---------+---------------+---------+-----------+----------+--------------+ POP      Full           Yes      Yes                                 +---------+---------------+---------+-----------+----------+--------------+ PTV      Full                                                        +---------+---------------+---------+-----------+----------+--------------+ PERO     Full                                                        +---------+---------------+---------+-----------+----------+--------------+   +---------+---------------+---------+-----------+----------+--------------+ LEFT     CompressibilityPhasicitySpontaneityPropertiesThrombus Aging +---------+---------------+---------+-----------+----------+--------------+ CFV      Full           Yes      Yes                                  +---------+---------------+---------+-----------+----------+--------------+ SFJ      Full                                                        +---------+---------------+---------+-----------+----------+--------------+ FV Prox  Full                                                        +---------+---------------+---------+-----------+----------+--------------+  FV Mid   Full                                                        +---------+---------------+---------+-----------+----------+--------------+ FV DistalFull                                                        +---------+---------------+---------+-----------+----------+--------------+ PFV      Full                                                        +---------+---------------+---------+-----------+----------+--------------+ POP      Full           Yes      Yes                                 +---------+---------------+---------+-----------+----------+--------------+ PTV      Full                                                        +---------+---------------+---------+-----------+----------+--------------+ PERO     Full                                                        +---------+---------------+---------+-----------+----------+--------------+     Summary: Right: There is no evidence of deep vein thrombosis in the lower extremity. No cystic structure found in the popliteal fossa. Left: There is no evidence of deep vein thrombosis in the lower extremity. No cystic structure found in the popliteal fossa.  *See table(s) above for measurements and observations. Electronically signed by Harold Barban MD on 08/18/2019 at 1:53:01 PM.    Final    US Thoracentesis Asp Pleural Space W/img Guide  Result Date: 08/24/2019 INDICATION: Patient with shortness of breath, left pleural effusion. Request made for diagnostic and therapeutic left thoracentesis. EXAM: ULTRASOUND GUIDED DIAGNOSTIC AND  THERAPEUTIC LEFT THORACENTESIS MEDICATIONS: 10 mL 1% lidocaine COMPLICATIONS: None immediate. PROCEDURE: An ultrasound guided thoracentesis was thoroughly discussed with the patient and questions answered. The benefits, risks, alternatives and complications were also discussed. The patient understands and wishes to proceed with the procedure. Written consent was obtained. Ultrasound was performed to localize and mark an adequate pocket of fluid in the left chest. The area was then prepped and draped in the normal sterile fashion. 1% Lidocaine was used for local anesthesia. Under ultrasound guidance a 6 Fr Safe-T-Centesis catheter was introduced. Thoracentesis was performed. The catheter was removed and a dressing applied. FINDINGS: A total of approximately 550 mL of yellow fluid was removed. Samples were sent to the laboratory as requested by the clinical team. IMPRESSION:  Successful ultrasound guided left thoracentesis yielding 550 mL of pleural fluid. Read by: Brynda Greathouse PA-C Electronically Signed   By: Jerilynn Mages.  Shick M.D.   On: 08/22/2019 16:59    ASSESSMENT AND PLAN: 1.    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  The patient presented with bulky lymphadenopathy. 2.  Pancytopenia 3.  Lactic acidosis/sepsis, resolved 4.  Dyspnea secondary to left greater than right pleural effusions, resolved 5.  Hyponatremia, improved 6.  Anxiety 7.  Chronic back pain 8.  Tobacco dependence 9.  Hypercalcemia  -Right inguinal lymph node biopsy consistent with non-Hodgkin B-cell lymphoma.  Bone marrow biopsy most consistent with chronic lymphocytic leukemia/small lymphocytic lymphoma-fish (B-cell panel) is pending, and pleural fluid showed atypical lymphocytes suspicious for lymphoproliferative process.  Received R-CHOP yesterday and again had an infusion reaction to Rituxan despite additional premedications.  He developed hypotension, hypoxia, and rigors.  He also has a faint rash.  He required  low-dose Levophed overnight but is now off.  Pressure improved and no longer tachycardic.Marland Kitchen  He seems to have some improvement in his lymphadenopathy and abdominal distention.  He reports mild nausea this morning. Will initiate Granix 480 mcg daily. -The patient is at high risk for tumor lysis syndrome due to bulky lymphadenopathy.  He has no signs of TLS on his labs this morning.  Continue Allopurinol 300 mg daily.  Continue normal saline at 50 cc/h.   -The patient presented with pancytopenia likely due to bone marrow involvement of his lymphoma.  Hemoglobin 7.7 today recommend PRBC transfusion for hemoglobin less than 8. Recommend platelet transfusion if platelets drop below 20,000 or active bleeding.     -He was counseled about smoking cessation.   LOS: 16 days   Isaac Bussing, DNP, AGPCNP-BC, AOCNP 09/02/19

## 2019-09-02 NOTE — Progress Notes (Signed)
PT Cancellation Note  Patient Details Name: Isaac Pierce MRN: [id number] DOB: [birth year]   Cancelled Treatment:    Reason Eval/Treat Not Completed: Medical issues which prohibited therapy Pt off Levophed since early this morning and RN reports BP remains low. RN requests to hold therapy today.  Will check back as schedule permits.   Yisel Megill,KATHrine E 09/02/2019, 12:49 PM Carmelia Bake, PT, DPT Acute Rehabilitation Services Office: [phone] Pager: [phone]

## 2019-09-02 NOTE — Progress Notes (Signed)
NAME:  Isaac Pierce, MRN:  [id number], DOB:  June 06, 1961, LOS: 46 ADMISSION DATE:  08/17/2019, CONSULTATION DATE:  09/02/19  REFERRING MD:  Shelly Coss, MD , CHIEF COMPLAINT:  Hypotension   Brief History   58 y/o M admitted with SOB, weight loss and LE swelling.  Found to have pancytopenia with CT of chest with extensive lymphadenopathy, concern for malignancy. LN biopsy revealed non-Hodgkin's lymphoma. Left pleural effusion also showed findings consistent with lymphoma.  Transferred to ICU 12/2 after severe reaction to rituximab requiring vasopressor support.  Past Medical History  GERD Back Pain  Anxiety Former IV drug abus Former smoker   [facility] Events   11/17 Admit with pancytopenia, received 2 units PRBC 11/18 PCCM consulted for SOB, additional 1 unit PRBC 11/19 IR performed R inguinal LN biopsy, consistent with non-Hodgkin B cell lymphoma 11/12 Left sided thoracentesis performed, exudative by Light's criteria, cytology suspicious for lymphoproliferative process 11/30 RIJ Port-A-Cath placed 12/2 Severe reaction to rituximab infusion, transferred to ICU, started on Levophed via Port 12/3 off levophed   Consults:  11/18 PCCM 11/18 Medical Oncology  Procedures:  11/19 IR guided Right inguinal LN biopsy 11/22 L sided thoracentesis  Significant Diagnostic Tests:  CTA Chest 11/17 >> limited exam, no PE, bulky mediastinal, bilateral hilar and bilateral axillary lymphadenopathy, multiple bilateral non-calcified pulmonary nodules measuring up to 10 mm, small bilateral pleural effusions L>R with compressive atelectasis, hepatosplenomegaly with area of low density in the peripheral aspect of the spleen measuring 3.7 x 2.8 cm.   ECHO 11/18 >> EF 60%, no RV dysfunction R inguinal LN biopsy 11/19 >> non-Hodgkin's B cell lymphoma L thoracentesis 11/22 >> lymphoproliferative process  Micro Data:  COVID 11/17 >> negative  MRSA PCR 11/18 >> negative BCx2 11/17 >> 1/2 CoNS  UA 11/18 >> negative  UC 11/17 >> negative BCx2 11/19, 11/26 >> negative BCx2 12/2>>  Antimicrobials:  Zosyn 11/17-11/18 Vancomycin 11/17-11/19 Cefepime 11/18-11/23 Ancef 11/30  Interim history/subjective:  No distress. Feels well   Objective   Blood pressure (Abnormal) 97/56, pulse 97, temperature 97.7 F (36.5 C), temperature source Oral, resp. rate (Abnormal) 26, height 5\' 11"  (1.803 m), weight 67 kg, SpO2 95 %.        Intake/Output Summary (Last 24 hours) at 09/02/2019 0947 Last data filed at 09/02/2019 0600 Gross per 24 hour  Intake 2128.51 ml  Output 1175 ml  Net 953.51 ml   Filed Weights   08/30/19 0517 08/31/19 0649 09/02/19 0500  Weight: 70.5 kg 65.3 kg 67 kg    Examination:  General: Is a frail 58 year old white male who appears comfortable currently he is in no acute distress but is rather malnourished HEENT normocephalic atraumatic no jugular venous distention appreciated Pulmonary: Clear to auscultation diminished both bases no accessory use Cardiac: Regular rate and rhythm without murmur rub or gallop Abdomen: Soft not tender no organomegaly brisk capillary refill Derm: Raised erythemic rash primarily over the abdomen and trunk Extremities warm and dry Neuro intact treatment  Resolved issues  Rituximab infusion reaction with associated anaphylactic/circulatory shock  Assessment & Plan:   Non-Hodgkins B-cell lymphoma Plan onc following   Acute hypoxic respiratory failure Persistent bilateral pleural effusions L>R secondary to lymphoma which should improve with chemo and do not require thoracentesis. Weaned to room air.   Plan Continue pulse oximetry Incentive spirometry Mobilization Holding diuretics given tenuous blood pressure Will need to reassess his chemotherapy regimen  Pulmonary Nodules  Follow-up post therapy  Hyponatremia Na 134 today, closer to baseline  Plan Trend  Anemia Hgb 7.7 Plan Trend Transfuse for hemoglobin less  than 7  Best practice:  Diet: Regular Pain/Anxiety/Delirium protocol (if indicated): N/A VAP protocol (if indicated): N/A DVT prophylaxis: SCDs GI prophylaxis: PPI Glucose control: CBG checks Mobility: Ad lib Code Status: Full Disposition: Transfer to floor later today  Erick Colace ACNP-BC [place] Pager # [phone] OR # [phone] if no answer

## 2019-09-02 NOTE — Progress Notes (Signed)
Pt stated "I am going to rip out my IV and lie and blame on you" to RN when RN began administering 0400 Albumin dose through said IV. RN will continue to monitor IV frequently.

## 2019-09-02 NOTE — Progress Notes (Signed)
Patient seen and examined the bedside this morning.  Blood pressure acceptable today.  She has been started on Levophed. Mental status looks significantly improved since yesterday.  He is not lethargic.  He is alert and awake, communicates well and knows current  Year.  Breathing looks fine.  He is on room air.  Auscultation revealed bilateral decreased air entry on the bases,more on the left , no crackles or wheezes.  Denies any abdomen pain, nausea or vomiting. He was sent to ICU after he developed anaphylactic shock while being given chemotherapy.  I had also ordered few doses of albumin for his hypotension. Patient is under critical care service today.  I will continue to follow him peripherally.  I will accept the patient graciously whenever PCCM feels he is stable for transfer to my service. As discussed with Lucendia Herrlich  this morning, PCCM will write a full note today.

## 2019-09-03 DIAGNOSIS — D649 Anemia, unspecified: Secondary | ICD-10-CM

## 2019-09-03 DIAGNOSIS — D61818 Other pancytopenia: Secondary | ICD-10-CM

## 2019-09-03 DIAGNOSIS — C8598 Non-Hodgkin lymphoma, unspecified, lymph nodes of multiple sites: Secondary | ICD-10-CM

## 2019-09-03 LAB — COMPREHENSIVE METABOLIC PANEL
ALT: 25 U/L (ref 0–44)
AST: 41 U/L (ref 15–41)
Albumin: 2 g/dL — ABNORMAL LOW (ref 3.5–5.0)
Alkaline Phosphatase: 146 U/L — ABNORMAL HIGH (ref 38–126)
Anion gap: 15 (ref 5–15)
BUN: 34 mg/dL — ABNORMAL HIGH (ref 6–20)
CO2: 20 mmol/L — ABNORMAL LOW (ref 22–32)
Calcium: 9 mg/dL (ref 8.9–10.3)
Chloride: 100 mmol/L (ref 98–111)
Creatinine, Ser: 0.77 mg/dL (ref 0.61–1.24)
GFR calc Af Amer: >60 mL/min (ref >60)
GFR calc non Af Amer: >60 mL/min (ref >60)
Glucose, Bld: 163 mg/dL — ABNORMAL HIGH (ref 70–99)
Potassium: 4.1 mmol/L (ref 3.5–5.1)
Sodium: 135 mmol/L (ref 135–145)
Total Bilirubin: 1 mg/dL (ref 0.3–1.2)
Total Protein: 4.2 g/dL — ABNORMAL LOW (ref 6.5–8.1)

## 2019-09-03 LAB — CBC WITH DIFFERENTIAL/PLATELET
Abs Immature Granulocytes: 0 10*3/uL (ref 0.00–0.07)
Band Neutrophils: 11 %
Basophils Absolute: 0 10*3/uL (ref 0.0–0.1)
Basophils Relative: 0 %
Eosinophils Absolute: 0 10*3/uL (ref 0.0–0.5)
Eosinophils Relative: 0 %
HCT: 22.1 % — ABNORMAL LOW (ref 39.0–52.0)
Hemoglobin: 7 g/dL — ABNORMAL LOW (ref 13.0–17.0)
Lymphocytes Relative: 12 %
Lymphs Abs: 0.4 10*3/uL — ABNORMAL LOW (ref 0.7–4.0)
MCH: 28.6 pg (ref 26.0–34.0)
MCHC: 31.7 g/dL (ref 30.0–36.0)
MCV: 90.2 fL (ref 80.0–100.0)
Monocytes Absolute: 0.2 10*3/uL (ref 0.1–1.0)
Monocytes Relative: 7 %
Neutro Abs: 2.8 10*3/uL (ref 1.7–7.7)
Neutrophils Relative %: 70 %
Platelets: 47 10*3/uL — ABNORMAL LOW (ref 150–400)
RBC: 2.45 MIL/uL — ABNORMAL LOW (ref 4.22–5.81)
RDW: 19.1 % — ABNORMAL HIGH (ref 11.5–15.5)
WBC: 3.4 10*3/uL — ABNORMAL LOW (ref 4.0–10.5)
nRBC: 0 % (ref 0.0–0.2)

## 2019-09-03 LAB — URIC ACID: Uric Acid, Serum: 5.7 mg/dL (ref 3.7–8.6)

## 2019-09-03 LAB — PREPARE RBC (CROSSMATCH)

## 2019-09-03 MED ORDER — SODIUM CHLORIDE 0.9% IV SOLUTION: Freq: Once | INTRAVENOUS | Status: DC

## 2019-09-03 NOTE — Progress Notes (Signed)
HEMATOLOGY-ONCOLOGY PROGRESS NOTE  SUBJECTIVE: Remains afebrile. Reported some dizziness this am when standing. Denies chest pain. No shortness of breath.  Abdomen feels softer. No nausea or vomiting. Lymph nodes feel smaller. No bleeding.   REVIEW OF SYSTEMS:   Constitutional: Denies fevers and chills Respiratory: Denies cough, dyspnea or wheezes Cardiovascular: Denies palpitation, chest discomfort Gastrointestinal: Abdominal distention better, denies nausea and vomiting Skin: Denies abnormal skin rashes Lymphatics: Lymphadenopathy in his neck and axillary areas improved Neurological:Denies numbness, tingling or new weaknesses Behavioral/Psych: Mood is stable, no new changes  Extremities: Reports improved lower extremity edema All other systems were reviewed with the patient and are negative.  I have reviewed the past medical history, past surgical history, social history and family history with the patient and they are unchanged from previous note.   PHYSICAL EXAMINATION: ECOG PERFORMANCE STATUS: 2 - Symptomatic, <50% confined to bed  Vitals:   09/03/19 1244 09/03/19 1300  BP: (!) 97/56 97/69  Pulse: (!) 106 (!) 103  Resp: 20 20  Temp: 98.4 F (36.9 C) 98.4 F (36.9 C)  SpO2: 97% 97%   Filed Weights   08/31/19 0649 09/02/19 0500 09/03/19 0500  Weight: 144 lb (65.3 kg) 147 lb 11.3 oz (67 kg) 146 lb 6.2 oz (66.4 kg)    Intake/Output from previous day: 12/03 0701 - 12/04 0700 In: 382.4 [I.V.:382.4] Out: 825 [Urine:825]  GENERAL:alert, no distress and comfortable SKIN: Faint macular rash noted over abdomen EYES: normal, Conjunctiva are pink and non-injected, sclera clear OROPHARYNX:no exudate, no erythema and lips, buccal mucosa, and tongue normal  NECK: supple, thyroid normal size, non-tender, without nodularity LYMPH: Significant improvement in his cervical and axillary lymphadenopathy LUNGS: Diminished breath sounds bilaterally HEART: Regular rate and rhythm, trace  lower extremity edema ABDOMEN: Positive bowel sounds, less distended this morning, splenomegaly noted Musculoskeletal:no cyanosis of digits and no clubbing  NEURO: alert & oriented x 3 with fluent speech, no focal motor/sensory deficits  LABORATORY DATA:  I have reviewed the data as listed CMP Latest Ref Rng & Units 09/03/2019 09/02/2019 09/01/2019  Glucose 70 - 99 mg/dL 163(H) 133(H) 84  BUN 6 - 20 mg/dL 34(H) 28(H) 21(H)  Creatinine 0.61 - 1.24 mg/dL 0.77 0.67 0.56(L)  Sodium 135 - 145 mmol/L 135 134(L) 129(L)  Potassium 3.5 - 5.1 mmol/L 4.1 3.9 4.0  Chloride 98 - 111 mmol/L 100 97(L) 94(L)  CO2 22 - 32 mmol/L 20(L) 23 25  Calcium 8.9 - 10.3 mg/dL 9.0 9.2 9.6  Total Protein 6.5 - 8.1 g/dL 4.2(L) 4.6(L) 4.2(L)  Total Bilirubin 0.3 - 1.2 mg/dL 1.0 1.0 1.0  Alkaline Phos 38 - 126 U/L 146(H) 182(H) 213(H)  AST 15 - 41 U/L 41 53(H) 58(H)  ALT 0 - 44 U/L '25 27 31    '$ Lab Results  Component Value Date   WBC 3.4 (L) 09/03/2019   HGB 7.0 (L) 09/03/2019   HCT 22.1 (L) 09/03/2019   MCV 90.2 09/03/2019   PLT 47 (L) 09/03/2019   NEUTROABS 2.8 09/03/2019    Dg Chest 1 View  Result Date: 09/01/2019 CLINICAL DATA:  Shortness of breath. Oxygen desaturation. Chest pain. EXAM: CHEST  1 VIEW COMPARISON:  Radiograph 08/23/2019, CT 08/25/2019 FINDINGS: Patient is rotated. Right chest port with tip in the lower SVC. Increase in bilateral pleural effusions from prior exam. Cardiomegaly which may have increased versus rotation. Increased interstitial thickening suspicious for pulmonary edema. No visualized pneumothorax. Nodularity in thoracic adenopathy seen on chest CT, not well demonstrated radiographically. IMPRESSION: 1.  Increase in bilateral pleural effusions. Increased interstitial thickening consistent with pulmonary edema. Overall findings suggest increased fluid overload. 2. Rotated exam. Cardiomegaly with questionable progression versus rotation. Electronically Signed   By: Keith Rake M.D.    On: 09/01/2019 17:58   Dg Chest 1 View  Result Date: 08/22/2019 CLINICAL DATA:  Status post thoracentesis. EXAM: CHEST  1 VIEW COMPARISON:  Earlier today at 0400 hours. FINDINGS: 1550 hours. Patient rotated left. Midline trachea. Normal heart size. Interval resolution of left-sided pleural fluid. No right-sided effusion. No pneumothorax. Skin fold projects over the left lower chest. Significantly improved left base airspace disease. Right infrahilar atelectasis remains. Again identified is a right midlung 7 mm nodule IMPRESSION: Interval left thoracentesis, without pneumothorax. Resolution of left lower lobe airspace disease, with mild right infrahilar atelectasis remaining. Suspicion of a 7 mm right midlung nodule, as before. Electronically Signed   By: Abigail Miyamoto M.D.   On: 08/22/2019 16:17   Dg Chest 2 View  Result Date: 08/17/2019 CLINICAL DATA:  Shortness of breath EXAM: CHEST - 2 VIEW COMPARISON:  None. FINDINGS: Small bilateral pleural effusions. Mild basilar airspace disease. Borderline heart size with mild central vascular congestion. No pneumothorax. IMPRESSION: Mild central vascular congestion with small bilateral effusions. Atelectasis or mild pneumonia at the left greater than right lung base. Electronically Signed   By: Donavan Foil M.D.   On: 08/17/2019 19:57   Dg Abd 1 View  Result Date: 08/21/2019 CLINICAL DATA:  Abdominal swelling and discomfort. EXAM: ABDOMEN - 1 VIEW COMPARISON:  CT, 08/18/2019. FINDINGS: No bowel dilation to suggest obstruction. Bowel is displaced inferiorly by the markedly enlarged spleen. Soft tissues are poorly defined. No evidence of a renal or ureteral stone. There are scattered aortic and iliac artery atherosclerotic calcifications. IMPRESSION: 1. No bowel obstruction. 2. Enlarged spleen. Poorly defined soft tissues consistent with ascites as noted on the prior CT. Electronically Signed   By: Lajean Manes M.D.   On: 08/21/2019 14:20   Ct Angio Chest Pe  W Or Wo Contrast  Result Date: 08/25/2019 CLINICAL DATA:  Tachycardia. 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 EXAM: CT ANGIOGRAPHY CHEST WITH CONTRAST TECHNIQUE: Multidetector CT imaging of the chest was performed using the standard protocol during bolus administration of intravenous contrast. Multiplanar CT image reconstructions and MIPs were obtained to evaluate the vascular anatomy. CONTRAST:  152m OMNIPAQUE IOHEXOL 350 MG/ML SOLN COMPARISON:  Chest radiograph dated 08/23/2019. CTA chest dated 08/17/2019. FINDINGS: Cardiovascular: Evaluation is constrained by respiratory motion, particularly in the bilateral lower lobes. Within that constraint, there is no evidence of pulmonary embolism to the segmental level. No evidence of thoracic aortic aneurysm or dissection. The heart is normal in size. Moderate pericardial soft tissue/metastases inferiorly (series 4/image 73), unchanged, likely related to the patient's known lymphoproliferative disorder. Mediastinum/Nodes: Bulky thoracic lymphadenopathy, related to the patient's known lymphoproliferative disorder, including: --2.3 cm short axis high right paratracheal node (series 4/image 28) --3.0 cm short axis AP window node (series 4/image 35) --1.4 cm short axis right hilar node (series 4/image 42) --2.6 cm short axis subcarinal node (series 4/image 43) --2.1 cm short axis intralobar/right lower lobe node (series 4/image 55) Visualized thyroid is unremarkable. Lungs/Pleura: Small bilateral pulmonary nodules, most of which are pleural/subpleural, unchanged from recent CT, including a dominant 9 mm nodule in the posterior right lower lobe (series 6/image 36). Dominant central pulmonary nodule measures 8 mm in the left lower lobe (series 6/image 78). These findings are likely related to the patient's known lymphoma. Moderate  left pleural effusion, stable versus mildly increased, partially loculated. Trace right pleural effusion, mildly decreased. Associated bilateral lower  lobe atelectasis. No pneumothorax. Upper Abdomen: Visualized upper abdomen is notable for a small hiatal hernia, masses splenomegaly, and a splenic infarct, better evaluated on recent CT abdomen/pelvis. Musculoskeletal: Degenerative changes of the visualized thoracolumbar spine. Review of the MIP images confirms the above findings. IMPRESSION: No evidence of pulmonary embolism. Bulky thoracic lymphadenopathy, pericardial thickening/soft tissue, and bilateral pulmonary nodules, likely related to the patient's known B-cell lymphoma, grossly unchanged. Moderate left pleural effusion, stable versus mildly increased, partially loculated. Trace right pleural effusion, mildly decreased. Associated bilateral lower lobe atelectasis. Overall, there is no significant interval change from recent CTs. Electronically Signed   By: Julian Hy M.D.   On: 08/25/2019 22:00   Ct Angio Chest Pe W/cm &/or Wo Cm  Result Date: 08/17/2019 CLINICAL DATA:  Shortness of breath for 1 month EXAM: CT ANGIOGRAPHY CHEST WITH CONTRAST TECHNIQUE: Multidetector CT imaging of the chest was performed using the standard protocol during bolus administration of intravenous contrast. Multiplanar CT image reconstructions and MIPs were obtained to evaluate the vascular anatomy. CONTRAST:  161m OMNIPAQUE IOHEXOL 350 MG/ML SOLN COMPARISON:  Chest x-ray 08/17/2019 FINDINGS: Cardiovascular: Suboptimal contrast bolus timing and respiratory motion artifact degraded examination of the more distal pulmonary arterial tree. No filling defect within the main or lobar branch pulmonary arteries. No evidence of right heart strain. Heart size is normal. There is pericardial thickening. Thoracic aorta is normal in course and caliber. Mediastinum/Nodes: Bulky mediastinal, bilateral hilar, and bilateral axillary lymphadenopathy. Thyroid, trachea, and esophagus are grossly unremarkable. Lungs/Pleura: Small bilateral pleural effusions, left greater than right with  associated compressive atelectasis. There are numerous bilateral noncalcified pulmonary nodules. Reference nodules include 10 mm right lower lobe juxtapleural nodule (series 6, image 88). 9 mm medial right lower lobe nodule (series 6, image 106). 7 mm anterior right middle lobe nodule (series 6, image 107). 7 mm left lower lobe nodule (series 6, image 90). 8 mm lingular nodule (series 6, image 92). No pneumothorax. Upper Abdomen: Hepatosplenomegaly within the visualized upper abdomen. Ill-defined area of low density within the peripheral aspect of the spleen measuring approximately 3.7 x 2.8 cm (series 4, image 135). Musculoskeletal: No chest wall abnormality. No acute or significant osseous findings. Review of the MIP images confirms the above findings. IMPRESSION: 1. Limited exam. No filling defect to the lobar branch level to suggest pulmonary embolism. 2. Bulky mediastinal, bilateral hilar, and bilateral axillary lymphadenopathy. Findings concerning for primary lymphoproliferative process such as lymphoma. Metastatic disease secondary to unknown primary is also a consideration. 3. Multiple bilateral noncalcified pulmonary nodules measuring up to 10 mm, likely a result of the same process as listed above. 4. Small bilateral pleural effusions, left greater than right with associated compressive atelectasis. 5. Hepatosplenomegaly within the visualized upper abdomen with ill-defined area of low density within the peripheral aspect of the spleen measuring 3.7 x 2.8 cm. Findings are concerning for lymphomatous involvement versus metastatic disease. Alternatively, sequela of splenic injury could have a similar appearance. These results were called by telephone at the time of interpretation on 08/17/2019 at 8:49 pm to provider MARGAUX VENTER , who verbally acknowledged these results. Electronically Signed   By: NDavina PokeM.D.   On: 08/17/2019 20:51   [place]domen Complete  Result Date: 08/21/2019 CLINICAL  DATA:  Abdominal pain and distention. History of alcohol and drug abuse. EXAM: ABDOMEN ULTRASOUND COMPLETE COMPARISON:  None. FINDINGS: Gallbladder: Small gallstones  and sludge. At least 1 polyp noted measuring 3 mm. There is pericholecystic fluid. No sonographic Murphy's sign. Common bile duct: Diameter: 4 mm Liver: Liver appears mildly enlarged. Increased liver parenchymal echogenicity. No mass or focal lesion. Portal vein is patent on color Doppler imaging with normal direction of blood flow towards the liver. IVC: No abnormality visualized. Pancreas: Hypoechoic lesion either adjacent to or within the pancreatic head measuring 2.3 x 1.4 cm. This could reflect a pancreatic mass or adjacent enlarged gastrohepatic ligament lymph node a less well-defined hypoechoic area is seen in the pancreas below this. No pancreatic duct dilation. Spleen: Enlarged spleen measuring 24 x 9 x 23 cm. No splenic mass or focal lesion. Right Kidney: Length: 12.1 cm. Normal parenchymal echogenicity. There are hypo to anechoic cortical lesions that are consistent with cysts. Mild hydronephrosis. No stones. Left Kidney: Length: 12.5 cm. Echogenicity within normal limits. No mass or hydronephrosis visualized. Abdominal aorta: No aneurysm visualized. Other findings: Bilateral pleural effusions. IMPRESSION: 1. No convincing acute finding. There are small gallstones, gallbladder sludge and 1 small polyp as well as pericholecystic fluid, but no sonographic Murphy's sign. Gallbladder is also only mildly to moderately distended. No convincing acute cholecystitis. 2. Hepatosplenomegaly. Increased echogenicity of the liver consistent with hepatic steatosis. No liver mass. 3. Bilateral pleural effusions. 4. Mild right hydronephrosis. Hypoechoic to anechoic right renal masses that are likely cysts but not fully characterized. Electronically Signed   By: Lajean Manes M.D.   On: 08/21/2019 14:28   Ct Abdomen Pelvis W Contrast  Result Date:  08/18/2019 CLINICAL DATA:  58 year old with pancytopenia and chest lymphadenopathy. Concern for lymphoproliferative disorder. EXAM: CT ABDOMEN AND PELVIS WITH CONTRAST TECHNIQUE: Multidetector CT imaging of the abdomen and pelvis was performed using the standard protocol following bolus administration of intravenous contrast. CONTRAST:  145m OMNIPAQUE IOHEXOL 300 MG/ML  SOLN COMPARISON:  Chest CT 08/17/2019 FINDINGS: Lower chest: Small bilateral pleural effusions. Partial visualization of the right infrahilar lymphadenopathy. There may be markedly enlarged lymph nodes adjacent to the distal esophagus. Small amount of pericardial fluid. Again noted is interstitial thickening in the right lower lobe with posterior consolidation in the right lower lobe. Again noted is a elongated nodular structure in the right lower lobe measuring 9 mm on sequence 6, image 20. Pleural-based nodules in the right lower lobe and right middle lobe are similar to the recent comparison examination. Again noted is a pleural-based nodule in the lingula. Focal pleural thickening along the base of the left major fissure is asimilar to the previous examination. Hepatobiliary: Normal appearance of the liver. No discrete liver lesions identified. Main portal venous system is patent. Gallbladder is decompressed. No significant biliary dilatation. Pancreas: Unremarkable. No pancreatic ductal dilatation or surrounding inflammatory changes. Spleen: Spleen is massive for size measuring 27.0 x 10.6 x 17.4 cm, splenic volume is 2489 mL. Scattered areas of non enhancement throughout the periphery of the spleen probably represent infarcts associated with the large size of the spleen. Indeterminate lesion along the left posterior aspect of spleen on sequence 2, image 44 that measures up to 3.8 cm. Small amount of fluid posterior to the spleen near the left hemidiaphragm. Adrenals/Urinary Tract: Adrenal glands are poorly characterized on this examination  but no gross abnormality. Compression on the left kidney due to the splenomegaly. Negative for hydronephrosis. Question fullness of the right renal pelvis but this area is poorly characterized. Mild distention of the urinary bladder. High-density material in the urinary bladder could be related to recent chest  CT. Stomach/Bowel: Cannot exclude a hiatal hernia. Stomach is compressed by lymph nodes and the splenomegaly. No evidence for acute bowel inflammation or bowel obstruction. Vascular/Lymphatic: Diffuse atherosclerotic calcifications in the abdominal aorta without aneurysm. Ectasias involving the celiac trunk, measuring roughly 1.1 cm. There is probably stenosis involving the proximal SMA but poorly characterized on this examination. Venous structures are unremarkable. Evidence for enlarged lymph nodes in the gastrohepatic ligament region. Enlarged lymph nodes in the periaortic region. Enlarged lymph nodes along the iliac nodal chains. Markedly enlarged lymph nodes in the right groin. There is a index lymph node in the right groin that measures 2.8 x 2.1 cm on sequence 2, image 78. Multiple small lymph nodes in both inguinal regions. Reproductive: Prostate is unremarkable. Other: Small amount of free fluid in the pelvis. There appears to be free fluid in the left lower quadrant below the spleen. Negative for free air. Diffuse subcutaneous edema. Dependent fluid in the paraspinal tissues in the lumbar spine. Musculoskeletal: Disc space narrowing at L5-S1. No suspicious bone lesions. IMPRESSION: 1. Massive splenomegaly with abdominal and inguinal lymphadenopathy. Findings are suggestive for a lymphoproliferative process such as lymphoma. 2. Evidence for multiple splenic infarcts likely related to the splenomegaly. Cannot exclude a splenic lesion along the left posterior aspect measuring up to 3.8 cm. 3. Evidence for fluid overload state with diffuse subcutaneous edema, bilateral pleural effusions and ascites. 4.  Re-demonstration pleural-based nodularity at the lung bases. There is also septal thickening and a nodular structure in the right lower lobe. Findings are compatible with a neoplastic or lymphoproliferative process. 5. No discrete liver lesions. 6. Aortic Atherosclerosis (ICD10-I70.0). Concern for at least mild stenosis involving the proximal SMA. Ectasia of the celiac trunk. Electronically Signed   By: Markus Daft M.D.   On: 08/18/2019 12:59   Ct Biopsy  Result Date: 08/19/2019 INDICATION: 58 year old male with pancytopenia. He presents for bone marrow biopsy as an inpatient. EXAM: CT GUIDED BONE MARROW ASPIRATION AND CORE BIOPSY Interventional Radiologist:  Criselda Peaches, MD MEDICATIONS: None. ANESTHESIA/SEDATION: 2 mg Versed were administered for anxiolysis. This does not constitute moderate sedation. FLUOROSCOPY TIME:  None. COMPLICATIONS: None immediate. Estimated blood loss: <25 mL PROCEDURE: Informed written consent was obtained from the patient after a thorough discussion of the procedural risks, benefits and alternatives. All questions were addressed. Maximal Sterile Barrier Technique was utilized including caps, mask, sterile gowns, sterile gloves, sterile drape, hand hygiene and skin antiseptic. A timeout was performed prior to the initiation of the procedure. The patient was positioned prone and non-contrast localization CT was performed of the pelvis to demonstrate the iliac marrow spaces. Maximal barrier sterile technique utilized including caps, mask, sterile gowns, sterile gloves, large sterile drape, hand hygiene, and betadine prep. Under sterile conditions and local anesthesia, an 11 gauge coaxial bone biopsy needle was advanced into the right iliac marrow space. Needle position was confirmed with CT imaging. Initially, bone marrow aspiration was performed. Next, the 11 gauge outer cannula was utilized to obtain a right iliac bone marrow core biopsy. Needle was removed. Hemostasis was  obtained with compression. The patient tolerated the procedure well. Samples were prepared with the cytotechnologist. IMPRESSION: Technically successful CT-guided bone marrow aspiration and biopsy of the right iliac bone. Electronically Signed   By: Jacqulynn Cadet M.D.   On: 08/19/2019 10:38   Dg Chest Port 1 View  Result Date: 08/23/2019 CLINICAL DATA:  Shortness of breath EXAM: PORTABLE CHEST 1 VIEW COMPARISON:  Radiograph 08/22/2019 FINDINGS: Mild left anterior obliquity.  Redemonstration of a 9 mm nodule in the right mid lung, seen on comparison CT. Stable bandlike areas of atelectasis and/or scarring. Additional hazy basilar atelectatic changes are present. No significant reaccumulation of the left effusion. No right effusion. Cardiomediastinal contours are stable. No acute osseous or soft tissue abnormality. IMPRESSION: 1. Unchanged 9 mm nodule in the right mid lung, seen on comparison CT. 2. No significant reaccumulation of the left effusion post thoracentesis. 3. Stable bandlike areas of atelectasis and/or scarring. Electronically Signed   By: Lovena Le M.D.   On: 08/23/2019 06:00   Dg Chest Port 1 View  Result Date: 08/22/2019 CLINICAL DATA:  Shortness of breath. EXAM: PORTABLE CHEST 1 VIEW COMPARISON:  08/21/2019 FINDINGS: 0400 hours. Leftward patient rotation. The cardio pericardial silhouette is enlarged. Interval progression of retrocardiac left base collapse/consolidation with persistent small left effusion. Infrahilar right base atelectasis or infiltrate noted. Nodular density noted at the left base may be a nipple shadow. IMPRESSION: Rotated film with retrocardiac left base collapse/consolidation and effusion. Small nodular density at the right base superimposed on the right fifth rib. Attention on follow-up recommended. Electronically Signed   By: Misty Stanley M.D.   On: 08/22/2019 07:03   Dg Chest Port 1 View  Result Date: 08/21/2019 CLINICAL DATA:  Shortness of breath. EXAM:  PORTABLE CHEST 1 VIEW COMPARISON:  08/20/2019 FINDINGS: 0426 hours. Cardiopericardial silhouette is at upper limits of normal for size. Small bilateral pleural effusions are again noted with bibasilar collapse/consolidation. The visualized bony structures of the thorax are intact. Telemetry leads overlie the chest. IMPRESSION: Stable exam. Small bilateral pleural effusions with bibasilar collapse/consolidation. Electronically Signed   By: Misty Stanley M.D.   On: 08/21/2019 07:21   Dg Chest Port 1 View  Result Date: 08/20/2019 CLINICAL DATA:  Shortness of breath EXAM: PORTABLE CHEST 1 VIEW COMPARISON:  Two days ago FINDINGS: Normal heart size. Haziness of the bilateral lower chest from layering pleural fluid with atelectasis by recent CT. No air bronchogram. No pneumothorax. IMPRESSION: Pleural effusions and atelectasis that have increased from 2 days ago. Electronically Signed   By: Monte Fantasia M.D.   On: 08/20/2019 07:33   Dg Chest Port 1 View  Result Date: 08/18/2019 CLINICAL DATA:  Shortness of breath EXAM: PORTABLE CHEST 1 VIEW COMPARISON:  08/18/2019 FINDINGS: Heart is normal size. Bilateral perihilar and lower lobe opacities. No visible effusions. No acute bony abnormality. IMPRESSION: Perihilar and lower lobe opacities could reflect atelectasis, edema or infection. Electronically Signed   By: Rolm Baptise M.D.   On: 08/18/2019 21:19   Dg Chest Port 1 View  Result Date: 08/18/2019 CLINICAL DATA:  Dyspnea. EXAM: PORTABLE CHEST 1 VIEW COMPARISON:  August 17, 2019. FINDINGS: Stable cardiomediastinal silhouette. No pneumothorax is noted. Mild bibasilar atelectasis or edema is noted with probable small pleural effusions. Bony thorax is unremarkable. IMPRESSION: Mild bibasilar atelectasis or edema is noted with probable small pleural effusions. Electronically Signed   By: Marijo Conception M.D.   On: 08/18/2019 08:27   Ct Bone Marrow Biopsy & Aspiration  Result Date: 08/19/2019 INDICATION:  58 year old male with pancytopenia. He presents for bone marrow biopsy as an inpatient. EXAM: CT GUIDED BONE MARROW ASPIRATION AND CORE BIOPSY Interventional Radiologist:  Criselda Peaches, MD MEDICATIONS: None. ANESTHESIA/SEDATION: 2 mg Versed were administered for anxiolysis. This does not constitute moderate sedation. FLUOROSCOPY TIME:  None. COMPLICATIONS: None immediate. Estimated blood loss: <25 mL PROCEDURE: Informed written consent was obtained from the patient after a thorough discussion  of the procedural risks, benefits and alternatives. All questions were addressed. Maximal Sterile Barrier Technique was utilized including caps, mask, sterile gowns, sterile gloves, sterile drape, hand hygiene and skin antiseptic. A timeout was performed prior to the initiation of the procedure. The patient was positioned prone and non-contrast localization CT was performed of the pelvis to demonstrate the iliac marrow spaces. Maximal barrier sterile technique utilized including caps, mask, sterile gowns, sterile gloves, large sterile drape, hand hygiene, and betadine prep. Under sterile conditions and local anesthesia, an 11 gauge coaxial bone biopsy needle was advanced into the right iliac marrow space. Needle position was confirmed with CT imaging. Initially, bone marrow aspiration was performed. Next, the 11 gauge outer cannula was utilized to obtain a right iliac bone marrow core biopsy. Needle was removed. Hemostasis was obtained with compression. The patient tolerated the procedure well. Samples were prepared with the cytotechnologist. IMPRESSION: Technically successful CT-guided bone marrow aspiration and biopsy of the right iliac bone. Electronically Signed   By: Jacqulynn Cadet M.D.   On: 08/19/2019 10:38   [place] Core Biopsy (lymph Nodes)  Result Date: 08/18/2019 INDICATION: No known primary, now with extensive lymphadenopathy and splenomegaly worrisome for lymphoma. Please perform ultrasound-guided right  inguinal lymph node biopsy for tissue diagnostic purposes. EXAM: ULTRASOUND-GUIDED RIGHT INGUINAL LYMPH NODE BIOPSY COMPARISON:  Chest CT-08/17/2019; CT abdomen and pelvis-earlier same day MEDICATIONS: None ANESTHESIA/SEDATION: None COMPLICATIONS: None immediate. TECHNIQUE: Informed written consent was obtained from the patient after a discussion of the risks, benefits and alternatives to treatment. Questions regarding the procedure were encouraged and answered. Initial ultrasound scanning demonstrated 2 adjacent pathologically enlarged right inguinal lymph nodes compatible with the findings seen on preceding abdominal CT. The dominant approximately 2.7 x 1.6 cm right inguinal lymph node was targeted for biopsy given location and sonographic window (image 7). An ultrasound image was saved for documentation purposes. The procedure was planned. A timeout was performed prior to the initiation of the procedure. The operative was prepped and draped in the usual sterile fashion, and a sterile drape was applied covering the operative field. A timeout was performed prior to the initiation of the procedure. Local anesthesia was provided with 1% lidocaine with epinephrine. Under direct ultrasound guidance, an 18 gauge core needle device was utilized to obtain to obtain 6 core needle biopsies of the dominant right inguinal lymph node. The samples were placed in saline and submitted to pathology. The needle was removed and hemostasis was achieved with manual compression. Post procedure scan was negative for significant hematoma. A dressing was placed. The patient tolerated the procedure well without immediate postprocedural complication. IMPRESSION: Technically successful ultrasound guided core needle biopsy of dominant right inguinal lymph node. Electronically Signed   By: Sandi Mariscal M.D.   On: 08/18/2019 13:39   [facility] Guided Port Insertion  Result Date: 08/30/2019 INDICATION: Recent diagnosis of B cell lymphoma. In  need of durable intravenous access for chemotherapy administration. EXAM: IMPLANTED PORT A CATH PLACEMENT WITH ULTRASOUND AND FLUOROSCOPIC GUIDANCE COMPARISON:  Chest CT - 08/25/2019 MEDICATIONS: Ancef 2 gm IV; The antibiotic was administered within an appropriate time interval prior to skin puncture. ANESTHESIA/SEDATION: Moderate (conscious) sedation was employed during this procedure. A total of Versed 2 mg and Fentanyl 100 mcg was administered intravenously. Moderate Sedation Time: 23 minutes. The patient's level of consciousness and vital signs were monitored continuously by radiology nursing throughout the procedure under my direct supervision. CONTRAST:  None FLUOROSCOPY TIME:  18 seconds (4.6 mGy) COMPLICATIONS: None immediate. PROCEDURE:  The procedure, risks, benefits, and alternatives were explained to the patient. Questions regarding the procedure were encouraged and answered. The patient understands and consents to the procedure. The right neck and chest were prepped with chlorhexidine in a sterile fashion, and a sterile drape was applied covering the operative field. Maximum barrier sterile technique with sterile gowns and gloves were used for the procedure. A timeout was performed prior to the initiation of the procedure. Local anesthesia was provided with 1% lidocaine with epinephrine. After creating a small venotomy incision, a micropuncture kit was utilized to access the internal jugular vein. Real-time ultrasound guidance was utilized for vascular access including the acquisition of a permanent ultrasound image documenting patency of the accessed vessel. The microwire was utilized to measure appropriate catheter length. A subcutaneous port pocket was then created along the upper chest wall utilizing a combination of sharp and blunt dissection. The pocket was irrigated with sterile saline. A single lumen ISP power injectable port was chosen for placement. The 8 Fr catheter was tunneled from the port  pocket site to the venotomy incision. The port was placed in the pocket. The external catheter was trimmed to appropriate length. At the venotomy, an 8 Fr peel-away sheath was placed over a guidewire under fluoroscopic guidance. The catheter was then placed through the sheath and the sheath was removed. Final catheter positioning was confirmed and documented with a fluoroscopic spot radiograph. The port was accessed with a Huber needle, aspirated and flushed with heparinized saline. The venotomy site was closed with an interrupted 4-0 Vicryl suture. The port pocket incision was closed with interrupted 2-0 Vicryl suture and the skin was opposed with a running subcuticular 4-0 Vicryl suture. Dermabond and Steri-strips were applied to both incisions. Dressings were placed. The patient tolerated the procedure well without immediate post procedural complication. FINDINGS: After catheter placement, the tip lies within the superior cavoatrial junction. The catheter aspirates and flushes normally and is ready for immediate use. IMPRESSION: Successful placement of a right internal jugular approach power injectable Port-A-Cath. The catheter is ready for immediate use. Electronically Signed   By: Sandi Mariscal M.D.   On: 08/30/2019 16:58   Vas [place] Lower Extremity Venous (dvt)  Result Date: 08/18/2019  Lower Venous Study Indications: Elevated d dimer.  Comparison Study: no prior Performing Technologist: Abram Sander RVS  Examination Guidelines: A complete evaluation includes B-mode imaging, spectral Doppler, color Doppler, and power Doppler as needed of all accessible portions of each vessel. Bilateral testing is considered an integral part of a complete examination. Limited examinations for reoccurring indications may be performed as noted.  +---------+---------------+---------+-----------+----------+--------------+ RIGHT    CompressibilityPhasicitySpontaneityPropertiesThrombus Aging  +---------+---------------+---------+-----------+----------+--------------+ CFV      Full           Yes      Yes                                 +---------+---------------+---------+-----------+----------+--------------+ SFJ      Full                                                        +---------+---------------+---------+-----------+----------+--------------+ FV Prox  Full                                                        +---------+---------------+---------+-----------+----------+--------------+  FV Mid   Full                                                        +---------+---------------+---------+-----------+----------+--------------+ FV DistalFull                                                        +---------+---------------+---------+-----------+----------+--------------+ PFV      Full                                                        +---------+---------------+---------+-----------+----------+--------------+ POP      Full           Yes      Yes                                 +---------+---------------+---------+-----------+----------+--------------+ PTV      Full                                                        +---------+---------------+---------+-----------+----------+--------------+ PERO     Full                                                        +---------+---------------+---------+-----------+----------+--------------+   +---------+---------------+---------+-----------+----------+--------------+ LEFT     CompressibilityPhasicitySpontaneityPropertiesThrombus Aging +---------+---------------+---------+-----------+----------+--------------+ CFV      Full           Yes      Yes                                 +---------+---------------+---------+-----------+----------+--------------+ SFJ      Full                                                         +---------+---------------+---------+-----------+----------+--------------+ FV Prox  Full                                                        +---------+---------------+---------+-----------+----------+--------------+ FV Mid   Full                                                        +---------+---------------+---------+-----------+----------+--------------+  FV DistalFull                                                        +---------+---------------+---------+-----------+----------+--------------+ PFV      Full                                                        +---------+---------------+---------+-----------+----------+--------------+ POP      Full           Yes      Yes                                 +---------+---------------+---------+-----------+----------+--------------+ PTV      Full                                                        +---------+---------------+---------+-----------+----------+--------------+ PERO     Full                                                        +---------+---------------+---------+-----------+----------+--------------+     Summary: Right: There is no evidence of deep vein thrombosis in the lower extremity. No cystic structure found in the popliteal fossa. Left: There is no evidence of deep vein thrombosis in the lower extremity. No cystic structure found in the popliteal fossa.  *See table(s) above for measurements and observations. Electronically signed by Harold Barban MD on 08/18/2019 at 1:53:01 PM.    Final    US Thoracentesis Asp Pleural Space W/img Guide  Result Date: 08/24/2019 INDICATION: Patient with shortness of breath, left pleural effusion. Request made for diagnostic and therapeutic left thoracentesis. EXAM: ULTRASOUND GUIDED DIAGNOSTIC AND THERAPEUTIC LEFT THORACENTESIS MEDICATIONS: 10 mL 1% lidocaine COMPLICATIONS: None immediate. PROCEDURE: An ultrasound guided thoracentesis was thoroughly  discussed with the patient and questions answered. The benefits, risks, alternatives and complications were also discussed. The patient understands and wishes to proceed with the procedure. Written consent was obtained. Ultrasound was performed to localize and mark an adequate pocket of fluid in the left chest. The area was then prepped and draped in the normal sterile fashion. 1% Lidocaine was used for local anesthesia. Under ultrasound guidance a 6 Fr Safe-T-Centesis catheter was introduced. Thoracentesis was performed. The catheter was removed and a dressing applied. FINDINGS: A total of approximately 550 mL of yellow fluid was removed. Samples were sent to the laboratory as requested by the clinical team. IMPRESSION: Successful ultrasound guided left thoracentesis yielding 550 mL of pleural fluid. Read by: Brynda Greathouse PA-C Electronically Signed   By: Jerilynn Mages.  Shick M.D.   On: 08/22/2019 16:59    ASSESSMENT AND PLAN: 1.    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  The patient presented with bulky lymphadenopathy. 2.  Pancytopenia 3.  Lactic acidosis/sepsis,  resolved 4.  Dyspnea secondary to left greater than right pleural effusions 5.  Hyponatremia, resolved 6.  Anxiety 7.  Chronic back pain 8.  Tobacco dependence 9.  Hypercalcemia, resolved  -Right inguinal lymph node biopsy consistent with non-Hodgkin B-cell lymphoma.  Bone marrow biopsy most consistent with chronic lymphocytic leukemia/small lymphocytic lymphoma-fish (B-cell panel) is pending, and pleural fluid showed atypical lymphocytes suspicious for lymphoproliferative process.  Received [facility] 09/01/2019 and again had an infusion reaction to Rituxan despite additional premedications.  He developed hypotension, hypoxia, and rigors.  He also has a faint rash.  He required low-dose Levophed briefly. BP better and tachycardia improved. Improved lymphadenopathy and abdominal distension. Will continue Granix 480 mcg daily.   -The  patient is at high risk for tumor lysis syndrome due to bulky lymphadenopathy.  He has no signs of TLS on his labs this morning.  Continue Allopurinol 300 mg daily. Off IVF.   -The patient presented with pancytopenia likely due to bone marrow involvement of his lymphoma.  Hemoglobin 7.0 today. Receiving 1 unit PRBC today. Platelets  47K today. No transfusion indicated. Transfuse platelets for platelet count <20K.   -He was counseled about smoking cessation.  The patient may discharge to home when otherwise medically stable. He may stop Granix upon discharge. Will arrange for outpatient follow up early next week at the [facility] to check labs and to evaluate for side effects of treatment.   Call Oncology over the weekend for questions.    LOS: 17 days   Mikey Bussing, DNP, AGPCNP-BC, AOCNP 09/03/19

## 2019-09-03 NOTE — Progress Notes (Signed)
PROGRESS NOTE    Isaac Pierce  DGU:[id number] DOB: 01-09-1961 DOA: 08/17/2019 PCP: Patient, No Pcp Per   Brief Narrative:  Patient is a 58 year old male with history of chronic back pain, GERD, anxiety, homelessness who initially presented with shortness of breath.  He complained of generalized weakness, loss of appetite, shortness of breath, productive cough, recent weight loss, leg swelling and distended abdomen when he presented.  He also noticed lumps underneath his axilla and neck.  He has family history of lymphoma.  He was smoking 1 to 2 packs of cigarettes a day.  When he presented he was tachycardic.  Chest x-ray showed mild vascular congestion, small bilateral effusion.  CT angiogram was negative for PE but showed bulky mediastinal, bilateral hilar, bilateral axillary lymphadenopathy.  Findings are concerning for primary lymphoproliferative process such as lymphoma.  Also found to have noncalcified pulmonary nodules measuring up to 10 mm and also hepatosplenomegaly.  He was also anemic on presentation so he was transfused with PRBCs. Total of 5 units of PRBCs during this hospitalization.    Oncology consulted.  Hospital course remarkable for respiratory distress which improved with Lasix.  He underwent biopsy of the right inguinal lymph node and bone marrow biopsy. He is persistently febrile due to lymphoma.  Also underwent thoracentesis for left-sided pleural effusion.  Bone marrow biopsy report showed atypical lymphocytes suspicious for lymphoproliferative disease. Underwent chemo port placement. He had to be moved to ICU  on 09/01/2019 after he did develop anaphylactic shock after he was started on chemotherapy.This was the second episode.  He was started on Levophed in ICU.  Now off Levophed.  Blood pressure is more stable.Oncology following and plan to follow him as an outpatient for starting chemotherapy .  Assessment & Plan:   Principal Problem:   Severe sepsis ([place]) Active Problems:  Malignancy (HCC)   Pancytopenia (HCC)   Dyspnea   Pleural effusion   Hyponatremia   Protein-calorie malnutrition, severe   Non-Hodgkin lymphoma (HCC)   Pressure injury of skin   Shock ([place])   Lymphoproliferative disorder/lymphoma: Presented with 5-day history of dyspnea on exertion, nausea, significant weight loss, diffuse lymphadenopathy.  CT angiogram showed bulky mediastinal, bilateral hilar, bilateral axillary lymphadenopathy.  Also found to have pulmonary nodules, hepatosplenomegaly.  Underwent CT abdomen/pelvis for further staging which showed massive splenomegaly with abdominal and inguinal lymphadenopathy.  Underwent ultrasound-guided liver biopsy by IR of right inguinal lymph node which showed monoclonal CD5 positive B-cell population.  Also underwent CT-guided aspiration and core biopsy of right iliac bone which  showed atypical lymphocytes suspicious for lymphoproliferative disease. Attempted to start chemotherapy here twice,developed anaphylactic reaction, so he stopped. Oncology now planning to start chemotherapy as an outpatient.  Concern for sepsis/lactic acidosis/fever: Lactic acid was elevated on presentation.  Currently improved.  He is persistently febrile from his lymphoma.  Blood cultures have been negative so far.  Antibiotics have been discontinued.  LA improved.  One of the blood culture set showed gram-positive cocci most likely contaminant.  Chest x-ray does not show any clear pneumonia.  Repeat blood cultures are negative.  Persistent sinus tachycardia: Much improved now.  Echocardiogram done here shows normal ejection fraction, no significant other findings.  TSH is elevated.  Low T3, but normal T4.Most likely this is euthyroid sick syndrome.  He was in sinus tachycardia most likely secondary to combination of anxiety, fever.    Pancytopenia: Has leukopenia,anemia,thrombocytopenia.  So far transfused with a total of 6 units of PRBC.  Also has thrombocytopenia.  Will  transfuse with platelets if platelets drop below 20000.  Continue folic acid.  Avoid anticoagulation.  Also started on Granix.  Dyspnea/left-sided pleural effusion: Underwent thoracentesis by IR with removal of 550 mL of yellow fluid.  Currently does not complain of any shortness of breath.  Saturating fine on room air.  Hyponatremia: Stable.Will continue to monitor  Bilateral lower extremity edema/anasarca/concern for DVT: Significantly improved with Lasix.  Anasarca secondary to low albumin from  Malignancy,poor appetite.  D-dimer was elevated at 5.  CT angiogram negative for PE.  Doppler did not show any DVT.On lasix 40 mg IV Echocardiogram showed normal ejection fraction.Will change  lasix to 20 mg oral on discharge if his blood pressure remained stable..  Tobacco abuse: Counseled cessation.  Continue nicotine patch.  GERD: Continue PPI  Elevated liver enzymes: Due to abdominal lymphadenopathy.  Continue to monitor.Muhc improved  Abdominal distention: Secondary to lymphadenopathy, hepatosplenomegaly,ascites.  Denies any abdominal pain today.  Severe protein calorie malnutrition: Dietitian following.        Nutrition Problem: Severe Malnutrition Etiology: chronic illness(concern for lymphoma)      DVT prophylaxis:SCD Code Status: Full Family Communication: None present at the bedside today  disposition Plan: Plan for discharge home with home health in 1 to 2 days if remains hemodynamically stable Consultants: Oncology, PCCM, IR  Procedures: Lymph node biopsy, bone marrow biopsy, thoracentesis  Antimicrobials:  Anti-infectives (From admission, onward)   Start     Dose/Rate Route Frequency Ordered Stop   08/30/19 1500  ceFAZolin (ANCEF) IVPB 2g/100 mL premix     2 g 200 mL/hr over 30 Minutes Intravenous To Radiology 08/30/19 1014 08/30/19 1612   08/18/19 2200  ceFEPIme (MAXIPIME) 2 g in sodium chloride 0.9 % 100 mL IVPB     2 g 200 mL/hr over 30 Minutes Intravenous  Every 8 hours 08/18/19 1534 08/23/19 2255   08/18/19 1200  vancomycin (VANCOCIN) 1,250 mg in sodium chloride 0.9 % 250 mL IVPB  Status:  Discontinued     1,250 mg 166.7 mL/hr over 90 Minutes Intravenous Every 12 hours 08/18/19 0621 08/19/19 1733   08/18/19 0600  piperacillin-tazobactam (ZOSYN) IVPB 3.375 g     3.375 g 12.5 mL/hr over 240 Minutes Intravenous Every 8 hours 08/17/19 2251 08/18/19 1800   08/17/19 2300  vancomycin (VANCOCIN) 1,500 mg in sodium chloride 0.9 % 500 mL IVPB     1,500 mg 250 mL/hr over 120 Minutes Intravenous  Once 08/17/19 2250 08/18/19 0332   08/17/19 2300  piperacillin-tazobactam (ZOSYN) IVPB 3.375 g     3.375 g 100 mL/hr over 30 Minutes Intravenous  Once 08/17/19 2250 08/18/19 0131      Subjective:  Patient seen and examined the bedside this morning.  Looks much better today.  More alert, awake, communicative.  Denies any specific complaints.  His blood pressure is soft.  His hemoglobin dropped to 7 today.  Being transfused with a unit of  PRBC.   Objective: Vitals:   09/03/19 0500 09/03/19 1200 09/03/19 1244 09/03/19 1300  BP:  (!) 81/52 (!) 97/56 97/69  Pulse:  100 (!) 106 (!) 103  Resp:  '18 20 20  ' Temp:  98.2 F (36.8 C) 98.4 F (36.9 C) 98.4 F (36.9 C)  TempSrc:  Oral Oral Oral  SpO2:  98% 97% 97%  Weight: 66.4 kg     Height:        Intake/Output Summary (Last 24 hours) at 09/03/2019 1415 Last data filed at 09/03/2019 1244 Gross per  24 hour  Intake 2023.35 ml  Output 775 ml  Net 1248.35 ml   Filed Weights   08/31/19 0649 09/02/19 0500 09/03/19 0500  Weight: 65.3 kg 67 kg 66.4 kg    Examination:   General exam: Deconditioned, debilitated, thin, chronically ill looking HEENT:PERRL,Oral mucosa moist, Ear/Nose normal on gross exam Respiratory system: Mild Decreased air entry on the left side. Cardiovascular system: S1 & S2 heard, RRR. No JVD, murmurs, rubs, gallops or clicks.Chemo port on the right chest Gastrointestinal system:  Abdomen is distended, soft and nontender. . Normal bowel sounds heard. Central nervous system: Alert and oriented. No focal neurological deficits. Extremities:No  edema, no clubbing ,no cyanosis Skin: No rashes, lesions or ulcers,no icterus ,no pallor     Data Reviewed: I have personally reviewed following labs and imaging studies  CBC: Recent Labs  Lab 08/30/19 0424 08/30/19 1037 08/31/19 0408 09/01/19 0419 09/02/19 0533 09/03/19 0629  WBC 4.5  --  3.7* 3.1* 3.6* 3.4*  NEUTROABS  --   --  1.0* 0.9* 1.9 2.8  HGB 7.3* 7.5* 7.7* 7.6* 7.7* 7.0*  HCT 22.5* 22.7* 23.6* 22.9* 23.4* 22.1*  MCV 89.3  --  88.4 87.7 87.6 90.2  PLT 53*  --  55* 51* 47* 47*   Basic Metabolic Panel: Recent Labs  Lab 08/29/19 0632 08/30/19 0424 09/01/19 0419 09/02/19 0533 09/03/19 0629  NA 130* 133* 129* 134* 135  K 3.8 4.1 4.0 3.9 4.1  CL 95* 99 94* 97* 100  CO2 '26 23 25 23 ' 20*  GLUCOSE 85 113* 84 133* 163*  BUN 21* 23* 21* 28* 34*  CREATININE 0.66 0.69 0.56* 0.67 0.77  CALCIUM 9.9 10.4* 9.6 9.2 9.0   GFR: Estimated Creatinine Clearance: 96.8 mL/min (by C-G formula based on SCr of 0.77 mg/dL). Liver Function Tests: Recent Labs  Lab 08/29/19 0632 08/30/19 0424 09/01/19 0419 09/02/19 0533 09/03/19 0629  AST 72* 63* 58* 53* 41  ALT 41 37 '31 27 25  ' ALKPHOS 259* 282* 213* 182* 146*  BILITOT 1.0 0.9 1.0 1.0 1.0  PROT 4.2* 4.2* 4.2* 4.6* 4.2*  ALBUMIN 1.8* 1.7* 1.8* 2.3* 2.0*   No results for input(s): LIPASE, AMYLASE in the last 168 hours. No results for input(s): AMMONIA in the last 168 hours. Coagulation Profile: Recent Labs  Lab 08/30/19 1037  INR 1.2   Cardiac Enzymes: No results for input(s): CKTOTAL, CKMB, CKMBINDEX, TROPONINI in the last 168 hours. BNP (last 3 results) No results for input(s): PROBNP in the last 8760 hours. HbA1C: No results for input(s): HGBA1C in the last 72 hours. CBG: No results for input(s): GLUCAP in the last 168 hours. Lipid Profile: No results  for input(s): CHOL, HDL, LDLCALC, TRIG, CHOLHDL, LDLDIRECT in the last 72 hours. Thyroid Function Tests: No results for input(s): TSH, T4TOTAL, FREET4, T3FREE, THYROIDAB in the last 72 hours. Anemia Panel: No results for input(s): VITAMINB12, FOLATE, FERRITIN, TIBC, IRON, RETICCTPCT in the last 72 hours. Sepsis Labs: Recent Labs  Lab 09/01/19 2106  LATICACIDVEN 2.7*    Recent Results (from the past 240 hour(s))  MRSA PCR Screening     Status: None   Collection Time: 08/25/19 10:03 PM   Specimen: Nasal Mucosa; Nasopharyngeal  Result Value Ref Range Status   MRSA by PCR NEGATIVE NEGATIVE Final    Comment:        The GeneXpert MRSA Assay (FDA approved for NASAL specimens only), is one component of a comprehensive MRSA colonization surveillance program. It is not intended to  diagnose MRSA infection nor to guide or monitor treatment for MRSA infections. Performed at [facility], [place] [address] [place], [place] [address]   Culture, blood (routine x 2)     Status: None   Collection Time: 08/26/19  7:49 AM   Specimen: BLOOD RIGHT ARM  Result Value Ref Range Status   Specimen Description   Final    BLOOD RIGHT ARM Performed at [facility] [address] [place], [place] [address]    Special Requests   Final    BOTTLES DRAWN AEROBIC AND ANAEROBIC Blood Culture adequate volume Performed at [facility] [address] [place], [place] [address]    Culture   Final    NO GROWTH 5 DAYS Performed at [facility], [place] [address] [place], [place] [address]    Report Status 08/31/2019 FINAL  Final  Culture, blood (routine x 2)     Status: None   Collection Time: 08/26/19  7:49 AM   Specimen: BLOOD RIGHT ARM  Result Value Ref Range Status   Specimen Description   Final    BLOOD RIGHT ARM Performed at [facility] [address], [place], [place] [address]    Special Requests   Final     BOTTLES DRAWN AEROBIC ONLY Blood Culture adequate volume Performed at [facility] [address], [place], [place] [address]    Culture   Final    NO GROWTH 5 DAYS Performed at [facility], [place] [address] [place], [place] [address]    Report Status 08/31/2019 FINAL  Final  Culture, blood (routine x 2)     Status: None (Preliminary result)   Collection Time: 09/01/19  9:06 PM   Specimen: BLOOD  Result Value Ref Range Status   Specimen Description   Final    BLOOD BLOOD LEFT ARM Performed at [facility] [address], [place], [place] [address]    Special Requests   Final    BOTTLES DRAWN AEROBIC ONLY Blood Culture results may not be optimal due to an inadequate volume of blood received in culture bottles Performed at [facility] [address] [place], [place] [address]    Culture   Final    NO GROWTH 1 DAY Performed at [facility], [place] [address] [place], [place] [address]    Report Status PENDING  Incomplete  Culture, blood (routine x 2)     Status: None (Preliminary result)   Collection Time: 09/01/19  9:06 PM   Specimen: BLOOD  Result Value Ref Range Status   Specimen Description   Final    BLOOD BLOOD RIGHT ARM Performed at [facility] [address] [place], [place] [address]    Special Requests   Final    BOTTLES DRAWN AEROBIC AND ANAEROBIC Blood Culture adequate volume Performed at [facility] [address], [place], [place] [address]    Culture   Final    NO GROWTH 1 DAY Performed at [facility], [place] [address], [place], [place] [address]    Report Status PENDING  Incomplete         Radiology Studies: Dg Chest 1 View  Result Date: 09/01/2019 CLINICAL DATA:  Shortness of breath. Oxygen desaturation. Chest pain. EXAM: CHEST  1 VIEW COMPARISON:  Radiograph 08/23/2019, CT 08/25/2019 FINDINGS: Patient is rotated. Right chest port  with tip in the lower SVC.  Increase in bilateral pleural effusions from prior exam. Cardiomegaly which may have increased versus rotation. Increased interstitial thickening suspicious for pulmonary edema. No visualized pneumothorax. Nodularity in thoracic adenopathy seen on chest CT, not well demonstrated radiographically. IMPRESSION: 1. Increase in bilateral pleural effusions. Increased interstitial thickening consistent with pulmonary edema. Overall findings suggest increased fluid overload. 2. Rotated exam. Cardiomegaly with questionable progression versus rotation. Electronically Signed   By: Keith Rake M.D.   On: 09/01/2019 17:58        Scheduled Meds: . sodium chloride   Intravenous Once  . allopurinol  300 mg Oral Daily  . Chlorhexidine Gluconate Cloth  6 each Topical Daily  . feeding supplement (ENSURE ENLIVE)  237 mL Oral BID BM  . feeding supplement (PRO-STAT SUGAR FREE 64)  30 mL Oral BID  . folic acid  1 mg Oral Daily  . hydrocortisone   Rectal TID  . mouth rinse  15 mL Mouth Rinse BID  . multivitamin with minerals  1 tablet Oral Daily  . nicotine  7 mg Transdermal Daily  . nystatin  5 mL Mouth/Throat QID  . pantoprazole  20 mg Oral Daily  . sodium chloride flush  10-40 mL Intracatheter Q12H  . Tbo-filgastrim (GRANIX) SQ  480 mcg Subcutaneous q1800   Continuous Infusions: . sodium chloride       LOS: 17 days    Time spent: 35 mins.More than 50% of that time was spent in counseling and/or coordination of care.      Shelly Coss, MD [facility] Pager [phone]  If 7PM-7AM, please contact night-coverage [email or web address] Password [email or web address] 09/03/2019, 2:15 PM

## 2019-09-04 LAB — CBC WITH DIFFERENTIAL/PLATELET
Abs Immature Granulocytes: 0.07 10*3/uL (ref 0.00–0.07)
Basophils Absolute: 0 10*3/uL (ref 0.0–0.1)
Basophils Relative: 0 %
Eosinophils Absolute: 0 10*3/uL (ref 0.0–0.5)
Eosinophils Relative: 0 %
HCT: 20.3 % — ABNORMAL LOW (ref 39.0–52.0)
Hemoglobin: 6.5 g/dL — CL (ref 13.0–17.0)
Immature Granulocytes: 4 %
Lymphocytes Relative: 16 %
Lymphs Abs: 0.3 10*3/uL — ABNORMAL LOW (ref 0.7–4.0)
MCH: 28.5 pg (ref 26.0–34.0)
MCHC: 32 g/dL (ref 30.0–36.0)
MCV: 89 fL (ref 80.0–100.0)
Monocytes Absolute: 0.1 10*3/uL (ref 0.1–1.0)
Monocytes Relative: 4 %
Neutro Abs: 1.4 10*3/uL — ABNORMAL LOW (ref 1.7–7.7)
Neutrophils Relative %: 76 %
Platelets: 47 10*3/uL — ABNORMAL LOW (ref 150–400)
RBC: 2.28 MIL/uL — ABNORMAL LOW (ref 4.22–5.81)
RDW: 19.4 % — ABNORMAL HIGH (ref 11.5–15.5)
WBC: 1.9 10*3/uL — ABNORMAL LOW (ref 4.0–10.5)
nRBC: 0 % (ref 0.0–0.2)

## 2019-09-04 LAB — PREPARE RBC (CROSSMATCH)

## 2019-09-04 MED ORDER — FUROSEMIDE 10 MG/ML IJ SOLN
20.0000 mg | Freq: Once | INTRAMUSCULAR | Status: AC
  Administered 2019-09-04: 20 mg via INTRAVENOUS
  Filled 2019-09-04: qty 2

## 2019-09-04 MED ORDER — SODIUM CHLORIDE 0.9% IV SOLUTION
Freq: Once | INTRAVENOUS | Status: AC
  Administered 2019-09-04: 10:00:00 via INTRAVENOUS

## 2019-09-04 NOTE — Progress Notes (Signed)
CRITICAL VALUE ALERT  Critical Value:  Hgb = 6.5  Date & Time Notied:  @6 :45am 09/04/19  Provider Notified:Amion text sent   Orders Received/Actions taken: pending

## 2019-09-04 NOTE — Progress Notes (Signed)
Pt has an increase in respirations and his MEWS score is in the RED. Notified charge RN and administered a Xopenex nebulizer solution. Pt reports a decrease in shortness of breaths but his respirations continue to remain between 24-28. Pt also has increased abdominal distention. Notified MD and Rapid response. MD stated to continue to monitor the pt. Rapid Response stated to continue to monitor and notify if the pt continues to not improve. RED MEWS initiated.

## 2019-09-04 NOTE — Progress Notes (Signed)
PT Cancellation Note  Patient Details Name: Isaac Pierce MRN: [id number] DOB: Jul 07, 1961   Cancelled Treatment:    Reason Eval/Treat Not Completed: Medical issues which prohibited therapy. Per RN, pt awaiting 1RBC due to worsening hemoglobin and WBC count values. RN requests to hold therapy until after pt receives blood.    Talbot Grumbling PT, DPT 09/04/19, 9:14 AM [phone]

## 2019-09-04 NOTE — Progress Notes (Signed)
PROGRESS NOTE    Isaac Pierce  SAY:[id number] DOB: [birth year] DOA: 08/17/2019 PCP: Patient, No Pcp Per   Brief Narrative:  Patient is a 58 year old male with history of chronic back pain, GERD, anxiety, homelessness who initially presented with shortness of breath.  He complained of generalized weakness, loss of appetite, shortness of breath, productive cough, recent weight loss, leg swelling and distended abdomen when he presented.  He also noticed lumps underneath his axilla and neck.  He has family history of lymphoma.  He was smoking 1 to 2 packs of cigarettes a day.  When he presented he was tachycardic.  Chest x-ray showed mild vascular congestion, small bilateral effusion.  CT angiogram was negative for PE but showed bulky mediastinal, bilateral hilar, bilateral axillary lymphadenopathy.  Findings are concerning for primary lymphoproliferative process such as lymphoma.  Also found to have noncalcified pulmonary nodules measuring up to 10 mm and also hepatosplenomegaly.  He was also anemic on presentation so he was transfused with PRBCs. Total of 5 units of PRBCs during this hospitalization.    Oncology consulted.  Hospital course remarkable for respiratory distress which improved with Lasix.  He underwent biopsy of the right inguinal lymph node and bone marrow biopsy. He is persistently febrile due to lymphoma.  Also underwent thoracentesis for left-sided pleural effusion.  Bone marrow biopsy report showed atypical lymphocytes suspicious for lymphoproliferative disease. Underwent chemo port placement. He had to be moved to ICU  on 09/01/2019 after he did develop anaphylactic shock after he was started on chemotherapy.This was the second episode.  He was started on Levophed in ICU.  Now off Levophed.  Blood pressure is more stable.Oncology following and plan to follow him as an outpatient for starting chemotherapy . Being transfused with a unit of PRBC today.  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  CT angiogram showed bulky mediastinal, bilateral hilar, bilateral axillary lymphadenopathy.  Also found to have pulmonary nodules, hepatosplenomegaly.  Underwent CT abdomen/pelvis for further staging which showed massive splenomegaly with abdominal and inguinal lymphadenopathy.  Underwent ultrasound-guided liver biopsy by IR of right inguinal lymph node which showed monoclonal CD5 positive B-cell population.  Also underwent CT-guided aspiration and core biopsy of right iliac bone which  showed atypical lymphocytes suspicious for lymphoproliferative disease. Attempted to start chemotherapy here twice,developed anaphylactic reaction, so he stopped. Oncology now planning to start chemotherapy as an outpatient.  Concern for sepsis/lactic acidosis/fever: Lactic acid was elevated on presentation.  Currently improved.  He is persistently febrile from his lymphoma.  Blood cultures have been negative so far.  Antibiotics have been discontinued.  LA improved.  One of the blood culture set showed gram-positive cocci most likely contaminant.  Chest x-ray does not show any clear pneumonia.  Repeat blood cultures are negative.  Persistent sinus tachycardia: Much improved now.  Echocardiogram done here shows normal ejection fraction, no significant other findings.  TSH is elevated.  Low T3, but normal T4.Most likely this is euthyroid sick syndrome.  He was in sinus tachycardia most likely secondary to combination of anxiety,  fever.    Pancytopenia: Hb dropped  to 6 today. He is being transfused with a unit of PRBC. Has leukopenia,anemia,thrombocytopenia.  So far transfused with a total of 7 units of PRBC.  Also has thrombocytopenia.  Will transfuse with platelets if platelets drop below 20000.  Continue folic acid.  Avoid anticoagulation.  Also started on Granix.  Dyspnea/left-sided pleural effusion: Underwent thoracentesis by IR with removal of 550 mL of yellow fluid.  Currently does not complain of any shortness of breath.  Saturating fine on room air.  Hyponatremia: Stable.Will continue to monitor  Bilateral lower extremity edema/anasarca/concern for DVT: Significantly improved with Lasix.  Anasarca secondary to low albumin from  Malignancy,poor appetite.  D-dimer was elevated at 5.  CT angiogram negative for PE.  Doppler did not show any DVT.On lasix 40 mg IV Echocardiogram showed normal ejection fraction.Will change  lasix to 20 mg oral on discharge if his blood pressure remained stable..  Tobacco abuse: Counseled cessation.  Continue nicotine patch.  GERD: Continue PPI  Elevated liver enzymes: Due to abdominal lymphadenopathy.  Continue to monitor.Muhc improved  Abdominal distention: Secondary to lymphadenopathy, hepatosplenomegaly,ascites.  Denies any abdominal pain today.  Severe protein calorie malnutrition: Dietitian following.        Nutrition Problem: Severe Malnutrition Etiology: chronic illness(concern for lymphoma)      DVT prophylaxis:SCD Code Status: Full Family Communication: None present at the bedside today  disposition Plan: Plan for discharge home with home health in 1 to 2 days if remains hemodynamically stable Consultants: Oncology, PCCM, IR  Procedures: Lymph node biopsy, bone marrow biopsy, thoracentesis  Antimicrobials:  Anti-infectives (From admission, onward)   Start     Dose/Rate Route Frequency Ordered Stop   08/30/19 1500  ceFAZolin (ANCEF) IVPB 2g/100 mL premix     2 g 200 mL/hr over 30 Minutes  Intravenous To Radiology 08/30/19 1014 08/30/19 1612   08/18/19 2200  ceFEPIme (MAXIPIME) 2 g in sodium chloride 0.9 % 100 mL IVPB     2 g 200 mL/hr over 30 Minutes Intravenous Every 8 hours 08/18/19 1534 08/23/19 2255   08/18/19 1200  vancomycin (VANCOCIN) 1,250 mg in sodium chloride 0.9 % 250 mL IVPB  Status:  Discontinued     1,250 mg 166.7 mL/hr over 90 Minutes Intravenous Every 12 hours 08/18/19 0621 08/19/19 1733   08/18/19 0600  piperacillin-tazobactam (ZOSYN) IVPB 3.375 g     3.375 g 12.5 mL/hr over 240 Minutes Intravenous Every 8 hours 08/17/19 2251 08/18/19 1800   08/17/19 2300  vancomycin (VANCOCIN) 1,500 mg in sodium chloride 0.9 % 500 mL IVPB     1,500 mg 250 mL/hr over 120 Minutes Intravenous  Once 08/17/19 2250 08/18/19 0332   08/17/19 2300  piperacillin-tazobactam (ZOSYN) IVPB 3.375 g     3.375 g 100 mL/hr over 30 Minutes Intravenous  Once 08/17/19 2250 08/18/19 0131      Subjective:  Patient seen and examined the bedside this morning.  Hemodynamically stable.  Hemoglobin dropped to 6 so he has been transfused with a unit of PRBC.  Feels comfortable.  Rapid response was called later this morning after he developed became tachypneic.  Currently hemodynamically stable.   Objective: Vitals:   09/04/19 1334 09/04/19 1346 09/04/19 1415 09/04/19 1445  BP: 110/69 112/79 109/72 106/63  Pulse: (!) 111 (!) 109 (!) 102 100  Resp: 20 (!) 22 (!) 21 20  Temp: 99.4 F (37.4 C) 99.1 F (37.3 C) 98.7 F (37.1 C) 98.8 F (37.1 C)  TempSrc: Oral Oral Oral Oral  SpO2: 98% 98% 99% 100%  Weight:      Height:        Intake/Output Summary (Last 24 hours) at 09/04/2019 1526 Last data filed at 09/04/2019 1304 Gross per 24 hour  Intake 1555 ml  Output 1350 ml  Net 205 ml   Filed Weights   09/02/19 0500 09/03/19 0500 09/04/19 0500  Weight: 67 kg 66.4 kg 66.7 kg    Examination:   General exam: Deconditioned, debilitated, thin, chronically ill looking HEENT:PERRL,Oral mucosa  moist, Ear/Nose normal on gross exam Respiratory system: Decreased air entry on the left side. Cardiovascular system: S1 & S2 heard, RRR. No JVD, murmurs, rubs, gallops or clicks.Chemo port on the right chest Gastrointestinal system: Abdomen is distended, soft and nontender. . Normal bowel sounds heard. Central nervous system: Alert and oriented. No focal neurological deficits. Extremities:No  edema, no clubbing ,no cyanosis Skin: No rashes, lesions or ulcers,no icterus ,no pallor     Data Reviewed: I have personally reviewed following labs and imaging studies  CBC: Recent Labs  Lab 08/31/19 0408 09/01/19 0419 09/02/19 0533 09/03/19 0629 09/04/19 0457  WBC 3.7* 3.1* 3.6* 3.4* 1.9*  NEUTROABS 1.0* 0.9* 1.9 2.8 1.4*  HGB 7.7* 7.6* 7.7* 7.0* 6.5*  HCT 23.6* 22.9* 23.4* 22.1* 20.3*  MCV 88.4 87.7 87.6 90.2 89.0  PLT 55* 51* 47* 47* 47*   Basic Metabolic Panel: Recent Labs  Lab 08/29/19 0632 08/30/19 0424 09/01/19 0419 09/02/19 0533 09/03/19 0629  NA 130* 133* 129* 134* 135  K 3.8 4.1 4.0 3.9 4.1  CL 95* 99 94* 97* 100  CO2 '26 23 25 23 ' 20*  GLUCOSE 85 113* 84 133* 163*  BUN 21* 23* 21* 28* 34*  CREATININE 0.66 0.69 0.56* 0.67 0.77  CALCIUM 9.9 10.4* 9.6 9.2 9.0   GFR: Estimated Creatinine Clearance: 97.3 mL/min (by C-G formula based on SCr of 0.77 mg/dL). Liver Function Tests: Recent Labs  Lab 08/29/19 0632 08/30/19 0424 09/01/19 0419 09/02/19 0533 09/03/19 0629  AST 72* 63* 58* 53* 41  ALT 41 37 '31 27 25  ' ALKPHOS 259* 282* 213* 182* 146*  BILITOT 1.0 0.9 1.0 1.0 1.0  PROT 4.2* 4.2* 4.2* 4.6* 4.2*  ALBUMIN 1.8* 1.7* 1.8* 2.3* 2.0*   No results for input(s): LIPASE, AMYLASE in the last 168 hours. No results for input(s): AMMONIA in the last 168 hours. Coagulation Profile: Recent Labs  Lab 08/30/19 1037  INR 1.2   Cardiac Enzymes: No results for input(s): CKTOTAL, CKMB, CKMBINDEX, TROPONINI in the last 168 hours. BNP (last 3 results) No results for  input(s): PROBNP in the last 8760 hours. HbA1C: No results for input(s): HGBA1C in the last 72 hours. CBG: No results for input(s): GLUCAP in the last 168 hours. Lipid Profile: No results for input(s): CHOL, HDL, LDLCALC, TRIG, CHOLHDL, LDLDIRECT in the last 72 hours. Thyroid Function Tests: No results for input(s): TSH, T4TOTAL, FREET4, T3FREE, THYROIDAB in the last 72 hours. Anemia Panel: No results for input(s): VITAMINB12, FOLATE, FERRITIN, TIBC, IRON, RETICCTPCT in the last 72 hours. Sepsis Labs: Recent Labs  Lab 09/01/19 2106  LATICACIDVEN 2.7*    Recent Results (from the past 240 hour(s))  MRSA PCR Screening     Status: None   Collection Time: 08/25/19 10:03 PM   Specimen: Nasal Mucosa; Nasopharyngeal  Result Value Ref Range Status   MRSA by PCR NEGATIVE NEGATIVE Final    Comment:        The GeneXpert  MRSA Assay (FDA approved for NASAL specimens only), is one component of a comprehensive MRSA colonization surveillance program. It is not intended to diagnose MRSA infection nor to guide or monitor treatment for MRSA infections. Performed at [facility], [place] [address], [place], [place] [address]   Culture, blood (routine x 2)     Status: None   Collection Time: 08/26/19  7:49 AM   Specimen: BLOOD RIGHT ARM  Result Value Ref Range Status   Specimen Description   Final    BLOOD RIGHT ARM Performed at [facility] [address], [place], [place] [address]    Special Requests   Final    BOTTLES DRAWN AEROBIC AND ANAEROBIC Blood Culture adequate volume Performed at [facility] [address], [place], [place] [address]    Culture   Final    NO GROWTH 5 DAYS Performed at [facility], [place] [address], [place], [place] [address]    Report Status 08/31/2019 FINAL  Final  Culture, blood (routine x 2)     Status: None   Collection Time: 08/26/19  7:49 AM   Specimen: BLOOD RIGHT ARM  Result Value Ref  Range Status   Specimen Description   Final    BLOOD RIGHT ARM Performed at [facility] [address] [place], [place] [address]    Special Requests   Final    BOTTLES DRAWN AEROBIC ONLY Blood Culture adequate volume Performed at [place] [facility] [address], [place], [place] [address]    Culture   Final    NO GROWTH 5 DAYS Performed at [facility], [place] [address], [place], [place] [address]    Report Status 08/31/2019 FINAL  Final  Culture, blood (routine x 2)     Status: None (Preliminary result)   Collection Time: 09/01/19  9:06 PM   Specimen: BLOOD LEFT ARM  Result Value Ref Range Status   Specimen Description   Final    BLOOD LEFT ARM Performed at [facility], [place] [address], [place], [place] [address]    Special Requests   Final    BOTTLES DRAWN AEROBIC ONLY Blood Culture results may not be optimal due to an inadequate volume of blood received in culture bottles Performed at [facility] [address], [place], [place] [address]    Culture   Final    NO GROWTH 2 DAYS Performed at [facility] [address] [place], [place] [address]    Report Status PENDING  Incomplete  Culture, blood (routine x 2)     Status: None (Preliminary result)   Collection Time: 09/01/19  9:06 PM   Specimen: BLOOD RIGHT ARM  Result Value Ref Range Status   Specimen Description   Final    BLOOD RIGHT ARM Performed at [facility], [place] [address], [place], [place] [address]    Special Requests   Final    BOTTLES DRAWN AEROBIC AND ANAEROBIC Blood Culture adequate volume Performed at [facility] [address] [place], [place] [address]    Culture   Final    NO GROWTH 2 DAYS Performed at [facility] [address] [place], [place] [address]    Report Status PENDING  Incomplete         Radiology Studies: No results found.      Scheduled Meds: . allopurinol   300 mg Oral Daily  .  Chlorhexidine Gluconate Cloth  6 each Topical Daily  . feeding supplement (ENSURE ENLIVE)  237 mL Oral BID BM  . feeding supplement (PRO-STAT SUGAR FREE 64)  30 mL Oral BID  . folic acid  1 mg Oral Daily  . hydrocortisone   Rectal TID  . mouth rinse  15 mL Mouth Rinse BID  . multivitamin with minerals  1 tablet Oral Daily  . nicotine  7 mg Transdermal Daily  . nystatin  5 mL Mouth/Throat QID  . pantoprazole  20 mg Oral Daily  . Tbo-filgastrim (GRANIX) SQ  480 mcg Subcutaneous q1800   Continuous Infusions: . sodium chloride       LOS: 18 days    Time spent: 35 mins.More than 50% of that time was spent in counseling and/or coordination of care.      Shelly Coss, MD [facility] Pager [phone]  If 7PM-7AM, please contact night-coverage [email or web address] Password [email or web address] 09/04/2019, 3:26 PM

## 2019-09-04 NOTE — Significant Event (Addendum)
Rapid Response Event Note  Overview: Time Called: 1132 Arrival Time: [id number]  Event Type: Respiratory, Other (Comment), MEWS(Increased Respirations) Notified by [facility] Charge RN, Shanon Brow, in regards to patient MEWS score being in the RED due to increase respirations. Upon arrival, patient was ordering a few tray per phone. Patient did appear to have an increase respiratory rate but controlled. Patient did say breathing was better after receiving xopenex at 1119.   Initial Focused Assessment: Neuro: Alert and oriented x4, able to follow simple commands. No fever  Cardiac: ST 116, BP slightly hypotensive 98/42 (58)-patient receiving blood.  Pulmonary: RR 20-22, O2 Sat 96% on RA, patient not in respiratory distress. Breath sounds are clear in upper fields, slightly diminished on left upper in comparison to right but no rhonchi-patient did feel that the xopenex treatment did help breathing to be less tight or able to get a deeper breath. Breath sounds in lower lung fields are diminished per auscultation.  GI: Abdomen distended, patient did state that he had a BM yesterday, per Kasia RN distention has increased from yesterday. Patient did state, " abdomen distension has affected my breathing some ". Bowel sounds faint per auscultation. Patient is having no abdominal pain at this time.   Interventions: Continue to administer blood at slow rate  MD notified in regards to distention-monitor distension for now.   Plan of Care (if not transferred): Continue to monitor patient on telemetry, continue to follow vital signs per MEWS protocol. If patient's respirations continue to increase or appears to be going into to respiratory distress please call Rapid Response. If patient has a clinical status change or MEWS continues to increase please call Rapid Response.   Event Summary: MD Notified by Thompson Caul RN at [place]

## 2019-09-05 LAB — CBC WITH DIFFERENTIAL/PLATELET
Abs Immature Granulocytes: 0 10*3/uL (ref 0.00–0.07)
Basophils Absolute: 0 10*3/uL (ref 0.0–0.1)
Basophils Relative: 0 %
Eosinophils Absolute: 0 10*3/uL (ref 0.0–0.5)
Eosinophils Relative: 0 %
HCT: 21.1 % — ABNORMAL LOW (ref 39.0–52.0)
Hemoglobin: 6.9 g/dL — CL (ref 13.0–17.0)
Lymphocytes Relative: 10 %
Lymphs Abs: 0.2 10*3/uL — ABNORMAL LOW (ref 0.7–4.0)
MCH: 29.4 pg (ref 26.0–34.0)
MCHC: 32.7 g/dL (ref 30.0–36.0)
MCV: 89.8 fL (ref 80.0–100.0)
Monocytes Absolute: 0 10*3/uL — ABNORMAL LOW (ref 0.1–1.0)
Monocytes Relative: 0 %
Neutro Abs: 1.6 10*3/uL — ABNORMAL LOW (ref 1.7–7.7)
Neutrophils Relative %: 90 %
Platelets: 36 10*3/uL — ABNORMAL LOW (ref 150–400)
RBC: 2.35 MIL/uL — ABNORMAL LOW (ref 4.22–5.81)
RDW: 18.7 % — ABNORMAL HIGH (ref 11.5–15.5)
WBC: 1.8 10*3/uL — ABNORMAL LOW (ref 4.0–10.5)
nRBC: 0 % (ref 0.0–0.2)

## 2019-09-05 LAB — PREPARE RBC (CROSSMATCH)

## 2019-09-05 MED ORDER — FUROSEMIDE 20 MG PO TABS
20.0000 mg | ORAL_TABLET | Freq: Every day | ORAL | Status: DC
  Administered 2019-09-05 – 2019-09-06 (×2): 20 mg via ORAL
  Filled 2019-09-05 (×2): qty 1

## 2019-09-05 MED ORDER — SODIUM CHLORIDE 0.9% IV SOLUTION: Freq: Once | INTRAVENOUS | Status: DC

## 2019-09-05 NOTE — Progress Notes (Signed)
Pt. States that he did not want a breathing treatment he wants an ativan spray that helps with his breathing.  We had discussion regarding this and the patient stated that he just wants one or two breaths from that "thing" on the wall.  Pt. Given HHN treatment. BBS clear, no distress.

## 2019-09-05 NOTE — Progress Notes (Signed)
PROGRESS NOTE    Isaac Pierce  WLN:[id number] DOB: September 24, 1961 DOA: 08/17/2019 PCP: Patient, No Pcp Per   Brief Narrative:  Patient is a 58 year old male with history of chronic back pain, GERD, anxiety, homelessness who initially presented with shortness of breath.  He complained of generalized weakness, loss of appetite, shortness of breath, productive cough, recent weight loss, leg swelling and distended abdomen when he presented.  He also noticed lumps underneath his axilla and neck.  He has family history of lymphoma.  He was smoking 1 to 2 packs of cigarettes a day.  When he presented he was tachycardic.  Chest x-ray showed mild vascular congestion, small bilateral effusion.  CT angiogram was negative for PE but showed bulky mediastinal, bilateral hilar, bilateral axillary lymphadenopathy.  Findings are concerning for primary lymphoproliferative process such as lymphoma.  Also found to have noncalcified pulmonary nodules measuring up to 10 mm and also hepatosplenomegaly.  He was also anemic on presentation so he was transfused with PRBCs. Total of 5 units of PRBCs during this hospitalization.    Oncology consulted.  Hospital course remarkable for respiratory distress which improved with Lasix.  He underwent biopsy of the right inguinal lymph node and bone marrow biopsy. He is persistently febrile due to lymphoma.  Also underwent thoracentesis for left-sided pleural effusion.  Bone marrow biopsy report showed atypical lymphocytes suspicious for lymphoproliferative disease. Underwent chemo port placement. He had to be moved to ICU  on 09/01/2019 after he did develop anaphylactic shock after he was started on chemotherapy.This was the second episode.  He was started on Levophed in ICU.  Now off Levophed.  Blood pressure is more stable.Oncology following and plan to follow him as an outpatient for starting chemotherapy . Being transfused with 2 units of PRBC today.  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  CT angiogram showed bulky mediastinal, bilateral hilar, bilateral axillary lymphadenopathy.  Also found to have pulmonary nodules, hepatosplenomegaly.  Underwent CT abdomen/pelvis for further staging which showed massive splenomegaly with abdominal and inguinal lymphadenopathy.  Underwent ultrasound-guided liver biopsy by IR of right inguinal lymph node which showed monoclonal CD5 positive B-cell population.  Also underwent CT-guided aspiration and core biopsy of right iliac bone which  showed atypical lymphocytes suspicious for lymphoproliferative disease. Attempted to start chemotherapy here twice,developed anaphylactic reaction, so he stopped. Oncology now planning to start chemotherapy as an outpatient.  Concern for sepsis/lactic acidosis/fever: Lactic acid was elevated on presentation.  Currently improved.  He is persistently febrile from his lymphoma.  Blood cultures have been negative so far.  Antibiotics have been discontinued.  LA improved.  One of the blood culture set showed gram-positive cocci most likely contaminant.  Chest x-ray does not show any clear pneumonia.  Repeat blood cultures are negative.  Persistent sinus tachycardia: Much improved now.  Echocardiogram done here shows normal ejection fraction, no significant other findings.  TSH is elevated.  Low T3, but normal T4.Most likely this is euthyroid sick syndrome.   Pancytopenia: Hb again  dropped to 6 today. He is being  transfused with 2 units of PRBC. Has  leukopenia,anemia,thrombocytopenia.  So far transfused with a total of 9 units of PRBC.  Also has thrombocytopenia.  Will transfuse with platelets if platelets drop below 20000.  Continue folic acid.  Avoid anticoagulation.  Also started on Granix.  Dyspnea/left-sided pleural effusion: Underwent thoracentesis by IR with removal of 550 mL of yellow fluid.  Currently does not complain of any shortness of breath.  Saturating fine on room air.  Hyponatremia: Stable.Will continue to monitor  Bilateral lower extremity edema/anasarca/concern for DVT: Significantly improved with Lasix.  Anasarca secondary to low albumin from  Malignancy,poor appetite.  D-dimer was elevated at 5.  CT angiogram negative for PE.  Doppler did not show any DVT.Echocardiogram showed normal ejection fraction.Will continue  lasix to 20 mg oral .  Tobacco abuse: Counseled cessation.  Continue nicotine patch.  GERD: Continue PPI  Elevated liver enzymes: Due to abdominal lymphadenopathy.  Continue to monitor.Muhc improved  Abdominal distention: Secondary to lymphadenopathy, hepatosplenomegaly,ascites.  Denies any abdominal pain today.  Severe protein calorie malnutrition: Dietitian following.        Nutrition Problem: Severe Malnutrition Etiology: chronic illness(concern for lymphoma)      DVT prophylaxis:SCD Code Status: Full Family Communication: None present at the bedside today  disposition Plan: Plan for discharge home with home health  Tomorrow if CBC remains stable  Consultants: Oncology, PCCM, IR  Procedures: Lymph node biopsy, bone marrow biopsy, thoracentesis  Antimicrobials:  Anti-infectives (From admission, onward)   Start     Dose/Rate Route Frequency Ordered Stop   08/30/19 1500  ceFAZolin (ANCEF) IVPB 2g/100 mL premix     2 g 200 mL/hr over 30 Minutes Intravenous To Radiology 08/30/19 1014 08/30/19 1612   08/18/19 2200  ceFEPIme (MAXIPIME) 2 g in sodium chloride 0.9 % 100 mL IVPB     2 g 200  mL/hr over 30 Minutes Intravenous Every 8 hours 08/18/19 1534 08/23/19 2255   08/18/19 1200  vancomycin (VANCOCIN) 1,250 mg in sodium chloride 0.9 % 250 mL IVPB  Status:  Discontinued     1,250 mg 166.7 mL/hr over 90 Minutes Intravenous Every 12 hours 08/18/19 0621 08/19/19 1733   08/18/19 0600  piperacillin-tazobactam (ZOSYN) IVPB 3.375 g     3.375 g 12.5 mL/hr over 240 Minutes Intravenous Every 8 hours 08/17/19 2251 08/18/19 1800   08/17/19 2300  vancomycin (VANCOCIN) 1,500 mg in sodium chloride 0.9 % 500 mL IVPB     1,500 mg 250 mL/hr over 120 Minutes Intravenous  Once 08/17/19 2250 08/18/19 0332   08/17/19 2300  piperacillin-tazobactam (ZOSYN) IVPB 3.375 g     3.375 g 100 mL/hr over 30 Minutes Intravenous  Once 08/17/19 2250 08/18/19 0131      Subjective:  Patient seen and examined at bedside this morning.  Hemodynamically stable.  His hemoglobin is running in in the range of 6 so he has been transfused with 2 units of PRBC.  He actually looks improved.  Denies any shortness of breath.  Still has some sinus tachycardia.  Feels better.  Objective: Vitals:   09/05/19 0057 09/05/19 0522 09/05/19 0930 09/05/19 1000  BP: (!) 109/42 (!) 109/49 117/89 101/62  Pulse: (!) 112 (!) 115 (!) 117 (!) 109  Resp: 18 20 (!) 24 20  Temp: (!) 97.5 F (36.4 C) 97.6 F (36.4 C) 98.8 F (37.1 C) 98.9 F (37.2 C)  TempSrc: Oral Oral Oral Oral  SpO2: 96% 97% 95% 94%  Weight:  67.5 kg  Height:        Intake/Output Summary (Last 24 hours) at 09/05/2019 1137 Last data filed at 09/05/2019 0945 Gross per 24 hour  Intake 3107 ml  Output 4650 ml  Net -1543 ml   Filed Weights   09/03/19 0500 09/04/19 0500 09/05/19 0522  Weight: 66.4 kg 66.7 kg 67.5 kg    Examination:   General exam: Deconditioned, debilitated, thin, chronically ill looking HEENT:PERRL,Oral mucosa moist, Ear/Nose normal on gross exam Respiratory system: Decreased air entry on the left side. Cardiovascular system: S1 & S2  heard, RRR. No JVD, murmurs, rubs, gallops or clicks.Chemo port on the right chest Gastrointestinal system: Abdomen is distended, soft and nontender. . Normal bowel sounds heard. Central nervous system: Alert and oriented. No focal neurological deficits. Extremities:Trace pedal  edema, no clubbing ,no cyanosis Skin: No rashes, lesions or ulcers,no icterus ,no pallor     Data Reviewed: I have personally reviewed following labs and imaging studies  CBC: Recent Labs  Lab 09/01/19 0419 09/02/19 0533 09/03/19 0629 09/04/19 0457 09/05/19 0436  WBC 3.1* 3.6* 3.4* 1.9* 1.8*  NEUTROABS 0.9* 1.9 2.8 1.4* 1.6*  HGB 7.6* 7.7* 7.0* 6.5* 6.9*  HCT 22.9* 23.4* 22.1* 20.3* 21.1*  MCV 87.7 87.6 90.2 89.0 89.8  PLT 51* 47* 47* 47* 36*   Basic Metabolic Panel: Recent Labs  Lab 08/30/19 0424 09/01/19 0419 09/02/19 0533 09/03/19 0629  NA 133* 129* 134* 135  K 4.1 4.0 3.9 4.1  CL 99 94* 97* 100  CO2 '23 25 23 ' 20*  GLUCOSE 113* 84 133* 163*  BUN 23* 21* 28* 34*  CREATININE 0.69 0.56* 0.67 0.77  CALCIUM 10.4* 9.6 9.2 9.0   GFR: Estimated Creatinine Clearance: 98.4 mL/min (by C-G formula based on SCr of 0.77 mg/dL). Liver Function Tests: Recent Labs  Lab 08/30/19 0424 09/01/19 0419 09/02/19 0533 09/03/19 0629  AST 63* 58* 53* 41  ALT 37 '31 27 25  ' ALKPHOS 282* 213* 182* 146*  BILITOT 0.9 1.0 1.0 1.0  PROT 4.2* 4.2* 4.6* 4.2*  ALBUMIN 1.7* 1.8* 2.3* 2.0*   No results for input(s): LIPASE, AMYLASE in the last 168 hours. No results for input(s): AMMONIA in the last 168 hours. Coagulation Profile: Recent Labs  Lab 08/30/19 1037  INR 1.2   Cardiac Enzymes: No results for input(s): CKTOTAL, CKMB, CKMBINDEX, TROPONINI in the last 168 hours. BNP (last 3 results) No results for input(s): PROBNP in the last 8760 hours. HbA1C: No results for input(s): HGBA1C in the last 72 hours. CBG: No results for input(s): GLUCAP in the last 168 hours. Lipid Profile: No results for input(s):  CHOL, HDL, LDLCALC, TRIG, CHOLHDL, LDLDIRECT in the last 72 hours. Thyroid Function Tests: No results for input(s): TSH, T4TOTAL, FREET4, T3FREE, THYROIDAB in the last 72 hours. Anemia Panel: No results for input(s): VITAMINB12, FOLATE, FERRITIN, TIBC, IRON, RETICCTPCT in the last 72 hours. Sepsis Labs: Recent Labs  Lab 09/01/19 2106  LATICACIDVEN 2.7*    Recent Results (from the past 240 hour(s))  Culture, blood (routine x 2)     Status: None (Preliminary result)   Collection Time: 09/01/19  9:06 PM   Specimen: BLOOD LEFT ARM  Result Value Ref Range Status   Specimen Description   Final    BLOOD LEFT ARM Performed at [facility], [address] [place], [place] [address]    Special Requests   Final    BOTTLES DRAWN AEROBIC ONLY Blood Culture results may not be optimal due to an  inadequate volume of blood received in culture bottles Performed at [facility] [address], [place], [place] [address]    Culture   Final    NO GROWTH 3 DAYS Performed at [facility], [place] [address] [place], [place] [address]    Report Status PENDING  Incomplete  Culture, blood (routine x 2)     Status: None (Preliminary result)   Collection Time: 09/01/19  9:06 PM   Specimen: BLOOD RIGHT ARM  Result Value Ref Range Status   Specimen Description   Final    BLOOD RIGHT ARM Performed at [facility], [place] [address] [place], [place] [address]    Special Requests   Final    BOTTLES DRAWN AEROBIC AND ANAEROBIC Blood Culture adequate volume Performed at [facility] [address], [place], [place] [address]    Culture   Final    NO GROWTH 3 DAYS Performed at [facility], [place] [address], [place], [place] [address]    Report Status PENDING  Incomplete         Radiology Studies: No results found.      Scheduled Meds: . sodium chloride   Intravenous Once  . allopurinol  300 mg Oral Daily  . Chlorhexidine Gluconate  Cloth  6 each Topical Daily  . feeding supplement (ENSURE ENLIVE)  237 mL Oral BID BM  . feeding supplement (PRO-STAT SUGAR FREE 64)  30 mL Oral BID  . folic acid  1 mg Oral Daily  . furosemide  20 mg Oral Daily  . hydrocortisone   Rectal TID  . mouth rinse  15 mL Mouth Rinse BID  . multivitamin with minerals  1 tablet Oral Daily  . nicotine  7 mg Transdermal Daily  . nystatin  5 mL Mouth/Throat QID  . pantoprazole  20 mg Oral Daily  . Tbo-filgastrim (GRANIX) SQ  480 mcg Subcutaneous q1800   Continuous Infusions: . sodium chloride       LOS: 19 days    Time spent: 35 mins.More than 50% of that time was spent in counseling and/or coordination of care.      Shelly Coss, MD [facility] Pager [phone]  If 7PM-7AM, please contact night-coverage [email or web address] Password [facility] 09/05/2019, 11:37 AM

## 2019-09-06 ENCOUNTER — Telehealth: Payer: Self-pay | Admitting: Internal Medicine

## 2019-09-06 ENCOUNTER — Other Ambulatory Visit: Payer: Self-pay | Admitting: Oncology

## 2019-09-06 DIAGNOSIS — C8308 Small cell B-cell lymphoma, lymph nodes of multiple sites: Secondary | ICD-10-CM

## 2019-09-06 LAB — TYPE AND SCREEN
ABO/RH(D): O POS
Antibody Screen: NEGATIVE
Unit division: 0
Unit division: 0
Unit division: 0
Unit division: 0

## 2019-09-06 LAB — CBC WITH DIFFERENTIAL/PLATELET
Abs Immature Granulocytes: 0.22 10*3/uL — ABNORMAL HIGH (ref 0.00–0.07)
Basophils Absolute: 0 10*3/uL (ref 0.0–0.1)
Basophils Relative: 1 %
Eosinophils Absolute: 0 10*3/uL (ref 0.0–0.5)
Eosinophils Relative: 0 %
HCT: 26 % — ABNORMAL LOW (ref 39.0–52.0)
Hemoglobin: 8.4 g/dL — ABNORMAL LOW (ref 13.0–17.0)
Immature Granulocytes: 15 %
Lymphocytes Relative: 6 %
Lymphs Abs: 0.1 10*3/uL — ABNORMAL LOW (ref 0.7–4.0)
MCH: 28.8 pg (ref 26.0–34.0)
MCHC: 32.3 g/dL (ref 30.0–36.0)
MCV: 89 fL (ref 80.0–100.0)
Monocytes Absolute: 0.1 10*3/uL (ref 0.1–1.0)
Monocytes Relative: 10 %
Neutro Abs: 1 10*3/uL — ABNORMAL LOW (ref 1.7–7.7)
Neutrophils Relative %: 68 %
Platelets: 38 10*3/uL — ABNORMAL LOW (ref 150–400)
RBC: 2.92 MIL/uL — ABNORMAL LOW (ref 4.22–5.81)
RDW: 18 % — ABNORMAL HIGH (ref 11.5–15.5)
WBC: 1.5 10*3/uL — ABNORMAL LOW (ref 4.0–10.5)
nRBC: 0 % (ref 0.0–0.2)

## 2019-09-06 LAB — BPAM RBC
Blood Product Expiration Date: 202012162359
Blood Product Expiration Date: 202101062359
Blood Product Expiration Date: 202101092359
Blood Product Expiration Date: 202101092359
ISSUE DATE / TIME: 202012041237
ISSUE DATE / TIME: 202012051022
ISSUE DATE / TIME: 202012060938
ISSUE DATE / TIME: 202012061232
Unit Type and Rh: 5100
Unit Type and Rh: 5100
Unit Type and Rh: 5100
Unit Type and Rh: 5100

## 2019-09-06 LAB — BASIC METABOLIC PANEL
Anion gap: 11 (ref 5–15)
BUN: 19 mg/dL (ref 6–20)
CO2: 27 mmol/L (ref 22–32)
Calcium: 9.4 mg/dL (ref 8.9–10.3)
Chloride: 100 mmol/L (ref 98–111)
Creatinine, Ser: 0.42 mg/dL — ABNORMAL LOW (ref 0.61–1.24)
GFR calc Af Amer: >60 mL/min (ref >60)
GFR calc non Af Amer: >60 mL/min (ref >60)
Glucose, Bld: 99 mg/dL (ref 70–99)
Potassium: 4.2 mmol/L (ref 3.5–5.1)
Sodium: 138 mmol/L (ref 135–145)

## 2019-09-06 MED ORDER — ALBUTEROL SULFATE HFA 108 (90 BASE) MCG/ACT IN AERS: 2.0000 | INHALATION_SPRAY | Freq: Four times a day (QID) | RESPIRATORY_TRACT | 0 refills | Status: AC | PRN

## 2019-09-06 MED ORDER — FOLIC ACID 1 MG PO TABS: 1.0000 mg | ORAL_TABLET | Freq: Every day | ORAL | 0 refills | Status: DC

## 2019-09-06 MED ORDER — FUROSEMIDE 20 MG PO TABS: 20.0000 mg | ORAL_TABLET | Freq: Every day | ORAL | 0 refills | Status: DC

## 2019-09-06 MED ORDER — HYDROCORTISONE (PERIANAL) 2.5 % EX CREA: TOPICAL_CREAM | Freq: Three times a day (TID) | CUTANEOUS | 0 refills | Status: AC

## 2019-09-06 MED ORDER — NICOTINE 7 MG/24HR TD PT24: 7.0000 mg | MEDICATED_PATCH | Freq: Every day | TRANSDERMAL | 0 refills | Status: DC

## 2019-09-06 MED ORDER — HEPARIN SOD (PORK) LOCK FLUSH 100 UNIT/ML IV SOLN
500.0000 [IU] | INTRAVENOUS | Status: AC | PRN
  Administered 2019-09-06: 500 [IU]

## 2019-09-06 MED ORDER — TRAMADOL HCL 50 MG PO TABS: 50.0000 mg | ORAL_TABLET | Freq: Four times a day (QID) | ORAL | 0 refills | Status: DC | PRN

## 2019-09-06 MED ORDER — ALLOPURINOL 300 MG PO TABS: 300.0000 mg | ORAL_TABLET | Freq: Every day | ORAL | 0 refills | Status: DC

## 2019-09-06 MED ORDER — TBO-FILGRASTIM 480 MCG/0.8ML ~~LOC~~ SOSY
480.0000 ug | PREFILLED_SYRINGE | Freq: Every day | SUBCUTANEOUS | Status: DC
  Administered 2019-09-06: 480 ug via SUBCUTANEOUS
  Filled 2019-09-06: qty 0.8

## 2019-09-06 MED FILL — traMADol HCL 50 MG TABS: 50 | 5 days supply | Qty: 20 | Fill #0

## 2019-09-06 MED FILL — FOLIC ACID 1 MG TABS: 1 | 30 days supply | Qty: 30 | Fill #0

## 2019-09-06 MED FILL — ALBUTEROL SULFATE HFA 108 (: 108 (90 BAS | 25 days supply | Qty: 18 | Fill #0

## 2019-09-06 MED FILL — FUROSEMIDE 20 MG TABS: 20 | 30 days supply | Qty: 30 | Fill #0

## 2019-09-06 MED FILL — ALLOPURINOL 300 MG TABS: 300 | 30 days supply | Qty: 30 | Fill #0

## 2019-09-06 MED FILL — PROCTOZONE-HC 2.5 % CREA: 2.5 | 10 days supply | Qty: 30 | Fill #0

## 2019-09-06 NOTE — TOC Transition Note (Signed)
Transition of Care [facility]) - CM/SW Discharge Note   Patient Details  Name: Michoel Hohimer MRN: [id number] Date of Birth: September 19, 1961  Transition [facility]) CM/SW Contact:  Trish Mage, LCSW Phone Number: 09/06/2019, 2:04 PM   Clinical Narrative:   Patient to d/c today.  Spoke to sister, who requested help with medications.  I asked her to apply for in state MCD for patient, and told him the same thing.  Sister confirmed she would pick up patient, and that the MD had called her.  I called Santiago Glad with [facility] who confirmed that they will provide [facility] 2 RN, PT and SW.  Will not be providing [facility] aide.  Patient has appointment with a new PCP at [facility] in January. TOC sign off.    Final next level of care: Home w Home Health Services Barriers to Discharge: No Barriers Identified   Patient Goals and CMS Choice Patient states their goals for this hospitalization and ongoing recovery are:: "I dont't know what the results are going to say, to be honest  I am kind of scared."      Discharge Placement                       Discharge Plan and Services In-house Referral: Clinical Social Work                                   Social Determinants of Health (SDOH) Interventions     Readmission Risk Interventions No flowsheet data found.

## 2019-09-06 NOTE — Progress Notes (Signed)
Went over discharge instructions with patient. Patient verbalized understanding.  

## 2019-09-06 NOTE — Progress Notes (Signed)
Pt,, called again for breathing treatment  Because he states that his throat is scratchy.  BBS clear, patient is without distress.

## 2019-09-06 NOTE — Progress Notes (Signed)
Went to re access patient's port and the patient states he is suppose to go home today. I explained to the patient that I could wait and check back with him later on this evening and if he was still here then I would re access the port and the patient was okay with this decision.

## 2019-09-06 NOTE — Discharge Summary (Signed)
Physician Discharge Summary  Isaac Pierce WUJ:[id number] DOB: 1961-08-01 DOA: 08/17/2019  PCP: Patient, No Pcp Per  Admit date: 08/17/2019 Discharge date: 09/06/2019  Admitted From: Home Disposition:  Home  Discharge Condition:Stable CODE STATUS:FULL Diet recommendation:  Regular   Brief/Interim Summary:  Patient is a 58 year old male with history of chronic back pain, GERD, anxiety, homelessness who initially presented with shortness of breath.  He complained of generalized weakness, loss of appetite, shortness of breath, productive cough, recent weight loss, leg swelling and distended abdomen when he presented.  He also noticed lumps underneath his axilla and neck.  He has family history of lymphoma.  He was smoking 1 to 2 packs of cigarettes a day.  When he presented he was tachycardic.  Chest x-ray showed mild vascular congestion, small bilateral effusion.  CT angiogram was negative for PE but showed bulky mediastinal, bilateral hilar, bilateral axillary lymphadenopathy.  Findings are concerning for primary lymphoproliferative process such as lymphoma.  Also found to have noncalcified pulmonary nodules measuring up to 10 mm and also hepatosplenomegaly.  He was also anemic on presentation so he was transfused with PRBCs. Total of 5 units of PRBCs during this hospitalization.    Oncology consulted.  Hospital course remarkable for respiratory distress which improved with Lasix.  He underwent biopsy of the right inguinal lymph node and bone marrow biopsy. He is persistently febrile due to lymphoma.  Also underwent thoracentesis for left-sided pleural effusion.  Bone marrow biopsy report showed atypical lymphocytes suspicious for lymphoproliferative disease. Underwent chemo port placement. He had to be moved to ICU  on 09/01/2019 after he did develop anaphylactic shock after he was started on chemotherapy.This was the second episode.  He was started on Levophed in ICU.  Now off Levophed.  Blood pressure  is more stable.Oncology following and plan to follow him as an outpatient for starting chemotherapy . He is hemodynamically stable for discharge home today. He has high risk of readmission in the near future due to his malignancy, extreme debilitated status.  Following problems were addressed during his hospitalization:  Lymphoproliferative disorder/lymphoma: Presented with 5-day history of dyspnea on exertion, nausea, significant weight loss, diffuse lymphadenopathy.  CT angiogram showed bulky mediastinal, bilateral hilar, bilateral axillary lymphadenopathy.  Also found to have pulmonary nodules, hepatosplenomegaly.  Underwent CT abdomen/pelvis for further staging which showed massive splenomegaly with abdominal and inguinal lymphadenopathy.  Underwent ultrasound-guided liver biopsy by IR of right inguinal lymph node which showed monoclonal CD5 positive B-cell population.  Also underwent CT-guided aspiration and core biopsy of right iliac bone which  showed atypical lymphocytes suspicious for lymphoproliferative disease. Attempted to start chemotherapy here twice,developed anaphylactic reaction, so  stopped. Oncology now planning to start chemotherapy as an outpatient.  Concern for sepsis/lactic acidosis/fever: Lactic acid was elevated on presentation.  Currently improved.  He was persistently febrile from his lymphoma.  Blood cultures have been negative so far.  Antibiotics have been discontinued.  LA improved.  One of the blood culture set showed gram-positive cocci most likely contaminant.  Chest x-ray does not show any clear pneumonia.  Repeat blood cultures are negative.  Persistent sinus tachycardia: Much improved now.  Echocardiogram done here shows normal ejection fraction, no significant other findings.  TSH is elevated.  Low T3, but normal T4.Most likely this is euthyroid sick syndrome.  Pancytopenia:Has leukopenia,anemia,thrombocytopenia.  So far transfused with a total of 9 units of  PRBC.   Continue folic acid.  Avoid anticoagulation.  Given Granix.  Dyspnea/left-sided pleural effusion: Underwent thoracentesis  by IR with removal of 550 mL of yellow fluid.  Currently does not complain of any shortness of breath.  Saturating fine on room air.  Hyponatremia: Stable.Monitor as outpatient  Bilateral lower extremity edema/anasarca: Significantly improved with Lasix.  Anasarca secondary to low albumin from  Malignancy,poor appetite.  D-dimer was elevated at 5.  CT angiogram negative for PE.  Doppler did not show any DVT.Echocardiogram showed normal ejection fraction.Will continue  lasix to 20 mg oral .  Tobacco abuse: Counseled cessation.  Continue nicotine patch.  GERD: Continue PPI  Elevated liver enzymes: Due to abdominal lymphadenopathy.  Continue to monitor.Much improved  Abdominal distention: Secondary to lymphadenopathy, hepatosplenomegaly,ascites.  Denies any abdominal pain today.  Severe protein calorie malnutrition: Dietitian was  Following.  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  You will be called for appointment.  Do a CBC, BMP test during the follow-up 2)Follow up with PCP as an outpatient . 3)Take prescribed medications as instructed.   Increase activity slowly   Complete by: As directed      Allergies as of 09/06/2019      Reactions   Rituximab Rash   Had rash, anxiety,  HTN & tachycardia ~59mn into 1st infusion 08/27/19      Medication List    STOP taking these medications   ibuprofen 200 MG tablet Commonly known as: ADVIL     TAKE these medications   albuterol 108 (90 Base) MCG/ACT inhaler Commonly known as: VENTOLIN HFA Inhale 2 puffs into the lungs every 6 (six) hours as needed for wheezing or shortness of breath.   allopurinol 300 MG tablet Commonly known as: ZYLOPRIM Take 1 tablet (300 mg total) by mouth daily. Start taking on: December 8, 22130  folic acid 1 MG tablet Commonly known as: FOLVITE Take 1 tablet (1 mg total) by mouth daily. Start taking on: September 07, 2019   furosemide 20 MG tablet Commonly known as: LASIX Take 1 tablet (20 mg total) by mouth daily. Start taking on: September 07, 2019   hydrocortisone 2.5 % rectal cream Commonly known as: ANUSOL-HC Place rectally 3 (three) times daily.   hydrOXYzine 10 MG tablet Commonly known as: ATARAX/VISTARIL Take 10 mg by mouth 3 (three) times daily as needed.   nicotine 7 mg/24hr patch Commonly known as: NICODERM CQ - dosed in mg/24 hr Place 1 patch (7 mg total) onto the skin daily. Start taking on: September 07, 2019   omeprazole 20 MG capsule Commonly known as: PRILOSEC Take 20 mg by mouth daily.   traMADol 50 MG tablet Commonly known as: ULTRAM Take 1 tablet (50 mg total) by mouth every 6 (six) hours as needed for moderate pain.      [facility].   Specialty: Internal Medicine Why: 9a Contact information: [address]  [place] [address] [phone]         Allergies  Allergen Reactions  . Rituximab Rash    Had rash, anxiety, HTN & tachycardia ~38mn into 1st infusion 08/27/19    Consultations:  Oncology   Procedures/Studies: Dg Chest 1 View  Result Date: 09/01/2019 CLINICAL DATA:  Shortness of breath. Oxygen desaturation. Chest pain. EXAM: CHEST  1 VIEW COMPARISON:   Radiograph 08/23/2019, CT 08/25/2019 FINDINGS: Patient is rotated. Right chest port with tip in the lower SVC. Increase in bilateral pleural effusions from prior exam. Cardiomegaly which may have increased versus rotation. Increased interstitial thickening suspicious for pulmonary edema. No visualized pneumothorax. Nodularity in thoracic adenopathy seen on chest CT, not well demonstrated radiographically. IMPRESSION: 1. Increase in bilateral pleural effusions. Increased interstitial thickening consistent with pulmonary edema. Overall findings suggest increased fluid overload. 2. Rotated exam. Cardiomegaly with questionable progression versus rotation. Electronically Signed   By: MKeith RakeM.D.   On: 09/01/2019 17:58   Dg Chest 1 View  Result Date: 08/22/2019 CLINICAL DATA:  Status post thoracentesis. EXAM: CHEST  1 VIEW COMPARISON:  Earlier today at 0400 hours. FINDINGS: 1550 hours. Patient rotated left. Midline trachea. Normal heart size. Interval resolution of left-sided pleural fluid. No right-sided effusion. No pneumothorax. Skin fold projects over the left lower chest. Significantly improved left base airspace disease. Right infrahilar atelectasis remains. Again identified is a right midlung 7 mm nodule IMPRESSION: Interval left thoracentesis, without pneumothorax. Resolution of left lower lobe airspace disease, with mild right infrahilar atelectasis remaining. Suspicion of a 7 mm right midlung nodule, as before. Electronically Signed   By: KAbigail MiyamotoM.D.   On: 08/22/2019 16:17   Dg Chest 2 View  Result Date: 08/17/2019 CLINICAL DATA:  Shortness of breath EXAM: CHEST - 2 VIEW COMPARISON:  None. FINDINGS: Small bilateral pleural effusions. Mild basilar airspace disease. Borderline heart size with mild central vascular congestion. No pneumothorax. IMPRESSION: Mild central vascular congestion with small bilateral effusions. Atelectasis or mild pneumonia at the left greater than right lung base.  Electronically Signed   By: KDonavan FoilM.D.   On: 08/17/2019 19:57   Dg Abd 1 View  Result Date: 08/21/2019 CLINICAL DATA:  Abdominal swelling and discomfort. EXAM: ABDOMEN - 1 VIEW COMPARISON:  CT, 08/18/2019. FINDINGS: No bowel dilation to suggest obstruction. Bowel is displaced inferiorly by the markedly enlarged spleen. Soft tissues are poorly defined. No evidence of a renal or ureteral stone. There are scattered aortic and iliac artery atherosclerotic calcifications. IMPRESSION: 1. No bowel obstruction. 2. Enlarged spleen. Poorly defined soft tissues consistent with ascites as noted on the prior CT. Electronically Signed   By: DLajean ManesM.D.   On: 08/21/2019 14:20   Ct Angio Chest Pe W Or Wo Contrast  Result Date: 08/25/2019 CLINICAL DATA:  Tachycardia. 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 EXAM: CT ANGIOGRAPHY CHEST WITH CONTRAST TECHNIQUE: Multidetector CT imaging of the chest was performed using the standard protocol during bolus administration of intravenous contrast. Multiplanar CT image reconstructions and MIPs were obtained to evaluate the vascular anatomy. CONTRAST:  1085mOMNIPAQUE IOHEXOL 350 MG/ML SOLN COMPARISON:  Chest radiograph dated 08/23/2019. CTA chest dated 08/17/2019. FINDINGS: Cardiovascular: Evaluation is constrained by respiratory motion, particularly in the bilateral lower lobes. Within that constraint, there is no evidence of pulmonary embolism to the segmental level. No evidence of thoracic aortic aneurysm or dissection. The heart is normal in size. Moderate pericardial soft tissue/metastases inferiorly (series 4/image 73), unchanged, likely related to the  patient's known lymphoproliferative disorder. Mediastinum/Nodes: Bulky thoracic lymphadenopathy, related to the patient's known lymphoproliferative disorder, including: --2.3 cm short axis high right paratracheal node (series 4/image 28) --3.0 cm short axis AP window node (series 4/image 35) --1.4 cm short axis right  hilar node (series 4/image 42) --2.6 cm short axis subcarinal node (series 4/image 43) --2.1 cm short axis intralobar/right lower lobe node (series 4/image 55) Visualized thyroid is unremarkable. Lungs/Pleura: Small bilateral pulmonary nodules, most of which are pleural/subpleural, unchanged from recent CT, including a dominant 9 mm nodule in the posterior right lower lobe (series 6/image 36). Dominant central pulmonary nodule measures 8 mm in the left lower lobe (series 6/image 78). These findings are likely related to the patient's known lymphoma. Moderate left pleural effusion, stable versus mildly increased, partially loculated. Trace right pleural effusion, mildly decreased. Associated bilateral lower lobe atelectasis. No pneumothorax. Upper Abdomen: Visualized upper abdomen is notable for a small hiatal hernia, masses splenomegaly, and a splenic infarct, better evaluated on recent CT abdomen/pelvis. Musculoskeletal: Degenerative changes of the visualized thoracolumbar spine. Review of the MIP images confirms the above findings. IMPRESSION: No evidence of pulmonary embolism. Bulky thoracic lymphadenopathy, pericardial thickening/soft tissue, and bilateral pulmonary nodules, likely related to the patient's known B-cell lymphoma, grossly unchanged. Moderate left pleural effusion, stable versus mildly increased, partially loculated. Trace right pleural effusion, mildly decreased. Associated bilateral lower lobe atelectasis. Overall, there is no significant interval change from recent CTs. Electronically Signed   By: Julian Hy M.D.   On: 08/25/2019 22:00   Ct Angio Chest Pe W/cm &/or Wo Cm  Result Date: 08/17/2019 CLINICAL DATA:  Shortness of breath for 1 month EXAM: CT ANGIOGRAPHY CHEST WITH CONTRAST TECHNIQUE: Multidetector CT imaging of the chest was performed using the standard protocol during bolus administration of intravenous contrast. Multiplanar CT image reconstructions and MIPs were obtained  to evaluate the vascular anatomy. CONTRAST:  117m OMNIPAQUE IOHEXOL 350 MG/ML SOLN COMPARISON:  Chest x-ray 08/17/2019 FINDINGS: Cardiovascular: Suboptimal contrast bolus timing and respiratory motion artifact degraded examination of the more distal pulmonary arterial tree. No filling defect within the main or lobar branch pulmonary arteries. No evidence of right heart strain. Heart size is normal. There is pericardial thickening. Thoracic aorta is normal in course and caliber. Mediastinum/Nodes: Bulky mediastinal, bilateral hilar, and bilateral axillary lymphadenopathy. Thyroid, trachea, and esophagus are grossly unremarkable. Lungs/Pleura: Small bilateral pleural effusions, left greater than right with associated compressive atelectasis. There are numerous bilateral noncalcified pulmonary nodules. Reference nodules include 10 mm right lower lobe juxtapleural nodule (series 6, image 88). 9 mm medial right lower lobe nodule (series 6, image 106). 7 mm anterior right middle lobe nodule (series 6, image 107). 7 mm left lower lobe nodule (series 6, image 90). 8 mm lingular nodule (series 6, image 92). No pneumothorax. Upper Abdomen: Hepatosplenomegaly within the visualized upper abdomen. Ill-defined area of low density within the peripheral aspect of the spleen measuring approximately 3.7 x 2.8 cm (series 4, image 135). Musculoskeletal: No chest wall abnormality. No acute or significant osseous findings. Review of the MIP images confirms the above findings. IMPRESSION: 1. Limited exam. No filling defect to the lobar branch level to suggest pulmonary embolism. 2. Bulky mediastinal, bilateral hilar, and bilateral axillary lymphadenopathy. Findings concerning for primary lymphoproliferative process such as lymphoma. Metastatic disease secondary to unknown primary is also a consideration. 3. Multiple bilateral noncalcified pulmonary nodules measuring up to 10 mm, likely a result of the same process as listed above. 4.  Small bilateral pleural  effusions, left greater than right with associated compressive atelectasis. 5. Hepatosplenomegaly within the visualized upper abdomen with ill-defined area of low density within the peripheral aspect of the spleen measuring 3.7 x 2.8 cm. Findings are concerning for lymphomatous involvement versus metastatic disease. Alternatively, sequela of splenic injury could have a similar appearance. These results were called by telephone at the time of interpretation on 08/17/2019 at 8:49 pm to provider MARGAUX VENTER , who verbally acknowledged these results. Electronically Signed   By: Davina Poke M.D.   On: 08/17/2019 20:51   US Abdomen Complete  Result Date: 08/21/2019 CLINICAL DATA:  Abdominal pain and distention. History of alcohol and drug abuse. EXAM: ABDOMEN ULTRASOUND COMPLETE COMPARISON:  None. FINDINGS: Gallbladder: Small gallstones and sludge. At least 1 polyp noted measuring 3 mm. There is pericholecystic fluid. No sonographic Murphy's sign. Common bile duct: Diameter: 4 mm Liver: Liver appears mildly enlarged. Increased liver parenchymal echogenicity. No mass or focal lesion. Portal vein is patent on color Doppler imaging with normal direction of blood flow towards the liver. IVC: No abnormality visualized. Pancreas: Hypoechoic lesion either adjacent to or within the pancreatic head measuring 2.3 x 1.4 cm. This could reflect a pancreatic mass or adjacent enlarged gastrohepatic ligament lymph node a less well-defined hypoechoic area is seen in the pancreas below this. No pancreatic duct dilation. Spleen: Enlarged spleen measuring 24 x 9 x 23 cm. No splenic mass or focal lesion. Right Kidney: Length: 12.1 cm. Normal parenchymal echogenicity. There are hypo to anechoic cortical lesions that are consistent with cysts. Mild hydronephrosis. No stones. Left Kidney: Length: 12.5 cm. Echogenicity within normal limits. No mass or hydronephrosis visualized. Abdominal aorta: No aneurysm  visualized. Other findings: Bilateral pleural effusions. IMPRESSION: 1. No convincing acute finding. There are small gallstones, gallbladder sludge and 1 small polyp as well as pericholecystic fluid, but no sonographic Murphy's sign. Gallbladder is also only mildly to moderately distended. No convincing acute cholecystitis. 2. Hepatosplenomegaly. Increased echogenicity of the liver consistent with hepatic steatosis. No liver mass. 3. Bilateral pleural effusions. 4. Mild right hydronephrosis. Hypoechoic to anechoic right renal masses that are likely cysts but not fully characterized. Electronically Signed   By: Lajean Manes M.D.   On: 08/21/2019 14:28   Ct Abdomen Pelvis W Contrast  Result Date: 08/18/2019 CLINICAL DATA:  58 year old with pancytopenia and chest lymphadenopathy. Concern for lymphoproliferative disorder. EXAM: CT ABDOMEN AND PELVIS WITH CONTRAST TECHNIQUE: Multidetector CT imaging of the abdomen and pelvis was performed using the standard protocol following bolus administration of intravenous contrast. CONTRAST:  135m OMNIPAQUE IOHEXOL 300 MG/ML  SOLN COMPARISON:  Chest CT 08/17/2019 FINDINGS: Lower chest: Small bilateral pleural effusions. Partial visualization of the right infrahilar lymphadenopathy. There may be markedly enlarged lymph nodes adjacent to the distal esophagus. Small amount of pericardial fluid. Again noted is interstitial thickening in the right lower lobe with posterior consolidation in the right lower lobe. Again noted is a elongated nodular structure in the right lower lobe measuring 9 mm on sequence 6, image 20. Pleural-based nodules in the right lower lobe and right middle lobe are similar to the recent comparison examination. Again noted is a pleural-based nodule in the lingula. Focal pleural thickening along the base of the left major fissure is asimilar to the previous examination. Hepatobiliary: Normal appearance of the liver. No discrete liver lesions identified. Main  portal venous system is patent. Gallbladder is decompressed. No significant biliary dilatation. Pancreas: Unremarkable. No pancreatic ductal dilatation or surrounding inflammatory changes. Spleen: Spleen is  massive for size measuring 27.0 x 10.6 x 17.4 cm, splenic volume is 2489 mL. Scattered areas of non enhancement throughout the periphery of the spleen probably represent infarcts associated with the large size of the spleen. Indeterminate lesion along the left posterior aspect of spleen on sequence 2, image 44 that measures up to 3.8 cm. Small amount of fluid posterior to the spleen near the left hemidiaphragm. Adrenals/Urinary Tract: Adrenal glands are poorly characterized on this examination but no gross abnormality. Compression on the left kidney due to the splenomegaly. Negative for hydronephrosis. Question fullness of the right renal pelvis but this area is poorly characterized. Mild distention of the urinary bladder. High-density material in the urinary bladder could be related to recent chest CT. Stomach/Bowel: Cannot exclude a hiatal hernia. Stomach is compressed by lymph nodes and the splenomegaly. No evidence for acute bowel inflammation or bowel obstruction. Vascular/Lymphatic: Diffuse atherosclerotic calcifications in the abdominal aorta without aneurysm. Ectasias involving the celiac trunk, measuring roughly 1.1 cm. There is probably stenosis involving the proximal SMA but poorly characterized on this examination. Venous structures are unremarkable. Evidence for enlarged lymph nodes in the gastrohepatic ligament region. Enlarged lymph nodes in the periaortic region. Enlarged lymph nodes along the iliac nodal chains. Markedly enlarged lymph nodes in the right groin. There is a index lymph node in the right groin that measures 2.8 x 2.1 cm on sequence 2, image 78. Multiple small lymph nodes in both inguinal regions. Reproductive: Prostate is unremarkable. Other: Small amount of free fluid in the  pelvis. There appears to be free fluid in the left lower quadrant below the spleen. Negative for free air. Diffuse subcutaneous edema. Dependent fluid in the paraspinal tissues in the lumbar spine. Musculoskeletal: Disc space narrowing at L5-S1. No suspicious bone lesions. IMPRESSION: 1. Massive splenomegaly with abdominal and inguinal lymphadenopathy. Findings are suggestive for a lymphoproliferative process such as lymphoma. 2. Evidence for multiple splenic infarcts likely related to the splenomegaly. Cannot exclude a splenic lesion along the left posterior aspect measuring up to 3.8 cm. 3. Evidence for fluid overload state with diffuse subcutaneous edema, bilateral pleural effusions and ascites. 4. Re-demonstration pleural-based nodularity at the lung bases. There is also septal thickening and a nodular structure in the right lower lobe. Findings are compatible with a neoplastic or lymphoproliferative process. 5. No discrete liver lesions. 6. Aortic Atherosclerosis (ICD10-I70.0). Concern for at least mild stenosis involving the proximal SMA. Ectasia of the celiac trunk. Electronically Signed   By: Markus Daft M.D.   On: 08/18/2019 12:59   Ct Biopsy  Result Date: 08/19/2019 INDICATION: 58 year old male with pancytopenia. He presents for bone marrow biopsy as an inpatient. EXAM: CT GUIDED BONE MARROW ASPIRATION AND CORE BIOPSY Interventional Radiologist:  Criselda Peaches, MD MEDICATIONS: None. ANESTHESIA/SEDATION: 2 mg Versed were administered for anxiolysis. This does not constitute moderate sedation. FLUOROSCOPY TIME:  None. COMPLICATIONS: None immediate. Estimated blood loss: <25 mL PROCEDURE: Informed written consent was obtained from the patient after a thorough discussion of the procedural risks, benefits and alternatives. All questions were addressed. Maximal Sterile Barrier Technique was utilized including caps, mask, sterile gowns, sterile gloves, sterile drape, hand hygiene and skin antiseptic. A  timeout was performed prior to the initiation of the procedure. The patient was positioned prone and non-contrast localization CT was performed of the pelvis to demonstrate the iliac marrow spaces. Maximal barrier sterile technique utilized including caps, mask, sterile gowns, sterile gloves, large sterile drape, hand hygiene, and betadine prep. Under sterile conditions and  local anesthesia, an 11 gauge coaxial bone biopsy needle was advanced into the right iliac marrow space. Needle position was confirmed with CT imaging. Initially, bone marrow aspiration was performed. Next, the 11 gauge outer cannula was utilized to obtain a right iliac bone marrow core biopsy. Needle was removed. Hemostasis was obtained with compression. The patient tolerated the procedure well. Samples were prepared with the cytotechnologist. IMPRESSION: Technically successful CT-guided bone marrow aspiration and biopsy of the right iliac bone. Electronically Signed   By: Jacqulynn Cadet M.D.   On: 08/19/2019 10:38   Dg Chest Port 1 View  Result Date: 08/23/2019 CLINICAL DATA:  Shortness of breath EXAM: PORTABLE CHEST 1 VIEW COMPARISON:  Radiograph 08/22/2019 FINDINGS: Mild left anterior obliquity. Redemonstration of a 9 mm nodule in the right mid lung, seen on comparison CT. Stable bandlike areas of atelectasis and/or scarring. Additional hazy basilar atelectatic changes are present. No significant reaccumulation of the left effusion. No right effusion. Cardiomediastinal contours are stable. No acute osseous or soft tissue abnormality. IMPRESSION: 1. Unchanged 9 mm nodule in the right mid lung, seen on comparison CT. 2. No significant reaccumulation of the left effusion post thoracentesis. 3. Stable bandlike areas of atelectasis and/or scarring. Electronically Signed   By: Lovena Le M.D.   On: 08/23/2019 06:00   Dg Chest Port 1 View  Result Date: 08/22/2019 CLINICAL DATA:  Shortness of breath. EXAM: PORTABLE CHEST 1 VIEW  COMPARISON:  08/21/2019 FINDINGS: 0400 hours. Leftward patient rotation. The cardio pericardial silhouette is enlarged. Interval progression of retrocardiac left base collapse/consolidation with persistent small left effusion. Infrahilar right base atelectasis or infiltrate noted. Nodular density noted at the left base may be a nipple shadow. IMPRESSION: Rotated film with retrocardiac left base collapse/consolidation and effusion. Small nodular density at the right base superimposed on the right fifth rib. Attention on follow-up recommended. Electronically Signed   By: Misty Stanley M.D.   On: 08/22/2019 07:03   Dg Chest Port 1 View  Result Date: 08/21/2019 CLINICAL DATA:  Shortness of breath. EXAM: PORTABLE CHEST 1 VIEW COMPARISON:  08/20/2019 FINDINGS: 0426 hours. Cardiopericardial silhouette is at upper limits of normal for size. Small bilateral pleural effusions are again noted with bibasilar collapse/consolidation. The visualized bony structures of the thorax are intact. Telemetry leads overlie the chest. IMPRESSION: Stable exam. Small bilateral pleural effusions with bibasilar collapse/consolidation. Electronically Signed   By: Misty Stanley M.D.   On: 08/21/2019 07:21   Dg Chest Port 1 View  Result Date: 08/20/2019 CLINICAL DATA:  Shortness of breath EXAM: PORTABLE CHEST 1 VIEW COMPARISON:  Two days ago FINDINGS: Normal heart size. Haziness of the bilateral lower chest from layering pleural fluid with atelectasis by recent CT. No air bronchogram. No pneumothorax. IMPRESSION: Pleural effusions and atelectasis that have increased from 2 days ago. Electronically Signed   By: Monte Fantasia M.D.   On: 08/20/2019 07:33   Dg Chest Port 1 View  Result Date: 08/18/2019 CLINICAL DATA:  Shortness of breath EXAM: PORTABLE CHEST 1 VIEW COMPARISON:  08/18/2019 FINDINGS: Heart is normal size. Bilateral perihilar and lower lobe opacities. No visible effusions. No acute bony abnormality. IMPRESSION: Perihilar  and lower lobe opacities could reflect atelectasis, edema or infection. Electronically Signed   By: Rolm Baptise M.D.   On: 08/18/2019 21:19   Dg Chest Port 1 View  Result Date: 08/18/2019 CLINICAL DATA:  Dyspnea. EXAM: PORTABLE CHEST 1 VIEW COMPARISON:  August 17, 2019. FINDINGS: Stable cardiomediastinal silhouette. No pneumothorax is noted. Mild  bibasilar atelectasis or edema is noted with probable small pleural effusions. Bony thorax is unremarkable. IMPRESSION: Mild bibasilar atelectasis or edema is noted with probable small pleural effusions. Electronically Signed   By: Marijo Conception M.D.   On: 08/18/2019 08:27   Ct Bone Marrow Biopsy & Aspiration  Result Date: 08/19/2019 INDICATION: 58 year old male with pancytopenia. He presents for bone marrow biopsy as an inpatient. EXAM: CT GUIDED BONE MARROW ASPIRATION AND CORE BIOPSY Interventional Radiologist:  Criselda Peaches, MD MEDICATIONS: None. ANESTHESIA/SEDATION: 2 mg Versed were administered for anxiolysis. This does not constitute moderate sedation. FLUOROSCOPY TIME:  None. COMPLICATIONS: None immediate. Estimated blood loss: <25 mL PROCEDURE: Informed written consent was obtained from the patient after a thorough discussion of the procedural risks, benefits and alternatives. All questions were addressed. Maximal Sterile Barrier Technique was utilized including caps, mask, sterile gowns, sterile gloves, sterile drape, hand hygiene and skin antiseptic. A timeout was performed prior to the initiation of the procedure. The patient was positioned prone and non-contrast localization CT was performed of the pelvis to demonstrate the iliac marrow spaces. Maximal barrier sterile technique utilized including caps, mask, sterile gowns, sterile gloves, large sterile drape, hand hygiene, and betadine prep. Under sterile conditions and local anesthesia, an 11 gauge coaxial bone biopsy needle was advanced into the right iliac marrow space. Needle position  was confirmed with CT imaging. Initially, bone marrow aspiration was performed. Next, the 11 gauge outer cannula was utilized to obtain a right iliac bone marrow core biopsy. Needle was removed. Hemostasis was obtained with compression. The patient tolerated the procedure well. Samples were prepared with the cytotechnologist. IMPRESSION: Technically successful CT-guided bone marrow aspiration and biopsy of the right iliac bone. Electronically Signed   By: Jacqulynn Cadet M.D.   On: 08/19/2019 10:38   [place] Core Biopsy (lymph Nodes)  Result Date: 08/18/2019 INDICATION: No known primary, now with extensive lymphadenopathy and splenomegaly worrisome for lymphoma. Please perform ultrasound-guided right inguinal lymph node biopsy for tissue diagnostic purposes. EXAM: ULTRASOUND-GUIDED RIGHT INGUINAL LYMPH NODE BIOPSY COMPARISON:  Chest CT-08/17/2019; CT abdomen and pelvis-earlier same day MEDICATIONS: None ANESTHESIA/SEDATION: None COMPLICATIONS: None immediate. TECHNIQUE: Informed written consent was obtained from the patient after a discussion of the risks, benefits and alternatives to treatment. Questions regarding the procedure were encouraged and answered. Initial ultrasound scanning demonstrated 2 adjacent pathologically enlarged right inguinal lymph nodes compatible with the findings seen on preceding abdominal CT. The dominant approximately 2.7 x 1.6 cm right inguinal lymph node was targeted for biopsy given location and sonographic window (image 7). An ultrasound image was saved for documentation purposes. The procedure was planned. A timeout was performed prior to the initiation of the procedure. The operative was prepped and draped in the usual sterile fashion, and a sterile drape was applied covering the operative field. A timeout was performed prior to the initiation of the procedure. Local anesthesia was provided with 1% lidocaine with epinephrine. Under direct ultrasound guidance, an 18 gauge core  needle device was utilized to obtain to obtain 6 core needle biopsies of the dominant right inguinal lymph node. The samples were placed in saline and submitted to pathology. The needle was removed and hemostasis was achieved with manual compression. Post procedure scan was negative for significant hematoma. A dressing was placed. The patient tolerated the procedure well without immediate postprocedural complication. IMPRESSION: Technically successful ultrasound guided core needle biopsy of dominant right inguinal lymph node. Electronically Signed   By: Sandi Mariscal M.D.   On:  08/18/2019 13:39   Ir Imaging Guided Port Insertion  Result Date: 08/30/2019 INDICATION: Recent diagnosis of B cell lymphoma. In need of durable intravenous access for chemotherapy administration. EXAM: IMPLANTED PORT A CATH PLACEMENT WITH ULTRASOUND AND FLUOROSCOPIC GUIDANCE COMPARISON:  Chest CT - 08/25/2019 MEDICATIONS: Ancef 2 gm IV; The antibiotic was administered within an appropriate time interval prior to skin puncture. ANESTHESIA/SEDATION: Moderate (conscious) sedation was employed during this procedure. A total of Versed 2 mg and Fentanyl 100 mcg was administered intravenously. Moderate Sedation Time: 23 minutes. The patient's level of consciousness and vital signs were monitored continuously by radiology nursing throughout the procedure under my direct supervision. CONTRAST:  None FLUOROSCOPY TIME:  18 seconds (4.6 mGy) COMPLICATIONS: None immediate. PROCEDURE: The procedure, risks, benefits, and alternatives were explained to the patient. Questions regarding the procedure were encouraged and answered. The patient understands and consents to the procedure. The right neck and chest were prepped with chlorhexidine in a sterile fashion, and a sterile drape was applied covering the operative field. Maximum barrier sterile technique with sterile gowns and gloves were used for the procedure. A timeout was performed prior to the  initiation of the procedure. Local anesthesia was provided with 1% lidocaine with epinephrine. After creating a small venotomy incision, a micropuncture kit was utilized to access the internal jugular vein. Real-time ultrasound guidance was utilized for vascular access including the acquisition of a permanent ultrasound image documenting patency of the accessed vessel. The microwire was utilized to measure appropriate catheter length. A subcutaneous port pocket was then created along the upper chest wall utilizing a combination of sharp and blunt dissection. The pocket was irrigated with sterile saline. A single lumen ISP power injectable port was chosen for placement. The 8 Fr catheter was tunneled from the port pocket site to the venotomy incision. The port was placed in the pocket. The external catheter was trimmed to appropriate length. At the venotomy, an 8 Fr peel-away sheath was placed over a guidewire under fluoroscopic guidance. The catheter was then placed through the sheath and the sheath was removed. Final catheter positioning was confirmed and documented with a fluoroscopic spot radiograph. The port was accessed with a Huber needle, aspirated and flushed with heparinized saline. The venotomy site was closed with an interrupted 4-0 Vicryl suture. The port pocket incision was closed with interrupted 2-0 Vicryl suture and the skin was opposed with a running subcuticular 4-0 Vicryl suture. Dermabond and Steri-strips were applied to both incisions. Dressings were placed. The patient tolerated the procedure well without immediate post procedural complication. FINDINGS: After catheter placement, the tip lies within the superior cavoatrial junction. The catheter aspirates and flushes normally and is ready for immediate use. IMPRESSION: Successful placement of a right internal jugular approach power injectable Port-A-Cath. The catheter is ready for immediate use. Electronically Signed   By: Sandi Mariscal M.D.    On: 08/30/2019 16:58   Vas [place] Lower Extremity Venous (dvt)  Result Date: 08/18/2019  Lower Venous Study Indications: Elevated d dimer.  Comparison Study: no prior Performing Technologist: Abram Sander RVS  Examination Guidelines: A complete evaluation includes B-mode imaging, spectral Doppler, color Doppler, and power Doppler as needed of all accessible portions of each vessel. Bilateral testing is considered an integral part of a complete examination. Limited examinations for reoccurring indications may be performed as noted.  +---------+---------------+---------+-----------+----------+--------------+ RIGHT    CompressibilityPhasicitySpontaneityPropertiesThrombus Aging +---------+---------------+---------+-----------+----------+--------------+ CFV      Full           Yes  Yes                                 +---------+---------------+---------+-----------+----------+--------------+ SFJ      Full                                                        +---------+---------------+---------+-----------+----------+--------------+ FV Prox  Full                                                        +---------+---------------+---------+-----------+----------+--------------+ FV Mid   Full                                                        +---------+---------------+---------+-----------+----------+--------------+ FV DistalFull                                                        +---------+---------------+---------+-----------+----------+--------------+ PFV      Full                                                        +---------+---------------+---------+-----------+----------+--------------+ POP      Full           Yes      Yes                                 +---------+---------------+---------+-----------+----------+--------------+ PTV      Full                                                         +---------+---------------+---------+-----------+----------+--------------+ PERO     Full                                                        +---------+---------------+---------+-----------+----------+--------------+   +---------+---------------+---------+-----------+----------+--------------+ LEFT     CompressibilityPhasicitySpontaneityPropertiesThrombus Aging +---------+---------------+---------+-----------+----------+--------------+ CFV      Full           Yes      Yes                                 +---------+---------------+---------+-----------+----------+--------------+ SFJ      Full                                                        +---------+---------------+---------+-----------+----------+--------------+  FV Prox  Full                                                        +---------+---------------+---------+-----------+----------+--------------+ FV Mid   Full                                                        +---------+---------------+---------+-----------+----------+--------------+ FV DistalFull                                                        +---------+---------------+---------+-----------+----------+--------------+ PFV      Full                                                        +---------+---------------+---------+-----------+----------+--------------+ POP      Full           Yes      Yes                                 +---------+---------------+---------+-----------+----------+--------------+ PTV      Full                                                        +---------+---------------+---------+-----------+----------+--------------+ PERO     Full                                                        +---------+---------------+---------+-----------+----------+--------------+     Summary: Right: There is no evidence of deep vein thrombosis in the lower extremity. No cystic structure found in the  popliteal fossa. Left: There is no evidence of deep vein thrombosis in the lower extremity. No cystic structure found in the popliteal fossa.  *See table(s) above for measurements and observations. Electronically signed by Harold Barban MD on 08/18/2019 at 1:53:01 PM.    Final    US Thoracentesis Asp Pleural Space W/img Guide  Result Date: 08/24/2019 INDICATION: Patient with shortness of breath, left pleural effusion. Request made for diagnostic and therapeutic left thoracentesis. EXAM: ULTRASOUND GUIDED DIAGNOSTIC AND THERAPEUTIC LEFT THORACENTESIS MEDICATIONS: 10 mL 1% lidocaine COMPLICATIONS: None immediate. PROCEDURE: An ultrasound guided thoracentesis was thoroughly discussed with the patient and questions answered. The benefits, risks, alternatives and complications were also discussed. The patient understands and wishes to proceed with the procedure. Written consent was obtained. Ultrasound was performed to localize and mark an adequate pocket of fluid in the left chest. The area was then prepped  and draped in the normal sterile fashion. 1% Lidocaine was used for local anesthesia. Under ultrasound guidance a 6 Fr Safe-T-Centesis catheter was introduced. Thoracentesis was performed. The catheter was removed and a dressing applied. FINDINGS: A total of approximately 550 mL of yellow fluid was removed. Samples were sent to the laboratory as requested by the clinical team. IMPRESSION: Successful ultrasound guided left thoracentesis yielding 550 mL of pleural fluid. Read by: Brynda Greathouse PA-C Electronically Signed   By: Jerilynn Mages.  Shick M.D.   On: 08/22/2019 16:59       Subjective: Patient seen and examined the bedside this morning.  Hemodynamically stable today.  Hemoglobin stable at the range of 8 today.  Feels much better.  Stable for discharge to home  Discharge Exam: Vitals:   09/06/19 0528 09/06/19 0617  BP:  110/60  Pulse:  (!) 112  Resp:  20  Temp:  98.3 F (36.8 C)  SpO2: 98% 94%    Vitals:   09/05/19 2149 09/06/19 0528 09/06/19 0617 09/06/19 0637  BP: 111/65  110/60   Pulse: (!) 108  (!) 112   Resp: 20  20   Temp: 98.7 F (37.1 C)  98.3 F (36.8 C)   TempSrc: Oral  Oral   SpO2: 96% 98% 94%   Weight:    62.1 kg  Height:        General: Pt is alert, awake, not in acute distress, very deconditioned Cardiovascular: RRR, S1/S2 +, no rubs, no gallops Respiratory: Decreased air entry on the left side, no wheezing, no rhonchi Abdominal: Soft, NT, distended, hepatosplenomegaly,  Extremities: Trace  edema, no cyanosis    The results of significant diagnostics from this hospitalization (including imaging, microbiology, ancillary and laboratory) are listed below for reference.     Microbiology: Recent Results (from the past 240 hour(s))  Culture, blood (routine x 2)     Status: None (Preliminary result)   Collection Time: 09/01/19  9:06 PM   Specimen: BLOOD LEFT ARM  Result Value Ref Range Status   Specimen Description   Final    BLOOD LEFT ARM Performed at [facility], [address] [place], [place] [address]    Special Requests   Final    BOTTLES DRAWN AEROBIC ONLY Blood Culture results may not be optimal due to an inadequate volume of blood received in culture bottles Performed at [place] [address] [place], [place] [address]    Culture   Final    NO GROWTH 3 DAYS Performed at [facility], [place] [address], [place], [place] [address]    Report Status PENDING  Incomplete  Culture, blood (routine x 2)     Status: None (Preliminary result)   Collection Time: 09/01/19  9:06 PM   Specimen: BLOOD RIGHT ARM  Result Value Ref Range Status   Specimen Description   Final    BLOOD RIGHT ARM Performed at [facility], [place] [address] [place], [place] [address]    Special Requests   Final    BOTTLES DRAWN AEROBIC AND ANAEROBIC Blood Culture adequate volume Performed at [facility]  [address] [place], [place] [address]    Culture   Final    NO GROWTH 3 DAYS Performed at [facility], [place] [address], [place],  [address]    Report Status PENDING  Incomplete     Labs: BNP (last 3 results) Recent Labs    08/17/19 1913 08/18/19  0755  BNP 93.7 58.8   Basic Metabolic Panel: Recent Labs  Lab 09/01/19 0419 09/02/19 0533 09/03/19 0629 09/06/19 0327  NA 129* 134* 135 138  K 4.0 3.9 4.1 4.2  CL 94* 97* 100 100  CO2 25 23 20* 27  GLUCOSE 84 133* 163* 99  BUN 21* 28* 34* 19  CREATININE 0.56* 0.67 0.77 0.42*  CALCIUM 9.6 9.2 9.0 9.4   Liver Function Tests: Recent Labs  Lab 09/01/19 0419 09/02/19 0533 09/03/19 0629  AST 58* 53* 41  ALT _0 ALKPHOS 213* 182* 146*  BILITOT 1.0 1.0 1.0  PROT 4.2* 4.6* 4.2*  ALBUMIN 1.8* 2.3* 2.0*   No results for input(s): LIPASE, AMYLASE in the last 168 hours. No results for input(s): AMMONIA in the last 168 hours. CBC: Recent Labs  Lab 09/02/19 0533 09/03/19 0629 09/04/19 0457 09/05/19 0436 09/06/19 0327  WBC 3.6* 3.4* 1.9* 1.8* 1.5*  NEUTROABS 1.9 2.8 1.4* 1.6* 1.0*  HGB 7.7* 7.0* 6.5* 6.9* 8.4*  HCT 23.4* 22.1* 20.3* 21.1* 26.0*  MCV 87.6 90.2 89.0 89.8 89.0  PLT 47* 47* 47* 36* 38*   Cardiac Enzymes: No results for input(s): CKTOTAL, CKMB, CKMBINDEX, TROPONINI in the last 168 hours. BNP: Invalid input(s): POCBNP CBG: No results for input(s): GLUCAP in the last 168 hours. D-Dimer No results for input(s): DDIMER in the last 72 hours. Hgb A1c No results for input(s): HGBA1C in the last 72 hours. Lipid Profile No results for input(s): CHOL, HDL, LDLCALC, TRIG, CHOLHDL, LDLDIRECT in the last 72 hours. Thyroid function studies No results for input(s): TSH, T4TOTAL, T3FREE, THYROIDAB in the last 72 hours.  Invalid input(s): FREET3 Anemia work up No results for input(s): VITAMINB12, FOLATE, FERRITIN, TIBC, IRON, RETICCTPCT in the last 72 hours. Urinalysis    Component Value Date/Time    COLORURINE YELLOW 08/17/2019 1914   APPEARANCEUR CLEAR 08/17/2019 1914   LABSPEC 1.016 08/17/2019 1914   PHURINE 5.0 08/17/2019 [place] NEGATIVE 08/17/2019 [place] NEGATIVE 08/17/2019 [place] NEGATIVE 08/17/2019 [place] NEGATIVE 08/17/2019 1914   PROTEINUR NEGATIVE 08/17/2019 1914   NITRITE NEGATIVE 08/17/2019 1914   LEUKOCYTESUR NEGATIVE 08/17/2019 1914   Sepsis Labs Invalid input(s): PROCALCITONIN,  WBC,  LACTICIDVEN Microbiology Recent Results (from the past 240 hour(s))  Culture, blood (routine x 2)     Status: None (Preliminary result)   Collection Time: 09/01/19  9:06 PM   Specimen: BLOOD LEFT ARM  Result Value Ref Range Status   Specimen Description   Final    BLOOD LEFT ARM Performed at [facility], [place] [address], [place], [place] [address]    Special Requests   Final    BOTTLES DRAWN AEROBIC ONLY Blood Culture results may not be optimal due to an inadequate volume of blood received in culture bottles Performed at [facility] [address] [place], [place] [address]    Culture   Final    NO GROWTH 3 DAYS Performed at [facility], [place] [address], [place], [place] [address]    Report Status PENDING  Incomplete  Culture, blood (routine x 2)     Status: None (Preliminary result)   Collection Time: 09/01/19  9:06 PM   Specimen: BLOOD RIGHT ARM  Result Value Ref Range Status   Specimen Description   Final    BLOOD RIGHT ARM Performed at [facility], [place] [address], [place], [place] [address]  Special Requests   Final    BOTTLES DRAWN AEROBIC AND ANAEROBIC Blood Culture adequate volume Performed at [facility] [address], [place], [place] [address]    Culture   Final    NO GROWTH 3 DAYS Performed at [facility], [place] [address] [place], [place] [address]    Report Status PENDING  Incomplete    Please note: You were cared for by a hospitalist during  your hospital stay. Once you are discharged, your primary care physician will handle any further medical issues. Please note that NO REFILLS for any discharge medications will be authorized once you are discharged, as it is imperative that you return to your primary care physician (or establish a relationship with a primary care physician if you do not have one) for your post hospital discharge needs so that they can reassess your need for medications and monitor your lab values.    Time coordinating discharge: 40 minutes  SIGNED:   Shelly Coss, MD  [facility] 09/06/2019, 10:51 AM Pager [phone]  If 7PM-7AM, please contact night-coverage [email or web address] Password [email or web address]

## 2019-09-06 NOTE — Progress Notes (Signed)
Physical Therapy Treatment Patient Details Name: Isaac Pierce MRN: [id number] DOB: 03/04/1961 Today's Date: 09/06/2019    History of Present Illness Pt admitted with severe sepsis, hypnatremia, pleural effusion and non-hodgkin Lymphoma    PT Comments    Pt demonstrated good motivation today. Pt was overall pleasant. General bed mobility comments: Pt was able to sit up and lay back down with supervision/CGA for safety. Pt stated that he didnt need help and he sometimes feels claustrophobic General transfer comment: Pt was able to perform a sit to stand with minimal cueing to push up from the bed. Pt was supervison for safety General Gait Details: Pt was able to ambulate 100 ft with a RW. Pt was able to maintain balance throughout treatement. Pt required one standing rest break. Tried to talk to patient about stairs to get into house and he stated that his people were going to take care of him and I do not need to worry he will get in his house. We discussed equipment he had at home (RW) and he stated he didn't need anything else.    Follow Up Recommendations  Home health PT     Equipment Recommendations  None recommended by PT    Recommendations for Other Services       Precautions / Restrictions Precautions Precautions: Fall Restrictions Weight Bearing Restrictions: No    Mobility  Bed Mobility Overal bed mobility: Modified Independent Bed Mobility: Supine to Sit;Sit to Supine     Supine to sit: Min guard;Supervision Sit to supine: Supervision;Min guard   General bed mobility comments: Pt was able to sit up and lay back down with supervision/CGA for safety. Pt stated that he didnt need help and he sometimes feels claustrophobic  Transfers Overall transfer level: Modified independent Equipment used: Rolling walker (2 wheeled) Transfers: Sit to/from Stand Sit to Stand: Supervision         General transfer comment: Pt was able to perform a sit to stand with minimal cueing  to push up from the bed. Pt was supervison for safety  Ambulation/Gait Ambulation/Gait assistance: Supervision;Min guard Gait Distance (Feet): 100 Feet Assistive device: Rolling walker (2 wheeled) Gait Pattern/deviations: Step-through pattern;Decreased stride length     General Gait Details: Pt was able to ambulate 100 ft with a RW. Pt was able to maintain balance throughout treatement. Pt required one standing rest break.   Stairs             Wheelchair Mobility    Modified Rankin (Stroke Patients Only)       Balance                                            Cognition Arousal/Alertness: Awake/alert Behavior During Therapy: WFL for tasks assessed/performed                                          Exercises      General Comments        Pertinent Vitals/Pain Pain Assessment: No/denies pain Pain Intervention(s): Monitored during session;Repositioned    Home Living                      Prior Function            PT Goals (current  goals can now be found in the care plan section) Progress towards PT goals: Progressing toward goals    Frequency    Min 3X/week      PT Plan Current plan remains appropriate    Co-evaluation              AM-PAC PT "6 Clicks" Mobility   Outcome Measure  Help needed turning from your back to your side while in a flat bed without using bedrails?: None Help needed moving from lying on your back to sitting on the side of a flat bed without using bedrails?: None Help needed moving to and from a bed to a chair (including a wheelchair)?: None Help needed standing up from a chair using your arms (e.g., wheelchair or bedside chair)?: None Help needed to walk in hospital room?: None Help needed climbing 3-5 steps with a railing? : A Lot 6 Click Score: 22    End of Session Equipment Utilized During Treatment: Gait belt Activity Tolerance: Patient tolerated treatment  well Patient left: in bed;with call bell/phone within reach Nurse Communication: Mobility status PT Visit Diagnosis: Unsteadiness on feet (R26.81);Muscle weakness (generalized) (M62.81);Difficulty in walking, not elsewhere classified (R26.2)     Time: [id number] PT Time Calculation (min) (ACUTE ONLY): 11 min  Charges:  $Gait Training: 8-22 mins                     Excell Seltzer, [facility]

## 2019-09-06 NOTE — Telephone Encounter (Signed)
A hospital follow up appt has been scheduled for Isaac Pierce to see Dr. Julien Nordmann on 12/8 at 1:45pm w/labs at 1:15pm. Appts have been given to the pt's sister Isaac Pierce.

## 2019-09-07 ENCOUNTER — Inpatient Hospital Stay: Payer: Medicaid Other | Admitting: Internal Medicine

## 2019-09-07 ENCOUNTER — Other Ambulatory Visit: Payer: Medicaid Other

## 2019-09-07 ENCOUNTER — Telehealth: Payer: Self-pay | Admitting: Internal Medicine

## 2019-09-07 LAB — CULTURE, BLOOD (ROUTINE X 2)
Culture: NO GROWTH
Culture: NO GROWTH
Special Requests: ADEQUATE

## 2019-09-07 NOTE — Telephone Encounter (Signed)
Pt's sister cld to reschedule his hospital follow up appt w/Dr. Julien Nordmann to 12/9 at 11:45am w/labs at 11:15am.

## 2019-09-08 ENCOUNTER — Encounter: Payer: Self-pay | Admitting: Internal Medicine

## 2019-09-08 ENCOUNTER — Inpatient Hospital Stay: Payer: Medicaid Other | Attending: Internal Medicine | Admitting: Internal Medicine

## 2019-09-08 ENCOUNTER — Inpatient Hospital Stay: Payer: Medicaid Other

## 2019-09-08 ENCOUNTER — Ambulatory Visit: Payer: Self-pay

## 2019-09-08 ENCOUNTER — Other Ambulatory Visit: Payer: Self-pay

## 2019-09-08 ENCOUNTER — Encounter: Payer: Self-pay | Admitting: Pharmacy Technician

## 2019-09-08 VITALS — BP 110/69 | HR 114 | Temp 98.6°F | Resp 17 | Ht 71.0 in | Wt 142.4 lb

## 2019-09-08 DIAGNOSIS — R1900 Intra-abdominal and pelvic swelling, mass and lump, unspecified site: Secondary | ICD-10-CM | POA: Diagnosis not present

## 2019-09-08 DIAGNOSIS — F17210 Nicotine dependence, cigarettes, uncomplicated: Secondary | ICD-10-CM | POA: Insufficient documentation

## 2019-09-08 DIAGNOSIS — C8598 Non-Hodgkin lymphoma, unspecified, lymph nodes of multiple sites: Secondary | ICD-10-CM | POA: Diagnosis not present

## 2019-09-08 DIAGNOSIS — K5900 Constipation, unspecified: Secondary | ICD-10-CM | POA: Insufficient documentation

## 2019-09-08 DIAGNOSIS — R600 Localized edema: Secondary | ICD-10-CM | POA: Diagnosis not present

## 2019-09-08 DIAGNOSIS — C9110 Chronic lymphocytic leukemia of B-cell type not having achieved remission: Secondary | ICD-10-CM | POA: Diagnosis present

## 2019-09-08 DIAGNOSIS — F419 Anxiety disorder, unspecified: Secondary | ICD-10-CM | POA: Insufficient documentation

## 2019-09-08 DIAGNOSIS — R162 Hepatomegaly with splenomegaly, not elsewhere classified: Secondary | ICD-10-CM | POA: Diagnosis not present

## 2019-09-08 DIAGNOSIS — I700 Atherosclerosis of aorta: Secondary | ICD-10-CM | POA: Diagnosis not present

## 2019-09-08 DIAGNOSIS — N1330 Unspecified hydronephrosis: Secondary | ICD-10-CM | POA: Insufficient documentation

## 2019-09-08 DIAGNOSIS — Z9221 Personal history of antineoplastic chemotherapy: Secondary | ICD-10-CM | POA: Insufficient documentation

## 2019-09-08 DIAGNOSIS — E43 Unspecified severe protein-calorie malnutrition: Secondary | ICD-10-CM

## 2019-09-08 DIAGNOSIS — C8308 Small cell B-cell lymphoma, lymph nodes of multiple sites: Secondary | ICD-10-CM | POA: Diagnosis not present

## 2019-09-08 DIAGNOSIS — Z7189 Other specified counseling: Secondary | ICD-10-CM | POA: Diagnosis not present

## 2019-09-08 DIAGNOSIS — M25519 Pain in unspecified shoulder: Secondary | ICD-10-CM | POA: Insufficient documentation

## 2019-09-08 DIAGNOSIS — R188 Other ascites: Secondary | ICD-10-CM | POA: Insufficient documentation

## 2019-09-08 DIAGNOSIS — Z79899 Other long term (current) drug therapy: Secondary | ICD-10-CM | POA: Diagnosis not present

## 2019-09-08 DIAGNOSIS — K219 Gastro-esophageal reflux disease without esophagitis: Secondary | ICD-10-CM | POA: Diagnosis not present

## 2019-09-08 DIAGNOSIS — T451X5A Adverse effect of antineoplastic and immunosuppressive drugs, initial encounter: Secondary | ICD-10-CM

## 2019-09-08 DIAGNOSIS — J90 Pleural effusion, not elsewhere classified: Secondary | ICD-10-CM | POA: Insufficient documentation

## 2019-09-08 DIAGNOSIS — D701 Agranulocytosis secondary to cancer chemotherapy: Secondary | ICD-10-CM | POA: Diagnosis not present

## 2019-09-08 DIAGNOSIS — D61818 Other pancytopenia: Secondary | ICD-10-CM | POA: Insufficient documentation

## 2019-09-08 DIAGNOSIS — R000 Tachycardia, unspecified: Secondary | ICD-10-CM | POA: Diagnosis not present

## 2019-09-08 DIAGNOSIS — Z5111 Encounter for antineoplastic chemotherapy: Secondary | ICD-10-CM

## 2019-09-08 LAB — CBC WITH DIFFERENTIAL (CANCER CENTER ONLY)
Abs Immature Granulocytes: 0 10*3/uL (ref 0.00–0.07)
Basophils Absolute: 0 10*3/uL (ref 0.0–0.1)
Basophils Relative: 10 %
Eosinophils Absolute: 0 10*3/uL (ref 0.0–0.5)
Eosinophils Relative: 0 %
HCT: 26.4 % — ABNORMAL LOW (ref 39.0–52.0)
Hemoglobin: 8.7 g/dL — ABNORMAL LOW (ref 13.0–17.0)
Immature Granulocytes: 0 %
Lymphocytes Relative: 35 %
Lymphs Abs: 0.1 10*3/uL — ABNORMAL LOW (ref 0.7–4.0)
MCH: 28.6 pg (ref 26.0–34.0)
MCHC: 33 g/dL (ref 30.0–36.0)
MCV: 86.8 fL (ref 80.0–100.0)
Monocytes Absolute: 0.1 10*3/uL (ref 0.1–1.0)
Monocytes Relative: 29 %
Neutro Abs: 0.1 10*3/uL — CL (ref 1.7–7.7)
Neutrophils Relative %: 26 %
Platelet Count: 36 10*3/uL — ABNORMAL LOW (ref 150–400)
RBC: 3.04 MIL/uL — ABNORMAL LOW (ref 4.22–5.81)
RDW: 17.2 % — ABNORMAL HIGH (ref 11.5–15.5)
WBC Count: 0.3 10*3/uL — CL (ref 4.0–10.5)
nRBC: 0 % (ref 0.0–0.2)

## 2019-09-08 LAB — CMP (CANCER CENTER ONLY)
ALT: 39 U/L (ref 0–44)
AST: 34 U/L (ref 15–41)
Albumin: 2.3 g/dL — ABNORMAL LOW (ref 3.5–5.0)
Alkaline Phosphatase: 258 U/L — ABNORMAL HIGH (ref 38–126)
Anion gap: 9 (ref 5–15)
BUN: 15 mg/dL (ref 6–20)
CO2: 32 mmol/L (ref 22–32)
Calcium: 9.1 mg/dL (ref 8.9–10.3)
Chloride: 95 mmol/L — ABNORMAL LOW (ref 98–111)
Creatinine: 0.58 mg/dL — ABNORMAL LOW (ref 0.61–1.24)
GFR, Est AFR Am: >60 mL/min (ref >60)
GFR, Estimated: >60 mL/min (ref >60)
Glucose, Bld: 112 mg/dL — ABNORMAL HIGH (ref 70–99)
Potassium: 4.2 mmol/L (ref 3.5–5.1)
Sodium: 136 mmol/L (ref 135–145)
Total Bilirubin: 0.7 mg/dL (ref 0.3–1.2)
Total Protein: 4.6 g/dL — ABNORMAL LOW (ref 6.5–8.1)

## 2019-09-08 LAB — LACTATE DEHYDROGENASE: LDH: 455 U/L — ABNORMAL HIGH (ref 98–192)

## 2019-09-08 MED ORDER — ALLOPURINOL 300 MG PO TABS: 300.0000 mg | ORAL_TABLET | Freq: Every day | ORAL | 0 refills | Status: DC

## 2019-09-08 MED ORDER — TBO-FILGRASTIM 300 MCG/0.5ML ~~LOC~~ SOSY
PREFILLED_SYRINGE | SUBCUTANEOUS | Status: AC
  Filled 2019-09-08: qty 0.5

## 2019-09-08 MED ORDER — FILGRASTIM-SNDZ 300 MCG/0.5ML IJ SOSY
PREFILLED_SYRINGE | INTRAMUSCULAR | Status: AC
  Filled 2019-09-08: qty 0.5

## 2019-09-08 MED ORDER — TBO-FILGRASTIM 300 MCG/0.5ML ~~LOC~~ SOSY: 300.0000 ug | PREFILLED_SYRINGE | Freq: Once | SUBCUTANEOUS | Status: DC

## 2019-09-08 MED ORDER — FILGRASTIM-SNDZ 300 MCG/0.5ML IJ SOSY
300.0000 ug | PREFILLED_SYRINGE | Freq: Once | INTRAMUSCULAR | Status: AC
  Administered 2019-09-08: 300 ug via SUBCUTANEOUS

## 2019-09-08 MED ORDER — HYDROXYZINE HCL 10 MG PO TABS: 10.0000 mg | ORAL_TABLET | Freq: Three times a day (TID) | ORAL | 1 refills | Status: DC | PRN

## 2019-09-08 MED FILL — ALLOPURINOL 300 MG TABS: 300 | 30 days supply | Qty: 30 | Fill #0

## 2019-09-08 MED FILL — hydrOXYzine HCL 10 MG TABS: 10 | 10 days supply | Qty: 30 | Fill #0

## 2019-09-08 NOTE — Patient Instructions (Signed)
Steps to Quit Smoking Smoking tobacco is the leading cause of preventable death. It can affect almost every organ in the body. Smoking puts you and people around you at risk for many serious, long-lasting (chronic) diseases. Quitting smoking can be hard, but it is one of the best things that you can do for your health. It is never too late to quit. How do I get ready to quit? When you decide to quit smoking, make a plan to help you succeed. Before you quit:  Pick a date to quit. Set a date within the next 2 weeks to give you time to prepare.  Write down the reasons why you are quitting. Keep this list in places where you will see it often.  Tell your family, friends, and co-workers that you are quitting. Their support is important.  Talk with your doctor about the choices that may help you quit.  Find out if your health insurance will pay for these treatments.  Know the people, places, things, and activities that make you want to smoke (triggers). Avoid them. What first steps can I take to quit smoking?  Throw away all cigarettes at home, at work, and in your car.  Throw away the things that you use when you smoke, such as ashtrays and lighters.  Clean your car. Make sure to empty the ashtray.  Clean your home, including curtains and carpets. What can I do to help me quit smoking? Talk with your doctor about taking medicines and seeing a counselor at the same time. You are more likely to succeed when you do both.  If you are pregnant or breastfeeding, talk with your doctor about counseling or other ways to quit smoking. Do not take medicine to help you quit smoking unless your doctor tells you to do so. To quit smoking: Quit right away  Quit smoking totally, instead of slowly cutting back on how much you smoke over a period of time.  Go to counseling. You are more likely to quit if you go to counseling sessions regularly. Take medicine You may take medicines to help you quit. Some  medicines need a prescription, and some you can buy over-the-counter. Some medicines may contain a drug called nicotine to replace the nicotine in cigarettes. Medicines may:  Help you to stop having the desire to smoke (cravings).  Help to stop the problems that come when you stop smoking (withdrawal symptoms). Your doctor may ask you to use:  Nicotine patches, gum, or lozenges.  Nicotine inhalers or sprays.  Non-nicotine medicine that is taken by mouth. Find resources Find resources and other ways to help you quit smoking and remain smoke-free after you quit. These resources are most helpful when you use them often. They include:  Online chats with a counselor.  Phone quitlines.  Printed self-help materials.  Support groups or group counseling.  Text messaging programs.  Mobile phone apps. Use apps on your mobile phone or tablet that can help you stick to your quit plan. There are many free apps for mobile phones and tablets as well as websites. Examples include Quit Guide from the CDC and smokefree.gov  What things can I do to make it easier to quit?   Talk to your family and friends. Ask them to support and encourage you.  Call a phone quitline (1-800-QUIT-NOW), reach out to support groups, or work with a counselor.  Ask people who smoke to not smoke around you.  Avoid places that make you want to smoke,   such as: ? Bars. ? Parties. ? Smoke-break areas at work.  Spend time with people who do not smoke.  Lower the stress in your life. Stress can make you want to smoke. Try these things to help your stress: ? Getting regular exercise. ? Doing deep-breathing exercises. ? Doing yoga. ? Meditating. ? Doing a body scan. To do this, close your eyes, focus on one area of your body at a time from head to toe. Notice which parts of your body are tense. Try to relax the muscles in those areas. How will I feel when I quit smoking? Day 1 to 3 weeks Within the first 24 hours,  you may start to have some problems that come from quitting tobacco. These problems are very bad 2-3 days after you quit, but they do not often last for more than 2-3 weeks. You may get these symptoms:  Mood swings.  Feeling restless, nervous, angry, or annoyed.  Trouble concentrating.  Dizziness.  Strong desire for high-sugar foods and nicotine.  Weight gain.  Trouble pooping (constipation).  Feeling like you may vomit (nausea).  Coughing or a sore throat.  Changes in how the medicines that you take for other issues work in your body.  Depression.  Trouble sleeping (insomnia). Week 3 and afterward After the first 2-3 weeks of quitting, you may start to notice more positive results, such as:  Better sense of smell and taste.  Less coughing and sore throat.  Slower heart rate.  Lower blood pressure.  Clearer skin.  Better breathing.  Fewer sick days. Quitting smoking can be hard. Do not give up if you fail the first time. Some people need to try a few times before they succeed. Do your best to stick to your quit plan, and talk with your doctor if you have any questions or concerns. Summary  Smoking tobacco is the leading cause of preventable death. Quitting smoking can be hard, but it is one of the best things that you can do for your health.  When you decide to quit smoking, make a plan to help you succeed.  Quit smoking right away, not slowly over a period of time.  When you start quitting, seek help from your doctor, family, or friends. This information is not intended to replace advice given to you by your health care provider. Make sure you discuss any questions you have with your health care provider. Document Released: 07/13/2009 Document Revised: 12/04/2018 Document Reviewed: 12/05/2018 Elsevier Patient Education  2020 Elsevier Inc.  

## 2019-09-08 NOTE — Patient Instructions (Signed)
Tbo-Filgrastim injection What is this medicine? TBO-FILGRASTIM (T B O fil GRA stim) is a granulocyte colony-stimulating factor that helps you make more neutrophils, a type of white blood cell. Neutrophils are important for fighting infections. Some chemotherapy affects your bone marrow and lowers your neutrophils. This medicine helps decrease the length of time that neutrophils are very low (severe neutropenia). This medicine may be used for other purposes; ask your health care provider or pharmacist if you have questions. COMMON BRAND NAME(S): Granix What should I tell my health care provider before I take this medicine? They need to know if you have any of these conditions:  bone scan or tests planned  kidney disease  sickle cell anemia  an unusual or allergic reaction to tbo-filgrastim, filgrastim, pegfilgrastim, other medicines, foods, dyes, or preservatives  pregnant or trying to get pregnant  breast-feeding How should I use this medicine? This medicine is for injection under the skin. If you get this medicine at home, you will be taught how to prepare and give this medicine. Refer to the Instructions for Use that come with your medication packaging. Use exactly as directed. Take your medicine at regular intervals. Do not take your medicine more often than directed. It is important that you put your used needles and syringes in a special sharps container. Do not put them in a trash can. If you do not have a sharps container, call your pharmacist or healthcare provider to get one. Talk to your pediatrician regarding the use of this medicine in children. While this drug may be prescribed for children as young as 1 month of age for selected conditions, precautions do apply. Overdosage: If you think you have taken too much of this medicine contact a poison control center or emergency room at once. NOTE: This medicine is only for you. Do not share this medicine with others. What if I miss a  dose? It is important not to miss your dose. Call your doctor or health care professional if you miss a dose. What may interact with this medicine? This medicine may interact with the following medications:  medicines that may cause a release of neutrophils, such as lithium This list may not describe all possible interactions. Give your health care provider a list of all the medicines, herbs, non-prescription drugs, or dietary supplements you use. Also tell them if you smoke, drink alcohol, or use illegal drugs. Some items may interact with your medicine. What should I watch for while using this medicine? You may need blood work done while you are taking this medicine. What side effects may I notice from receiving this medicine? Side effects that you should report to your doctor or health care professional as soon as possible:  allergic reactions like skin rash, itching or hives, swelling of the face, lips, or tongue  back pain  blood in the urine  dark urine  dizziness  fast heartbeat  feeling faint  shortness of breath or breathing problems  signs and symptoms of infection like fever or chills; cough; or sore throat  signs and symptoms of kidney injury like trouble passing urine or change in the amount of urine  stomach or side pain, or pain at the shoulder  sweating  swelling of the legs, ankles, or abdomen  tiredness Side effects that usually do not require medical attention (report to your doctor or health care professional if they continue or are bothersome):  bone pain  diarrhea  headache  muscle pain  vomiting This list may   not describe all possible side effects. Call your doctor for medical advice about side effects. You may report side effects to FDA at 1-800-FDA-1088. Where should I keep my medicine? Keep out of the reach of children. Store in a refrigerator between 2 and 8 degrees C (36 and 46 degrees F). Keep in carton to protect from light. Throw  away this medicine if it is left out of the refrigerator for more than 5 consecutive days. Throw away any unused medicine after the expiration date. NOTE: This sheet is a summary. It may not cover all possible information. If you have questions about this medicine, talk to your doctor, pharmacist, or health care provider.  2020 Elsevier/Gold Standard (2018-07-18 19:58:39)  

## 2019-09-08 NOTE — Progress Notes (Signed)
Met w/ pt's sister Isaac Pierce to introduce myself as pt's Arboriculturist and to discuss her financial concerns.  After reading the FA's notes from the hospital I reminded her what's needed in order to apply for [facility] Medicaid.  She stated she hasn't contacted DSS in [place] yet so until she does that pt cannot apply for Medicaid in [place].  I also informed her that I have to wait until I review Dr. Worthy Flank notes and treatment plan before I can offer any assistance once I do, I will contact her to offer what's available at that time.  I also emailed Rob for drug replacement review once the treatment plan has been established.  She has my card for any questions or concerns she may have in the future.

## 2019-09-08 NOTE — Progress Notes (Signed)
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  PRIOR THERAPY: The patient was tried on single agent Rituxan during his hospitalization in December 2020 discontinued secondary to significant hypersensitivity reaction.  CURRENT THERAPY: Systemic chemotherapy with CHOP, status post 1 cycle.  First cycle was given on September 01, 2019.  INTERVAL HISTORY: Isaac Pierce 58 y.o. male returns to the clinic today for hospital follow-up visit accompanied by his sister [name].  The patient is feeling much better today.  He notes a significant decrease in the lymphadenopathy in the axilla as well as improvement in his abdominal distention after receiving the first dose of his systemic chemotherapy with CHOP.  This treatment was done in the hospital on September 01, 2019.  The patient had severe pancytopenia secondary to tumor infiltration of his bone marrow.  He is feeling well today.  He denied having any current chest pain, shortness of breath, cough or hemoptysis.  He denied having any fever or chills.  He has no nausea, vomiting, diarrhea or constipation.  He has no headache or visual changes.  He is here today for evaluation and repeat blood work.  MEDICAL HISTORY: Past Medical History:  Diagnosis Date   Anxiety    Back pain    GERD (gastroesophageal reflux disease)     ALLERGIES:  is allergic to rituximab.  MEDICATIONS:  Current Outpatient Medications  Medication Sig Dispense Refill   albuterol (VENTOLIN HFA) 108 (90 Base) MCG/ACT inhaler Inhale 2 puffs into the lungs every 6 (six) hours as needed for wheezing or shortness of breath. 6.7 g 0   allopurinol (ZYLOPRIM) 300 MG tablet Take 1 tablet (300 mg total) by mouth daily. 30 tablet 0   folic acid (FOLVITE) 1 MG  tablet Take 1 tablet (1 mg total) by mouth daily. 30 tablet 0   furosemide (LASIX) 20 MG tablet Take 1 tablet (20 mg total) by mouth daily. 30 tablet 0   hydrocortisone (ANUSOL-HC) 2.5 % rectal cream Place rectally 3 (three) times daily. 30 g 0   hydrOXYzine (ATARAX/VISTARIL) 10 MG tablet Take 10 mg by mouth 3 (three) times daily as needed.     nicotine (NICODERM CQ - DOSED IN MG/24 HR) 7 mg/24hr patch Place 1 patch (7 mg total) onto the skin daily. 28 patch 0   omeprazole (PRILOSEC) 20 MG capsule Take 20 mg by mouth daily.     traMADol (ULTRAM) 50 MG tablet Take 1 tablet (50 mg total) by mouth every 6 (six) hours as needed for moderate pain. 20 tablet 0   No current facility-administered medications for this visit.     SURGICAL HISTORY:  Past Surgical History:  Procedure Laterality Date   IR IMAGING GUIDED PORT INSERTION  08/30/2019    REVIEW OF SYSTEMS:  Constitutional: positive for fatigue Eyes: negative Ears, nose, mouth, throat, and face: negative Respiratory: negative Cardiovascular: negative Gastrointestinal: negative Genitourinary:negative Integument/breast: negative Hematologic/lymphatic: negative Musculoskeletal:negative Neurological: negative Behavioral/Psych: negative Endocrine: negative Allergic/Immunologic: negative   PHYSICAL EXAMINATION: General appearance: alert, cooperative, fatigued and no distress Head: Normocephalic, without obvious abnormality, atraumatic Neck: no adenopathy, no JVD, supple, symmetrical, trachea midline and thyroid not enlarged, symmetric, no tenderness/mass/nodules Lymph nodes: Cervical, supraclavicular, and axillary nodes normal. Resp: clear to auscultation bilaterally Back: symmetric, no curvature. ROM normal. No CVA tenderness. Cardio: regular  rate and rhythm, S1, S2 normal, no murmur, click, rub or gallop GI: soft, non-tender; bowel sounds normal; no masses,  no organomegaly Extremities: extremities normal, atraumatic, no  cyanosis or edema Neurologic: Alert and oriented X 3, normal strength and tone. Normal symmetric reflexes. Normal coordination and gait  ECOG PERFORMANCE STATUS: 1 - Symptomatic but completely ambulatory  Blood pressure 110/69, pulse (!) 114, temperature 98.6 F (37 C), temperature source Temporal, resp. rate 17, height 5' 11" (1.803 m), weight 142 lb 6.4 oz (64.6 kg), SpO2 98 %.  LABORATORY DATA: Lab Results  Component Value Date   WBC 0.3 (LL) 09/08/2019   HGB 8.7 (L) 09/08/2019   HCT 26.4 (L) 09/08/2019   MCV 86.8 09/08/2019   PLT 36 (L) 09/08/2019      Chemistry      Component Value Date/Time   NA 138 09/06/2019 0327   K 4.2 09/06/2019 0327   CL 100 09/06/2019 0327   CO2 27 09/06/2019 0327   BUN 19 09/06/2019 0327   CREATININE 0.42 (L) 09/06/2019 0327      Component Value Date/Time   CALCIUM 9.4 09/06/2019 0327   ALKPHOS 146 (H) 09/03/2019 0629   AST 41 09/03/2019 0629   ALT 25 09/03/2019 0629   BILITOT 1.0 09/03/2019 0629       RADIOGRAPHIC STUDIES: Dg Chest 1 View  Result Date: 09/01/2019 CLINICAL DATA:  Shortness of breath. Oxygen desaturation. Chest pain. EXAM: CHEST  1 VIEW COMPARISON:  Radiograph 08/23/2019, CT 08/25/2019 FINDINGS: Patient is rotated. Right chest port with tip in the lower SVC. Increase in bilateral pleural effusions from prior exam. Cardiomegaly which may have increased versus rotation. Increased interstitial thickening suspicious for pulmonary edema. No visualized pneumothorax. Nodularity in thoracic adenopathy seen on chest CT, not well demonstrated radiographically. IMPRESSION: 1. Increase in bilateral pleural effusions. Increased interstitial thickening consistent with pulmonary edema. Overall findings suggest increased fluid overload. 2. Rotated exam. Cardiomegaly with questionable progression versus rotation. Electronically Signed   By: Keith Rake M.D.   On: 09/01/2019 17:58   Dg Chest 1 View  Result Date: 08/22/2019 CLINICAL DATA:   Status post thoracentesis. EXAM: CHEST  1 VIEW COMPARISON:  Earlier today at 0400 hours. FINDINGS: 1550 hours. Patient rotated left. Midline trachea. Normal heart size. Interval resolution of left-sided pleural fluid. No right-sided effusion. No pneumothorax. Skin fold projects over the left lower chest. Significantly improved left base airspace disease. Right infrahilar atelectasis remains. Again identified is a right midlung 7 mm nodule IMPRESSION: Interval left thoracentesis, without pneumothorax. Resolution of left lower lobe airspace disease, with mild right infrahilar atelectasis remaining. Suspicion of a 7 mm right midlung nodule, as before. Electronically Signed   By: Abigail Miyamoto M.D.   On: 08/22/2019 16:17   Dg Chest 2 View  Result Date: 08/17/2019 CLINICAL DATA:  Shortness of breath EXAM: CHEST - 2 VIEW COMPARISON:  None. FINDINGS: Small bilateral pleural effusions. Mild basilar airspace disease. Borderline heart size with mild central vascular congestion. No pneumothorax. IMPRESSION: Mild central vascular congestion with small bilateral effusions. Atelectasis or mild pneumonia at the left greater than right lung base. Electronically Signed   By: Donavan Foil M.D.   On: 08/17/2019 19:57   Dg Abd 1 View  Result Date: 08/21/2019 CLINICAL DATA:  Abdominal swelling and discomfort. EXAM: ABDOMEN - 1 VIEW COMPARISON:  CT, 08/18/2019. FINDINGS: No bowel dilation to suggest obstruction. Bowel is displaced inferiorly by the markedly enlarged spleen. Soft tissues are poorly defined. No evidence of a  renal or ureteral stone. There are scattered aortic and iliac artery atherosclerotic calcifications. IMPRESSION: 1. No bowel obstruction. 2. Enlarged spleen. Poorly defined soft tissues consistent with ascites as noted on the prior CT. Electronically Signed   By: Lajean Manes M.D.   On: 08/21/2019 14:20   Ct Angio Chest Pe W Or Wo Contrast  Result Date: 08/25/2019 CLINICAL DATA:  Tachycardia. B-cell  lymphoma on recent biopsy. EXAM: CT ANGIOGRAPHY CHEST WITH CONTRAST TECHNIQUE: Multidetector CT imaging of the chest was performed using the standard protocol during bolus administration of intravenous contrast. Multiplanar CT image reconstructions and MIPs were obtained to evaluate the vascular anatomy. CONTRAST:  147m OMNIPAQUE IOHEXOL 350 MG/ML SOLN COMPARISON:  Chest radiograph dated 08/23/2019. CTA chest dated 08/17/2019. FINDINGS: Cardiovascular: Evaluation is constrained by respiratory motion, particularly in the bilateral lower lobes. Within that constraint, there is no evidence of pulmonary embolism to the segmental level. No evidence of thoracic aortic aneurysm or dissection. The heart is normal in size. Moderate pericardial soft tissue/metastases inferiorly (series 4/image 73), unchanged, likely related to the patient's known lymphoproliferative disorder. Mediastinum/Nodes: Bulky thoracic lymphadenopathy, related to the patient's known lymphoproliferative disorder, including: --2.3 cm short axis high right paratracheal node (series 4/image 28) --3.0 cm short axis AP window node (series 4/image 35) --1.4 cm short axis right hilar node (series 4/image 42) --2.6 cm short axis subcarinal node (series 4/image 43) --2.1 cm short axis intralobar/right lower lobe node (series 4/image 55) Visualized thyroid is unremarkable. Lungs/Pleura: Small bilateral pulmonary nodules, most of which are pleural/subpleural, unchanged from recent CT, including a dominant 9 mm nodule in the posterior right lower lobe (series 6/image 36). Dominant central pulmonary nodule measures 8 mm in the left lower lobe (series 6/image 78). These findings are likely related to the patient's known lymphoma. Moderate left pleural effusion, stable versus mildly increased, partially loculated. Trace right pleural effusion, mildly decreased. Associated bilateral lower lobe atelectasis. No pneumothorax. Upper Abdomen: Visualized upper abdomen is  notable for a small hiatal hernia, masses splenomegaly, and a splenic infarct, better evaluated on recent CT abdomen/pelvis. Musculoskeletal: Degenerative changes of the visualized thoracolumbar spine. Review of the MIP images confirms the above findings. IMPRESSION: No evidence of pulmonary embolism. Bulky thoracic lymphadenopathy, pericardial thickening/soft tissue, and bilateral pulmonary nodules, likely related to the patient's known B-cell lymphoma, grossly unchanged. Moderate left pleural effusion, stable versus mildly increased, partially loculated. Trace right pleural effusion, mildly decreased. Associated bilateral lower lobe atelectasis. Overall, there is no significant interval change from recent CTs. Electronically Signed   By: SJulian HyM.D.   On: 08/25/2019 22:00   Ct Angio Chest Pe W/cm &/or Wo Cm  Result Date: 08/17/2019 CLINICAL DATA:  Shortness of breath for 1 month EXAM: CT ANGIOGRAPHY CHEST WITH CONTRAST TECHNIQUE: Multidetector CT imaging of the chest was performed using the standard protocol during bolus administration of intravenous contrast. Multiplanar CT image reconstructions and MIPs were obtained to evaluate the vascular anatomy. CONTRAST:  1032mOMNIPAQUE IOHEXOL 350 MG/ML SOLN COMPARISON:  Chest x-ray 08/17/2019 FINDINGS: Cardiovascular: Suboptimal contrast bolus timing and respiratory motion artifact degraded examination of the more distal pulmonary arterial tree. No filling defect within the main or lobar branch pulmonary arteries. No evidence of right heart strain. Heart size is normal. There is pericardial thickening. Thoracic aorta is normal in course and caliber. Mediastinum/Nodes: Bulky mediastinal, bilateral hilar, and bilateral axillary lymphadenopathy. Thyroid, trachea, and esophagus are grossly unremarkable. Lungs/Pleura: Small bilateral pleural effusions, left greater than right with associated compressive atelectasis. There  are numerous bilateral noncalcified  pulmonary nodules. Reference nodules include 10 mm right lower lobe juxtapleural nodule (series 6, image 88). 9 mm medial right lower lobe nodule (series 6, image 106). 7 mm anterior right middle lobe nodule (series 6, image 107). 7 mm left lower lobe nodule (series 6, image 90). 8 mm lingular nodule (series 6, image 92). No pneumothorax. Upper Abdomen: Hepatosplenomegaly within the visualized upper abdomen. Ill-defined area of low density within the peripheral aspect of the spleen measuring approximately 3.7 x 2.8 cm (series 4, image 135). Musculoskeletal: No chest wall abnormality. No acute or significant osseous findings. Review of the MIP images confirms the above findings. IMPRESSION: 1. Limited exam. No filling defect to the lobar branch level to suggest pulmonary embolism. 2. Bulky mediastinal, bilateral hilar, and bilateral axillary lymphadenopathy. Findings concerning for primary lymphoproliferative process such as lymphoma. Metastatic disease secondary to unknown primary is also a consideration. 3. Multiple bilateral noncalcified pulmonary nodules measuring up to 10 mm, likely a result of the same process as listed above. 4. Small bilateral pleural effusions, left greater than right with associated compressive atelectasis. 5. Hepatosplenomegaly within the visualized upper abdomen with ill-defined area of low density within the peripheral aspect of the spleen measuring 3.7 x 2.8 cm. Findings are concerning for lymphomatous involvement versus metastatic disease. Alternatively, sequela of splenic injury could have a similar appearance. These results were called by telephone at the time of interpretation on 08/17/2019 at 8:49 pm to provider MARGAUX VENTER , who verbally acknowledged these results. Electronically Signed   By: Davina Poke M.D.   On: 08/17/2019 20:51   US Abdomen Complete  Result Date: 08/21/2019 CLINICAL DATA:  Abdominal pain and distention. History of alcohol and drug abuse. EXAM:  ABDOMEN ULTRASOUND COMPLETE COMPARISON:  None. FINDINGS: Gallbladder: Small gallstones and sludge. At least 1 polyp noted measuring 3 mm. There is pericholecystic fluid. No sonographic Murphy's sign. Common bile duct: Diameter: 4 mm Liver: Liver appears mildly enlarged. Increased liver parenchymal echogenicity. No mass or focal lesion. Portal vein is patent on color Doppler imaging with normal direction of blood flow towards the liver. IVC: No abnormality visualized. Pancreas: Hypoechoic lesion either adjacent to or within the pancreatic head measuring 2.3 x 1.4 cm. This could reflect a pancreatic mass or adjacent enlarged gastrohepatic ligament lymph node a less well-defined hypoechoic area is seen in the pancreas below this. No pancreatic duct dilation. Spleen: Enlarged spleen measuring 24 x 9 x 23 cm. No splenic mass or focal lesion. Right Kidney: Length: 12.1 cm. Normal parenchymal echogenicity. There are hypo to anechoic cortical lesions that are consistent with cysts. Mild hydronephrosis. No stones. Left Kidney: Length: 12.5 cm. Echogenicity within normal limits. No mass or hydronephrosis visualized. Abdominal aorta: No aneurysm visualized. Other findings: Bilateral pleural effusions. IMPRESSION: 1. No convincing acute finding. There are small gallstones, gallbladder sludge and 1 small polyp as well as pericholecystic fluid, but no sonographic Murphy's sign. Gallbladder is also only mildly to moderately distended. No convincing acute cholecystitis. 2. Hepatosplenomegaly. Increased echogenicity of the liver consistent with hepatic steatosis. No liver mass. 3. Bilateral pleural effusions. 4. Mild right hydronephrosis. Hypoechoic to anechoic right renal masses that are likely cysts but not fully characterized. Electronically Signed   By: Lajean Manes M.D.   On: 08/21/2019 14:28   Ct Abdomen Pelvis W Contrast  Result Date: 08/18/2019 CLINICAL DATA:  58 year old with pancytopenia and chest lymphadenopathy.  Concern for lymphoproliferative disorder. EXAM: CT ABDOMEN AND PELVIS WITH CONTRAST TECHNIQUE: Multidetector CT  imaging of the abdomen and pelvis was performed using the standard protocol following bolus administration of intravenous contrast. CONTRAST:  160m OMNIPAQUE IOHEXOL 300 MG/ML  SOLN COMPARISON:  Chest CT 08/17/2019 FINDINGS: Lower chest: Small bilateral pleural effusions. Partial visualization of the right infrahilar lymphadenopathy. There may be markedly enlarged lymph nodes adjacent to the distal esophagus. Small amount of pericardial fluid. Again noted is interstitial thickening in the right lower lobe with posterior consolidation in the right lower lobe. Again noted is a elongated nodular structure in the right lower lobe measuring 9 mm on sequence 6, image 20. Pleural-based nodules in the right lower lobe and right middle lobe are similar to the recent comparison examination. Again noted is a pleural-based nodule in the lingula. Focal pleural thickening along the base of the left major fissure is asimilar to the previous examination. Hepatobiliary: Normal appearance of the liver. No discrete liver lesions identified. Main portal venous system is patent. Gallbladder is decompressed. No significant biliary dilatation. Pancreas: Unremarkable. No pancreatic ductal dilatation or surrounding inflammatory changes. Spleen: Spleen is massive for size measuring 27.0 x 10.6 x 17.4 cm, splenic volume is 2489 mL. Scattered areas of non enhancement throughout the periphery of the spleen probably represent infarcts associated with the large size of the spleen. Indeterminate lesion along the left posterior aspect of spleen on sequence 2, image 44 that measures up to 3.8 cm. Small amount of fluid posterior to the spleen near the left hemidiaphragm. Adrenals/Urinary Tract: Adrenal glands are poorly characterized on this examination but no gross abnormality. Compression on the left kidney due to the splenomegaly.  Negative for hydronephrosis. Question fullness of the right renal pelvis but this area is poorly characterized. Mild distention of the urinary bladder. High-density material in the urinary bladder could be related to recent chest CT. Stomach/Bowel: Cannot exclude a hiatal hernia. Stomach is compressed by lymph nodes and the splenomegaly. No evidence for acute bowel inflammation or bowel obstruction. Vascular/Lymphatic: Diffuse atherosclerotic calcifications in the abdominal aorta without aneurysm. Ectasias involving the celiac trunk, measuring roughly 1.1 cm. There is probably stenosis involving the proximal SMA but poorly characterized on this examination. Venous structures are unremarkable. Evidence for enlarged lymph nodes in the gastrohepatic ligament region. Enlarged lymph nodes in the periaortic region. Enlarged lymph nodes along the iliac nodal chains. Markedly enlarged lymph nodes in the right groin. There is a index lymph node in the right groin that measures 2.8 x 2.1 cm on sequence 2, image 78. Multiple small lymph nodes in both inguinal regions. Reproductive: Prostate is unremarkable. Other: Small amount of free fluid in the pelvis. There appears to be free fluid in the left lower quadrant below the spleen. Negative for free air. Diffuse subcutaneous edema. Dependent fluid in the paraspinal tissues in the lumbar spine. Musculoskeletal: Disc space narrowing at L5-S1. No suspicious bone lesions. IMPRESSION: 1. Massive splenomegaly with abdominal and inguinal lymphadenopathy. Findings are suggestive for a lymphoproliferative process such as lymphoma. 2. Evidence for multiple splenic infarcts likely related to the splenomegaly. Cannot exclude a splenic lesion along the left posterior aspect measuring up to 3.8 cm. 3. Evidence for fluid overload state with diffuse subcutaneous edema, bilateral pleural effusions and ascites. 4. Re-demonstration pleural-based nodularity at the lung bases. There is also septal  thickening and a nodular structure in the right lower lobe. Findings are compatible with a neoplastic or lymphoproliferative process. 5. No discrete liver lesions. 6. Aortic Atherosclerosis (ICD10-I70.0). Concern for at least mild stenosis involving the proximal SMA. Ectasia of  the celiac trunk. Electronically Signed   By: Markus Daft M.D.   On: 08/18/2019 12:59   Ct Biopsy  Result Date: 08/19/2019 INDICATION: 58 year old male with pancytopenia. He presents for bone marrow biopsy as an inpatient. EXAM: CT GUIDED BONE MARROW ASPIRATION AND CORE BIOPSY Interventional Radiologist:  Criselda Peaches, MD MEDICATIONS: None. ANESTHESIA/SEDATION: 2 mg Versed were administered for anxiolysis. This does not constitute moderate sedation. FLUOROSCOPY TIME:  None. COMPLICATIONS: None immediate. Estimated blood loss: <25 mL PROCEDURE: Informed written consent was obtained from the patient after a thorough discussion of the procedural risks, benefits and alternatives. All questions were addressed. Maximal Sterile Barrier Technique was utilized including caps, mask, sterile gowns, sterile gloves, sterile drape, hand hygiene and skin antiseptic. A timeout was performed prior to the initiation of the procedure. The patient was positioned prone and non-contrast localization CT was performed of the pelvis to demonstrate the iliac marrow spaces. Maximal barrier sterile technique utilized including caps, mask, sterile gowns, sterile gloves, large sterile drape, hand hygiene, and betadine prep. Under sterile conditions and local anesthesia, an 11 gauge coaxial bone biopsy needle was advanced into the right iliac marrow space. Needle position was confirmed with CT imaging. Initially, bone marrow aspiration was performed. Next, the 11 gauge outer cannula was utilized to obtain a right iliac bone marrow core biopsy. Needle was removed. Hemostasis was obtained with compression. The patient tolerated the procedure well. Samples were  prepared with the cytotechnologist. IMPRESSION: Technically successful CT-guided bone marrow aspiration and biopsy of the right iliac bone. Electronically Signed   By: Jacqulynn Cadet M.D.   On: 08/19/2019 10:38   Dg Chest Port 1 View  Result Date: 08/23/2019 CLINICAL DATA:  Shortness of breath EXAM: PORTABLE CHEST 1 VIEW COMPARISON:  Radiograph 08/22/2019 FINDINGS: Mild left anterior obliquity. Redemonstration of a 9 mm nodule in the right mid lung, seen on comparison CT. Stable bandlike areas of atelectasis and/or scarring. Additional hazy basilar atelectatic changes are present. No significant reaccumulation of the left effusion. No right effusion. Cardiomediastinal contours are stable. No acute osseous or soft tissue abnormality. IMPRESSION: 1. Unchanged 9 mm nodule in the right mid lung, seen on comparison CT. 2. No significant reaccumulation of the left effusion post thoracentesis. 3. Stable bandlike areas of atelectasis and/or scarring. Electronically Signed   By: Lovena Le M.D.   On: 08/23/2019 06:00   Dg Chest Port 1 View  Result Date: 08/22/2019 CLINICAL DATA:  Shortness of breath. EXAM: PORTABLE CHEST 1 VIEW COMPARISON:  08/21/2019 FINDINGS: 0400 hours. Leftward patient rotation. The cardio pericardial silhouette is enlarged. Interval progression of retrocardiac left base collapse/consolidation with persistent small left effusion. Infrahilar right base atelectasis or infiltrate noted. Nodular density noted at the left base may be a nipple shadow. IMPRESSION: Rotated film with retrocardiac left base collapse/consolidation and effusion. Small nodular density at the right base superimposed on the right fifth rib. Attention on follow-up recommended. Electronically Signed   By: Misty Stanley M.D.   On: 08/22/2019 07:03   Dg Chest Port 1 View  Result Date: 08/21/2019 CLINICAL DATA:  Shortness of breath. EXAM: PORTABLE CHEST 1 VIEW COMPARISON:  08/20/2019 FINDINGS: 0426 hours.  Cardiopericardial silhouette is at upper limits of normal for size. Small bilateral pleural effusions are again noted with bibasilar collapse/consolidation. The visualized bony structures of the thorax are intact. Telemetry leads overlie the chest. IMPRESSION: Stable exam. Small bilateral pleural effusions with bibasilar collapse/consolidation. Electronically Signed   By: Misty Stanley M.D.   On:  08/21/2019 07:21   Dg Chest Port 1 View  Result Date: 08/20/2019 CLINICAL DATA:  Shortness of breath EXAM: PORTABLE CHEST 1 VIEW COMPARISON:  Two days ago FINDINGS: Normal heart size. Haziness of the bilateral lower chest from layering pleural fluid with atelectasis by recent CT. No air bronchogram. No pneumothorax. IMPRESSION: Pleural effusions and atelectasis that have increased from 2 days ago. Electronically Signed   By: Monte Fantasia M.D.   On: 08/20/2019 07:33   Dg Chest Port 1 View  Result Date: 08/18/2019 CLINICAL DATA:  Shortness of breath EXAM: PORTABLE CHEST 1 VIEW COMPARISON:  08/18/2019 FINDINGS: Heart is normal size. Bilateral perihilar and lower lobe opacities. No visible effusions. No acute bony abnormality. IMPRESSION: Perihilar and lower lobe opacities could reflect atelectasis, edema or infection. Electronically Signed   By: Rolm Baptise M.D.   On: 08/18/2019 21:19   Dg Chest Port 1 View  Result Date: 08/18/2019 CLINICAL DATA:  Dyspnea. EXAM: PORTABLE CHEST 1 VIEW COMPARISON:  August 17, 2019. FINDINGS: Stable cardiomediastinal silhouette. No pneumothorax is noted. Mild bibasilar atelectasis or edema is noted with probable small pleural effusions. Bony thorax is unremarkable. IMPRESSION: Mild bibasilar atelectasis or edema is noted with probable small pleural effusions. Electronically Signed   By: Marijo Conception M.D.   On: 08/18/2019 08:27   Ct Bone Marrow Biopsy & Aspiration  Result Date: 08/19/2019 INDICATION: 58 year old male with pancytopenia. He presents for bone marrow  biopsy as an inpatient. EXAM: CT GUIDED BONE MARROW ASPIRATION AND CORE BIOPSY Interventional Radiologist:  Criselda Peaches, MD MEDICATIONS: None. ANESTHESIA/SEDATION: 2 mg Versed were administered for anxiolysis. This does not constitute moderate sedation. FLUOROSCOPY TIME:  None. COMPLICATIONS: None immediate. Estimated blood loss: <25 mL PROCEDURE: Informed written consent was obtained from the patient after a thorough discussion of the procedural risks, benefits and alternatives. All questions were addressed. Maximal Sterile Barrier Technique was utilized including caps, mask, sterile gowns, sterile gloves, sterile drape, hand hygiene and skin antiseptic. A timeout was performed prior to the initiation of the procedure. The patient was positioned prone and non-contrast localization CT was performed of the pelvis to demonstrate the iliac marrow spaces. Maximal barrier sterile technique utilized including caps, mask, sterile gowns, sterile gloves, large sterile drape, hand hygiene, and betadine prep. Under sterile conditions and local anesthesia, an 11 gauge coaxial bone biopsy needle was advanced into the right iliac marrow space. Needle position was confirmed with CT imaging. Initially, bone marrow aspiration was performed. Next, the 11 gauge outer cannula was utilized to obtain a right iliac bone marrow core biopsy. Needle was removed. Hemostasis was obtained with compression. The patient tolerated the procedure well. Samples were prepared with the cytotechnologist. IMPRESSION: Technically successful CT-guided bone marrow aspiration and biopsy of the right iliac bone. Electronically Signed   By: Jacqulynn Cadet M.D.   On: 08/19/2019 10:38   [place] Core Biopsy (lymph Nodes)  Result Date: 08/18/2019 INDICATION: No known primary, now with extensive lymphadenopathy and splenomegaly worrisome for lymphoma. Please perform ultrasound-guided right inguinal lymph node biopsy for tissue diagnostic purposes. EXAM:  ULTRASOUND-GUIDED RIGHT INGUINAL LYMPH NODE BIOPSY COMPARISON:  Chest CT-08/17/2019; CT abdomen and pelvis-earlier same day MEDICATIONS: None ANESTHESIA/SEDATION: None COMPLICATIONS: None immediate. TECHNIQUE: Informed written consent was obtained from the patient after a discussion of the risks, benefits and alternatives to treatment. Questions regarding the procedure were encouraged and answered. Initial ultrasound scanning demonstrated 2 adjacent pathologically enlarged right inguinal lymph nodes compatible with the findings seen on preceding abdominal CT.  The dominant approximately 2.7 x 1.6 cm right inguinal lymph node was targeted for biopsy given location and sonographic window (image 7). An ultrasound image was saved for documentation purposes. The procedure was planned. A timeout was performed prior to the initiation of the procedure. The operative was prepped and draped in the usual sterile fashion, and a sterile drape was applied covering the operative field. A timeout was performed prior to the initiation of the procedure. Local anesthesia was provided with 1% lidocaine with epinephrine. Under direct ultrasound guidance, an 18 gauge core needle device was utilized to obtain to obtain 6 core needle biopsies of the dominant right inguinal lymph node. The samples were placed in saline and submitted to pathology. The needle was removed and hemostasis was achieved with manual compression. Post procedure scan was negative for significant hematoma. A dressing was placed. The patient tolerated the procedure well without immediate postprocedural complication. IMPRESSION: Technically successful ultrasound guided core needle biopsy of dominant right inguinal lymph node. Electronically Signed   By: Sandi Mariscal M.D.   On: 08/18/2019 13:39   Ir Imaging Guided Port Insertion  Result Date: 08/30/2019 INDICATION: Recent diagnosis of B cell lymphoma. In need of durable intravenous access for chemotherapy  administration. EXAM: IMPLANTED PORT A CATH PLACEMENT WITH ULTRASOUND AND FLUOROSCOPIC GUIDANCE COMPARISON:  Chest CT - 08/25/2019 MEDICATIONS: Ancef 2 gm IV; The antibiotic was administered within an appropriate time interval prior to skin puncture. ANESTHESIA/SEDATION: Moderate (conscious) sedation was employed during this procedure. A total of Versed 2 mg and Fentanyl 100 mcg was administered intravenously. Moderate Sedation Time: 23 minutes. The patient's level of consciousness and vital signs were monitored continuously by radiology nursing throughout the procedure under my direct supervision. CONTRAST:  None FLUOROSCOPY TIME:  18 seconds (4.6 mGy) COMPLICATIONS: None immediate. PROCEDURE: The procedure, risks, benefits, and alternatives were explained to the patient. Questions regarding the procedure were encouraged and answered. The patient understands and consents to the procedure. The right neck and chest were prepped with chlorhexidine in a sterile fashion, and a sterile drape was applied covering the operative field. Maximum barrier sterile technique with sterile gowns and gloves were used for the procedure. A timeout was performed prior to the initiation of the procedure. Local anesthesia was provided with 1% lidocaine with epinephrine. After creating a small venotomy incision, a micropuncture kit was utilized to access the internal jugular vein. Real-time ultrasound guidance was utilized for vascular access including the acquisition of a permanent ultrasound image documenting patency of the accessed vessel. The microwire was utilized to measure appropriate catheter length. A subcutaneous port pocket was then created along the upper chest wall utilizing a combination of sharp and blunt dissection. The pocket was irrigated with sterile saline. A single lumen ISP power injectable port was chosen for placement. The 8 Fr catheter was tunneled from the port pocket site to the venotomy incision. The port was  placed in the pocket. The external catheter was trimmed to appropriate length. At the venotomy, an 8 Fr peel-away sheath was placed over a guidewire under fluoroscopic guidance. The catheter was then placed through the sheath and the sheath was removed. Final catheter positioning was confirmed and documented with a fluoroscopic spot radiograph. The port was accessed with a Huber needle, aspirated and flushed with heparinized saline. The venotomy site was closed with an interrupted 4-0 Vicryl suture. The port pocket incision was closed with interrupted 2-0 Vicryl suture and the skin was opposed with a running subcuticular 4-0 Vicryl suture. Dermabond  and Steri-strips were applied to both incisions. Dressings were placed. The patient tolerated the procedure well without immediate post procedural complication. FINDINGS: After catheter placement, the tip lies within the superior cavoatrial junction. The catheter aspirates and flushes normally and is ready for immediate use. IMPRESSION: Successful placement of a right internal jugular approach power injectable Port-A-Cath. The catheter is ready for immediate use. Electronically Signed   By: Sandi Mariscal M.D.   On: 08/30/2019 16:58   Vas [place] Lower Extremity Venous (dvt)  Result Date: 08/18/2019  Lower Venous Study Indications: Elevated d dimer.  Comparison Study: no prior Performing Technologist: Abram Sander RVS  Examination Guidelines: A complete evaluation includes B-mode imaging, spectral Doppler, color Doppler, and power Doppler as needed of all accessible portions of each vessel. Bilateral testing is considered an integral part of a complete examination. Limited examinations for reoccurring indications may be performed as noted.  +---------+---------------+---------+-----------+----------+--------------+  RIGHT     Compressibility Phasicity Spontaneity Properties Thrombus Aging  +---------+---------------+---------+-----------+----------+--------------+  CFV        Full            Yes       Yes                                    +---------+---------------+---------+-----------+----------+--------------+  SFJ       Full                                                             +---------+---------------+---------+-----------+----------+--------------+  FV Prox   Full                                                             +---------+---------------+---------+-----------+----------+--------------+  FV Mid    Full                                                             +---------+---------------+---------+-----------+----------+--------------+  FV Distal Full                                                             +---------+---------------+---------+-----------+----------+--------------+  PFV       Full                                                             +---------+---------------+---------+-----------+----------+--------------+  POP       Full            Yes  Yes                                    +---------+---------------+---------+-----------+----------+--------------+  PTV       Full                                                             +---------+---------------+---------+-----------+----------+--------------+  PERO      Full                                                             +---------+---------------+---------+-----------+----------+--------------+   +---------+---------------+---------+-----------+----------+--------------+  LEFT      Compressibility Phasicity Spontaneity Properties Thrombus Aging  +---------+---------------+---------+-----------+----------+--------------+  CFV       Full            Yes       Yes                                    +---------+---------------+---------+-----------+----------+--------------+  SFJ       Full                                                             +---------+---------------+---------+-----------+----------+--------------+  FV Prox   Full                                                              +---------+---------------+---------+-----------+----------+--------------+  FV Mid    Full                                                             +---------+---------------+---------+-----------+----------+--------------+  FV Distal Full                                                             +---------+---------------+---------+-----------+----------+--------------+  PFV       Full                                                             +---------+---------------+---------+-----------+----------+--------------+  POP       Full            Yes       Yes                                    +---------+---------------+---------+-----------+----------+--------------+  PTV       Full                                                             +---------+---------------+---------+-----------+----------+--------------+  PERO      Full                                                             +---------+---------------+---------+-----------+----------+--------------+     Summary: Right: There is no evidence of deep vein thrombosis in the lower extremity. No cystic structure found in the popliteal fossa. Left: There is no evidence of deep vein thrombosis in the lower extremity. No cystic structure found in the popliteal fossa.  *See table(s) above for measurements and observations. Electronically signed by Harold Barban MD on 08/18/2019 at 1:53:01 PM.    Final    US Thoracentesis Asp Pleural Space W/img Guide  Result Date: 08/24/2019 INDICATION: Patient with shortness of breath, left pleural effusion. Request made for diagnostic and therapeutic left thoracentesis. EXAM: ULTRASOUND GUIDED DIAGNOSTIC AND THERAPEUTIC LEFT THORACENTESIS MEDICATIONS: 10 mL 1% lidocaine COMPLICATIONS: None immediate. PROCEDURE: An ultrasound guided thoracentesis was thoroughly discussed with the patient and questions answered. The benefits, risks, alternatives and complications were also discussed. The patient  understands and wishes to proceed with the procedure. Written consent was obtained. Ultrasound was performed to localize and mark an adequate pocket of fluid in the left chest. The area was then prepped and draped in the normal sterile fashion. 1% Lidocaine was used for local anesthesia. Under ultrasound guidance a 6 Fr Safe-T-Centesis catheter was introduced. Thoracentesis was performed. The catheter was removed and a dressing applied. FINDINGS: A total of approximately 550 mL of yellow fluid was removed. Samples were sent to the laboratory as requested by the clinical team. IMPRESSION: Successful ultrasound guided left thoracentesis yielding 550 mL of pleural fluid. Read by: Brynda Greathouse PA-C Electronically Signed   By: Jerilynn Mages.  Shick M.D.   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  He also had significant pancytopenia on presentation.  He was admitted to the hospital recently with sepsis that was treated. The patient received the first cycle of his systemic chemotherapy with [facility] in the hospital on September 01, 2019 and he is feeling much better.  He was given trial of single agent Rituxan as well as Rituxan with [facility] but has significant hypersensitivity reaction with severe shortness of breath, diaphoresis and chest pain.  The treatment with Rituxan was discontinued. He started noticing  significant improvement in the lymphadenopathy as well as the abdominal distention after the first cycle of his treatment. I recommended for the patient to continue this treatment on outpatient basis and he is expected to start cycle #2 on September 22, 2019. I would be reluctant in giving him any treatment with Rituxan at this point. For the chemotherapy-induced  neutropenia, we will give the patient treatment with Granix 300 mcg subcutaneously for the next 3 days. For the tumor lysis, the patient will continue on allopurinol and I give him refill of his medication today. The patient will continue to have weekly blood work for close monitoring of his condition and blood count. I will see him back for follow-up visit in 2 weeks for evaluation before starting cycle #2. He was advised to call immediately if he has any other concerning symptoms in the interval. The patient voices understanding of current disease status and treatment options and is in agreement with the current care plan.  All questions were answered. The patient knows to call the clinic with any problems, questions or concerns. We can certainly see the patient much sooner if necessary.  I spent 15 minutes counseling the patient face to face. The total time spent in the appointment was 25 minutes.  Disclaimer: This note was dictated with voice recognition software. Similar sounding words can inadvertently be transcribed and may not be corrected upon review.

## 2019-09-09 ENCOUNTER — Other Ambulatory Visit: Payer: Self-pay

## 2019-09-09 ENCOUNTER — Telehealth: Payer: Self-pay | Admitting: *Deleted

## 2019-09-09 ENCOUNTER — Inpatient Hospital Stay: Payer: Medicaid Other

## 2019-09-09 VITALS — BP 118/78 | HR 88 | Temp 98.2°F | Resp 18

## 2019-09-09 DIAGNOSIS — C9110 Chronic lymphocytic leukemia of B-cell type not having achieved remission: Secondary | ICD-10-CM | POA: Diagnosis not present

## 2019-09-09 DIAGNOSIS — D701 Agranulocytosis secondary to cancer chemotherapy: Secondary | ICD-10-CM

## 2019-09-09 MED ORDER — FILGRASTIM-SNDZ 300 MCG/0.5ML IJ SOSY
300.0000 ug | PREFILLED_SYRINGE | Freq: Once | INTRAMUSCULAR | Status: AC
  Administered 2019-09-09: 300 ug via SUBCUTANEOUS

## 2019-09-09 MED ORDER — FILGRASTIM-SNDZ 300 MCG/0.5ML IJ SOSY
PREFILLED_SYRINGE | INTRAMUSCULAR | Status: AC
  Filled 2019-09-09: qty 0.5

## 2019-09-09 NOTE — Patient Instructions (Signed)
Tbo-Filgrastim injection What is this medicine? TBO-FILGRASTIM (T B O fil GRA stim) is a granulocyte colony-stimulating factor that helps you make more neutrophils, a type of white blood cell. Neutrophils are important for fighting infections. Some chemotherapy affects your bone marrow and lowers your neutrophils. This medicine helps decrease the length of time that neutrophils are very low (severe neutropenia). This medicine may be used for other purposes; ask your health care provider or pharmacist if you have questions. COMMON BRAND NAME(S): Granix What should I tell my health care provider before I take this medicine? They need to know if you have any of these conditions:  bone scan or tests planned  kidney disease  sickle cell anemia  an unusual or allergic reaction to tbo-filgrastim, filgrastim, pegfilgrastim, other medicines, foods, dyes, or preservatives  pregnant or trying to get pregnant  breast-feeding How should I use this medicine? This medicine is for injection under the skin. If you get this medicine at home, you will be taught how to prepare and give this medicine. Refer to the Instructions for Use that come with your medication packaging. Use exactly as directed. Take your medicine at regular intervals. Do not take your medicine more often than directed. It is important that you put your used needles and syringes in a special sharps container. Do not put them in a trash can. If you do not have a sharps container, call your pharmacist or healthcare provider to get one. Talk to your pediatrician regarding the use of this medicine in children. While this drug may be prescribed for children as young as 1 month of age for selected conditions, precautions do apply. Overdosage: If you think you have taken too much of this medicine contact a poison control center or emergency room at once. NOTE: This medicine is only for you. Do not share this medicine with others. What if I miss a  dose? It is important not to miss your dose. Call your doctor or health care professional if you miss a dose. What may interact with this medicine? This medicine may interact with the following medications:  medicines that may cause a release of neutrophils, such as lithium This list may not describe all possible interactions. Give your health care provider a list of all the medicines, herbs, non-prescription drugs, or dietary supplements you use. Also tell them if you smoke, drink alcohol, or use illegal drugs. Some items may interact with your medicine. What should I watch for while using this medicine? You may need blood work done while you are taking this medicine. What side effects may I notice from receiving this medicine? Side effects that you should report to your doctor or health care professional as soon as possible:  allergic reactions like skin rash, itching or hives, swelling of the face, lips, or tongue  back pain  blood in the urine  dark urine  dizziness  fast heartbeat  feeling faint  shortness of breath or breathing problems  signs and symptoms of infection like fever or chills; cough; or sore throat  signs and symptoms of kidney injury like trouble passing urine or change in the amount of urine  stomach or side pain, or pain at the shoulder  sweating  swelling of the legs, ankles, or abdomen  tiredness Side effects that usually do not require medical attention (report to your doctor or health care professional if they continue or are bothersome):  bone pain  diarrhea  headache  muscle pain  vomiting This list may   not describe all possible side effects. Call your doctor for medical advice about side effects. You may report side effects to FDA at 1-800-FDA-1088. Where should I keep my medicine? Keep out of the reach of children. Store in a refrigerator between 2 and 8 degrees C (36 and 46 degrees F). Keep in carton to protect from light. Throw  away this medicine if it is left out of the refrigerator for more than 5 consecutive days. Throw away any unused medicine after the expiration date. NOTE: This sheet is a summary. It may not cover all possible information. If you have questions about this medicine, talk to your doctor, pharmacist, or health care provider.  2020 Elsevier/Gold Standard (2018-07-18 19:58:39)  

## 2019-09-09 NOTE — Telephone Encounter (Signed)
Received vm call form Jeff/[facility] asking for verbal order for PT every week x 2 weeks to establish home exercise program.  Message routed to Dr [name]/pod RN

## 2019-09-10 ENCOUNTER — Other Ambulatory Visit: Payer: Self-pay | Admitting: *Deleted

## 2019-09-10 ENCOUNTER — Other Ambulatory Visit: Payer: Self-pay

## 2019-09-10 ENCOUNTER — Inpatient Hospital Stay: Payer: Medicaid Other

## 2019-09-10 VITALS — BP 122/72 | HR 78 | Temp 98.2°F | Resp 18

## 2019-09-10 DIAGNOSIS — D701 Agranulocytosis secondary to cancer chemotherapy: Secondary | ICD-10-CM

## 2019-09-10 DIAGNOSIS — T451X5A Adverse effect of antineoplastic and immunosuppressive drugs, initial encounter: Secondary | ICD-10-CM

## 2019-09-10 DIAGNOSIS — C9110 Chronic lymphocytic leukemia of B-cell type not having achieved remission: Secondary | ICD-10-CM | POA: Diagnosis not present

## 2019-09-10 MED ORDER — FILGRASTIM-SNDZ 300 MCG/0.5ML IJ SOSY
300.0000 ug | PREFILLED_SYRINGE | Freq: Once | INTRAMUSCULAR | Status: AC
  Administered 2019-09-10: 300 ug via SUBCUTANEOUS

## 2019-09-10 MED ORDER — FILGRASTIM-SNDZ 300 MCG/0.5ML IJ SOSY
PREFILLED_SYRINGE | INTRAMUSCULAR | Status: AC
  Filled 2019-09-10: qty 0.5

## 2019-09-10 MED ORDER — HYDROXYZINE HCL 10 MG PO TABS: 10.0000 mg | ORAL_TABLET | Freq: Three times a day (TID) | ORAL | 1 refills | Status: DC | PRN

## 2019-09-10 MED ORDER — OMEPRAZOLE 20 MG PO CPDR: 20.0000 mg | DELAYED_RELEASE_CAPSULE | Freq: Every day | ORAL | 3 refills | Status: DC

## 2019-09-16 ENCOUNTER — Other Ambulatory Visit: Payer: Self-pay | Admitting: Physician Assistant

## 2019-09-16 ENCOUNTER — Other Ambulatory Visit: Payer: Medicaid Other

## 2019-09-16 ENCOUNTER — Telehealth: Payer: Self-pay | Admitting: *Deleted

## 2019-09-16 DIAGNOSIS — C8598 Non-Hodgkin lymphoma, unspecified, lymph nodes of multiple sites: Secondary | ICD-10-CM

## 2019-09-16 MED ORDER — PROCHLORPERAZINE MALEATE 10 MG PO TABS: 10.0000 mg | ORAL_TABLET | Freq: Four times a day (QID) | ORAL | 2 refills | Status: DC | PRN

## 2019-09-16 MED ORDER — LIDOCAINE-PRILOCAINE 2.5-2.5 % EX CREA: 1.0000 "application " | TOPICAL_CREAM | CUTANEOUS | 0 refills | Status: DC | PRN

## 2019-09-16 MED FILL — LIDOCAINE-PRILOCAINE CREAM: 2.5-2.5 | 30 days supply | Qty: 30 | Fill #0

## 2019-09-16 MED FILL — PROCHLORPERAZINE 10 MG TAB: 10 | 7 days supply | Qty: 30 | Fill #0

## 2019-09-16 NOTE — Telephone Encounter (Signed)
Sister called. States Isaac Pierce does not feel well and would like to reschedule today's appts.   Pain started last night around 8:00 in left lower side/in rib area. " Breathing in hurts, took 2 tramadol but they did not help"  States he is flushed and pulse is 104.

## 2019-09-16 NOTE — Telephone Encounter (Signed)
Spoke with sister, pt is to be scheduled to for labs and see Sandi Mealy, PA  tomorrow. Has to bring remaining Tramadol with him in order for new pain medicine to be prescribed. Lucianne Lei will evaluate pain in left side.

## 2019-09-16 NOTE — Telephone Encounter (Signed)
[facility] tomorrow.

## 2019-09-17 ENCOUNTER — Other Ambulatory Visit: Payer: Self-pay

## 2019-09-17 ENCOUNTER — Inpatient Hospital Stay: Payer: Medicaid Other

## 2019-09-17 ENCOUNTER — Other Ambulatory Visit: Payer: Self-pay | Admitting: Physician Assistant

## 2019-09-17 ENCOUNTER — Inpatient Hospital Stay (HOSPITAL_BASED_OUTPATIENT_CLINIC_OR_DEPARTMENT_OTHER): Payer: Medicaid Other | Admitting: Medical

## 2019-09-17 VITALS — BP 108/61 | HR 115 | Temp 98.0°F | Resp 20 | Ht 71.0 in | Wt 133.8 lb

## 2019-09-17 DIAGNOSIS — C8300 Small cell B-cell lymphoma, unspecified site: Secondary | ICD-10-CM | POA: Diagnosis not present

## 2019-09-17 DIAGNOSIS — D701 Agranulocytosis secondary to cancer chemotherapy: Secondary | ICD-10-CM

## 2019-09-17 DIAGNOSIS — R1012 Left upper quadrant pain: Secondary | ICD-10-CM | POA: Diagnosis not present

## 2019-09-17 DIAGNOSIS — C8308 Small cell B-cell lymphoma, lymph nodes of multiple sites: Secondary | ICD-10-CM

## 2019-09-17 DIAGNOSIS — C9110 Chronic lymphocytic leukemia of B-cell type not having achieved remission: Secondary | ICD-10-CM | POA: Diagnosis not present

## 2019-09-17 LAB — CBC WITH DIFFERENTIAL (CANCER CENTER ONLY)
Abs Immature Granulocytes: 0.09 10*3/uL — ABNORMAL HIGH (ref 0.00–0.07)
Basophils Absolute: 0 10*3/uL (ref 0.0–0.1)
Basophils Relative: 1 %
Eosinophils Absolute: 0 10*3/uL (ref 0.0–0.5)
Eosinophils Relative: 0 %
HCT: 28.4 % — ABNORMAL LOW (ref 39.0–52.0)
Hemoglobin: 9.4 g/dL — ABNORMAL LOW (ref 13.0–17.0)
Immature Granulocytes: 3 %
Lymphocytes Relative: 8 %
Lymphs Abs: 0.3 10*3/uL — ABNORMAL LOW (ref 0.7–4.0)
MCH: 28.7 pg (ref 26.0–34.0)
MCHC: 33.1 g/dL (ref 30.0–36.0)
MCV: 86.9 fL (ref 80.0–100.0)
Monocytes Absolute: 0.5 10*3/uL (ref 0.1–1.0)
Monocytes Relative: 12 %
Neutro Abs: 2.8 10*3/uL (ref 1.7–7.7)
Neutrophils Relative %: 76 %
Platelet Count: 87 10*3/uL — ABNORMAL LOW (ref 150–400)
RBC: 3.27 MIL/uL — ABNORMAL LOW (ref 4.22–5.81)
RDW: 17.2 % — ABNORMAL HIGH (ref 11.5–15.5)
WBC Count: 3.7 10*3/uL — ABNORMAL LOW (ref 4.0–10.5)
nRBC: 0 % (ref 0.0–0.2)

## 2019-09-17 LAB — CMP (CANCER CENTER ONLY)
ALT: 12 U/L (ref 0–44)
AST: 18 U/L (ref 15–41)
Albumin: 2.7 g/dL — ABNORMAL LOW (ref 3.5–5.0)
Alkaline Phosphatase: 136 U/L — ABNORMAL HIGH (ref 38–126)
Anion gap: 14 (ref 5–15)
BUN: 13 mg/dL (ref 6–20)
CO2: 25 mmol/L (ref 22–32)
Calcium: 8.3 mg/dL — ABNORMAL LOW (ref 8.9–10.3)
Chloride: 94 mmol/L — ABNORMAL LOW (ref 98–111)
Creatinine: 0.67 mg/dL (ref 0.61–1.24)
GFR, Est AFR Am: >60 mL/min (ref >60)
GFR, Estimated: >60 mL/min (ref >60)
Glucose, Bld: 102 mg/dL — ABNORMAL HIGH (ref 70–99)
Potassium: 4.5 mmol/L (ref 3.5–5.1)
Sodium: 133 mmol/L — ABNORMAL LOW (ref 135–145)
Total Bilirubin: 0.5 mg/dL (ref 0.3–1.2)
Total Protein: 5.2 g/dL — ABNORMAL LOW (ref 6.5–8.1)

## 2019-09-17 LAB — LACTATE DEHYDROGENASE: LDH: 367 U/L — ABNORMAL HIGH (ref 98–192)

## 2019-09-17 MED ORDER — PREDNISONE 50 MG PO TABS: ORAL_TABLET | ORAL | 2 refills | Status: DC

## 2019-09-17 MED ORDER — HYDROCODONE-ACETAMINOPHEN 5-325 MG PO TABS: ORAL_TABLET | ORAL | 0 refills | Status: DC

## 2019-09-17 MED FILL — HYDROCODON-APAP 5-325: 5-325 | 7 days supply | Qty: 30 | Fill #0

## 2019-09-17 MED FILL — predniSONE 50 MG TABS: 50 | 30 days supply | Qty: 30 | Fill #0

## 2019-09-17 NOTE — Patient Instructions (Signed)
Constipation Management  Magnesium Citrate, drink 1/2 bottle, drink remainder if no bowel movement with 30 to 60 minutes  Or  30 mg (1 tablespoon) of Milk of Magnesia in 8 ounces of prune juice, warm in microwave for 20 seconds    Begin the following after you have had a bowel movement:  Senna-S, 1 to 2 tablets twice daily  MiraLAX 17 grams in 8 ounces of liquids 1 to 2 times daily as needed   Remember to remain well hydrated. Drink, Drink, Drink non-caffeinated beverages.   Adjust these medications based on your response. If your bowel movements become too loose then decrease the amount of Senna-S and/or MiraLAX that you are using. If your bowel movements become too firm or are difficult to pass, the increase the amount of Senna-S and/or MiraLAX that you are using and increase your intake of water.   NEVER, NEVER, NEVER use an enema or suppositories unless your provider has given their approval.                   

## 2019-09-20 ENCOUNTER — Inpatient Hospital Stay: Payer: Medicaid Other

## 2019-09-20 ENCOUNTER — Telehealth: Payer: Self-pay | Admitting: *Deleted

## 2019-09-21 ENCOUNTER — Inpatient Hospital Stay: Payer: Medicaid Other

## 2019-09-21 ENCOUNTER — Other Ambulatory Visit: Payer: Self-pay

## 2019-09-21 ENCOUNTER — Inpatient Hospital Stay (HOSPITAL_BASED_OUTPATIENT_CLINIC_OR_DEPARTMENT_OTHER): Payer: Medicaid Other | Admitting: Physician Assistant

## 2019-09-21 ENCOUNTER — Encounter: Payer: Self-pay | Admitting: Physician Assistant

## 2019-09-21 VITALS — BP 108/64 | HR 114 | Temp 98.9°F | Resp 17 | Ht 71.0 in | Wt 132.3 lb

## 2019-09-21 DIAGNOSIS — C8598 Non-Hodgkin lymphoma, unspecified, lymph nodes of multiple sites: Secondary | ICD-10-CM

## 2019-09-21 DIAGNOSIS — C9110 Chronic lymphocytic leukemia of B-cell type not having achieved remission: Secondary | ICD-10-CM | POA: Diagnosis not present

## 2019-09-21 DIAGNOSIS — D701 Agranulocytosis secondary to cancer chemotherapy: Secondary | ICD-10-CM

## 2019-09-21 DIAGNOSIS — Z5111 Encounter for antineoplastic chemotherapy: Secondary | ICD-10-CM

## 2019-09-21 DIAGNOSIS — C8308 Small cell B-cell lymphoma, lymph nodes of multiple sites: Secondary | ICD-10-CM

## 2019-09-21 LAB — CMP (CANCER CENTER ONLY)
ALT: 14 U/L (ref 0–44)
AST: 23 U/L (ref 15–41)
Albumin: 2.7 g/dL — ABNORMAL LOW (ref 3.5–5.0)
Alkaline Phosphatase: 132 U/L — ABNORMAL HIGH (ref 38–126)
Anion gap: 11 (ref 5–15)
BUN: 11 mg/dL (ref 6–20)
CO2: 28 mmol/L (ref 22–32)
Calcium: 8.3 mg/dL — ABNORMAL LOW (ref 8.9–10.3)
Chloride: 97 mmol/L — ABNORMAL LOW (ref 98–111)
Creatinine: 0.64 mg/dL (ref 0.61–1.24)
GFR, Est AFR Am: >60 mL/min (ref >60)
GFR, Estimated: >60 mL/min (ref >60)
Glucose, Bld: 99 mg/dL (ref 70–99)
Potassium: 4.6 mmol/L (ref 3.5–5.1)
Sodium: 136 mmol/L (ref 135–145)
Total Bilirubin: 0.5 mg/dL (ref 0.3–1.2)
Total Protein: 5.1 g/dL — ABNORMAL LOW (ref 6.5–8.1)

## 2019-09-21 LAB — CBC WITH DIFFERENTIAL (CANCER CENTER ONLY)
Abs Immature Granulocytes: 0.05 10*3/uL (ref 0.00–0.07)
Basophils Absolute: 0 10*3/uL (ref 0.0–0.1)
Basophils Relative: 1 %
Eosinophils Absolute: 0 10*3/uL (ref 0.0–0.5)
Eosinophils Relative: 0 %
HCT: 26.9 % — ABNORMAL LOW (ref 39.0–52.0)
Hemoglobin: 8.9 g/dL — ABNORMAL LOW (ref 13.0–17.0)
Immature Granulocytes: 2 %
Lymphocytes Relative: 9 %
Lymphs Abs: 0.3 10*3/uL — ABNORMAL LOW (ref 0.7–4.0)
MCH: 28.7 pg (ref 26.0–34.0)
MCHC: 33.1 g/dL (ref 30.0–36.0)
MCV: 86.8 fL (ref 80.0–100.0)
Monocytes Absolute: 0.4 10*3/uL (ref 0.1–1.0)
Monocytes Relative: 12 %
Neutro Abs: 2.6 10*3/uL (ref 1.7–7.7)
Neutrophils Relative %: 76 %
Platelet Count: 91 10*3/uL — ABNORMAL LOW (ref 150–400)
RBC: 3.1 MIL/uL — ABNORMAL LOW (ref 4.22–5.81)
RDW: 18.3 % — ABNORMAL HIGH (ref 11.5–15.5)
WBC Count: 3.4 10*3/uL — ABNORMAL LOW (ref 4.0–10.5)
nRBC: 0 % (ref 0.0–0.2)

## 2019-09-21 LAB — URIC ACID: Uric Acid, Serum: 5.1 mg/dL (ref 3.7–8.6)

## 2019-09-21 LAB — LACTATE DEHYDROGENASE: LDH: 478 U/L — ABNORMAL HIGH (ref 98–192)

## 2019-09-21 NOTE — Progress Notes (Signed)
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  PRIOR THERAPY: The patient was tried on single agent Rituxan during his hospitalization in December 2020 discontinued secondary to significant hypersensitivity reaction.  CURRENT THERAPY: Systemic chemotherapy with CHOP, status post 1 cycle.  First cycle was given on September 01, 2019.  INTERVAL HISTORY: Isaac Pierce 58 y.o. male returns to the clinic for a follow-up visit.  The patient is feeling fairly well today without any concerning complaints.  The patient was seen last week in the symptom management clinic for left upper quadrant pain secondary to his splenomegaly.  He was prescribed Norco for his pain which is been helping alleviate his pain to a tolerable level. The patient also has been taking Tylenol as well. The patient is currently undergoing treatment with R-CHOP.  However, Rituxan was removed from his treatment plan after a significant hypersensitivity reaction while receiving cycle #1. The patient tolerated CHOP well with significant improvement in his lymphadenopathy.  The patient still reports intermittent fevers; however, he states it is improved from prior.  The patient states that he no longer experiences night sweats.  He has progressively lost a few pounds over the course of the last several weeks.  He notes palpable lymphadenopathy in the left axillary region but states that this is improved from prior.  He denies any chest pain, shortness of breath, cough, or palpitations.  He denies any recent signs and symptoms of infection including nasal congestion, sore throat, dysuria, diarrhea, or skin infections.  He denies any bleeding or bruising including epistaxis, gingival bleeding, hematochezia, hematuria, or melena.  He notes he experiences some nausea without  vomiting.  He has been experiencing constipation which is improved from prior after he started taking stool softeners. He is here today for evaluation before starting cycle #2 of CHOP tomorrow as scheduled.  MEDICAL HISTORY: Past Medical History:  Diagnosis Date  . Anxiety   . Back pain   . GERD (gastroesophageal reflux disease)     ALLERGIES:  is allergic to rituximab.  MEDICATIONS:  Current Outpatient Medications  Medication Sig Dispense Refill  . albuterol (VENTOLIN HFA) 108 (90 Base) MCG/ACT inhaler Inhale 2 puffs into the lungs every 6 (six) hours as needed for wheezing or shortness of breath. 6.7 g 0  . allopurinol (ZYLOPRIM) 300 MG tablet Take 1 tablet (300 mg total) by mouth daily. 30 tablet 0  . folic acid (FOLVITE) 1 MG tablet Take 1 tablet (1 mg total) by mouth daily. 30 tablet 0  . furosemide (LASIX) 20 MG tablet Take 1 tablet (20 mg total) by mouth daily. 30 tablet 0  . HYDROcodone-acetaminophen (NORCO) 5-325 MG tablet 1/2 to 1 tab PO q 6 hours prn pain 30 tablet 0  . hydrocortisone (ANUSOL-HC) 2.5 % rectal cream Place rectally 3 (three) times daily. 30 g 0  . hydrOXYzine (ATARAX/VISTARIL) 10 MG tablet Take 1 tablet (10 mg total) by mouth every 8 (eight) hours as needed. 30 tablet 1  . lidocaine-prilocaine (EMLA) cream Apply 1 application topically as needed. 30 g 0  . nicotine (NICODERM CQ - DOSED IN MG/24 HR) 7 mg/24hr patch Place 1 patch (7 mg total) onto the skin daily. 28 patch 0  . omeprazole (PRILOSEC) 20 MG capsule Take 1 capsule (20 mg total) by mouth daily. 30 capsule 3  . predniSONE (DELTASONE) 50 MG tablet Take 1  tablet by mouth daily for 5 days starting on days when chemotherapy is given 30 tablet 2  . prochlorperazine (COMPAZINE) 10 MG tablet Take 1 tablet (10 mg total) by mouth every 6 (six) hours as needed for nausea or vomiting. 30 tablet 2  . traMADol (ULTRAM) 50 MG tablet Take 1 tablet (50 mg total) by mouth every 6 (six) hours as needed for moderate pain.  20 tablet 0   No current facility-administered medications for this visit.    SURGICAL HISTORY:  Past Surgical History:  Procedure Laterality Date  . IR IMAGING GUIDED PORT INSERTION  08/30/2019    REVIEW OF SYSTEMS:   Review of Systems  Constitutional: Positive for weight loss. Negative for appetite change, chills, fatigue, and fever.  HENT: Negative for mouth sores, nosebleeds, sore throat and trouble swallowing.   Eyes: Negative for eye problems and icterus.  Respiratory: Negative for cough, hemoptysis, shortness of breath and wheezing.   Cardiovascular: Negative for chest pain and leg swelling.  Gastrointestinal: Positive for LUQ pain (improved) and constipation (improved). Negative for diarrhea, nausea and vomiting.  Genitourinary: Negative for bladder incontinence, difficulty urinating, dysuria, frequency and hematuria.   Musculoskeletal: Negative for back pain, gait problem, neck pain and neck stiffness.  Skin: Negative for itching and rash.  Neurological: Negative for dizziness, extremity weakness, gait problem, headaches, light-headedness and seizures.  Hematological: Positive for left axillary lymphadenopathy. Does not bruise/bleed easily.  Psychiatric/Behavioral: Negative for confusion, depression and sleep disturbance. The patient is not nervous/anxious.     PHYSICAL EXAMINATION:  Blood pressure 108/64, pulse (!) 114, temperature 98.9 F (37.2 C), temperature source Temporal, resp. rate 17, height '5\' 11"'  (1.803 m), weight 132 lb 4.8 oz (60 kg), SpO2 99 %.  ECOG PERFORMANCE STATUS: 1 - Symptomatic but completely ambulatory  Physical Exam  Constitutional: Oriented to person, place, and time and thin-appearing male and in no distress.  HENT:  Head: Normocephalic and atraumatic.  Mouth/Throat: Oropharynx is clear and moist. No oropharyngeal exudate.  Eyes: Conjunctivae are normal. Right eye exhibits no discharge. Left eye exhibits no discharge. No scleral icterus.   Neck: Normal range of motion. Neck supple.  Cardiovascular: Normal rate, regular rhythm, normal heart sounds and intact distal pulses.   Pulmonary/Chest: Effort normal and breath sounds normal. No respiratory distress. No wheezes. No rales.  Abdominal: Splenomegaly noted. Soft. Bowel sounds are normal. There is no tenderness.  Musculoskeletal: Normal range of motion. Exhibits no edema.  Lymphadenopathy:    Left axillary lymphadenopathy.  Neurological: Alert and oriented to person, place, and time. Exhibits normal muscle tone. Gait normal. Coordination normal.  Skin: Skin is warm and dry. No rash noted. Not diaphoretic. No erythema. No pallor.  Psychiatric: Mood, memory and judgment normal.  Vitals reviewed.  LABORATORY DATA: Lab Results  Component Value Date   WBC 3.4 (L) 09/21/2019   HGB 8.9 (L) 09/21/2019   HCT 26.9 (L) 09/21/2019   MCV 86.8 09/21/2019   PLT 91 (L) 09/21/2019      Chemistry      Component Value Date/Time   NA 136 09/21/2019 1453   K 4.6 09/21/2019 1453   CL 97 (L) 09/21/2019 1453   CO2 28 09/21/2019 1453   BUN 11 09/21/2019 1453   CREATININE 0.64 09/21/2019 1453      Component Value Date/Time   CALCIUM 8.3 (L) 09/21/2019 1453   ALKPHOS 132 (H) 09/21/2019 1453   AST 23 09/21/2019 1453   ALT 14 09/21/2019 1453   BILITOT 0.5 09/21/2019  1453       RADIOGRAPHIC STUDIES:  DG Chest 1 View  Result Date: 09/01/2019 CLINICAL DATA:  Shortness of breath. Oxygen desaturation. Chest pain. EXAM: CHEST  1 VIEW COMPARISON:  Radiograph 08/23/2019, CT 08/25/2019 FINDINGS: Patient is rotated. Right chest port with tip in the lower SVC. Increase in bilateral pleural effusions from prior exam. Cardiomegaly which may have increased versus rotation. Increased interstitial thickening suspicious for pulmonary edema. No visualized pneumothorax. Nodularity in thoracic adenopathy seen on chest CT, not well demonstrated radiographically. IMPRESSION: 1. Increase in bilateral  pleural effusions. Increased interstitial thickening consistent with pulmonary edema. Overall findings suggest increased fluid overload. 2. Rotated exam. Cardiomegaly with questionable progression versus rotation. Electronically Signed   By: Keith Rake M.D.   On: 09/01/2019 17:58   CT ANGIO CHEST PE W OR WO CONTRAST  Result Date: 08/25/2019 CLINICAL DATA:  Tachycardia. 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 EXAM: CT ANGIOGRAPHY CHEST WITH CONTRAST TECHNIQUE: Multidetector CT imaging of the chest was performed using the standard protocol during bolus administration of intravenous contrast. Multiplanar CT image reconstructions and MIPs were obtained to evaluate the vascular anatomy. CONTRAST:  164m OMNIPAQUE IOHEXOL 350 MG/ML SOLN COMPARISON:  Chest radiograph dated 08/23/2019. CTA chest dated 08/17/2019. FINDINGS: Cardiovascular: Evaluation is constrained by respiratory motion, particularly in the bilateral lower lobes. Within that constraint, there is no evidence of pulmonary embolism to the segmental level. No evidence of thoracic aortic aneurysm or dissection. The heart is normal in size. Moderate pericardial soft tissue/metastases inferiorly (series 4/image 73), unchanged, likely related to the patient's known lymphoproliferative disorder. Mediastinum/Nodes: Bulky thoracic lymphadenopathy, related to the patient's known lymphoproliferative disorder, including: --2.3 cm short axis high right paratracheal node (series 4/image 28) --3.0 cm short axis AP window node (series 4/image 35) --1.4 cm short axis right hilar node (series 4/image 42) --2.6 cm short axis subcarinal node (series 4/image 43) --2.1 cm short axis intralobar/right lower lobe node (series 4/image 55) Visualized thyroid is unremarkable. Lungs/Pleura: Small bilateral pulmonary nodules, most of which are pleural/subpleural, unchanged from recent CT, including a dominant 9 mm nodule in the posterior right lower lobe (series 6/image 36). Dominant  central pulmonary nodule measures 8 mm in the left lower lobe (series 6/image 78). These findings are likely related to the patient's known lymphoma. Moderate left pleural effusion, stable versus mildly increased, partially loculated. Trace right pleural effusion, mildly decreased. Associated bilateral lower lobe atelectasis. No pneumothorax. Upper Abdomen: Visualized upper abdomen is notable for a small hiatal hernia, masses splenomegaly, and a splenic infarct, better evaluated on recent CT abdomen/pelvis. Musculoskeletal: Degenerative changes of the visualized thoracolumbar spine. Review of the MIP images confirms the above findings. IMPRESSION: No evidence of pulmonary embolism. Bulky thoracic lymphadenopathy, pericardial thickening/soft tissue, and bilateral pulmonary nodules, likely related to the patient's known B-cell lymphoma, grossly unchanged. Moderate left pleural effusion, stable versus mildly increased, partially loculated. Trace right pleural effusion, mildly decreased. Associated bilateral lower lobe atelectasis. Overall, there is no significant interval change from recent CTs. Electronically Signed   By: SJulian HyM.D.   On: 08/25/2019 22:00   DG CHEST PORT 1 VIEW  Result Date: 08/23/2019 CLINICAL DATA:  Shortness of breath EXAM: PORTABLE CHEST 1 VIEW COMPARISON:  Radiograph 08/22/2019 FINDINGS: Mild left anterior obliquity. Redemonstration of a 9 mm nodule in the right mid lung, seen on comparison CT. Stable bandlike areas of atelectasis and/or scarring. Additional hazy basilar atelectatic changes are present. No significant reaccumulation of the left effusion. No right effusion. Cardiomediastinal contours  are stable. No acute osseous or soft tissue abnormality. IMPRESSION: 1. Unchanged 9 mm nodule in the right mid lung, seen on comparison CT. 2. No significant reaccumulation of the left effusion post thoracentesis. 3. Stable bandlike areas of atelectasis and/or scarring. Electronically  Signed   By: Lovena Le M.D.   On: 08/23/2019 06:00   IR IMAGING GUIDED PORT INSERTION  Result Date: 08/30/2019 INDICATION: Recent diagnosis of B cell lymphoma. In need of durable intravenous access for chemotherapy administration. EXAM: IMPLANTED PORT A CATH PLACEMENT WITH ULTRASOUND AND FLUOROSCOPIC GUIDANCE COMPARISON:  Chest CT - 08/25/2019 MEDICATIONS: Ancef 2 gm IV; The antibiotic was administered within an appropriate time interval prior to skin puncture. ANESTHESIA/SEDATION: Moderate (conscious) sedation was employed during this procedure. A total of Versed 2 mg and Fentanyl 100 mcg was administered intravenously. Moderate Sedation Time: 23 minutes. The patient's level of consciousness and vital signs were monitored continuously by radiology nursing throughout the procedure under my direct supervision. CONTRAST:  None FLUOROSCOPY TIME:  18 seconds (4.6 mGy) COMPLICATIONS: None immediate. PROCEDURE: The procedure, risks, benefits, and alternatives were explained to the patient. Questions regarding the procedure were encouraged and answered. The patient understands and consents to the procedure. The right neck and chest were prepped with chlorhexidine in a sterile fashion, and a sterile drape was applied covering the operative field. Maximum barrier sterile technique with sterile gowns and gloves were used for the procedure. A timeout was performed prior to the initiation of the procedure. Local anesthesia was provided with 1% lidocaine with epinephrine. After creating a small venotomy incision, a micropuncture kit was utilized to access the internal jugular vein. Real-time ultrasound guidance was utilized for vascular access including the acquisition of a permanent ultrasound image documenting patency of the accessed vessel. The microwire was utilized to measure appropriate catheter length. A subcutaneous port pocket was then created along the upper chest wall utilizing a combination of sharp and blunt  dissection. The pocket was irrigated with sterile saline. A single lumen ISP power injectable port was chosen for placement. The 8 Fr catheter was tunneled from the port pocket site to the venotomy incision. The port was placed in the pocket. The external catheter was trimmed to appropriate length. At the venotomy, an 8 Fr peel-away sheath was placed over a guidewire under fluoroscopic guidance. The catheter was then placed through the sheath and the sheath was removed. Final catheter positioning was confirmed and documented with a fluoroscopic spot radiograph. The port was accessed with a Huber needle, aspirated and flushed with heparinized saline. The venotomy site was closed with an interrupted 4-0 Vicryl suture. The port pocket incision was closed with interrupted 2-0 Vicryl suture and the skin was opposed with a running subcuticular 4-0 Vicryl suture. Dermabond and Steri-strips were applied to both incisions. Dressings were placed. The patient tolerated the procedure well without immediate post procedural complication. FINDINGS: After catheter placement, the tip lies within the superior cavoatrial junction. The catheter aspirates and flushes normally and is ready for immediate use. IMPRESSION: Successful placement of a right internal jugular approach power injectable Port-A-Cath. The catheter is ready for immediate use. Electronically Signed   By: Sandi Mariscal M.D.   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  He also  had significant pancytopenia on presentation.  He was admitted to the hospital recently with sepsis that was treated.  The patient received the first cycle of his systemic chemotherapy with CHOP in the hospital on September 01, 2019  and he is feeling much better.  He was given trial of single agent Rituxan as well as Rituxan with CHOP but has significant hypersensitivity reaction with severe shortness of breath, diaphoresis and chest pain.  The treatment with Rituxan was discontinued.  Patient was seen with Dr. Earlie Server today.  Labs reviewed. There is improvement in his blood counts today. We recommend that the patient proceed with cycle #2 of CHOP tomorrow as scheduled.   We will see the patient back for follow-up visit in 3 weeks for evaluation before starting cycle #3.  The patient will continue using his Norco and Tylenol for pain control.  I discussed that Norco contains Tylenol and to be conscientious not to exceed the daily dose of Tylenol.  He expressed understanding. He will continue take a stool softener for constipation prevention.   Discussed with the patient to begin taking his prednisone tomorrow and continue taking 1 tablet daily for 5 days.   The patient was advised to call immediately if he has any concerning symptoms in the interval. The patient voices understanding of current disease status and treatment options and is in agreement with the current care plan. All questions were answered. The patient knows to call the clinic with any problems, questions or concerns. We can certainly see the patient much sooner if necessary   No orders of the defined types were placed in this encounter.    Aviva Wolfer L Alontae Chaloux, PA-C 09/21/19  ADDENDUM: Hematology/Oncology Attending: I had a face-to-face encounter with the patient today.  I recommended his care plan.  This is a very pleasant 58 years old white male who was recently diagnosed with stage IV bulky non-Hodgkin's lymphoma, CLL/small lymphocytic lymphoma with bulky lymphadenopathy in the chest, abdomen and pelvis as well as hepatosplenomegaly and bone marrow involvement.  The patient has hypersensitivity reaction to Rituxan.  This was discontinued. He  received 1 dose of chemotherapy with CHOP during his hospitalization and he tolerated the treatment well.  He had improvement in his blood count on the recent blood work. I recommended for the patient to proceed with cycle #2 of CHOP tomorrow as planned. We will see the patient back for follow-up visit in 3 weeks for evaluation before starting cycle #3.  He will have imaging studies after cycle #3. The patient will continue his current treatment with allopurinol for the tumor lysis. For pain management he will continue his treatment with Norco and Tylenol. The patient was advised to call immediately if he has any concerning symptoms in the interval.  Disclaimer: This note was dictated with voice recognition software. Similar sounding words can inadvertently be transcribed and may be missed upon review. Eilleen Kempf, MD 09/21/19

## 2019-09-21 NOTE — Progress Notes (Signed)
[facility] Progress Note   Isaac Pierce [id number] October 12, 1960 58 y.o.  Isaac Pierce is managed by Dr. Fanny Bien. 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  Information regarding a bowel regiment was given to the patient.  Small lymphocytic lymphoma: The patient is status post cycle 1 of CHOP which was dosed on 09/01/2019.  Rituximab was discontinued due to hypersensitivity.  He will return to clinic on 09/22/2019 for consideration of cycle 2 of CHOP.  Please see After Visit Summary for patient specific instructions.  Future Appointments  Date Time Provider [place]  09/22/2019 11:30 AM [facility] INFUSION [facility] None  10/06/2019  9:00 AM Azzie Glatter, FNP [facility] None    No orders of the defined types were placed in this encounter.      Subjective:   Patient ID:  Isaac Pierce is a 58 y.o. (DOB 1961-04-18) male.  Chief Complaint: No chief complaint on file.   HPI [name]  Is a 58 y.o. male with a diagnosis of a small lymphocytic lymphoma.  He is managed by Dr. Fanny Bien. [name] and is status post cycle 1 of CHOP which was dosed on 09/01/2019.  He had a significant reaction to rituximab which necessitated dropping this from his treatment plan due to hypersensitivity.  He presents to the clinic today with significant left upper quadrant pain along the left inferior costal margin which is increased in  severity over the past 2 days.  He also reports some left superior shoulder pain.  His pain is increased with deep breathing.  He denies any changes in activity, trauma, nausea, vomiting, constipation, diarrhea, fevers, chills, or sweats.  He reports that his pain is improved despite his use of tramadol.  Medications: I have reviewed the patient's current medications.  Allergies:  Allergies  Allergen Reactions  . Rituximab Rash    Had rash, anxiety, HTN & tachycardia ~37mn into 1st infusion 08/27/19    Past Medical History:  Diagnosis Date  . Anxiety   . Back pain   . GERD (gastroesophageal reflux disease)     Past Surgical History:  Procedure Laterality Date  . IR IMAGING GUIDED PORT INSERTION  08/30/2019    Family History  Problem Relation Age of Onset  . Lymphoma Mother   . Lymphoma Father     Social History   Socioeconomic History  . Marital status: Single    Spouse name: Not on file  . Number of children: Not on file  . Years of education: Not on file  . Highest education level: Not on file  Occupational History  . Not on file  Tobacco Use  . Smoking status: Current Every Day Smoker    Types: Cigarettes  . Smokeless tobacco: Never Used  Substance and Sexual Activity  . Alcohol use: Not Currently  . Drug use: Not Currently  . Sexual activity: Not on file  Other Topics Concern  . Not on file  Social History Narrative  . Not on file  Social Determinants of Health   Financial Resource Strain:   . Difficulty of Paying Living Expenses: Not on file  Food Insecurity:   . Worried About Charity fundraiser in the Last Year: Not on file  . Ran Out of Food in the Last Year: Not on file  Transportation Needs:   . Lack of Transportation (Medical): Not on file  . Lack of Transportation (Non-Medical): Not on file  Physical Activity:   . Days of Exercise per Week: Not on file  . Minutes of Exercise per Session: Not on file  Stress:   . Feeling of Stress : Not  on file  Social Connections:   . Frequency of Communication with Friends and Family: Not on file  . Frequency of Social Gatherings with Friends and Family: Not on file  . Attends Religious Services: Not on file  . Active Member of Clubs or Organizations: Not on file  . Attends Archivist Meetings: Not on file  . Marital Status: Not on file  Intimate Partner Violence:   . Fear of Current or Ex-Partner: Not on file  . Emotionally Abused: Not on file  . Physically Abused: Not on file  . Sexually Abused: Not on file    Past Medical History, Surgical history, Social history, and Family history were reviewed and updated as appropriate.   Please see review of systems for further details on the patient's review from today.   Review of Systems:  Review of Systems  Constitutional: Negative for activity change, appetite change, chills, diaphoresis and fever.  HENT: Negative for trouble swallowing and voice change.   Respiratory: Negative for cough, chest tightness, shortness of breath and wheezing.   Cardiovascular: Negative for chest pain, palpitations and leg swelling.  Gastrointestinal: Positive for abdominal pain. Negative for abdominal distention, constipation, diarrhea, nausea and vomiting.  Musculoskeletal: Negative for back pain and myalgias.  Neurological: Negative for dizziness, light-headedness and headaches.    Objective:   Physical Exam:  BP 108/61 (BP Location: Left Arm, Patient Position: Sitting)   Pulse (!) 115 Comment: notified nurse  Temp 98 F (36.7 C) (Temporal)   Resp 20   Ht '5\' 11"'  (1.803 m)   Wt 133 lb 12.8 oz (60.7 kg)   SpO2 98%   BMI 18.66 kg/m  ECOG: 1  Physical Exam Constitutional:      General: He is not in acute distress.    Appearance: He is not diaphoretic.  HENT:     Head: Normocephalic and atraumatic.  Eyes:     General: No scleral icterus.       Right eye: No discharge.        Left eye: No discharge.  Cardiovascular:     Rate  and Rhythm: Regular rhythm. Tachycardia present.     Heart sounds: Normal heart sounds. No murmur. No friction rub. No gallop.   Pulmonary:     Effort: Pulmonary effort is normal. No respiratory distress.     Breath sounds: Normal breath sounds. No wheezing or rales.  Abdominal:     General: Abdomen is flat. Bowel sounds are normal. There is no distension.     Palpations: Abdomen is soft.     Tenderness: There is abdominal tenderness in the left upper quadrant. There is no guarding.    Skin:    General: Skin is warm and dry.     Findings: No erythema or rash.  Neurological:     Mental Status: He is alert.  Coordination: Coordination normal.     Gait: Gait normal.  Psychiatric:        Mood and Affect: Mood normal.        Behavior: Behavior normal.        Thought Content: Thought content normal.        Judgment: Judgment normal.     Lab Review:     Component Value Date/Time   NA 136 09/21/2019 1453   K 4.6 09/21/2019 1453   CL 97 (L) 09/21/2019 1453   CO2 28 09/21/2019 1453   GLUCOSE 99 09/21/2019 1453   BUN 11 09/21/2019 1453   CREATININE 0.64 09/21/2019 1453   CALCIUM 8.3 (L) 09/21/2019 1453   PROT 5.1 (L) 09/21/2019 1453   ALBUMIN 2.7 (L) 09/21/2019 1453   AST 23 09/21/2019 1453   ALT 14 09/21/2019 1453   ALKPHOS 132 (H) 09/21/2019 1453   BILITOT 0.5 09/21/2019 1453   GFRNONAA >60 09/21/2019 1453   GFRAA >60 09/21/2019 1453       Component Value Date/Time   WBC 3.4 (L) 09/21/2019 1453   WBC 1.5 (L) 09/06/2019 0327   RBC 3.10 (L) 09/21/2019 1453   HGB 8.9 (L) 09/21/2019 1453   HCT 26.9 (L) 09/21/2019 1453   PLT 91 (L) 09/21/2019 1453   MCV 86.8 09/21/2019 1453   MCH 28.7 09/21/2019 1453   MCHC 33.1 09/21/2019 1453   RDW 18.3 (H) 09/21/2019 1453   LYMPHSABS 0.3 (L) 09/21/2019 1453   MONOABS 0.4 09/21/2019 1453   EOSABS 0.0 09/21/2019 1453   BASOSABS 0.0 09/21/2019 1453   -------------------------------  Imaging from last 24 hours (if  applicable):  Radiology interpretation: DG Chest 1 View  Result Date: 09/01/2019 CLINICAL DATA:  Shortness of breath. Oxygen desaturation. Chest pain. EXAM: CHEST  1 VIEW COMPARISON:  Radiograph 08/23/2019, CT 08/25/2019 FINDINGS: Patient is rotated. Right chest port with tip in the lower SVC. Increase in bilateral pleural effusions from prior exam. Cardiomegaly which may have increased versus rotation. Increased interstitial thickening suspicious for pulmonary edema. No visualized pneumothorax. Nodularity in thoracic adenopathy seen on chest CT, not well demonstrated radiographically. IMPRESSION: 1. Increase in bilateral pleural effusions. Increased interstitial thickening consistent with pulmonary edema. Overall findings suggest increased fluid overload. 2. Rotated exam. Cardiomegaly with questionable progression versus rotation. Electronically Signed   By: Keith Rake M.D.   On: 09/01/2019 17:58   CT ANGIO CHEST PE W OR WO CONTRAST  Result Date: 08/25/2019 CLINICAL DATA:  Tachycardia. 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 EXAM: CT ANGIOGRAPHY CHEST WITH CONTRAST TECHNIQUE: Multidetector CT imaging of the chest was performed using the standard protocol during bolus administration of intravenous contrast. Multiplanar CT image reconstructions and MIPs were obtained to evaluate the vascular anatomy. CONTRAST:  195m OMNIPAQUE IOHEXOL 350 MG/ML SOLN COMPARISON:  Chest radiograph dated 08/23/2019. CTA chest dated 08/17/2019. FINDINGS: Cardiovascular: Evaluation is constrained by respiratory motion, particularly in the bilateral lower lobes. Within that constraint, there is no evidence of pulmonary embolism to the segmental level. No evidence of thoracic aortic aneurysm or dissection. The heart is normal in size. Moderate pericardial soft tissue/metastases inferiorly (series 4/image 73), unchanged, likely related to the patient's known lymphoproliferative disorder. Mediastinum/Nodes: Bulky thoracic  lymphadenopathy, related to the patient's known lymphoproliferative disorder, including: --2.3 cm short axis high right paratracheal node (series 4/image 28) --3.0 cm short axis AP window node (series 4/image 35) --1.4 cm short axis right hilar node (series 4/image 42) --2.6 cm short axis subcarinal node (series 4/image 43) --2.1  cm short axis intralobar/right lower lobe node (series 4/image 55) Visualized thyroid is unremarkable. Lungs/Pleura: Small bilateral pulmonary nodules, most of which are pleural/subpleural, unchanged from recent CT, including a dominant 9 mm nodule in the posterior right lower lobe (series 6/image 36). Dominant central pulmonary nodule measures 8 mm in the left lower lobe (series 6/image 78). These findings are likely related to the patient's known lymphoma. Moderate left pleural effusion, stable versus mildly increased, partially loculated. Trace right pleural effusion, mildly decreased. Associated bilateral lower lobe atelectasis. No pneumothorax. Upper Abdomen: Visualized upper abdomen is notable for a small hiatal hernia, masses splenomegaly, and a splenic infarct, better evaluated on recent CT abdomen/pelvis. Musculoskeletal: Degenerative changes of the visualized thoracolumbar spine. Review of the MIP images confirms the above findings. IMPRESSION: No evidence of pulmonary embolism. Bulky thoracic lymphadenopathy, pericardial thickening/soft tissue, and bilateral pulmonary nodules, likely related to the patient's known B-cell lymphoma, grossly unchanged. Moderate left pleural effusion, stable versus mildly increased, partially loculated. Trace right pleural effusion, mildly decreased. Associated bilateral lower lobe atelectasis. Overall, there is no significant interval change from recent CTs. Electronically Signed   By: Julian Hy M.D.   On: 08/25/2019 22:00   DG CHEST PORT 1 VIEW  Result Date: 08/23/2019 CLINICAL DATA:  Shortness of breath EXAM: PORTABLE CHEST 1 VIEW  COMPARISON:  Radiograph 08/22/2019 FINDINGS: Mild left anterior obliquity. Redemonstration of a 9 mm nodule in the right mid lung, seen on comparison CT. Stable bandlike areas of atelectasis and/or scarring. Additional hazy basilar atelectatic changes are present. No significant reaccumulation of the left effusion. No right effusion. Cardiomediastinal contours are stable. No acute osseous or soft tissue abnormality. IMPRESSION: 1. Unchanged 9 mm nodule in the right mid lung, seen on comparison CT. 2. No significant reaccumulation of the left effusion post thoracentesis. 3. Stable bandlike areas of atelectasis and/or scarring. Electronically Signed   By: Lovena Le M.D.   On: 08/23/2019 06:00   IR IMAGING GUIDED PORT INSERTION  Result Date: 08/30/2019 INDICATION: Recent diagnosis of B cell lymphoma. In need of durable intravenous access for chemotherapy administration. EXAM: IMPLANTED PORT A CATH PLACEMENT WITH ULTRASOUND AND FLUOROSCOPIC GUIDANCE COMPARISON:  Chest CT - 08/25/2019 MEDICATIONS: Ancef 2 gm IV; The antibiotic was administered within an appropriate time interval prior to skin puncture. ANESTHESIA/SEDATION: Moderate (conscious) sedation was employed during this procedure. A total of Versed 2 mg and Fentanyl 100 mcg was administered intravenously. Moderate Sedation Time: 23 minutes. The patient's level of consciousness and vital signs were monitored continuously by radiology nursing throughout the procedure under my direct supervision. CONTRAST:  None FLUOROSCOPY TIME:  18 seconds (4.6 mGy) COMPLICATIONS: None immediate. PROCEDURE: The procedure, risks, benefits, and alternatives were explained to the patient. Questions regarding the procedure were encouraged and answered. The patient understands and consents to the procedure. The right neck and chest were prepped with chlorhexidine in a sterile fashion, and a sterile drape was applied covering the operative field. Maximum barrier sterile technique  with sterile gowns and gloves were used for the procedure. A timeout was performed prior to the initiation of the procedure. Local anesthesia was provided with 1% lidocaine with epinephrine. After creating a small venotomy incision, a micropuncture kit was utilized to access the internal jugular vein. Real-time ultrasound guidance was utilized for vascular access including the acquisition of a permanent ultrasound image documenting patency of the accessed vessel. The microwire was utilized to measure appropriate catheter length. A subcutaneous port pocket was then created along the upper chest  wall utilizing a combination of sharp and blunt dissection. The pocket was irrigated with sterile saline. A single lumen ISP power injectable port was chosen for placement. The 8 Fr catheter was tunneled from the port pocket site to the venotomy incision. The port was placed in the pocket. The external catheter was trimmed to appropriate length. At the venotomy, an 8 Fr peel-away sheath was placed over a guidewire under fluoroscopic guidance. The catheter was then placed through the sheath and the sheath was removed. Final catheter positioning was confirmed and documented with a fluoroscopic spot radiograph. The port was accessed with a Huber needle, aspirated and flushed with heparinized saline. The venotomy site was closed with an interrupted 4-0 Vicryl suture. The port pocket incision was closed with interrupted 2-0 Vicryl suture and the skin was opposed with a running subcuticular 4-0 Vicryl suture. Dermabond and Steri-strips were applied to both incisions. Dressings were placed. The patient tolerated the procedure well without immediate post procedural complication. FINDINGS: After catheter placement, the tip lies within the superior cavoatrial junction. The catheter aspirates and flushes normally and is ready for immediate use. IMPRESSION: Successful placement of a right internal jugular approach power injectable  Port-A-Cath. The catheter is ready for immediate use. Electronically Signed   By: Sandi Mariscal M.D.   On: 08/30/2019 16:58

## 2019-09-22 ENCOUNTER — Inpatient Hospital Stay: Payer: Medicaid Other

## 2019-09-22 ENCOUNTER — Encounter: Payer: Self-pay | Admitting: Internal Medicine

## 2019-09-22 ENCOUNTER — Telehealth: Payer: Self-pay | Admitting: Medical Oncology

## 2019-09-22 ENCOUNTER — Other Ambulatory Visit: Payer: Self-pay

## 2019-09-22 ENCOUNTER — Telehealth: Payer: Self-pay | Admitting: Internal Medicine

## 2019-09-22 VITALS — BP 105/54 | HR 110 | Temp 98.9°F | Resp 16

## 2019-09-22 DIAGNOSIS — C9110 Chronic lymphocytic leukemia of B-cell type not having achieved remission: Secondary | ICD-10-CM | POA: Diagnosis not present

## 2019-09-22 DIAGNOSIS — C8308 Small cell B-cell lymphoma, lymph nodes of multiple sites: Secondary | ICD-10-CM

## 2019-09-22 MED ORDER — SODIUM CHLORIDE 0.9% FLUSH
10.0000 mL | INTRAVENOUS | Status: DC | PRN
  Administered 2019-09-22: 10 mL
  Filled 2019-09-22: qty 10

## 2019-09-22 MED ORDER — SODIUM CHLORIDE 0.9 % IV SOLN
Freq: Once | INTRAVENOUS | Status: AC
  Filled 2019-09-22: qty 5

## 2019-09-22 MED ORDER — SODIUM CHLORIDE 0.9 % IV SOLN
562.5000 mg/m2 | Freq: Once | INTRAVENOUS | Status: AC
  Administered 2019-09-22: 1020 mg via INTRAVENOUS
  Filled 2019-09-22: qty 51

## 2019-09-22 MED ORDER — HEPARIN SOD (PORK) LOCK FLUSH 100 UNIT/ML IV SOLN
500.0000 [IU] | Freq: Once | INTRAVENOUS | Status: AC | PRN
  Administered 2019-09-22: 500 [IU]
  Filled 2019-09-22: qty 5

## 2019-09-22 MED ORDER — SODIUM CHLORIDE 0.9 % IV SOLN
Freq: Once | INTRAVENOUS | Status: AC
  Filled 2019-09-22: qty 250

## 2019-09-22 MED ORDER — PALONOSETRON HCL INJECTION 0.25 MG/5ML
0.2500 mg | Freq: Once | INTRAVENOUS | Status: AC
  Administered 2019-09-22: 0.25 mg via INTRAVENOUS

## 2019-09-22 MED ORDER — DOXORUBICIN HCL CHEMO IV INJECTION 2 MG/ML
37.5000 mg/m2 | Freq: Once | INTRAVENOUS | Status: AC
  Administered 2019-09-22: 68 mg via INTRAVENOUS
  Filled 2019-09-22: qty 34

## 2019-09-22 MED ORDER — VINCRISTINE SULFATE CHEMO INJECTION 1 MG/ML
1.5000 mg | Freq: Once | INTRAVENOUS | Status: AC
  Administered 2019-09-22: 1.5 mg via INTRAVENOUS
  Filled 2019-09-22: qty 1.5

## 2019-09-22 MED ORDER — PALONOSETRON HCL INJECTION 0.25 MG/5ML
INTRAVENOUS | Status: AC
  Filled 2019-09-22: qty 5

## 2019-09-22 NOTE — Telephone Encounter (Signed)
Scheduled per los. Called and left msg. Mailed printout  °

## 2019-09-22 NOTE — Patient Instructions (Signed)
[facility] Discharge Instructions for Patients Receiving Chemotherapy  Today you received the following chemotherapy agents:  Doxorubicin, Vincristine, Cyclophosphamide  To help prevent nausea and vomiting after your treatment, we encourage you to take your nausea medication as prescribed.   If you develop nausea and vomiting that is not controlled by your nausea medication, call the clinic.   BELOW ARE SYMPTOMS THAT SHOULD BE REPORTED IMMEDIATELY:  *FEVER GREATER THAN 100.5 F  *CHILLS WITH OR WITHOUT FEVER  NAUSEA AND VOMITING THAT IS NOT CONTROLLED WITH YOUR NAUSEA MEDICATION  *UNUSUAL SHORTNESS OF BREATH  *UNUSUAL BRUISING OR BLEEDING  TENDERNESS IN MOUTH AND THROAT WITH OR WITHOUT PRESENCE OF ULCERS  *URINARY PROBLEMS  *BOWEL PROBLEMS  UNUSUAL RASH Items with * indicate a potential emergency and should be followed up as soon as possible.  Feel free to call the clinic should you have any questions or concerns. The clinic phone number is [phone].  Please show the [facility] at check-in to the Emergency Department and triage nurse.

## 2019-09-22 NOTE — Progress Notes (Signed)
Met w/ pt to offer the [employer].  I went over what it covers and gave him an expense sheet.  He's not working so he asked that I call his sister and explain everything and request a letter of support from her.  I called and left a msg requesting she return my call to discuss the grant and to go over what's needed for approval.

## 2019-09-22 NOTE — Progress Notes (Signed)
OK to proceed with treatment with PLT of 91 and heart rate 110-113 per Cassandra Heilingoetter, PA / Dr Julien Nordmann.

## 2019-09-22 NOTE — Telephone Encounter (Signed)
ON pts phone and his relatives , Wells Guiles phone I LVM about  injection appt at 3 pm tomorrow . Father contacted and gave me [facility] contact information.

## 2019-09-22 NOTE — Telephone Encounter (Signed)
Pt contacted and aware of injection appt tomorrow.

## 2019-09-23 ENCOUNTER — Other Ambulatory Visit: Payer: Self-pay

## 2019-09-23 ENCOUNTER — Inpatient Hospital Stay: Payer: Medicaid Other

## 2019-09-23 ENCOUNTER — Telehealth: Payer: Self-pay

## 2019-09-23 VITALS — BP 112/69 | HR 99 | Temp 98.7°F | Resp 16

## 2019-09-23 DIAGNOSIS — C8308 Small cell B-cell lymphoma, lymph nodes of multiple sites: Secondary | ICD-10-CM

## 2019-09-23 DIAGNOSIS — C9110 Chronic lymphocytic leukemia of B-cell type not having achieved remission: Secondary | ICD-10-CM | POA: Diagnosis not present

## 2019-09-23 MED ORDER — PEGFILGRASTIM-CBQV 6 MG/0.6ML ~~LOC~~ SOSY
PREFILLED_SYRINGE | SUBCUTANEOUS | Status: AC
  Filled 2019-09-23: qty 0.6

## 2019-09-23 MED ORDER — PEGFILGRASTIM-CBQV 6 MG/0.6ML ~~LOC~~ SOSY
6.0000 mg | PREFILLED_SYRINGE | Freq: Once | SUBCUTANEOUS | Status: AC
  Administered 2019-09-23: 6 mg via SUBCUTANEOUS

## 2019-09-23 NOTE — Telephone Encounter (Signed)
Faxed signed orders for [facility] to fax [phone], sent to HIM for scan to chart.

## 2019-09-23 NOTE — Patient Instructions (Signed)

## 2019-09-25 ENCOUNTER — Inpatient Hospital Stay: Payer: Medicaid Other

## 2019-09-27 ENCOUNTER — Encounter: Payer: Self-pay | Admitting: Internal Medicine

## 2019-09-27 NOTE — Progress Notes (Signed)
Pt is approved for the $700 [facility] grant.  °

## 2019-10-05 ENCOUNTER — Other Ambulatory Visit: Payer: Self-pay | Admitting: Internal Medicine

## 2019-10-05 ENCOUNTER — Telehealth: Payer: Self-pay | Admitting: *Deleted

## 2019-10-05 DIAGNOSIS — C8598 Non-Hodgkin lymphoma, unspecified, lymph nodes of multiple sites: Secondary | ICD-10-CM

## 2019-10-05 DIAGNOSIS — D649 Anemia, unspecified: Secondary | ICD-10-CM

## 2019-10-05 MED ORDER — HYDROXYZINE HCL 10 MG PO TABS: 10.0000 mg | ORAL_TABLET | Freq: Three times a day (TID) | ORAL | 1 refills | Status: AC | PRN

## 2019-10-05 MED ORDER — ALLOPURINOL 300 MG PO TABS: 300.0000 mg | ORAL_TABLET | Freq: Every day | ORAL | 0 refills | Status: AC

## 2019-10-05 MED ORDER — OMEPRAZOLE 20 MG PO CPDR: 20.0000 mg | DELAYED_RELEASE_CAPSULE | Freq: Every day | ORAL | 3 refills | Status: AC

## 2019-10-05 MED ORDER — FOLIC ACID 1 MG PO TABS: 1.0000 mg | ORAL_TABLET | Freq: Every day | ORAL | 0 refills | Status: DC

## 2019-10-05 NOTE — Telephone Encounter (Signed)
Sister [name] ( [employer] ) called requesting refills of the following medications :   Allopurinol 300 mg,  Prilosec 20 mg,  Atarax 10 mg, Lasix 20 mg, and Folic Acid 1 mg. [name] requested above medications to be refilled at  [place] on [address] ( pt can use [facility] for cheaper price ). [facility]   Phone    [phone].

## 2019-10-06 ENCOUNTER — Ambulatory Visit: Payer: Medicaid Other | Admitting: Family Medicine

## 2019-10-06 LAB — ACID FAST CULTURE WITH REFLEXED SENSITIVITIES (MYCOBACTERIA): Acid Fast Culture: NEGATIVE

## 2019-10-12 NOTE — Progress Notes (Deleted)
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  PRIOR THERAPY: The patient was tried on single agent Rituxan during his hospitalization in December 2020 discontinued secondary to significant hypersensitivity reaction.  CURRENT THERAPY: Systemic chemotherapy with CHOP, status post 2 cycles. First cycle was given on September 01, 2019.  INTERVAL HISTORY: Isaac Pierce 59 y.o. male returns to clinic today for follow-up visit.  The patient is feeling well today without any concerning complaints For __.  The patient tolerated his last cycle of treatment well without any adverse side effects.  He reports that his intermittent fevers have improved.  He states that his lymphadenopathy, particularly in the left axillary region is _.  He denies any more episodes of night sweats.  He denies any chest pain, shortness of breath, cough, or palpitations.  He denies any signs and symptoms of infection including nasal congestion, sore throat, dysuria, diarrhea, or skin infections.  He denies any abnormal bleeding or bruising including epistaxis, gingival bleeding, hematochezia, hematuria, or melena.  He reports his baseline constipation but states that this is improved since he has started taking a stool softener.  He is here today for evaluation before starting cycle #3 of CHOP.   MEDICAL HISTORY: Past Medical History:  Diagnosis Date  . Anxiety   . Back pain   . GERD (gastroesophageal reflux disease)     ALLERGIES:  is allergic to rituximab.  MEDICATIONS:  Current Outpatient Medications  Medication Sig Dispense Refill  . albuterol (VENTOLIN HFA) 108 (90 Base) MCG/ACT inhaler Inhale 2 puffs into the lungs every 6 (six) hours as needed for wheezing or shortness of breath. 6.7 g 0  . allopurinol (ZYLOPRIM) 300 MG tablet Take 1 tablet (300  mg total) by mouth daily. 30 tablet 0  . folic acid (FOLVITE) 1 MG tablet Take 1 tablet (1 mg total) by mouth daily. 30 tablet 0  . furosemide (LASIX) 20 MG tablet Take 1 tablet (20 mg total) by mouth daily. 30 tablet 0  . HYDROcodone-acetaminophen (NORCO) 5-325 MG tablet 1/2 to 1 tab PO q 6 hours prn pain 30 tablet 0  . hydrocortisone (ANUSOL-HC) 2.5 % rectal cream Place rectally 3 (three) times daily. 30 g 0  . hydrOXYzine (ATARAX/VISTARIL) 10 MG tablet Take 1 tablet (10 mg total) by mouth every 8 (eight) hours as needed. 30 tablet 1  . lidocaine-prilocaine (EMLA) cream Apply 1 application topically as needed. 30 g 0  . nicotine (NICODERM CQ - DOSED IN MG/24 HR) 7 mg/24hr patch Place 1 patch (7 mg total) onto the skin daily. 28 patch 0  . omeprazole (PRILOSEC) 20 MG capsule Take 1 capsule (20 mg total) by mouth daily. 30 capsule 3  . predniSONE (DELTASONE) 50 MG tablet Take 1 tablet by mouth daily for 5 days starting on days when chemotherapy is given 30 tablet 2  . prochlorperazine (COMPAZINE) 10 MG tablet Take 1 tablet (10 mg total) by mouth every 6 (six) hours as needed for nausea or vomiting. 30 tablet 2  . traMADol (ULTRAM) 50 MG tablet Take 1 tablet (50 mg total) by mouth every 6 (six) hours as needed for moderate pain. 20 tablet 0   No current facility-administered medications for this visit.    SURGICAL HISTORY:  Past Surgical History:  Procedure Laterality Date  . IR IMAGING GUIDED PORT INSERTION  08/30/2019    REVIEW  OF SYSTEMS:   Review of Systems  Constitutional: Negative for appetite change, chills, fatigue, fever and unexpected weight change.  HENT:   Negative for mouth sores, nosebleeds, sore throat and trouble swallowing.   Eyes: Negative for eye problems and icterus.  Respiratory: Negative for cough, hemoptysis, shortness of breath and wheezing.   Cardiovascular: Negative for chest pain and leg swelling.  Gastrointestinal: Negative for abdominal pain, constipation,  diarrhea, nausea and vomiting.  Genitourinary: Negative for bladder incontinence, difficulty urinating, dysuria, frequency and hematuria.   Musculoskeletal: Negative for back pain, gait problem, neck pain and neck stiffness.  Skin: Negative for itching and rash.  Neurological: Negative for dizziness, extremity weakness, gait problem, headaches, light-headedness and seizures.  Hematological: Negative for adenopathy. Does not bruise/bleed easily.  Psychiatric/Behavioral: Negative for confusion, depression and sleep disturbance. The patient is not nervous/anxious.     PHYSICAL EXAMINATION:  There were no vitals taken for this visit.  ECOG PERFORMANCE STATUS: {CHL ONC ECOG [id number]  Physical Exam  Constitutional: Oriented to person, place, and time and well-developed, well-nourished, and in no distress. No distress.  HENT:  Head: Normocephalic and atraumatic.  Mouth/Throat: Oropharynx is clear and moist. No oropharyngeal exudate.  Eyes: Conjunctivae are normal. Right eye exhibits no discharge. Left eye exhibits no discharge. No scleral icterus.  Neck: Normal range of motion. Neck supple.  Cardiovascular: Normal rate, regular rhythm, normal heart sounds and intact distal pulses.   Pulmonary/Chest: Effort normal and breath sounds normal. No respiratory distress. No wheezes. No rales.  Abdominal: Soft. Bowel sounds are normal. Exhibits no distension and no mass. There is no tenderness.  Musculoskeletal: Normal range of motion. Exhibits no edema.  Lymphadenopathy:    No cervical adenopathy.  Neurological: Alert and oriented to person, place, and time. Exhibits normal muscle tone. Gait normal. Coordination normal.  Skin: Skin is warm and dry. No rash noted. Not diaphoretic. No erythema. No pallor.  Psychiatric: Mood, memory and judgment normal.  Vitals reviewed.  LABORATORY DATA: Lab Results  Component Value Date   WBC 3.4 (L) 09/21/2019   HGB 8.9 (L) 09/21/2019   HCT 26.9 (L)  09/21/2019   MCV 86.8 09/21/2019   PLT 91 (L) 09/21/2019      Chemistry      Component Value Date/Time   NA 136 09/21/2019 1453   K 4.6 09/21/2019 1453   CL 97 (L) 09/21/2019 1453   CO2 28 09/21/2019 1453   BUN 11 09/21/2019 1453   CREATININE 0.64 09/21/2019 1453      Component Value Date/Time   CALCIUM 8.3 (L) 09/21/2019 1453   ALKPHOS 132 (H) 09/21/2019 1453   AST 23 09/21/2019 1453   ALT 14 09/21/2019 1453   BILITOT 0.5 09/21/2019 1453       RADIOGRAPHIC STUDIES:  No results found.   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 He also had significant pancytopenia on presentation. He was admitted to the hospital recently with sepsis that was treated.  The patient received the first cycle of his systemic chemotherapy with CHOP in the hospital on September 01, 2019 and he is feeling much better. He was given trial of single agent Rituxan as well as Rituxan with CHOP but has significant hypersensitivity reaction with severe shortness of breath, diaphoresis and chest pain. The treatment  with Rituxan was discontinued.  Patient was seen with Dr. Julien Nordmann today.  Labs were reviewed. Recommend that he proceed with cycle #3 today as scheduled.   I will arrange for restaging CT scan per to be performed prior to his next visit.  We will see the patient back for follow-up visit in 3 weeks for evaluation before starting cycle #4 and to review his scan results.  Norco?  The patient was advised to call immediately if he has any concerning symptoms in the interval. The patient voices understanding of current disease status and treatment options and is in agreement with the current care plan. All questions were answered. The patient knows to call  the clinic with any problems, questions or concerns. We can certainly see the patient much sooner if necessary     No orders of the defined types were placed in this encounter.    Lathen Seal L Kirin Pastorino, PA-C 10/12/19

## 2019-10-13 ENCOUNTER — Inpatient Hospital Stay: Payer: Medicaid Other

## 2019-10-13 ENCOUNTER — Inpatient Hospital Stay: Payer: Medicaid Other | Admitting: Physician Assistant

## 2019-10-13 ENCOUNTER — Inpatient Hospital Stay (HOSPITAL_BASED_OUTPATIENT_CLINIC_OR_DEPARTMENT_OTHER): Payer: Medicaid Other | Admitting: Physician Assistant

## 2019-10-13 ENCOUNTER — Other Ambulatory Visit: Payer: Self-pay

## 2019-10-13 ENCOUNTER — Telehealth: Payer: Self-pay

## 2019-10-13 ENCOUNTER — Encounter: Payer: Self-pay | Admitting: Physician Assistant

## 2019-10-13 ENCOUNTER — Other Ambulatory Visit: Payer: Self-pay | Admitting: Physician Assistant

## 2019-10-13 ENCOUNTER — Inpatient Hospital Stay: Payer: Medicaid Other | Attending: Internal Medicine

## 2019-10-13 VITALS — BP 88/49 | HR 121 | Temp 98.7°F | Resp 20 | Ht 71.0 in | Wt 147.2 lb

## 2019-10-13 DIAGNOSIS — F419 Anxiety disorder, unspecified: Secondary | ICD-10-CM | POA: Insufficient documentation

## 2019-10-13 DIAGNOSIS — D649 Anemia, unspecified: Secondary | ICD-10-CM

## 2019-10-13 DIAGNOSIS — R600 Localized edema: Secondary | ICD-10-CM | POA: Diagnosis not present

## 2019-10-13 DIAGNOSIS — Z7952 Long term (current) use of systemic steroids: Secondary | ICD-10-CM | POA: Diagnosis not present

## 2019-10-13 DIAGNOSIS — R519 Headache, unspecified: Secondary | ICD-10-CM | POA: Insufficient documentation

## 2019-10-13 DIAGNOSIS — F17210 Nicotine dependence, cigarettes, uncomplicated: Secondary | ICD-10-CM | POA: Diagnosis not present

## 2019-10-13 DIAGNOSIS — R5383 Other fatigue: Secondary | ICD-10-CM | POA: Diagnosis not present

## 2019-10-13 DIAGNOSIS — D61818 Other pancytopenia: Secondary | ICD-10-CM | POA: Diagnosis not present

## 2019-10-13 DIAGNOSIS — D701 Agranulocytosis secondary to cancer chemotherapy: Secondary | ICD-10-CM

## 2019-10-13 DIAGNOSIS — D6481 Anemia due to antineoplastic chemotherapy: Secondary | ICD-10-CM

## 2019-10-13 DIAGNOSIS — R630 Anorexia: Secondary | ICD-10-CM | POA: Diagnosis not present

## 2019-10-13 DIAGNOSIS — Z79899 Other long term (current) drug therapy: Secondary | ICD-10-CM | POA: Insufficient documentation

## 2019-10-13 DIAGNOSIS — C8308 Small cell B-cell lymphoma, lymph nodes of multiple sites: Secondary | ICD-10-CM | POA: Diagnosis not present

## 2019-10-13 DIAGNOSIS — K219 Gastro-esophageal reflux disease without esophagitis: Secondary | ICD-10-CM | POA: Diagnosis not present

## 2019-10-13 DIAGNOSIS — C9110 Chronic lymphocytic leukemia of B-cell type not having achieved remission: Secondary | ICD-10-CM | POA: Diagnosis not present

## 2019-10-13 DIAGNOSIS — T451X5A Adverse effect of antineoplastic and immunosuppressive drugs, initial encounter: Secondary | ICD-10-CM

## 2019-10-13 DIAGNOSIS — Z5111 Encounter for antineoplastic chemotherapy: Secondary | ICD-10-CM | POA: Insufficient documentation

## 2019-10-13 DIAGNOSIS — R634 Abnormal weight loss: Secondary | ICD-10-CM | POA: Diagnosis not present

## 2019-10-13 DIAGNOSIS — E876 Hypokalemia: Secondary | ICD-10-CM | POA: Insufficient documentation

## 2019-10-13 LAB — CMP (CANCER CENTER ONLY)
ALT: 28 U/L (ref 0–44)
AST: 50 U/L — ABNORMAL HIGH (ref 15–41)
Albumin: 2.1 g/dL — ABNORMAL LOW (ref 3.5–5.0)
Alkaline Phosphatase: 218 U/L — ABNORMAL HIGH (ref 38–126)
Anion gap: 10 (ref 5–15)
BUN: 18 mg/dL (ref 6–20)
CO2: 19 mmol/L — ABNORMAL LOW (ref 22–32)
Calcium: 7.7 mg/dL — ABNORMAL LOW (ref 8.9–10.3)
Chloride: 97 mmol/L — ABNORMAL LOW (ref 98–111)
Creatinine: 0.75 mg/dL (ref 0.61–1.24)
GFR, Est AFR Am: >60 mL/min (ref >60)
GFR, Estimated: >60 mL/min (ref >60)
Glucose, Bld: 109 mg/dL — ABNORMAL HIGH (ref 70–99)
Potassium: 4.3 mmol/L (ref 3.5–5.1)
Sodium: 126 mmol/L — ABNORMAL LOW (ref 135–145)
Total Bilirubin: 0.4 mg/dL (ref 0.3–1.2)
Total Protein: 4.5 g/dL — ABNORMAL LOW (ref 6.5–8.1)

## 2019-10-13 LAB — CBC WITH DIFFERENTIAL (CANCER CENTER ONLY)
Abs Immature Granulocytes: 0.1 10*3/uL — ABNORMAL HIGH (ref 0.00–0.07)
Band Neutrophils: 4 %
Basophils Absolute: 0 10*3/uL (ref 0.0–0.1)
Basophils Relative: 0 %
Eosinophils Absolute: 0 10*3/uL (ref 0.0–0.5)
Eosinophils Relative: 0 %
HCT: 17 % — ABNORMAL LOW (ref 39.0–52.0)
Hemoglobin: 5.6 g/dL — CL (ref 13.0–17.0)
Lymphocytes Relative: 1 %
Lymphs Abs: 0 10*3/uL — ABNORMAL LOW (ref 0.7–4.0)
MCH: 27.7 pg (ref 26.0–34.0)
MCHC: 32.9 g/dL (ref 30.0–36.0)
MCV: 84.2 fL (ref 80.0–100.0)
Metamyelocytes Relative: 4 %
Monocytes Absolute: 0 10*3/uL — ABNORMAL LOW (ref 0.1–1.0)
Monocytes Relative: 1 %
Neutro Abs: 1.3 10*3/uL — ABNORMAL LOW (ref 1.7–7.7)
Neutrophils Relative %: 90 %
Platelet Count: 65 10*3/uL — ABNORMAL LOW (ref 150–400)
RBC: 2.02 MIL/uL — ABNORMAL LOW (ref 4.22–5.81)
RDW: 19.2 % — ABNORMAL HIGH (ref 11.5–15.5)
WBC Count: 1.4 10*3/uL — ABNORMAL LOW (ref 4.0–10.5)
nRBC: 0 % (ref 0.0–0.2)

## 2019-10-13 LAB — ABO/RH: ABO/RH(D): O POS

## 2019-10-13 LAB — LACTATE DEHYDROGENASE: LDH: 791 U/L — ABNORMAL HIGH (ref 98–192)

## 2019-10-13 LAB — PREPARE RBC (CROSSMATCH)

## 2019-10-13 MED ORDER — SODIUM CHLORIDE 0.9% IV SOLUTION
250.0000 mL | Freq: Once | INTRAVENOUS | Status: AC
  Administered 2019-10-13: 16:00:00 250 mL via INTRAVENOUS
  Filled 2019-10-13: qty 250

## 2019-10-13 MED ORDER — HEPARIN SOD (PORK) LOCK FLUSH 100 UNIT/ML IV SOLN
500.0000 [IU] | Freq: Every day | INTRAVENOUS | Status: AC | PRN
  Administered 2019-10-13: 18:00:00 500 [IU]
  Filled 2019-10-13: qty 5

## 2019-10-13 MED ORDER — ACETAMINOPHEN 325 MG PO TABS
650.0000 mg | ORAL_TABLET | Freq: Once | ORAL | Status: AC
  Administered 2019-10-13: 650 mg via ORAL

## 2019-10-13 MED ORDER — PREDNISONE 20 MG PO TABS: ORAL_TABLET | ORAL | 0 refills | Status: DC

## 2019-10-13 MED ORDER — DIPHENHYDRAMINE HCL 25 MG PO CAPS
ORAL_CAPSULE | ORAL | Status: AC
  Filled 2019-10-13: qty 1

## 2019-10-13 MED ORDER — SODIUM CHLORIDE 0.9% FLUSH
10.0000 mL | INTRAVENOUS | Status: AC | PRN
  Administered 2019-10-13: 18:00:00 10 mL
  Filled 2019-10-13: qty 10

## 2019-10-13 MED ORDER — ACETAMINOPHEN 325 MG PO TABS
ORAL_TABLET | ORAL | Status: AC
  Filled 2019-10-13: qty 2

## 2019-10-13 MED ORDER — DIPHENHYDRAMINE HCL 25 MG PO CAPS
25.0000 mg | ORAL_CAPSULE | Freq: Once | ORAL | Status: AC
  Administered 2019-10-13: 25 mg via ORAL

## 2019-10-13 MED ORDER — ACETAMINOPHEN 325 MG PO TABS
650.0000 mg | ORAL_TABLET | Freq: Once | ORAL | Status: AC
  Administered 2019-10-13: 15:00:00 650 mg via ORAL

## 2019-10-13 NOTE — Telephone Encounter (Signed)
CRITICAL VALUE STICKER  CRITICAL VALUE: HGB 5.6   RECEIVER (on-site recipient of call): Lenox Ponds LPN  DATE & TIME NOTIFIED: 10/13/19 14:19  MESSENGER (representative from lab): Pam  MD NOTIFIED: Cassie Heillingoetter PA-C   TIME OF NOTIFICATION: 2:20  RESPONSE: Pt Sample blood tube to be drawn. Possible transfusion today

## 2019-10-13 NOTE — Patient Instructions (Signed)
https://www.[facility].org/donate-blood/blood-donation-process/what-happens-to-donated-blood/blood-transfusions/types-of-[email or web address].html"> https://www.[facility].org/donate-blood/blood-donation-process/what-happens-to-donated-blood/blood-transfusions/risks-complications.html">  Blood Transfusion, Adult, Care After This sheet gives you information about how to care for yourself after your procedure. Your health care provider may also give you more specific instructions. If you have problems or questions, contact your health care provider. What can I expect after the procedure? After the procedure, it is common to have:  Bruising and soreness where the IV was inserted.  A fever or chills on the day of the procedure. This may be your body's response to the new blood cells received.  A headache. Follow these instructions at home: IV insertion site care      Follow instructions from your health care provider about how to take care of your IV insertion site. Make sure you: ? Wash your hands with soap and water before and after you change your bandage (dressing). If soap and water are not available, use hand sanitizer. ? Change your dressing as told by your health care provider.  Check your IV insertion site every day for signs of infection. Check for: ? Redness, swelling, or pain. ? Bleeding from the site. ? Warmth. ? Pus or a bad smell. General instructions  Take over-the-counter and prescription medicines only as told by your health care provider.  Rest as told by your health care provider.  Return to your normal activities as told by your health care provider.  Keep all follow-up visits as told by your health care provider. This is important. Contact a health care provider if:  You have itching or red, swollen areas of skin (hives).  You feel anxious.  You feel weak after doing your normal activities.  You have redness, swelling, warmth, or pain around the IV  insertion site.  You have blood coming from the IV insertion site that does not stop with pressure.  You have pus or a bad smell coming from your IV insertion site. Get help right away if:  You have symptoms of a serious allergic or immune system reaction, including: ? Trouble breathing or shortness of breath. ? Swelling of the face or feeling flushed. ? Fever or chills. ? Pain in the head, back, or chest. ? Dark urine or blood in the urine. ? Widespread rash. ? Fast heartbeat. ? Feeling dizzy or light-headed. If you receive your blood transfusion in an outpatient setting, you will be told whom to contact to report any reactions. These symptoms may represent a serious problem that is an emergency. Do not wait to see if the symptoms will go away. Get medical help right away. Call your local emergency services (911 in the [place] Do not drive yourself to the hospital. Summary  Bruising and tenderness around the IV insertion site are common.  Check your IV insertion site every day for signs of infection.  Rest as told by your health care provider. Return to your normal activities as told by your health care provider.  Get help right away for symptoms of a serious allergic or immune system reaction to blood transfusion. This information is not intended to replace advice given to you by your health care provider. Make sure you discuss any questions you have with your health care provider. Document Revised: 03/11/2019 Document Reviewed: 03/11/2019 Else[employer] Patient Education  2020 [employer]  

## 2019-10-13 NOTE — Progress Notes (Signed)
Per Dr. Julien Nordmann, ok to infuse PRBC's @ 300 mL/hr.

## 2019-10-13 NOTE — Progress Notes (Signed)
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  PRIOR THERAPY: The patient was tried on single agent Rituxan during his hospitalization in December 2020 discontinued secondary to significant hypersensitivity reaction.  CURRENT THERAPY: Systemic chemotherapy with CHOP, status post 2 cycles. First cycle was given on September 01, 2019.   INTERVAL HISTORY: Isaac Pierce 59 y.o. male returns to the clinic today for a follow-up visit.  The patient was scheduled to receive cycle #3 of CHOP today; however, he missed his appointment this morning secondary to not feeling well over the last few days. The patient's sister reported that the patient has been experiencing low-grade fevers ~99-100.2, night sweats, hypotension with systolic blood pressures running in the 80s/90s, diarrhea, fatigue, decreased appetite, pallor, and abdominal distention/bloating.   The patient states his main complaint over the last few days was a headache which localized to his left temple, neck, and low back.  The patient states that he took 3 Tylenols which helped alleviate his symptoms.  He states that his headaches are better at this time.  He is reporting a decreased appetite without any weight loss.  He has gained weight since his last appointment.  He denies any signs and symptoms of infection including nasal congestion, sore throat, cough, shortness of breath, skin infections, or dysuria. He has mild baseline cough and shortness of breath which he attributes to "having a few smokes". The patient states he had a couple loose stools without any evidence of blood in his stool.  He reportedly had a lot of gas last night.  He denies any alcohol use.  He denies any bleeding or bruising including epistaxis, gingival bleeding, hematuria, hematochezia, or melena.  The  patient sister reported bloating but the patient denies any unusual bloating or changes with his palpable lymphadenopathy, particularly in the left axillary region.  He denies any nausea or vomiting. He is here today for evaluation and repeat blood work.   MEDICAL HISTORY: Past Medical History:  Diagnosis Date  . Anxiety   . Back pain   . GERD (gastroesophageal reflux disease)     ALLERGIES:  is allergic to rituximab.  MEDICATIONS:  Current Outpatient Medications  Medication Sig Dispense Refill  . albuterol (VENTOLIN HFA) 108 (90 Base) MCG/ACT inhaler Inhale 2 puffs into the lungs every 6 (six) hours as needed for wheezing or shortness of breath. 6.7 g 0  . allopurinol (ZYLOPRIM) 300 MG tablet Take 1 tablet (300 mg total) by mouth daily. 30 tablet 0  . folic acid (FOLVITE) 1 MG tablet Take 1 tablet (1 mg total) by mouth daily. 30 tablet 0  . furosemide (LASIX) 20 MG tablet Take 1 tablet (20 mg total) by mouth daily. 30 tablet 0  . HYDROcodone-acetaminophen (NORCO) 5-325 MG tablet 1/2 to 1 tab PO q 6 hours prn pain 30 tablet 0  . hydrocortisone (ANUSOL-HC) 2.5 % rectal cream Place rectally 3 (three) times daily. 30 g 0  . hydrOXYzine (ATARAX/VISTARIL) 10 MG tablet Take 1 tablet (10 mg total) by mouth every 8 (eight) hours as needed. 30 tablet 1  . lidocaine-prilocaine (EMLA) cream Apply 1 application topically as needed. 30 g 0  . nicotine (NICODERM CQ - DOSED IN MG/24 HR) 7 mg/24hr patch Place 1 patch (7 mg total) onto the skin daily. 28 patch 0  . omeprazole (PRILOSEC) 20 MG capsule Take 1 capsule (20  mg total) by mouth daily. 30 capsule 3  . predniSONE (DELTASONE) 20 MG tablet Please take 3 tablets (60 mg) daily for 7 days for 1 week, followed by 2 tablets (40 mg) daily for 7 days, followed by 1 tablet (20 mg) daily for 7 days, followed by 1/2 a tablet daily for 7 days. 46 tablet 0  . predniSONE (DELTASONE) 50 MG tablet Take 1 tablet by mouth daily for 5 days starting on days when  chemotherapy is given 30 tablet 2  . prochlorperazine (COMPAZINE) 10 MG tablet Take 1 tablet (10 mg total) by mouth every 6 (six) hours as needed for nausea or vomiting. 30 tablet 2  . traMADol (ULTRAM) 50 MG tablet Take 1 tablet (50 mg total) by mouth every 6 (six) hours as needed for moderate pain. 20 tablet 0   No current facility-administered medications for this visit.   Facility-Administered Medications Ordered in Other Visits  Medication Dose Route Frequency Provider Last Rate Last Admin  . 0.9 %  sodium chloride infusion (Manually program via Guardrails IV Fluids)  250 mL Intravenous Once Phu Record L, PA-C      . heparin lock flush 100 unit/mL  500 Units Intracatheter Daily PRN Taressa Rauh L, PA-C      . sodium chloride flush (NS) 0.9 % injection 10 mL  10 mL Intracatheter PRN Gianelle Mccaul L, PA-C        SURGICAL HISTORY:  Past Surgical History:  Procedure Laterality Date  . IR IMAGING GUIDED PORT INSERTION  08/30/2019    REVIEW OF SYSTEMS:   Review of Systems  Constitutional: Positive for fatigue, fever, night sweats, decreased appetite.  Negative for chills HENT: Negative for mouth sores, nosebleeds, sore throat and trouble swallowing.   Eyes: Negative for eye problems and icterus.  Respiratory: Positive for mild baseline cough and shortness of breath. Negative for hemoptysis and wheezing.  Cardiovascular: Negative for chest pain and leg swelling.  Gastrointestinal: Positive for LUQ pain (improved) and diarrhea (improved). Negative for nausea and vomiting.  Genitourinary: Negative for bladder incontinence, difficulty urinating, dysuria, frequency and hematuria.   Musculoskeletal: Negative for back pain, gait problem, neck pain and neck stiffness.  Skin: Negative for itching and rash.  Neurological: Negative for dizziness, extremity weakness, gait problem, headaches, light-headedness and seizures.  Hematological: Positive for left  axillary lymphadenopathy. Does not bruise/bleed easily.  Psychiatric/Behavioral: Negative for confusion, depression and sleep disturbance. The patient is not nervous/anxious.   PHYSICAL EXAMINATION:  Blood pressure (!) 88/49, pulse (!) 121, temperature 98.7 F (37.1 C), temperature source Temporal, resp. rate 20, height 5\' 11"  (1.803 m), weight 147 lb 3.2 oz (66.8 kg), SpO2 98 %.  ECOG PERFORMANCE STATUS: 1 - Symptomatic but completely ambulatory  Physical Exam  Constitutional: Oriented to person, place, and time and thin-appearing male and in no distress.  HENT:  Head: Normocephalic and atraumatic.  Mouth/Throat: Oropharynx is clear and moist. No oropharyngeal exudate.  Eyes: Conjunctivae are normal. Right eye exhibits no discharge. Left eye exhibits no discharge. No scleral icterus.  Neck: Normal range of motion. Neck supple.  Cardiovascular: Normal rate, regular rhythm, normal heart sounds and intact distal pulses.   Pulmonary/Chest: Effort normal and breath sounds normal. No respiratory distress. No wheezes. No rales.  Abdominal: Splenomegaly noted. Soft. Bowel sounds are normal. There is no tenderness.  Musculoskeletal: Normal range of motion. Exhibits no edema.  Lymphadenopathy:    Left axillary lymphadenopathy stable.  Neurological: Alert and oriented to person, place, and time. Exhibits  normal muscle tone. Gait normal. Coordination normal.  Skin: Skin is warm and dry. No rash noted. Not diaphoretic. No erythema. No pallor.  Psychiatric: Mood, memory and judgment normal.  Vitals reviewed.  LABORATORY DATA: Lab Results  Component Value Date   WBC 1.4 (L) 10/13/2019   HGB 5.6 (LL) 10/13/2019   HCT 17.0 (L) 10/13/2019   MCV 84.2 10/13/2019   PLT 65 (L) 10/13/2019      Chemistry      Component Value Date/Time   NA 126 (L) 10/13/2019 1408   K 4.3 10/13/2019 1408   CL 97 (L) 10/13/2019 1408   CO2 19 (L) 10/13/2019 1408   BUN 18 10/13/2019 1408   CREATININE 0.75  10/13/2019 1408      Component Value Date/Time   CALCIUM 7.7 (L) 10/13/2019 1408   ALKPHOS 218 (H) 10/13/2019 1408   AST 50 (H) 10/13/2019 1408   ALT 28 10/13/2019 1408   BILITOT 0.4 10/13/2019 1408       RADIOGRAPHIC STUDIES:  No results found.   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 He also had significant pancytopenia on presentation. He was admitted to the hospital recently with sepsis that was treated.  The patient received the first cycle of his systemic chemotherapy with CHOP in the hospital on September 01, 2019 and he is feeling much better. He was given trial of single agent Rituxan as well as Rituxan with CHOP but has significant hypersensitivity reaction with severe shortness of breath, diaphoresis and chest pain. The treatment with Rituxan was discontinued.  The patient was seen with Dr. Julien Nordmann today. Labs were reviewed which shows pancytopenia.  Blood work shows significant anemia with a hemoglobin of 5.6.  We will arrange for the patient to receive 1 unit of packed RBCs today followed by 2 units tomorrow.  The patient's WBC's are 1.4 with an [place] of 1.3.  I reviewed neutropenic precautions with the patient's sister.  The patient's platelets are low today.  The patient denies any signs and symptoms of bleeding at this time.  Given the patient's constellation symptoms and blood work, Dr. Julien Nordmann is concerned about this treatment not adequately controlling the patient's cancer.  Dr. Julien Nordmann recommends that we see the patient back for follow-up visit in 2 days for evaluation and further discussion regarding his current condition and treatment options.   We will see the patient back for follow-up visit in 2 days.  For the patient's  blood work today, Dr. Julien Nordmann recommends that we start the patient on a high-dose prednisone taper.  He was instructed to start taking 60 mg p.o. daily for 7 days followed by 40 mg daily for 7 days, followed by 20 mg daily for 7 days, followed by 10 mg daily for 7 days.  The patient's sodium was a little low today. We will recheck his blood work on Friday. In the meantime, he was encouraged to increase his dietary intake of salty food.   The patient was advised to call immediately if he has any concerning symptoms in the interval. The patient voices understanding of current disease status and treatment options and is in agreement with the current care plan. All questions were answered. The patient knows to call the clinic with any problems, questions or concerns. We can certainly  see the patient much sooner if necessary    Orders Placed This Encounter  Procedures  . Practitioner attestation of consent    I, the ordering practitioner, attest that I have discussed with the patient the benefits, risks, side effects, alternatives, likelihood of achieving goals and potential problems during recovery for the procedure listed.    Standing Status:   Future    Standing Expiration Date:   10/12/2020    Order Specific Question:   Procedure    Answer:   Blood Product(s)  . Complete patient signature process for consent form    Standing Status:   Future    Standing Expiration Date:   10/12/2020  . Care order/instruction    Transfuse Parameters    Standing Status:   Future    Standing Expiration Date:   10/12/2020  . Sample to Blood Bank    Standing Status:   Future    Standing Expiration Date:   10/12/2020  . Type and screen    Standing Status:   Future    Number of Occurrences:   1    Standing Expiration Date:   10/12/2020     Tobe Sos Lucresha Dismuke, PA-C 10/13/19  ADDENDUM: Hematology/Oncology Attending: I had a face-to-face encounter with the patient today.  I recommended his care plan.   This is a very pleasant 59 years old white male with small lymphocytic lymphoma/CLL with bulky lymphadenopathy in the chest and abdomen as well as splenomegaly.  He was treated in the hospital with 1 cycle of CHOP.  He could not tolerate Rituxan.  He received the second cycle of his treatment with CHOP at the cancer center and tolerated the treatment well.  He has been complaining of increasing fatigue and weakness as well as nausea and lack of appetite.  He had repeat blood work today that showed pancytopenia with leukocytopenia as well as anemia with hemoglobin 5.6 and platelets count of 65. We will arrange for the patient to receive at least 1 unit of PRBCs transfusion today and 1 tomorrow.  We will also start the patient on prednisone 1 mg/KG daily for 1 week and then will taper gradually over the next few weeks because of the suspicious immune mediated anemia and thrombocytopenia. Starting from the next cycle I may consider treatment with a different regimen including obinutuzumab and acalabrutinib. The patient will come back for follow-up visit in 2 days for further evaluation before the weekend and consideration of supportive care if needed. He was also advised to call immediately if he has any concerning symptoms in the interval.  Disclaimer: This note was dictated with voice recognition software. Similar sounding words can inadvertently be transcribed and may be missed upon review. Eilleen Kempf, MD 10/13/19

## 2019-10-14 ENCOUNTER — Other Ambulatory Visit: Payer: Self-pay

## 2019-10-14 ENCOUNTER — Ambulatory Visit: Payer: Medicaid Other

## 2019-10-14 ENCOUNTER — Inpatient Hospital Stay: Payer: Medicaid Other

## 2019-10-14 ENCOUNTER — Other Ambulatory Visit: Payer: Self-pay | Admitting: Internal Medicine

## 2019-10-14 ENCOUNTER — Other Ambulatory Visit: Payer: Self-pay | Admitting: Emergency Medicine

## 2019-10-14 DIAGNOSIS — D649 Anemia, unspecified: Secondary | ICD-10-CM

## 2019-10-14 DIAGNOSIS — Z5111 Encounter for antineoplastic chemotherapy: Secondary | ICD-10-CM | POA: Diagnosis not present

## 2019-10-14 LAB — PREPARE RBC (CROSSMATCH)

## 2019-10-14 MED ORDER — DIPHENHYDRAMINE HCL 25 MG PO CAPS
25.0000 mg | ORAL_CAPSULE | Freq: Once | ORAL | Status: AC
  Administered 2019-10-14: 25 mg via ORAL

## 2019-10-14 MED ORDER — HEPARIN SOD (PORK) LOCK FLUSH 100 UNIT/ML IV SOLN
500.0000 [IU] | Freq: Every day | INTRAVENOUS | Status: AC | PRN
  Administered 2019-10-14: 14:00:00 500 [IU]
  Filled 2019-10-14: qty 5

## 2019-10-14 MED ORDER — SODIUM CHLORIDE 0.9% IV SOLUTION
250.0000 mL | Freq: Once | INTRAVENOUS | Status: AC
  Administered 2019-10-14: 250 mL via INTRAVENOUS
  Filled 2019-10-14: qty 250

## 2019-10-14 MED ORDER — ACETAMINOPHEN 325 MG PO TABS
ORAL_TABLET | ORAL | Status: AC
  Filled 2019-10-14: qty 2

## 2019-10-14 MED ORDER — DIPHENHYDRAMINE HCL 25 MG PO CAPS
ORAL_CAPSULE | ORAL | Status: AC
  Filled 2019-10-14: qty 1

## 2019-10-14 MED ORDER — ACETAMINOPHEN 325 MG PO TABS
650.0000 mg | ORAL_TABLET | Freq: Once | ORAL | Status: AC
  Administered 2019-10-14: 650 mg via ORAL

## 2019-10-14 MED ORDER — SODIUM CHLORIDE 0.9% FLUSH
10.0000 mL | INTRAVENOUS | Status: AC | PRN
  Administered 2019-10-14: 14:00:00 10 mL
  Filled 2019-10-14: qty 10

## 2019-10-14 NOTE — Progress Notes (Signed)
Pt received 2 units PRBCS today, tolerated well.  Reports feeling better at end of tx.  VSS.  Able to eat and drink and ambulate to restroom during visit w/out any issues.

## 2019-10-14 NOTE — Patient Instructions (Signed)

## 2019-10-15 ENCOUNTER — Ambulatory Visit: Payer: Medicaid Other

## 2019-10-15 ENCOUNTER — Other Ambulatory Visit: Payer: Self-pay

## 2019-10-15 ENCOUNTER — Inpatient Hospital Stay (HOSPITAL_BASED_OUTPATIENT_CLINIC_OR_DEPARTMENT_OTHER): Payer: Medicaid Other | Admitting: Physician Assistant

## 2019-10-15 ENCOUNTER — Inpatient Hospital Stay: Payer: Medicaid Other

## 2019-10-15 ENCOUNTER — Telehealth: Payer: Self-pay | Admitting: Pharmacist

## 2019-10-15 ENCOUNTER — Other Ambulatory Visit: Payer: Self-pay | Admitting: Internal Medicine

## 2019-10-15 ENCOUNTER — Encounter: Payer: Self-pay | Admitting: Physician Assistant

## 2019-10-15 VITALS — BP 98/64 | HR 100 | Temp 98.0°F | Resp 18 | Ht 71.0 in | Wt 151.8 lb

## 2019-10-15 DIAGNOSIS — T451X5A Adverse effect of antineoplastic and immunosuppressive drugs, initial encounter: Secondary | ICD-10-CM

## 2019-10-15 DIAGNOSIS — C8308 Small cell B-cell lymphoma, lymph nodes of multiple sites: Secondary | ICD-10-CM

## 2019-10-15 DIAGNOSIS — D701 Agranulocytosis secondary to cancer chemotherapy: Secondary | ICD-10-CM

## 2019-10-15 DIAGNOSIS — D649 Anemia, unspecified: Secondary | ICD-10-CM

## 2019-10-15 DIAGNOSIS — R1012 Left upper quadrant pain: Secondary | ICD-10-CM

## 2019-10-15 DIAGNOSIS — Z7189 Other specified counseling: Secondary | ICD-10-CM

## 2019-10-15 DIAGNOSIS — D61818 Other pancytopenia: Secondary | ICD-10-CM

## 2019-10-15 DIAGNOSIS — E43 Unspecified severe protein-calorie malnutrition: Secondary | ICD-10-CM

## 2019-10-15 DIAGNOSIS — Z5111 Encounter for antineoplastic chemotherapy: Secondary | ICD-10-CM

## 2019-10-15 LAB — TYPE AND SCREEN
ABO/RH(D): O POS
Antibody Screen: NEGATIVE
Unit division: 0
Unit division: 0
Unit division: 0

## 2019-10-15 LAB — BPAM RBC
Blood Product Expiration Date: 202101202359
Blood Product Expiration Date: 202102102359
Blood Product Expiration Date: 202102102359
ISSUE DATE / TIME: 202101131621
ISSUE DATE / TIME: 202101140945
ISSUE DATE / TIME: 202101140945
Unit Type and Rh: 5100
Unit Type and Rh: 5100
Unit Type and Rh: 5100

## 2019-10-15 LAB — CBC WITH DIFFERENTIAL (CANCER CENTER ONLY)
Abs Immature Granulocytes: 0.06 10*3/uL (ref 0.00–0.07)
Basophils Absolute: 0 10*3/uL (ref 0.0–0.1)
Basophils Relative: 0 %
Eosinophils Absolute: 0 10*3/uL (ref 0.0–0.5)
Eosinophils Relative: 0 %
HCT: 25 % — ABNORMAL LOW (ref 39.0–52.0)
Hemoglobin: 8.6 g/dL — ABNORMAL LOW (ref 13.0–17.0)
Immature Granulocytes: 2 %
Lymphocytes Relative: 3 %
Lymphs Abs: 0.1 10*3/uL — ABNORMAL LOW (ref 0.7–4.0)
MCH: 29.2 pg (ref 26.0–34.0)
MCHC: 34.4 g/dL (ref 30.0–36.0)
MCV: 84.7 fL (ref 80.0–100.0)
Monocytes Absolute: 0.2 10*3/uL (ref 0.1–1.0)
Monocytes Relative: 7 %
Neutro Abs: 2.2 10*3/uL (ref 1.7–7.7)
Neutrophils Relative %: 88 %
Platelet Count: 66 10*3/uL — ABNORMAL LOW (ref 150–400)
RBC: 2.95 MIL/uL — ABNORMAL LOW (ref 4.22–5.81)
RDW: 17.8 % — ABNORMAL HIGH (ref 11.5–15.5)
WBC Count: 2.5 10*3/uL — ABNORMAL LOW (ref 4.0–10.5)
nRBC: 0 % (ref 0.0–0.2)

## 2019-10-15 LAB — BASIC METABOLIC PANEL - CANCER CENTER ONLY
Anion gap: 12 (ref 5–15)
BUN: 21 mg/dL — ABNORMAL HIGH (ref 6–20)
CO2: 18 mmol/L — ABNORMAL LOW (ref 22–32)
Calcium: 8 mg/dL — ABNORMAL LOW (ref 8.9–10.3)
Chloride: 98 mmol/L (ref 98–111)
Creatinine: 0.7 mg/dL (ref 0.61–1.24)
GFR, Est AFR Am: >60 mL/min (ref >60)
GFR, Estimated: >60 mL/min (ref >60)
Glucose, Bld: 201 mg/dL — ABNORMAL HIGH (ref 70–99)
Potassium: 4.3 mmol/L (ref 3.5–5.1)
Sodium: 128 mmol/L — ABNORMAL LOW (ref 135–145)

## 2019-10-15 LAB — SAMPLE TO BLOOD BANK

## 2019-10-15 MED ORDER — ACALABRUTINIB 100 MG PO CAPS: 100.0000 mg | ORAL_CAPSULE | Freq: Two times a day (BID) | ORAL | 2 refills | Status: AC

## 2019-10-15 MED ORDER — HYDROCODONE-ACETAMINOPHEN 5-325 MG PO TABS: ORAL_TABLET | ORAL | 0 refills | Status: DC

## 2019-10-15 NOTE — Progress Notes (Signed)
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  PRIOR THERAPY:  1) The patient was tried on single agent Rituxan during his hospitalization in December 2020 discontinued secondary to significant hypersensitivity reaction. 2) Systemic chemotherapy with CHOP, status post 2 cycles. First cycle was given on September 01, 2019.   CURRENT THERAPY: Obinutuzumab 1,000 mg IV and oral Acalabrutinib 100 mg p.o. BID. First dose expected on 10/21/2019.    INTERVAL HISTORY: Isaac Pierce 59 y.o. male returns to the clinic for a follow up visit. The patient was scheduled for cycle #3 of CHOP earlier this week but missed his appointment secondary to not feeling well. The patient was seen in the clinic to evaluate his symptoms of hypotension, headache, fatigue, and shortness of breath. Routine labs showed pancytopenia. His hemoglobin was critically low at 5.6 he also was tachycardic. He denied any signs of symptoms of bleeding/bruising. The patient subsequently received 3 units of pRBCs. He is here for evaluation and to recheck his blood work and discuss further treatment options.   Since having the blood transfusions, the patient is feeling better today. He is just a little fatigued today from not sleeping well last night. He has not had a fever in several days. He denies chills. He denies any weight loss; however, he is actually been gaining weight despite having a decreased appetite which often consists of eating oatmeal and protein shakes.  The patient states that he has been having " a little bit" night sweats.  He denies any chest pain.  He states that his shortness of breath is improved since receiving the blood that he still is experiencing a little bit of shortness of breath and coughing "now and then" secondary to his " roommate's  wood-burning stove".  Patient denies any nausea, vomiting, diarrhea, or constipation.  He is reporting that his abdomen may feel a little more firm.  He has had splenomegaly in the past.  He states that his headache has resolved since receiving blood. He denies any visual changes.  The patient is here today for repeat blood work for evaluation/ a more detailed discussion of his treatment options.  MEDICAL HISTORY: Past Medical History:  Diagnosis Date  . Anxiety   . Back pain   . GERD (gastroesophageal reflux disease)     ALLERGIES:  is allergic to rituximab.  MEDICATIONS:  Current Outpatient Medications  Medication Sig Dispense Refill  . allopurinol (ZYLOPRIM) 300 MG tablet Take 1 tablet (300 mg total) by mouth daily. 30 tablet 0  . folic acid (FOLVITE) 1 MG tablet Take 1 tablet by mouth once daily 30 tablet 0  . furosemide (LASIX) 20 MG tablet Take 1 tablet (20 mg total) by mouth daily. 30 tablet 0  . HYDROcodone-acetaminophen (NORCO) 5-325 MG tablet 1/2 to 1 tab PO q 6 hours prn pain 30 tablet 0  . hydrocortisone (ANUSOL-HC) 2.5 % rectal cream Place rectally 3 (three) times daily. 30 g 0  . hydrOXYzine (ATARAX/VISTARIL) 10 MG tablet Take 1 tablet (10 mg total) by mouth every 8 (eight) hours as needed. 30 tablet 1  . lidocaine-prilocaine (EMLA) cream Apply 1 application topically as needed. 30 g 0  . omeprazole (PRILOSEC) 20 MG capsule Take 1 capsule (20 mg total) by mouth daily. 30 capsule 3  . predniSONE (DELTASONE) 20 MG tablet Please take 3 tablets (60 mg) daily for 7  days for 1 week, followed by 2 tablets (40 mg) daily for 7 days, followed by 1 tablet (20 mg) daily for 7 days, followed by 1/2 a tablet daily for 7 days. 46 tablet 0  . traMADol (ULTRAM) 50 MG tablet Take 1 tablet (50 mg total) by mouth every 6 (six) hours as needed for moderate pain. 20 tablet 0  . acalabrutinib (CALQUENCE) 100 MG capsule Take 1 capsule (100 mg total) by mouth 2 (two) times daily. 60 capsule 2  .  albuterol (VENTOLIN HFA) 108 (90 Base) MCG/ACT inhaler Inhale 2 puffs into the lungs every 6 (six) hours as needed for wheezing or shortness of breath. 6.7 g 0  . nicotine (NICODERM CQ - DOSED IN MG/24 HR) 7 mg/24hr patch Place 1 patch (7 mg total) onto the skin daily. (Patient not taking: Reported on 10/15/2019) 28 patch 0  . prochlorperazine (COMPAZINE) 10 MG tablet Take 1 tablet (10 mg total) by mouth every 6 (six) hours as needed for nausea or vomiting. (Patient not taking: Reported on 10/15/2019) 30 tablet 2   No current facility-administered medications for this visit.    SURGICAL HISTORY:  Past Surgical History:  Procedure Laterality Date  . IR IMAGING GUIDED PORT INSERTION  08/30/2019    REVIEW OF SYSTEMS:   Review of Systems  Constitutional: Positive for decreased appetite and fatigue. Negative for chills,  Fever (resolved) and unexpected weight change.  HENT: Negative for mouth sores, nosebleeds, sore throat and trouble swallowing.   Eyes: Negative for eye problems and icterus.  Respiratory: Positive for a mild cough and shortness of breath (improved). Negative for hemoptysis and wheezing.  Cardiovascular: Negative for chest pain and leg swelling.  Gastrointestinal: Positive for abdominal distention. Negative for abdominal pain, constipation, diarrhea, nausea and vomiting.  Genitourinary: Negative for bladder incontinence, difficulty urinating, dysuria, frequency and hematuria.   Musculoskeletal: Negative for back pain, gait problem, neck pain and neck stiffness.  Skin: Negative for itching and rash.  Neurological: Negative for dizziness, extremity weakness, gait problem, headaches, light-headedness and seizures.  Hematological: Negative for adenopathy. Does not bruise/bleed easily.  Psychiatric/Behavioral: Negative for confusion, depression and sleep disturbance. The patient is not nervous/anxious.     PHYSICAL EXAMINATION:  Blood pressure 98/64, pulse 100, temperature 98 F  (36.7 C), temperature source Temporal, resp. rate 18, height 5\' 11"  (1.803 m), weight 151 lb 12.8 oz (68.9 kg), SpO2 100 %.  ECOG PERFORMANCE STATUS: 1 - Symptomatic but completely ambulatory  Physical Exam  Constitutional: Oriented to person, place, and time and chronically ill appearing male and in no distress.  HENT:  Head: Normocephalic and atraumatic.  Mouth/Throat: Oropharynx is clear and moist. No oropharyngeal exudate.  Eyes: Conjunctivae are normal. Right eye exhibits no discharge. Left eye exhibits no discharge. No scleral icterus.  Neck: Normal range of motion. Neck supple.  Cardiovascular: Normal rate, regular rhythm, normal heart sounds and intact distal pulses.   Pulmonary/Chest: Effort normal and breath sounds normal. No respiratory distress. No wheezes. No rales.  Abdominal: Firm abdomen. Splenomegaly noted. Bowel sounds are normal.  There is no tenderness.  Musculoskeletal: Normal range of motion. Exhibits no edema.  Lymphadenopathy:    Left axillary lymphadenopathy. Neurological: Alert and oriented to person, place, and time. Exhibits normal muscle tone. Examined in a wheelchair. Skin: Positive for pallor. Skin is warm and dry. No rash noted. Not diaphoretic. No erythema.  Psychiatric: Mood, memory and judgment normal.  Vitals reviewed.  LABORATORY DATA: Lab Results  Component Value Date  WBC 2.5 (L) 10/15/2019   HGB 8.6 (L) 10/15/2019   HCT 25.0 (L) 10/15/2019   MCV 84.7 10/15/2019   PLT 66 (L) 10/15/2019      Chemistry      Component Value Date/Time   NA 128 (L) 10/15/2019 0841   K 4.3 10/15/2019 0841   CL 98 10/15/2019 0841   CO2 18 (L) 10/15/2019 0841   BUN 21 (H) 10/15/2019 0841   CREATININE 0.70 10/15/2019 0841      Component Value Date/Time   CALCIUM 8.0 (L) 10/15/2019 0841   ALKPHOS 218 (H) 10/13/2019 1408   AST 50 (H) 10/13/2019 1408   ALT 28 10/13/2019 1408   BILITOT 0.4 10/13/2019 1408       RADIOGRAPHIC STUDIES:  No results  found.   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 He also had significant pancytopenia on presentation. He was admitted to the hospital recently with sepsis that was treated.  The patient received two cycles of systemic chemotherapy with CHOP in the hospital on September 01, 2019.He was given trial of single agent Rituxan as well as Rituxan with CHOP but has significant hypersensitivity reaction with severe shortness of breath, diaphoresis and chest pain. The treatment with Rituxan was discontinued.   The patient was seen for pancytopenia earlier this week. The patient received 3 units of RBCs and was started on a high dose prednisone taper for suspicious immune mediated anemia and thrombocytopenia. He is feeling much better since this time.   The patient was seen with Dr. Julien Nordmann today. Repeat labs were reviewed which shows a hemoglobin of 8.6, persistent thrombocytopenia with a platelet count of 66k, and WBC of 2.5 with ANC of 2.2.    Dr. Julien Nordmann recommends that we change the patient's treatment to Acalabrutinib 100 mg p.o. BID and Obinutuzumab IV. The patient is interested in this option. The adverse side effects of treatment were discussed. He is expected to receive his first dose on 10/21/2019.   We will see the patient back for follow-up visit next week before starting his first cycle of treatment.    I have sent a refill of Norco to his pharmacy for his pain associated for his splenomegaly.   The patient will continue to take his prednisone taper as prescribed. He knows that he no longer needs to take his 50 mg prednisone prescription starting on days of treatment anymore.   The patient was advised to call immediately if he has any concerning  symptoms in the interval. The patient voices understanding of current disease status and treatment options and is in agreement with the current care plan. All questions were answered. The patient knows to call the clinic with any problems, questions or concerns. We can certainly see the patient much sooner if necessary.   Orders Placed This Encounter  Procedures  . Uric acid    Standing Status:   Standing    Number of Occurrences:   18    Standing Expiration Date:   10/14/2020     Andreia Gandolfi L Gearold Wainer, PA-C 10/15/19  ADDENDUM: Hematology/Oncology Attending: I had a face-to-face encounter with the patient today.  I recommended his care plan.  This is a very pleasant 59 years old white male with bulky small lymphocytic lymphoma/CLL with lymphadenopathy in the neck, chest, abdomen and  pelvis as well as hepatosplenomegaly and involvement of the bone marrow.  The patient also had significant pancytopenia.  He was treated with 2 cycles of CHOP.  He has hypersensitivity reaction to Rituxan on 2 separate occasions. He had some improvement in his disease and blood count but the patient continues to have the significant lymphadenopathy as well as hepatosplenomegaly. He received 2 units of PRBCs transfusion the last 2 days with improvement of his hemoglobin on the blood count today.  He also has improvement in his total white blood count after treatment with Granix.  The patient continues to have thrombocytopenia. I had a lengthy discussion with the patient today about his condition and other treatment options.  I do not think the patient is responding well to the treatment with CHOP. I recommended for him a different regimen including treatment with obinutuzumab as well as acalabrutinib.  I discussed with the patient the adverse effect of this treatment. He is expected to start the first infusion with obinutuzumab next week. We will see him back for follow-up visit next week for evaluation and close  monitoring of his condition. The patient was also advised to call immediately if he has any other concerning symptoms in the interval.  Disclaimer: This note was dictated with voice recognition software. Similar sounding words can inadvertently be transcribed and may be missed upon review. Eilleen Kempf, MD 10/15/19

## 2019-10-15 NOTE — Telephone Encounter (Signed)
Oral Oncology Pharmacist Encounter  Received new prescription for acalabrutinib (Calquence) for the treatment of stage IV small lymphocytic lymphoma/CLL in conjunction with obinutuzumab, acalabrutinib's planned duration until disease progression or unacceptable toxicity.  Plan for patient to start acalabrutinib 100 mg PO twice daily. Prescription dose and frequency assessed and are appropriate for initiation.    BMP and CBC from 10/15/19 assessed:  Patient with persistent thrombocytopenia (pltc 66,000) - noted that platelet count has been < 100K over the last two months. Patient has been receiving prednisone for possible immune mediated anemia and thrombocytopenia per MD notes. No issues with bleeding noted during patients office visit on 10/15/19. Hemorrhagic events have been reported in patients on acalabrutinib - will counsel patient and continue to monitor pltc throughout therapy. No dose adjustments necessary at this time  Patient's most recent Hgb 8.6, pt has required intermittent treatment with PRBC (last treated 10/14/19 with 2 units). Acalabrutinib may also contribute to anemia. No dose adjustments necessary at this time. Will monitor patient's CBC throughout therapy.    Current medication list in Epic reviewed, DDIs with acalbrutinib identified:  Category X drug-drug interaction between acalabrutinib and omeprazole - omeprazole can decrease the serum concentration of acalabrutinib, making treatment less effective. Concomitant use is not recommended. Will discuss interaction with patient and if patient is willing to discontinue PPI at this time.  Prescription has been [facility]-scribed to the [facility] for benefits analysis and approval.  Oral Oncology Clinic will continue to follow for insurance authorization, copayment issues, initial counseling and start date.  Leron Croak, PharmD, [place] PGY2 Hematology/Oncology Pharmacy Resident 10/15/2019 1:15 PM Oral Oncology  Clinic [phone]

## 2019-10-15 NOTE — Progress Notes (Signed)
START OFF PATHWAY REGIMEN - Lymphoma and CLL   [id number]:Obinutuzumab 1,000 mg IV D1 q56 Days x 6 Cycles (Maintenance-year 2):   A cycle is every 56 days:     Obinutuzumab   **Always confirm dose/schedule in your pharmacy ordering system**  Patient Characteristics: Small Lymphocytic Lymphoma (SLL), First Line, Treatment Indicated, 17p del(-)/Unknown and ATM Mutation Negative/Unknown and TP53 Mutation Negative/Unknown, Age < 54 and Fit, IGHV Mutation (+), Candidate for Aggressive Chemoimmunotherapy Disease Type: Small Lymphocytic Lymphoma (SLL) Disease Type: Not Applicable Disease Type: Not Applicable [place] Stage: IV Line of Therapy: First Line Treatment Indicated<= Treatment Indicated ATM Mutation Status: Unknown 17p Deletion Status: Unknown TP53 Mutation Status: Unknown Patient Age: < 60 Patient Condition: Fit Patient IGHV Mutation Status: IGHV Mutation (+) Intent of Therapy: Curative Intent, Discussed with Patient

## 2019-10-18 ENCOUNTER — Other Ambulatory Visit: Payer: Self-pay | Admitting: *Deleted

## 2019-10-18 ENCOUNTER — Telehealth: Payer: Self-pay | Admitting: *Deleted

## 2019-10-18 MED ORDER — FUROSEMIDE 20 MG PO TABS: 20.0000 mg | ORAL_TABLET | Freq: Every day | ORAL | 0 refills | Status: DC

## 2019-10-18 NOTE — Telephone Encounter (Signed)
Okay to refill.  Thank you 

## 2019-10-18 NOTE — Telephone Encounter (Signed)
Received call from pt's sister requesting a refill on his Lasix 20 mg.  This ordered at the time of hospital discharge in early December.  He does not have a PCP.  She states that Dr. Julien Nordmann fills all his medications.

## 2019-10-20 ENCOUNTER — Telehealth: Payer: Self-pay | Admitting: Medical Oncology

## 2019-10-20 ENCOUNTER — Telehealth: Payer: Self-pay | Admitting: Physician Assistant

## 2019-10-20 NOTE — Telephone Encounter (Signed)
Scheduled appt per 1/20 sch message - [name] aware of appts for 1/21

## 2019-10-20 NOTE — Telephone Encounter (Signed)
Pt cannot  come tomorrow at 0745 due to transportation. Schedule message sent to r/s to later time. Pt does not need to see [facility] .

## 2019-10-21 ENCOUNTER — Other Ambulatory Visit: Payer: Self-pay

## 2019-10-21 ENCOUNTER — Inpatient Hospital Stay: Payer: Medicaid Other

## 2019-10-21 ENCOUNTER — Inpatient Hospital Stay: Payer: Medicaid Other | Admitting: Physician Assistant

## 2019-10-21 DIAGNOSIS — D701 Agranulocytosis secondary to cancer chemotherapy: Secondary | ICD-10-CM

## 2019-10-21 DIAGNOSIS — Z95828 Presence of other vascular implants and grafts: Secondary | ICD-10-CM

## 2019-10-21 DIAGNOSIS — Z5111 Encounter for antineoplastic chemotherapy: Secondary | ICD-10-CM | POA: Diagnosis not present

## 2019-10-21 DIAGNOSIS — D6481 Anemia due to antineoplastic chemotherapy: Secondary | ICD-10-CM

## 2019-10-21 DIAGNOSIS — T451X5A Adverse effect of antineoplastic and immunosuppressive drugs, initial encounter: Secondary | ICD-10-CM

## 2019-10-21 DIAGNOSIS — C8308 Small cell B-cell lymphoma, lymph nodes of multiple sites: Secondary | ICD-10-CM

## 2019-10-21 LAB — CBC WITH DIFFERENTIAL (CANCER CENTER ONLY)
Abs Immature Granulocytes: 0 10*3/uL (ref 0.00–0.07)
Basophils Absolute: 0 10*3/uL (ref 0.0–0.1)
Basophils Relative: 0 %
Eosinophils Absolute: 0 10*3/uL (ref 0.0–0.5)
Eosinophils Relative: 0 %
HCT: 23.9 % — ABNORMAL LOW (ref 39.0–52.0)
Hemoglobin: 7.7 g/dL — ABNORMAL LOW (ref 13.0–17.0)
Lymphocytes Relative: 14 %
Lymphs Abs: 0.3 10*3/uL — ABNORMAL LOW (ref 0.7–4.0)
MCH: 28.4 pg (ref 26.0–34.0)
MCHC: 32.2 g/dL (ref 30.0–36.0)
MCV: 88.2 fL (ref 80.0–100.0)
Metamyelocytes Relative: 1 %
Monocytes Absolute: 0.1 10*3/uL (ref 0.1–1.0)
Monocytes Relative: 6 %
Neutro Abs: 1.7 10*3/uL (ref 1.7–7.7)
Neutrophils Relative %: 79 %
Platelet Count: 48 10*3/uL — ABNORMAL LOW (ref 150–400)
RBC: 2.71 MIL/uL — ABNORMAL LOW (ref 4.22–5.81)
RDW: 19.1 % — ABNORMAL HIGH (ref 11.5–15.5)
WBC Count: 2.1 10*3/uL — ABNORMAL LOW (ref 4.0–10.5)
nRBC: 0 % (ref 0.0–0.2)

## 2019-10-21 LAB — CMP (CANCER CENTER ONLY)
ALT: 52 U/L — ABNORMAL HIGH (ref 0–44)
AST: 30 U/L (ref 15–41)
Albumin: 1.9 g/dL — ABNORMAL LOW (ref 3.5–5.0)
Alkaline Phosphatase: 460 U/L — ABNORMAL HIGH (ref 38–126)
Anion gap: 9 (ref 5–15)
BUN: 16 mg/dL (ref 6–20)
CO2: 23 mmol/L (ref 22–32)
Calcium: 8.5 mg/dL — ABNORMAL LOW (ref 8.9–10.3)
Chloride: 102 mmol/L (ref 98–111)
Creatinine: 0.62 mg/dL (ref 0.61–1.24)
GFR, Est AFR Am: >60 mL/min (ref >60)
GFR, Estimated: >60 mL/min (ref >60)
Glucose, Bld: 150 mg/dL — ABNORMAL HIGH (ref 70–99)
Potassium: 4.3 mmol/L (ref 3.5–5.1)
Sodium: 134 mmol/L — ABNORMAL LOW (ref 135–145)
Total Bilirubin: 0.5 mg/dL (ref 0.3–1.2)
Total Protein: 4.5 g/dL — ABNORMAL LOW (ref 6.5–8.1)

## 2019-10-21 LAB — LACTATE DEHYDROGENASE: LDH: 553 U/L — ABNORMAL HIGH (ref 98–192)

## 2019-10-21 LAB — SAMPLE TO BLOOD BANK

## 2019-10-21 LAB — URIC ACID: Uric Acid, Serum: 4.7 mg/dL (ref 3.7–8.6)

## 2019-10-21 MED ORDER — SODIUM CHLORIDE 0.9% FLUSH
10.0000 mL | INTRAVENOUS | Status: DC | PRN
  Administered 2019-10-21: 10 mL via INTRAVENOUS
  Filled 2019-10-21: qty 10

## 2019-10-21 MED ORDER — HEPARIN SOD (PORK) LOCK FLUSH 100 UNIT/ML IV SOLN
500.0000 [IU] | Freq: Once | INTRAVENOUS | Status: AC
  Administered 2019-10-21: 500 [IU] via INTRAVENOUS
  Filled 2019-10-21: qty 5

## 2019-10-21 NOTE — Progress Notes (Signed)
Lab called and spoke with Corene Cornea and asked if cbc was drawn for patient. I was not given a purple tube for patients cbc. Lab also stated to [facility] LPN that there was no sticker printed for a cbc to be drawn as well.

## 2019-10-22 ENCOUNTER — Other Ambulatory Visit: Payer: Self-pay | Admitting: Medical

## 2019-10-22 ENCOUNTER — Encounter: Payer: Self-pay | Admitting: *Deleted

## 2019-10-22 ENCOUNTER — Inpatient Hospital Stay: Payer: Medicaid Other

## 2019-10-22 ENCOUNTER — Other Ambulatory Visit: Payer: Self-pay

## 2019-10-22 VITALS — BP 113/73 | HR 128 | Temp 98.2°F | Resp 17

## 2019-10-22 DIAGNOSIS — G893 Neoplasm related pain (acute) (chronic): Secondary | ICD-10-CM

## 2019-10-22 DIAGNOSIS — Z5111 Encounter for antineoplastic chemotherapy: Secondary | ICD-10-CM | POA: Diagnosis not present

## 2019-10-22 DIAGNOSIS — C8308 Small cell B-cell lymphoma, lymph nodes of multiple sites: Secondary | ICD-10-CM

## 2019-10-22 MED ORDER — SODIUM CHLORIDE 0.9% FLUSH
10.0000 mL | INTRAVENOUS | Status: DC | PRN
  Administered 2019-10-22: 10 mL
  Filled 2019-10-22: qty 10

## 2019-10-22 MED ORDER — DIPHENHYDRAMINE HCL 50 MG/ML IJ SOLN
INTRAMUSCULAR | Status: AC
  Filled 2019-10-22: qty 1

## 2019-10-22 MED ORDER — SODIUM CHLORIDE 0.9 % IV SOLN
Freq: Once | INTRAVENOUS | Status: AC
  Filled 2019-10-22: qty 250

## 2019-10-22 MED ORDER — HEPARIN SOD (PORK) LOCK FLUSH 100 UNIT/ML IV SOLN
500.0000 [IU] | Freq: Once | INTRAVENOUS | Status: AC | PRN
  Administered 2019-10-22: 500 [IU]
  Filled 2019-10-22: qty 5

## 2019-10-22 MED ORDER — OXYCODONE-ACETAMINOPHEN 5-325 MG PO TABS
1.0000 | ORAL_TABLET | Freq: Once | ORAL | Status: AC
  Administered 2019-10-22: 1 via ORAL

## 2019-10-22 MED ORDER — ACETAMINOPHEN 325 MG PO TABS
650.0000 mg | ORAL_TABLET | Freq: Once | ORAL | Status: AC
  Administered 2019-10-22: 650 mg via ORAL

## 2019-10-22 MED ORDER — OXYCODONE-ACETAMINOPHEN 5-325 MG PO TABS
ORAL_TABLET | ORAL | Status: AC
  Filled 2019-10-22: qty 1

## 2019-10-22 MED ORDER — ACETAMINOPHEN 325 MG PO TABS
ORAL_TABLET | ORAL | Status: AC
  Filled 2019-10-22: qty 2

## 2019-10-22 MED ORDER — DIPHENHYDRAMINE HCL 50 MG/ML IJ SOLN
50.0000 mg | Freq: Once | INTRAMUSCULAR | Status: AC
  Administered 2019-10-22: 50 mg via INTRAVENOUS

## 2019-10-22 MED ORDER — SODIUM CHLORIDE 0.9 % IV SOLN
100.0000 mg | Freq: Once | INTRAVENOUS | Status: AC
  Administered 2019-10-22: 100 mg via INTRAVENOUS
  Filled 2019-10-22: qty 4

## 2019-10-22 MED ORDER — SODIUM CHLORIDE 0.9 % IV SOLN
20.0000 mg | Freq: Once | INTRAVENOUS | Status: AC
  Administered 2019-10-22: 20 mg via INTRAVENOUS
  Filled 2019-10-22: qty 20

## 2019-10-22 NOTE — Patient Instructions (Addendum)
[facility] °Discharge Instructions for Patients Receiving Chemotherapy ° °Today you received the following chemotherapy agents Gazyva ° °To help prevent nausea and vomiting after your treatment, we encourage you to take your nausea medication as directed °  °If you develop nausea and vomiting that is not controlled by your nausea medication, call the clinic.  ° °BELOW ARE SYMPTOMS THAT SHOULD BE REPORTED IMMEDIATELY: °· *FEVER GREATER THAN 100.5 F °· *CHILLS WITH OR WITHOUT FEVER °· NAUSEA AND VOMITING THAT IS NOT CONTROLLED WITH YOUR NAUSEA MEDICATION °· *UNUSUAL SHORTNESS OF BREATH °· *UNUSUAL BRUISING OR BLEEDING °· TENDERNESS IN MOUTH AND THROAT WITH OR WITHOUT PRESENCE OF ULCERS °· *URINARY PROBLEMS °· *BOWEL PROBLEMS °· UNUSUAL RASH °Items with * indicate a potential emergency and should be followed up as soon as possible. ° °Feel free to call the clinic should you have any questions or concerns. The clinic phone number is [phone]. ° °Please show the CHEMO ALERT CARD at check-in to the Emergency Department and triage nurse. ° °Obinutuzumab injection °What is this medicine? °OBINUTUZUMAB (OH bi nue TOOZ ue mab) is a monoclonal antibody. It is used to treat chronic lymphocytic leukemia (CLL) and a type of non-Hodgkin lymphoma (NHL), follicular lymphoma. °This medicine may be used for other purposes; ask your health care provider or pharmacist if you have questions. °COMMON BRAND NAME(S): GAZYVA °What should I tell my health care provider before I take this medicine? °They need to know if you have any of these conditions: °· infection (especially a virus infection such as hepatitis B virus) °· lung or breathing disease °· heart disease °· take medicines that treat or prevent blood clots °· an unusual or allergic reaction to obinutuzumab, other medicines, foods, dyes, or preservatives °· pregnant or trying to get pregnant °· breast-feeding °How should I use this medicine? °This medicine is  for infusion into a vein. It is given by a health care professional in a hospital or clinic setting. °Talk to your pediatrician regarding the use of this medicine in children. Special care may be needed. °Overdosage: If you think you have taken too much of this medicine contact a poison control center or emergency room at once. °NOTE: This medicine is only for you. Do not share this medicine with others. °What if I miss a dose? °Keep appointments for follow-up doses as directed. It is important not to miss your dose. Call your doctor or health care professional if you are unable to keep an appointment. °What may interact with this medicine? °· live virus vaccines °This list may not describe all possible interactions. Give your health care provider a list of all the medicines, herbs, non-prescription drugs, or dietary supplements you use. Also tell them if you smoke, drink alcohol, or use illegal drugs. Some items may interact with your medicine. °What should I watch for while using this medicine? °Report any side effects that you notice during your treatment right away, such as changes in your breathing, fever, chills, dizziness or lightheadedness. These effects are more common with the first dose. °Visit your prescriber or health care professional for checks on your progress. You will need to have regular blood work. Report any other side effects. The side effects of this medicine can continue after you finish your treatment. Continue your course of treatment even though you feel ill unless your doctor tells you to stop. °Call your doctor or health care professional for advice if you get a fever, chills or sore throat, or other   symptoms of a cold or flu. Do not treat yourself. This drug decreases your body's ability to fight infections. Try to avoid being around people who are sick. °This medicine may increase your risk to bruise or bleed. Call your doctor or health care professional if you notice any unusual  bleeding. °Do not become pregnant while taking this medicine or for 6 months after stopping it. Women should inform their doctor if they wish to become pregnant or think they might be pregnant. There is a potential for serious side effects to an unborn child. Talk to your health care professional or pharmacist for more information. Do not breast-feed an infant while taking this medicine or for 6 months after stopping it. °What side effects may I notice from receiving this medicine? °Side effects that you should report to your doctor or health care professional as soon as possible: °· allergic reactions like skin rash, itching or hives, swelling of the face, lips, or tongue °· breathing problems °· changes in vision °· chest pain or chest tightness °· confusion °· dizziness °· loss of balance or coordination °· low blood counts - this medicine may decrease the number of white blood cells, red blood cells and platelets. You may be at increased risk for infections and bleeding. °· signs of decreased platelets or bleeding - bruising, pinpoint red spots on the skin, black, tarry stools, blood in the urine °· signs of infection - fever or chills, cough, sore throat, pain or trouble passing urine °· signs and symptoms of liver injury like dark yellow or brown urine; general ill feeling or flu-like symptoms; light-colored stools; loss of appetite; nausea; right upper belly pain; unusually weak or tired; yellowing of the eyes or skin °· trouble speaking or understanding °· trouble walking °· vomiting °Side effects that usually do not require medical attention (report to your doctor or health care professional if they continue or are bothersome): °· constipation °· joint pain °· muscle pain °This list may not describe all possible side effects. Call your doctor for medical advice about side effects. You may report side effects to FDA at 1-800-FDA-1088. °Where should I keep my medicine? °This drug is only given in a hospital  or clinic and will not be stored at home. °NOTE: This sheet is a summary. It may not cover all possible information. If you have questions about this medicine, talk to your doctor, pharmacist, or health care provider. °© 2020 Elsevier/Gold Standard (2018-12-29 15:34:53) ° °

## 2019-10-22 NOTE — Progress Notes (Signed)
[facility] Work  Clinical Social Work was referred by infusion RN for assessment of psychosocial needs.  Clinical Social Worker met with patient in infusion room  to offer support and assess for needs.  Mr. Lewman reported concerns regarding applying for Social Security Disability.  The patient shared he's been unable to work since diagnosis and needs assistance paying for daily living expenses.  Mr. Haynie lives in [place] and rents a home through his family members.  Patient is currently receiving [place] and plans to receive assistance through financial advocate team. CSW discussed options for applying for SSDI- patient is in agreement to [place] referral.  CSW made referral on patient's behalf and contacted patient sister to provide more information.  CSW explored patient coping strategies and additional concerns/needs.  Mr. Barca reported feeling generally positive and discussed sources of strength including strong support from sister and father.  CSW encouraged patient to follow up as questions or needs arise.     Gwinda Maine, LCSW  Clinical Social Worker [facility]

## 2019-10-22 NOTE — Progress Notes (Signed)
Clarified [employer] schedule with MD. Patient is ok to receive Gazyva 100mg  today and delete Day 2 Gazyva 900mg .  Ok proceeding with 1000mg  on Day 8 as scheduled on 10/28/19.   Hardie Pulley, PharmD, BCPS, BCOP

## 2019-10-22 NOTE — Progress Notes (Signed)
Per Dr. Julien Nordmann, okay to treat with labs and HR 128.

## 2019-10-26 ENCOUNTER — Telehealth: Payer: Self-pay

## 2019-10-26 NOTE — Telephone Encounter (Signed)
Oral Oncology Patient Advocate Encounter  Met patient in [place] infusion room to complete [place] in an effort to reduce the patient's out of pocket expense for [place] to $0.    Application completed and faxed to [phone].   [place] patient [place] phone number for follow up is [phone].   This encounter will be updated until final determination.  [place] Patient [place] Phone [phone] Fax [phone] 10/26/2019 2:29 PM

## 2019-10-27 ENCOUNTER — Telehealth: Payer: Self-pay | Admitting: Medical Oncology

## 2019-10-28 ENCOUNTER — Inpatient Hospital Stay: Payer: Medicaid Other

## 2019-10-28 ENCOUNTER — Telehealth: Payer: Self-pay | Admitting: Medical Oncology

## 2019-10-28 ENCOUNTER — Encounter: Payer: Self-pay | Admitting: Medical

## 2019-10-28 ENCOUNTER — Other Ambulatory Visit: Payer: Self-pay | Admitting: Emergency Medicine

## 2019-10-28 ENCOUNTER — Other Ambulatory Visit: Payer: Self-pay

## 2019-10-28 ENCOUNTER — Inpatient Hospital Stay (HOSPITAL_BASED_OUTPATIENT_CLINIC_OR_DEPARTMENT_OTHER): Payer: Medicaid Other | Admitting: Medical

## 2019-10-28 VITALS — BP 138/64 | HR 139 | Temp 98.0°F | Resp 24 | Ht 71.0 in | Wt 159.6 lb

## 2019-10-28 DIAGNOSIS — T451X5A Adverse effect of antineoplastic and immunosuppressive drugs, initial encounter: Secondary | ICD-10-CM

## 2019-10-28 DIAGNOSIS — D630 Anemia in neoplastic disease: Secondary | ICD-10-CM

## 2019-10-28 DIAGNOSIS — D701 Agranulocytosis secondary to cancer chemotherapy: Secondary | ICD-10-CM

## 2019-10-28 DIAGNOSIS — E876 Hypokalemia: Secondary | ICD-10-CM | POA: Diagnosis not present

## 2019-10-28 DIAGNOSIS — D649 Anemia, unspecified: Secondary | ICD-10-CM

## 2019-10-28 DIAGNOSIS — R0602 Shortness of breath: Secondary | ICD-10-CM | POA: Diagnosis not present

## 2019-10-28 DIAGNOSIS — C8308 Small cell B-cell lymphoma, lymph nodes of multiple sites: Secondary | ICD-10-CM

## 2019-10-28 DIAGNOSIS — C801 Malignant (primary) neoplasm, unspecified: Secondary | ICD-10-CM

## 2019-10-28 DIAGNOSIS — Z95828 Presence of other vascular implants and grafts: Secondary | ICD-10-CM | POA: Insufficient documentation

## 2019-10-28 LAB — CMP (CANCER CENTER ONLY)
ALT: 57 U/L — ABNORMAL HIGH (ref 0–44)
AST: 44 U/L — ABNORMAL HIGH (ref 15–41)
Albumin: 2 g/dL — ABNORMAL LOW (ref 3.5–5.0)
Alkaline Phosphatase: 216 U/L — ABNORMAL HIGH (ref 38–126)
Anion gap: 11 (ref 5–15)
BUN: 19 mg/dL (ref 6–20)
CO2: 25 mmol/L (ref 22–32)
Calcium: 8.3 mg/dL — ABNORMAL LOW (ref 8.9–10.3)
Chloride: 105 mmol/L (ref 98–111)
Creatinine: 0.61 mg/dL (ref 0.61–1.24)
GFR, Est AFR Am: >60 mL/min (ref >60)
GFR, Estimated: >60 mL/min (ref >60)
Glucose, Bld: 104 mg/dL — ABNORMAL HIGH (ref 70–99)
Potassium: 3.3 mmol/L — ABNORMAL LOW (ref 3.5–5.1)
Sodium: 141 mmol/L (ref 135–145)
Total Bilirubin: 0.6 mg/dL (ref 0.3–1.2)
Total Protein: 4.7 g/dL — ABNORMAL LOW (ref 6.5–8.1)

## 2019-10-28 LAB — CBC WITH DIFFERENTIAL (CANCER CENTER ONLY)
Abs Immature Granulocytes: 0.02 10*3/uL (ref 0.00–0.07)
Basophils Absolute: 0 10*3/uL (ref 0.0–0.1)
Basophils Relative: 0 %
Eosinophils Absolute: 0 10*3/uL (ref 0.0–0.5)
Eosinophils Relative: 0 %
HCT: 22.3 % — ABNORMAL LOW (ref 39.0–52.0)
Hemoglobin: 7.1 g/dL — ABNORMAL LOW (ref 13.0–17.0)
Immature Granulocytes: 1 %
Lymphocytes Relative: 3 %
Lymphs Abs: 0.1 10*3/uL — ABNORMAL LOW (ref 0.7–4.0)
MCH: 28.3 pg (ref 26.0–34.0)
MCHC: 31.8 g/dL (ref 30.0–36.0)
MCV: 88.8 fL (ref 80.0–100.0)
Monocytes Absolute: 0.3 10*3/uL (ref 0.1–1.0)
Monocytes Relative: 9 %
Neutro Abs: 2.4 10*3/uL (ref 1.7–7.7)
Neutrophils Relative %: 87 %
Platelet Count: 87 10*3/uL — ABNORMAL LOW (ref 150–400)
RBC: 2.51 MIL/uL — ABNORMAL LOW (ref 4.22–5.81)
RDW: 18.3 % — ABNORMAL HIGH (ref 11.5–15.5)
WBC Count: 2.8 10*3/uL — ABNORMAL LOW (ref 4.0–10.5)
nRBC: 0 % (ref 0.0–0.2)

## 2019-10-28 LAB — URIC ACID: Uric Acid, Serum: 4.7 mg/dL (ref 3.7–8.6)

## 2019-10-28 LAB — PREPARE RBC (CROSSMATCH)

## 2019-10-28 LAB — LACTATE DEHYDROGENASE: LDH: 501 U/L — ABNORMAL HIGH (ref 98–192)

## 2019-10-28 LAB — SAMPLE TO BLOOD BANK

## 2019-10-28 MED ORDER — SODIUM CHLORIDE 0.9% FLUSH
10.0000 mL | Freq: Once | INTRAVENOUS | Status: AC
  Administered 2019-10-28 (×2): 10 mL
  Filled 2019-10-28: qty 10

## 2019-10-28 MED ORDER — ALBUTEROL SULFATE HFA 108 (90 BASE) MCG/ACT IN AERS
INHALATION_SPRAY | RESPIRATORY_TRACT | Status: AC
  Filled 2019-10-28: qty 6.7

## 2019-10-28 MED ORDER — ALBUTEROL SULFATE HFA 108 (90 BASE) MCG/ACT IN AERS
1.0000 | INHALATION_SPRAY | RESPIRATORY_TRACT | Status: DC
  Administered 2019-10-28: 2 via RESPIRATORY_TRACT

## 2019-10-28 MED ORDER — POTASSIUM CHLORIDE CRYS ER 20 MEQ PO TBCR
40.0000 meq | EXTENDED_RELEASE_TABLET | Freq: Once | ORAL | Status: AC
  Administered 2019-10-28: 40 meq via ORAL

## 2019-10-28 MED ORDER — POTASSIUM CHLORIDE CRYS ER 20 MEQ PO TBCR
EXTENDED_RELEASE_TABLET | ORAL | Status: AC
  Filled 2019-10-28: qty 2

## 2019-10-28 MED ORDER — ACETAMINOPHEN 325 MG PO TABS
650.0000 mg | ORAL_TABLET | Freq: Once | ORAL | Status: AC
  Administered 2019-10-28: 650 mg via ORAL

## 2019-10-28 MED ORDER — SODIUM CHLORIDE 0.9% IV SOLUTION
250.0000 mL | Freq: Once | INTRAVENOUS | Status: AC
  Administered 2019-10-28: 250 mL via INTRAVENOUS
  Filled 2019-10-28: qty 250

## 2019-10-28 MED ORDER — DIPHENHYDRAMINE HCL 50 MG/ML IJ SOLN
INTRAMUSCULAR | Status: AC
  Filled 2019-10-28: qty 1

## 2019-10-28 MED ORDER — DIPHENHYDRAMINE HCL 50 MG/ML IJ SOLN
25.0000 mg | Freq: Once | INTRAMUSCULAR | Status: AC
  Administered 2019-10-28: 25 mg via INTRAVENOUS

## 2019-10-28 MED ORDER — HEPARIN SOD (PORK) LOCK FLUSH 100 UNIT/ML IV SOLN
250.0000 [IU] | INTRAVENOUS | Status: DC | PRN
  Filled 2019-10-28: qty 5

## 2019-10-28 MED ORDER — DIPHENHYDRAMINE HCL 25 MG PO CAPS
ORAL_CAPSULE | ORAL | Status: AC
  Filled 2019-10-28: qty 1

## 2019-10-28 MED ORDER — LIDOCAINE-PRILOCAINE 2.5-2.5 % EX CREA: 1.0000 "application " | TOPICAL_CREAM | CUTANEOUS | 2 refills | Status: AC | PRN

## 2019-10-28 MED ORDER — HEPARIN SOD (PORK) LOCK FLUSH 100 UNIT/ML IV SOLN
500.0000 [IU] | Freq: Every day | INTRAVENOUS | Status: AC | PRN
  Administered 2019-10-28: 500 [IU]
  Filled 2019-10-28: qty 5

## 2019-10-28 MED ORDER — ACETAMINOPHEN 325 MG PO TABS
ORAL_TABLET | ORAL | Status: AC
  Filled 2019-10-28: qty 2

## 2019-10-28 MED FILL — LIDOCAINE-PRILOCAINE CREAM: 2.5-2.5 | 30 days supply | Qty: 30 | Fill #0

## 2019-10-28 NOTE — Telephone Encounter (Signed)
Attempted to  set up appts for 1/28.

## 2019-10-28 NOTE — Telephone Encounter (Signed)
Pt spoke to [place] in transportation and confirmed appt for tomorrow. LVM on Laurie's phone that pt confirmed transportation for tomorrow am.

## 2019-10-28 NOTE — Progress Notes (Signed)
Symptoms Management Clinic Progress Note   Isaac Pierce [id number] 1961-06-11 59 y.o.  Isaac Pierce is managed by Dr. Fanny Bien. Isaac Pierce  Actively treated with chemotherapy/immunotherapy/hormonal therapy: yes  Current therapy: obinutuzumab (GAZYVA) IV and oral prednisone. Awaiting start of Acalabrutinib  Last treated: 10/22/2019 (cycle #1)  Next scheduled appointment with provider: 11/18/2019  Assessment: Plan:    Symptomatic anemia - Plan: Practitioner attestation of consent, Complete patient signature process for consent form, Type and screen, Care order/instruction, 0.9 %  sodium chloride infusion (Manually program via Guardrails IV Fluids), heparin lock flush 100 unit/mL, heparin lock flush 100 unit/mL, Prepare RBC, Transfuse RBC, acetaminophen (TYLENOL) tablet 650 mg, diphenhydrAMINE (BENADRYL) injection 25 mg, Prepare RBC, Type and screen, Transfuse RBC  Shortness of breath - Plan: albuterol (VENTOLIN HFA) 108 (90 Base) MCG/ACT inhaler 1-2 puff  Hypokalemia - Plan: potassium chloride SA (KLOR-CON) CR tablet 40 mEq  Anemia in neoplastic disease  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 The patient was given 1 unit of PRBC's today. He may need to return on either Saturday or early next week for another transfusion based on his symptoms.  Shortness of breath: Patient was given albuterol HFA inhaler 2 puffs x 1.  Hypokalemia: The patient's labs today returned showing a potassium of 3.3.  He was given potassium chloride 20 mEq 2 tablets p.o. x 1  Small B-cell lymphoma: Patient continues to be managed by Dr. Earlie Server and is status post cycle 1 of obinutuzumab (GAZYVA) IV and is awaiting the start of oral prednisone and Acalabrutinib.  Please see After Visit Summary for patient specific instructions.  Future Appointments  Date Time Provider [place]  10/29/2019  7:30 AM [facility]  INFUSION [facility] None  11/04/2019 11:00 AM [facility] LAB/FLUSH [facility] None  11/04/2019 11:15 AM [facility] FLUSH [facility] None  11/04/2019 12:00 PM [facility] INFUSION [facility] None  11/11/2019  2:00 PM [facility] LAB 2 [facility] None  11/11/2019  2:15 PM [facility] [place] FLUSH [facility] None  11/18/2019  8:00 AM [facility] LAB 2 [facility] None  11/18/2019  8:15 AM [facility] [place] FLUSH [facility] None  11/18/2019  9:00 AM Heilingoetter, Cassandra L, PA-C [facility] None  11/18/2019 10:15 AM [facility] INFUSION [facility] None    Orders Placed This Encounter  Procedures   Practitioner attestation of consent   Complete patient signature process for consent form   Care order/instruction   Type and screen   Prepare RBC       Subjective:   Patient ID:  Isaac Pierce is a 59 y.o. (DOB Dec 08, 1960) male.  Chief Complaint:  Chief Complaint  Patient presents with   Fatigue    HPI Isaac Pierce Is a 59 y.o. male with a diagnosis of a small B-cell lymphoma. He continues to be managed by Dr. Julien Nordmann and is status post cycle 1 of obinutuzumab (GAZYVA) IV. He is awaiting the start of oral prednisone and Acalabrutinib.  He was supposed to have been here earlier today for consideration of cycle 2 of obinutuzumab (GAZYVA) IV however the [place] driver that was supposed to pick him up did not come.  He was able to come in a day.  He reports fatigue, increasing shortness of breath and dyspnea on exertion and overall weakness.  He has been without Lasix for several weeks and reports that he has had progressive bilateral lower extremity edema and shortness of breath.  He was not able to  get this prescription until last evening due to no resources available. He restarted Lasix last evening.  He has continued bilateral lower extremity edema.  He requests a refill of his albuterol inhaler and EMLA cream.  CBC returned today with a hemoglobin of 7.1.  Plan are to have him return tomorrow for consideration  of his second cycle of obinutuzumab (GAZYVA) IV.  Medications: I have reviewed the patient's current medications.  Allergies:  Allergies  Allergen Reactions   Rituximab Rash    Had rash, anxiety, HTN & tachycardia ~55min into 1st infusion 08/27/19    Past Medical History:  Diagnosis Date   Anxiety    Back pain    GERD (gastroesophageal reflux disease)     Past Surgical History:  Procedure Laterality Date   IR IMAGING GUIDED PORT INSERTION  08/30/2019    Family History  Problem Relation Age of Onset   Lymphoma Mother    Lymphoma Father     Social History   Socioeconomic History   Marital status: Single    Spouse name: Not on file   Number of children: Not on file   Years of education: Not on file   Highest education level: Not on file  Occupational History   Not on file  Tobacco Use   Smoking status: Current Every Day Smoker    Types: Cigarettes   Smokeless tobacco: Never Used  Substance and Sexual Activity   Alcohol use: Not Currently   Drug use: Not Currently   Sexual activity: Not on file  Other Topics Concern   Not on file  Social History Narrative   Not on file   Social Determinants of Health   Financial Resource Strain:    Difficulty of Paying Living Expenses: Not on file  Food Insecurity:    Worried About Running Out of Food in the Last Year: Not on file   Ran Out of Food in the Last Year: Not on file  Transportation Needs:    Lack of Transportation (Medical): Not on file   Lack of Transportation (Non-Medical): Not on file  Physical Activity:    Days of Exercise per Week: Not on file   Minutes of Exercise per Session: Not on file  Stress:    Feeling of Stress : Not on file  Social Connections:    Frequency of Communication with Friends and Family: Not on file   Frequency of Social Gatherings with Friends and Family: Not on file   Attends Religious Services: Not on file   Active Member of Clubs or  Organizations: Not on file   Attends Archivist Meetings: Not on file   Marital Status: Not on file  Intimate Partner Violence:    Fear of Current or Ex-Partner: Not on file   Emotionally Abused: Not on file   Physically Abused: Not on file   Sexually Abused: Not on file    Past Medical History, Surgical history, Social history, and Family history were reviewed and updated as appropriate.   Please see review of systems for further details on the patient's review from today.   Review of Systems:  Review of Systems  Constitutional: Positive for activity change, appetite change and fatigue. Negative for chills, diaphoresis and fever.  HENT: Negative for congestion, postnasal drip, rhinorrhea and sore throat.   Respiratory: Positive for cough and shortness of breath. Negative for wheezing.   Cardiovascular: Negative for palpitations.  Gastrointestinal: Positive for abdominal distention (Slightly improved), blood in stool, diarrhea and rectal pain (  Secondary to hemorrhoids). Negative for abdominal pain.  Neurological: Positive for weakness. Negative for headaches.    Objective:   Physical Exam:  BP 138/64    Pulse (!) 139 Comment: MD Isaac Pierce aware, PA Montay Vanvoorhis aware   Temp 98 F (36.7 C) (Oral)    Resp (!) 24    Ht 5\' 11"  (1.803 m)    Wt 159 lb 9.6 oz (72.4 kg)    SpO2 95%    BMI 22.26 kg/m  ECOG: 1  Physical Exam Constitutional:      General: He is not in acute distress.    Appearance: He is not diaphoretic.  HENT:     Head: Normocephalic and atraumatic.  Eyes:     General: No scleral icterus.       Right eye: No discharge.        Left eye: No discharge.  Cardiovascular:     Rate and Rhythm: Regular rhythm. Tachycardia present.     Heart sounds: Normal heart sounds. No murmur. No friction rub. No gallop.   Pulmonary:     Effort: Tachypnea present. No respiratory distress.     Breath sounds: Normal breath sounds. No wheezing or rales.  Abdominal:     General:  There is distension.     Tenderness: There is no abdominal tenderness. There is no guarding or rebound.  Musculoskeletal:     Cervical back: Normal range of motion and neck supple.     Right lower leg: Edema present.     Left lower leg: Edema present.     Comments: 2+ bilateral lower extremity pitting edema to the knees.  Lymphadenopathy:     Cervical: No cervical adenopathy.  Skin:    General: Skin is warm and dry.     Findings: No erythema or rash.  Neurological:     Mental Status: He is alert.     Coordination: Coordination normal.     Gait: Gait abnormal (The patient is ambulating with the use of a wheelchair.).  Psychiatric:     Comments: The patient intermittently argumentative.     Lab Review:     Component Value Date/Time   NA 141 10/28/2019 1233   K 3.3 (L) 10/28/2019 1233   CL 105 10/28/2019 1233   CO2 25 10/28/2019 1233   GLUCOSE 104 (H) 10/28/2019 1233   BUN 19 10/28/2019 1233   CREATININE 0.61 10/28/2019 1233   CALCIUM 8.3 (L) 10/28/2019 1233   PROT 4.7 (L) 10/28/2019 1233   ALBUMIN 2.0 (L) 10/28/2019 1233   AST 44 (H) 10/28/2019 1233   ALT 57 (H) 10/28/2019 1233   ALKPHOS 216 (H) 10/28/2019 1233   BILITOT 0.6 10/28/2019 1233   GFRNONAA >60 10/28/2019 1233   GFRAA >60 10/28/2019 1233       Component Value Date/Time   WBC 2.8 (L) 10/28/2019 1233   WBC 1.5 (L) 09/06/2019 0327   RBC 2.51 (L) 10/28/2019 1233   HGB 7.1 (L) 10/28/2019 1233   HCT 22.3 (L) 10/28/2019 1233   PLT 87 (L) 10/28/2019 1233   MCV 88.8 10/28/2019 1233   MCH 28.3 10/28/2019 1233   MCHC 31.8 10/28/2019 1233   RDW 18.3 (H) 10/28/2019 1233   LYMPHSABS 0.1 (L) 10/28/2019 1233   MONOABS 0.3 10/28/2019 1233   EOSABS 0.0 10/28/2019 1233   BASOSABS 0.0 10/28/2019 1233   -------------------------------  Imaging from last 24 hours (if applicable):  Radiology interpretation: No results found.      This patient was seen with  Dr. Julien Nordmann with my treatment plan reviewed with him. He  expressed agreement with my medical management of this patient.   ADDENDUM: Hematology/Oncology Attending: I had a face-to-face encounter with the patient today.  I recommended his care plan.  This is a very pleasant 59 years old white male diagnosed with a stage IV CLL/small lymphocytic lymphoma presented with bulky lymphadenopathy in the chest, abdomen and pelvis as well as hepatosplenomegaly and bone marrow involvement.  The patient was initially treated with 2 cycles of CHOP but has no significant improvement in his condition.  I switched his treatment to obinutuzumab status post 1 cycle during last week and he tolerated the first treatment fairly well.  He was supposed to start day 8 of his treatment today but unfortunately with transportation issues he was unable to come on time for his treatment.  His treatment scheduled for tomorrow.  Repeat blood work today showed significant anemia as well as low-grade fever.  His white blood count as well as platelets count improved. We arrange for the patient to receive PRBCs transfusion today. I recommended for him to proceed with his treatment with obinutuzumab tomorrow.  He was also given prescription to start acalabrutinib last week but unfortunately it was not started yet.  He is expected to receive the treatment of his medication in the next few days and start treatment. We will continue to monitor him closely and the patient will come back next week for reevaluation and repeat blood work. He was advised to call immediately if he has any concerning symptoms in the interval. Disclaimer: This note was dictated with voice recognition software. Similar sounding words can inadvertently be transcribed and may be missed upon review. Eilleen Kempf, MD 10/28/19

## 2019-10-28 NOTE — Telephone Encounter (Signed)
Returned his sisters call  "Transportation did not show up at [phone], they said it would be 0700 -sister cancelled ride" schedule message sent to bring pt in later at 74 for labs and [place]. Sister aware of new appt.

## 2019-10-28 NOTE — Progress Notes (Signed)
MD [name]/PA Cassie/PA Lucianne Lei assessed pt in [facility] 26.  Aware of oral temp of 100.1 and HR 140s, VO per MD [name] to continue with blood transfusion.  [facility] RN expressed concerns about pt's slight change in mental status.  Pt becoming abnormally frustrated with food/drink/comfort measures provided and using abnormally aggressive language toward RN.  At beginning of transfusion pt made statement to same RN "you're my favorite and I'm so glad I have you today".  Pt also appears more restless than usual and is sweaty heavily.  A&Ox4.  No other neuro changes including slurred speech/facial droop.  MD [place] aware of RN concerns.  Will continue to monitor.  Blood transfusion finished, pt tolerated well.  VS stable, temp now 98.  MD [place] aware of last set of VS.  Pt is returning tomorrow to [place] for tx, transport has been set up by RN Diane.  Pt aware of appt times tomorrow, will keep on blue blood bracelet for tomorrow's visit.  Ok to d/c patient at this time.  Pt A&Ox4 at time of d/c, irritable but cooperative and calm.  Pt able to eat and drink during transfusion w/out any issues.  Niece picking pt up, pt w/c to exit with belongings.

## 2019-10-28 NOTE — Telephone Encounter (Signed)
Patient is approved for assistance through [place] and [place] for [place] 10/27/19-10/25/20.  [place] phone [phone]  [place] Patient [place] Phone [phone] Fax [phone] 10/28/2019 4:07 PM

## 2019-10-28 NOTE — Patient Instructions (Signed)

## 2019-10-29 ENCOUNTER — Other Ambulatory Visit: Payer: Self-pay

## 2019-10-29 ENCOUNTER — Inpatient Hospital Stay: Payer: Medicaid Other

## 2019-10-29 ENCOUNTER — Emergency Department (HOSPITAL_COMMUNITY): Payer: Medicaid Other

## 2019-10-29 ENCOUNTER — Inpatient Hospital Stay (HOSPITAL_COMMUNITY)
Admission: EM | Admit: 2019-10-29 | Discharge: 2019-11-12 | DRG: 189 | Disposition: A | Discharge status: Home Health | Payer: Medicaid Other | Source: Ambulatory Visit | Attending: Pulmonary Disease | Admitting: Pulmonary Disease

## 2019-10-29 ENCOUNTER — Encounter (HOSPITAL_COMMUNITY): Payer: Self-pay

## 2019-10-29 ENCOUNTER — Inpatient Hospital Stay (HOSPITAL_COMMUNITY): Payer: Medicaid Other

## 2019-10-29 ENCOUNTER — Inpatient Hospital Stay (HOSPITAL_BASED_OUTPATIENT_CLINIC_OR_DEPARTMENT_OTHER): Payer: Medicaid Other | Admitting: Medical

## 2019-10-29 VITALS — BP 153/85 | HR 149 | Temp 98.3°F | Resp 28

## 2019-10-29 DIAGNOSIS — R0902 Hypoxemia: Secondary | ICD-10-CM

## 2019-10-29 DIAGNOSIS — R162 Hepatomegaly with splenomegaly, not elsewhere classified: Secondary | ICD-10-CM | POA: Diagnosis present

## 2019-10-29 DIAGNOSIS — R739 Hyperglycemia, unspecified: Secondary | ICD-10-CM | POA: Diagnosis not present

## 2019-10-29 DIAGNOSIS — R0603 Acute respiratory distress: Secondary | ICD-10-CM

## 2019-10-29 DIAGNOSIS — M7989 Other specified soft tissue disorders: Secondary | ICD-10-CM | POA: Diagnosis not present

## 2019-10-29 DIAGNOSIS — K219 Gastro-esophageal reflux disease without esophagitis: Secondary | ICD-10-CM | POA: Diagnosis present

## 2019-10-29 DIAGNOSIS — C8308 Small cell B-cell lymphoma, lymph nodes of multiple sites: Secondary | ICD-10-CM

## 2019-10-29 DIAGNOSIS — F419 Anxiety disorder, unspecified: Secondary | ICD-10-CM | POA: Diagnosis present

## 2019-10-29 DIAGNOSIS — I959 Hypotension, unspecified: Secondary | ICD-10-CM | POA: Diagnosis not present

## 2019-10-29 DIAGNOSIS — I82462 Acute embolism and thrombosis of left calf muscular vein: Secondary | ICD-10-CM | POA: Diagnosis not present

## 2019-10-29 DIAGNOSIS — Z79899 Other long term (current) drug therapy: Secondary | ICD-10-CM

## 2019-10-29 DIAGNOSIS — M549 Dorsalgia, unspecified: Secondary | ICD-10-CM | POA: Diagnosis present

## 2019-10-29 DIAGNOSIS — I82452 Acute embolism and thrombosis of left peroneal vein: Secondary | ICD-10-CM | POA: Diagnosis not present

## 2019-10-29 DIAGNOSIS — R5383 Other fatigue: Secondary | ICD-10-CM | POA: Diagnosis not present

## 2019-10-29 DIAGNOSIS — Z9889 Other specified postprocedural states: Secondary | ICD-10-CM

## 2019-10-29 DIAGNOSIS — Z807 Family history of other malignant neoplasms of lymphoid, hematopoietic and related tissues: Secondary | ICD-10-CM

## 2019-10-29 DIAGNOSIS — T451X5A Adverse effect of antineoplastic and immunosuppressive drugs, initial encounter: Secondary | ICD-10-CM | POA: Diagnosis present

## 2019-10-29 DIAGNOSIS — E876 Hypokalemia: Secondary | ICD-10-CM | POA: Diagnosis not present

## 2019-10-29 DIAGNOSIS — R600 Localized edema: Secondary | ICD-10-CM | POA: Diagnosis not present

## 2019-10-29 DIAGNOSIS — Z888 Allergy status to other drugs, medicaments and biological substances status: Secondary | ICD-10-CM | POA: Diagnosis not present

## 2019-10-29 DIAGNOSIS — C9110 Chronic lymphocytic leukemia of B-cell type not having achieved remission: Secondary | ICD-10-CM | POA: Diagnosis not present

## 2019-10-29 DIAGNOSIS — Z20822 Contact with and (suspected) exposure to covid-19: Secondary | ICD-10-CM | POA: Diagnosis present

## 2019-10-29 DIAGNOSIS — J9601 Acute respiratory failure with hypoxia: Principal | ICD-10-CM | POA: Diagnosis present

## 2019-10-29 DIAGNOSIS — R0602 Shortness of breath: Secondary | ICD-10-CM | POA: Diagnosis not present

## 2019-10-29 DIAGNOSIS — Z9689 Presence of other specified functional implants: Secondary | ICD-10-CM

## 2019-10-29 DIAGNOSIS — C8598 Non-Hodgkin lymphoma, unspecified, lymph nodes of multiple sites: Secondary | ICD-10-CM | POA: Diagnosis present

## 2019-10-29 DIAGNOSIS — T7840XA Allergy, unspecified, initial encounter: Secondary | ICD-10-CM | POA: Diagnosis not present

## 2019-10-29 DIAGNOSIS — R634 Abnormal weight loss: Secondary | ICD-10-CM | POA: Diagnosis not present

## 2019-10-29 DIAGNOSIS — D649 Anemia, unspecified: Secondary | ICD-10-CM

## 2019-10-29 DIAGNOSIS — F17210 Nicotine dependence, cigarettes, uncomplicated: Secondary | ICD-10-CM | POA: Diagnosis present

## 2019-10-29 DIAGNOSIS — I82442 Acute embolism and thrombosis of left tibial vein: Secondary | ICD-10-CM | POA: Diagnosis not present

## 2019-10-29 DIAGNOSIS — J910 Malignant pleural effusion: Secondary | ICD-10-CM | POA: Diagnosis present

## 2019-10-29 DIAGNOSIS — R519 Headache, unspecified: Secondary | ICD-10-CM | POA: Diagnosis not present

## 2019-10-29 DIAGNOSIS — Z5111 Encounter for antineoplastic chemotherapy: Secondary | ICD-10-CM | POA: Diagnosis present

## 2019-10-29 DIAGNOSIS — I824Y2 Acute embolism and thrombosis of unspecified deep veins of left proximal lower extremity: Secondary | ICD-10-CM | POA: Diagnosis not present

## 2019-10-29 DIAGNOSIS — J90 Pleural effusion, not elsewhere classified: Secondary | ICD-10-CM

## 2019-10-29 DIAGNOSIS — D61810 Antineoplastic chemotherapy induced pancytopenia: Secondary | ICD-10-CM | POA: Diagnosis present

## 2019-10-29 DIAGNOSIS — D61818 Other pancytopenia: Secondary | ICD-10-CM | POA: Diagnosis not present

## 2019-10-29 DIAGNOSIS — I82432 Acute embolism and thrombosis of left popliteal vein: Secondary | ICD-10-CM | POA: Diagnosis not present

## 2019-10-29 DIAGNOSIS — Z7952 Long term (current) use of systemic steroids: Secondary | ICD-10-CM | POA: Diagnosis not present

## 2019-10-29 DIAGNOSIS — T380X5A Adverse effect of glucocorticoids and synthetic analogues, initial encounter: Secondary | ICD-10-CM | POA: Diagnosis not present

## 2019-10-29 DIAGNOSIS — R630 Anorexia: Secondary | ICD-10-CM | POA: Diagnosis not present

## 2019-10-29 LAB — POC SARS CORONAVIRUS 2 AG -  ED: SARS Coronavirus 2 Ag: NEGATIVE

## 2019-10-29 LAB — BPAM RBC
Blood Product Expiration Date: 202102252359
Blood Product Expiration Date: 202102262359
ISSUE DATE / TIME: 202101281428
ISSUE DATE / TIME: 202101281507
Unit Type and Rh: 5100
Unit Type and Rh: 5100

## 2019-10-29 LAB — RESPIRATORY PANEL BY PCR

## 2019-10-29 LAB — URINALYSIS, ROUTINE W REFLEX MICROSCOPIC
Bilirubin Urine: NEGATIVE
Glucose, UA: NEGATIVE mg/dL
Hgb urine dipstick: NEGATIVE
Ketones, ur: NEGATIVE mg/dL
Leukocytes,Ua: NEGATIVE
Nitrite: NEGATIVE
Protein, ur: NEGATIVE mg/dL
Specific Gravity, Urine: 1.01 (ref 1.005–1.030)
pH: 6 (ref 5.0–8.0)

## 2019-10-29 LAB — MRSA PCR SCREENING: MRSA by PCR: NEGATIVE

## 2019-10-29 LAB — CBC WITH DIFFERENTIAL/PLATELET
Abs Immature Granulocytes: 0.01 10*3/uL (ref 0.00–0.07)
Abs Immature Granulocytes: 0.02 10*3/uL (ref 0.00–0.07)
Basophils Absolute: 0 10*3/uL (ref 0.0–0.1)
Basophils Absolute: 0 10*3/uL (ref 0.0–0.1)
Basophils Relative: 0 %
Basophils Relative: 0 %
Eosinophils Absolute: 0 10*3/uL (ref 0.0–0.5)
Eosinophils Absolute: 0 10*3/uL (ref 0.0–0.5)
Eosinophils Relative: 0 %
Eosinophils Relative: 0 %
HCT: 25.3 % — ABNORMAL LOW (ref 39.0–52.0)
HCT: 23.8 % — ABNORMAL LOW (ref 39.0–52.0)
Hemoglobin: 8 g/dL — ABNORMAL LOW (ref 13.0–17.0)
Hemoglobin: 7.1 g/dL — ABNORMAL LOW (ref 13.0–17.0)
Immature Granulocytes: 0 %
Immature Granulocytes: 1 %
Lymphocytes Relative: 1 %
Lymphocytes Relative: 4 %
Lymphs Abs: 0 10*3/uL — ABNORMAL LOW (ref 0.7–4.0)
Lymphs Abs: 0.1 10*3/uL — ABNORMAL LOW (ref 0.7–4.0)
MCH: 29.4 pg (ref 26.0–34.0)
MCH: 28.1 pg (ref 26.0–34.0)
MCHC: 31.6 g/dL (ref 30.0–36.0)
MCHC: 29.8 g/dL — ABNORMAL LOW (ref 30.0–36.0)
MCV: 93 fL (ref 80.0–100.0)
MCV: 94.1 fL (ref 80.0–100.0)
Monocytes Absolute: 0.2 10*3/uL (ref 0.1–1.0)
Monocytes Absolute: 0.2 10*3/uL (ref 0.1–1.0)
Monocytes Relative: 6 %
Monocytes Relative: 9 %
Neutro Abs: 3.2 10*3/uL (ref 1.7–7.7)
Neutro Abs: 1.9 10*3/uL (ref 1.7–7.7)
Neutrophils Relative %: 93 %
Neutrophils Relative %: 86 %
Platelets: 79 10*3/uL — ABNORMAL LOW (ref 150–400)
Platelets: 72 10*3/uL — ABNORMAL LOW (ref 150–400)
RBC: 2.72 MIL/uL — ABNORMAL LOW (ref 4.22–5.81)
RBC: 2.53 MIL/uL — ABNORMAL LOW (ref 4.22–5.81)
RDW: 17.9 % — ABNORMAL HIGH (ref 11.5–15.5)
RDW: 17.7 % — ABNORMAL HIGH (ref 11.5–15.5)
WBC: 3.4 10*3/uL — ABNORMAL LOW (ref 4.0–10.5)
WBC: 2.2 10*3/uL — ABNORMAL LOW (ref 4.0–10.5)
nRBC: 0 % (ref 0.0–0.2)
nRBC: 0 % (ref 0.0–0.2)

## 2019-10-29 LAB — COMPREHENSIVE METABOLIC PANEL
ALT: 68 U/L — ABNORMAL HIGH (ref 0–44)
AST: 64 U/L — ABNORMAL HIGH (ref 15–41)
Albumin: 2.2 g/dL — ABNORMAL LOW (ref 3.5–5.0)
Alkaline Phosphatase: 223 U/L — ABNORMAL HIGH (ref 38–126)
Anion gap: 11 (ref 5–15)
BUN: 21 mg/dL — ABNORMAL HIGH (ref 6–20)
CO2: 23 mmol/L (ref 22–32)
Calcium: 8.5 mg/dL — ABNORMAL LOW (ref 8.9–10.3)
Chloride: 104 mmol/L (ref 98–111)
Creatinine, Ser: 0.73 mg/dL (ref 0.61–1.24)
GFR calc Af Amer: >60 mL/min (ref >60)
GFR calc non Af Amer: >60 mL/min (ref >60)
Glucose, Bld: 155 mg/dL — ABNORMAL HIGH (ref 70–99)
Potassium: 4.3 mmol/L (ref 3.5–5.1)
Sodium: 138 mmol/L (ref 135–145)
Total Bilirubin: 1.3 mg/dL — ABNORMAL HIGH (ref 0.3–1.2)
Total Protein: 5 g/dL — ABNORMAL LOW (ref 6.5–8.1)

## 2019-10-29 LAB — BLOOD GAS, VENOUS
Acid-base deficit: 1.4 mmol/L (ref 0.0–2.0)
Bicarbonate: 24.7 mmol/L (ref 20.0–28.0)
O2 Saturation: 76.7 %
Patient temperature: 98.6
pCO2, Ven: 48.9 mmHg (ref 44.0–60.0)
pH, Ven: 7.324 (ref 7.250–7.430)
pO2, Ven: 50.3 mmHg — ABNORMAL HIGH (ref 32.0–45.0)

## 2019-10-29 LAB — TYPE AND SCREEN
ABO/RH(D): O POS
Antibody Screen: NEGATIVE
Unit division: 0
Unit division: 0

## 2019-10-29 LAB — BLOOD GAS, ARTERIAL
Acid-Base Excess: 0.8 mmol/L (ref 0.0–2.0)
Acid-Base Excess: 1.6 mmol/L (ref 0.0–2.0)
Bicarbonate: 24.9 mmol/L (ref 20.0–28.0)
Bicarbonate: 26 mmol/L (ref 20.0–28.0)
FIO2: 40
O2 Saturation: 99 %
O2 Saturation: 87.4 %
Patient temperature: 98.6
Patient temperature: 98.6
pCO2 arterial: 39.4 mmHg (ref 32.0–48.0)
pCO2 arterial: 42.5 mmHg (ref 32.0–48.0)
pH, Arterial: 7.416 (ref 7.350–7.450)
pH, Arterial: 7.404 (ref 7.350–7.450)
pO2, Arterial: 141 mmHg — ABNORMAL HIGH (ref 83.0–108.0)
pO2, Arterial: 59.5 mmHg — ABNORMAL LOW (ref 83.0–108.0)

## 2019-10-29 LAB — CBG MONITORING, ED: Glucose-Capillary: 210 mg/dL — ABNORMAL HIGH (ref 70–99)

## 2019-10-29 LAB — LACTIC ACID, PLASMA
Lactic Acid, Venous: 1.6 mmol/L (ref 0.5–1.9)
Lactic Acid, Venous: 1.9 mmol/L (ref 0.5–1.9)

## 2019-10-29 LAB — RESPIRATORY PANEL BY RT PCR (FLU A&B, COVID)
Influenza A by PCR: NEGATIVE
Influenza B by PCR: NEGATIVE
SARS Coronavirus 2 by RT PCR: NEGATIVE

## 2019-10-29 LAB — PROTIME-INR
INR: 1.3 — ABNORMAL HIGH (ref 0.8–1.2)
Prothrombin Time: 15.9 seconds — ABNORMAL HIGH (ref 11.4–15.2)

## 2019-10-29 LAB — TROPONIN I (HIGH SENSITIVITY)
Troponin I (High Sensitivity): 12 ng/L (ref <18)
Troponin I (High Sensitivity): 30 ng/L — ABNORMAL HIGH (ref <18)

## 2019-10-29 LAB — BRAIN NATRIURETIC PEPTIDE: B Natriuretic Peptide: 52 pg/mL (ref 0.0–100.0)

## 2019-10-29 LAB — GLUCOSE, CAPILLARY
Glucose-Capillary: 174 mg/dL — ABNORMAL HIGH (ref 70–99)
Glucose-Capillary: 137 mg/dL — ABNORMAL HIGH (ref 70–99)

## 2019-10-29 MED ORDER — DIPHENHYDRAMINE HCL 50 MG/ML IJ SOLN
50.0000 mg | Freq: Once | INTRAMUSCULAR | Status: AC
  Administered 2019-10-29: 50 mg via INTRAVENOUS

## 2019-10-29 MED ORDER — SODIUM CHLORIDE 0.9% FLUSH
3.0000 mL | Freq: Two times a day (BID) | INTRAVENOUS | Status: DC
  Administered 2019-10-29 – 2019-11-12 (×24): 3 mL via INTRAVENOUS

## 2019-10-29 MED ORDER — SODIUM CHLORIDE 0.9 % IV SOLN
INTRAVENOUS | Status: DC
  Filled 2019-10-29 (×2): qty 250

## 2019-10-29 MED ORDER — SODIUM CHLORIDE 0.9% FLUSH: 3.0000 mL | INTRAVENOUS | Status: DC | PRN

## 2019-10-29 MED ORDER — ACETAMINOPHEN 500 MG PO TABS
1000.0000 mg | ORAL_TABLET | Freq: Once | ORAL | Status: AC
  Administered 2019-10-29: 1000 mg via ORAL
  Filled 2019-10-29: qty 2

## 2019-10-29 MED ORDER — MEPERIDINE HCL 25 MG/ML IJ SOLN
INTRAMUSCULAR | Status: AC
  Filled 2019-10-29: qty 1

## 2019-10-29 MED ORDER — INSULIN ASPART 100 UNIT/ML ~~LOC~~ SOLN
2.0000 [IU] | SUBCUTANEOUS | Status: DC
  Administered 2019-10-29: 21:00:00 4 [IU] via SUBCUTANEOUS
  Administered 2019-10-30 (×2): 2 [IU] via SUBCUTANEOUS
  Filled 2019-10-29: qty 0.06

## 2019-10-29 MED ORDER — PANTOPRAZOLE SODIUM 40 MG IV SOLR
40.0000 mg | Freq: Every day | INTRAVENOUS | Status: DC
  Administered 2019-10-29: 21:00:00 40 mg via INTRAVENOUS
  Filled 2019-10-29: qty 40

## 2019-10-29 MED ORDER — VANCOMYCIN HCL 1500 MG/300ML IV SOLN
1500.0000 mg | Freq: Once | INTRAVENOUS | Status: AC
  Administered 2019-10-29: 16:00:00 1500 mg via INTRAVENOUS
  Filled 2019-10-29: qty 300

## 2019-10-29 MED ORDER — METHYLPREDNISOLONE SODIUM SUCC 125 MG IJ SOLR
125.0000 mg | Freq: Once | INTRAMUSCULAR | Status: AC | PRN
  Administered 2019-10-29: 125 mg via INTRAVENOUS

## 2019-10-29 MED ORDER — MIDAZOLAM HCL (PF) 5 MG/ML IJ SOLN: 1.0000 mg | INTRAMUSCULAR | Status: DC | PRN

## 2019-10-29 MED ORDER — ORAL CARE MOUTH RINSE
15.0000 mL | Freq: Two times a day (BID) | OROMUCOSAL | Status: DC
  Administered 2019-10-30 – 2019-11-09 (×7): 15 mL via OROMUCOSAL

## 2019-10-29 MED ORDER — MEPERIDINE HCL 25 MG/ML IJ SOLN
25.0000 mg | Freq: Once | INTRAMUSCULAR | Status: AC
  Administered 2019-10-29: 25 mg via INTRAVENOUS

## 2019-10-29 MED ORDER — ALBUTEROL SULFATE (2.5 MG/3ML) 0.083% IN NEBU
2.5000 mg | INHALATION_SOLUTION | Freq: Once | RESPIRATORY_TRACT | Status: DC | PRN
  Filled 2019-10-29: qty 3

## 2019-10-29 MED ORDER — SODIUM CHLORIDE 0.9 % IV SOLN
250.0000 mL | INTRAVENOUS | Status: DC | PRN
  Administered 2019-10-29 – 2019-10-31 (×2): 250 mL via INTRAVENOUS

## 2019-10-29 MED ORDER — SODIUM CHLORIDE 0.9 % IV SOLN: Freq: Once | INTRAVENOUS | Status: DC

## 2019-10-29 MED ORDER — IPRATROPIUM-ALBUTEROL 0.5-2.5 (3) MG/3ML IN SOLN
3.0000 mL | RESPIRATORY_TRACT | Status: DC
  Administered 2019-10-29 – 2019-10-30 (×3): 3 mL via RESPIRATORY_TRACT
  Filled 2019-10-29 (×3): qty 3

## 2019-10-29 MED ORDER — SODIUM CHLORIDE 0.9 % IV SOLN
2.0000 g | Freq: Once | INTRAVENOUS | Status: AC
  Administered 2019-10-29: 15:00:00 2 g via INTRAVENOUS
  Filled 2019-10-29: qty 2

## 2019-10-29 MED ORDER — SODIUM CHLORIDE 0.9 % IV SOLN
Freq: Once | INTRAVENOUS | Status: AC
  Filled 2019-10-29: qty 250

## 2019-10-29 MED ORDER — CHLORHEXIDINE GLUCONATE 0.12 % MT SOLN
15.0000 mL | Freq: Two times a day (BID) | OROMUCOSAL | Status: DC
  Administered 2019-10-30 – 2019-11-12 (×18): 15 mL via OROMUCOSAL
  Filled 2019-10-29 (×21): qty 15

## 2019-10-29 MED ORDER — SODIUM CHLORIDE 0.9 % IV SOLN
1000.0000 mg | Freq: Once | INTRAVENOUS | Status: AC
  Administered 2019-10-29: 1000 mg via INTRAVENOUS
  Filled 2019-10-29: qty 40

## 2019-10-29 MED ORDER — ALBUTEROL SULFATE HFA 108 (90 BASE) MCG/ACT IN AERS
4.0000 | INHALATION_SPRAY | Freq: Once | RESPIRATORY_TRACT | Status: AC
  Administered 2019-10-29: 14:00:00 4 via RESPIRATORY_TRACT
  Filled 2019-10-29: qty 6.7

## 2019-10-29 MED ORDER — HEPARIN SOD (PORK) LOCK FLUSH 100 UNIT/ML IV SOLN
500.0000 [IU] | Freq: Once | INTRAVENOUS | Status: DC | PRN
  Filled 2019-10-29: qty 5

## 2019-10-29 MED ORDER — ACETAMINOPHEN 325 MG PO TABS
ORAL_TABLET | ORAL | Status: AC
  Filled 2019-10-29: qty 2

## 2019-10-29 MED ORDER — FUROSEMIDE 10 MG/ML IJ SOLN
20.0000 mg | Freq: Once | INTRAMUSCULAR | Status: AC
  Administered 2019-10-29: 19:00:00 20 mg via INTRAVENOUS
  Filled 2019-10-29: qty 4

## 2019-10-29 MED ORDER — SODIUM CHLORIDE 0.9 % IV SOLN
20.0000 mg | Freq: Once | INTRAVENOUS | Status: AC
  Administered 2019-10-29: 20 mg via INTRAVENOUS
  Filled 2019-10-29: qty 20

## 2019-10-29 MED ORDER — SODIUM CHLORIDE 0.9 % IV SOLN: 2.0000 g | Freq: Three times a day (TID) | INTRAVENOUS | Status: DC

## 2019-10-29 MED ORDER — SODIUM CHLORIDE 0.9% FLUSH
10.0000 mL | INTRAVENOUS | Status: DC | PRN
  Filled 2019-10-29: qty 10

## 2019-10-29 MED ORDER — VANCOMYCIN HCL IN DEXTROSE 1-5 GM/200ML-% IV SOLN
1000.0000 mg | Freq: Two times a day (BID) | INTRAVENOUS | Status: DC
  Administered 2019-10-30: 07:00:00 1000 mg via INTRAVENOUS
  Filled 2019-10-29 (×2): qty 200

## 2019-10-29 MED ORDER — CHLORHEXIDINE GLUCONATE CLOTH 2 % EX PADS
6.0000 | MEDICATED_PAD | Freq: Every day | CUTANEOUS | Status: DC
  Administered 2019-10-29 – 2019-11-09 (×7): 6 via TOPICAL

## 2019-10-29 MED ORDER — METHYLPREDNISOLONE SODIUM SUCC 125 MG IJ SOLR
80.0000 mg | INTRAMUSCULAR | Status: DC
  Administered 2019-10-29 – 2019-10-30 (×4): 80 mg via INTRAVENOUS
  Filled 2019-10-29 (×4): qty 2

## 2019-10-29 MED ORDER — DIPHENHYDRAMINE HCL 50 MG/ML IJ SOLN
INTRAMUSCULAR | Status: AC
  Filled 2019-10-29: qty 1

## 2019-10-29 MED ORDER — EPINEPHRINE 0.3 MG/0.3ML IJ SOAJ
0.3000 mg | Freq: Once | INTRAMUSCULAR | Status: DC | PRN
  Administered 2019-10-29: 0.3 mg via INTRAMUSCULAR
  Filled 2019-10-29: qty 0.3

## 2019-10-29 MED ORDER — ACETAMINOPHEN 325 MG PO TABS
650.0000 mg | ORAL_TABLET | Freq: Once | ORAL | Status: AC
  Administered 2019-10-29: 650 mg via ORAL

## 2019-10-29 MED ORDER — SODIUM CHLORIDE 0.9 % IV SOLN
2.0000 g | Freq: Three times a day (TID) | INTRAVENOUS | Status: AC
  Administered 2019-10-29 – 2019-11-02 (×13): 2 g via INTRAVENOUS
  Filled 2019-10-29 (×13): qty 2

## 2019-10-29 MED ORDER — LORAZEPAM 2 MG/ML IJ SOLN
0.5000 mg | Freq: Once | INTRAMUSCULAR | Status: AC
  Administered 2019-10-29: 0.5 mg via INTRAVENOUS
  Filled 2019-10-29: qty 1

## 2019-10-29 MED ORDER — MIDAZOLAM HCL 2 MG/2ML IJ SOLN
1.0000 mg | INTRAMUSCULAR | Status: DC | PRN
  Administered 2019-10-30: 04:00:00 1 mg via INTRAVENOUS
  Filled 2019-10-29: qty 2

## 2019-10-29 MED ORDER — MAGNESIUM SULFATE IN D5W 1-5 GM/100ML-% IV SOLN
1.0000 g | Freq: Once | INTRAVENOUS | Status: AC
  Administered 2019-10-29: 1 g via INTRAVENOUS
  Filled 2019-10-29: qty 100

## 2019-10-29 NOTE — Progress Notes (Signed)
Okay to treat today with elevated heart rate today per [facility]. Patient will also receive a liter of fluids today w/ treatment.   Demetrius Charity, PharmD, [place] Oncology Pharmacist Pharmacy Phone: [phone] 10/29/2019

## 2019-10-29 NOTE — ED Triage Notes (Addendum)
Pt was receiving cancer treatment over at the cancer center when he became very [facility] after "bumping up" the dose. Cancer center nurse stated that pt seemed a little off before starting the treatment but SpO2 was 94% and pt was eating and drinking without difficulty. Pt on nonrebreather.

## 2019-10-29 NOTE — Progress Notes (Signed)
[facility] Progress Note   Isaac Pierce [id number] [birth year] 59 y.o.  Isaac Pierce is managed by Dr. Fanny Bien. Isaac Pierce  Actively treated with chemotherapy/immunotherapy/hormonal therapy: yes  Current therapy: obinutuzumab (GAZYVA) IV and oral prednisone. Awaiting start of Acalabrutinib  Last treated: 10/22/2019 (cycle #1)  Next scheduled appointment with provider: 11/18/2019  Assessment: Plan:    No diagnosis found.   Symptomatic anemia: A CBC returned with a hemoglobin of 7.1 yesterday. The patient was given 1 unit of PRBC's.   Shortness of breath with rigors: Isaac Pierce was seen today as he was receiving cycle 2 of obinutuzumab (GAZYVA) in infusion.  He had been premedicated with Tylenol, Benadryl, and dexamethasone.  The patient was given subcu epinephrine, Solu-Medrol 125 mg IV and Demerol 25 mg IV x1 given his physical exam.  He was also started on supplemental oxygen.  Despite these efforts he did not respond to these interventions and remained significantly short of breath was in a moderate amount of stress.  Based on this the rapid response was called and the patient was transported to the emergency room for evaluation and management.  Dr. Julien Pierce was contacted and made aware of my management of this patient.  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 Earlie Pierce and was receiving cycle 2 of obinutuzumab (GAZYVA) IV today when he was noted to have progressive dyspnea.  Please see After Visit Summary for patient specific instructions.  Future Appointments  Date Time Provider [place]  11/04/2019 11:00 AM [facility] LAB/FLUSH [facility] None  11/04/2019 11:15 AM [facility] FLUSH [facility] None  11/04/2019 12:00 PM [facility] INFUSION [facility] None  11/11/2019  2:00 PM [facility] LAB 2 [facility] None  11/11/2019  2:15 PM [facility] [place] FLUSH [facility] None  11/18/2019  8:00 AM [facility] LAB 2 [facility] None  11/18/2019  8:15 AM [facility]  [place] FLUSH [facility] None  11/18/2019  9:00 AM Heilingoetter, Cassandra L, PA-C [facility] None  11/18/2019 10:15 AM [facility] INFUSION [facility] None    No orders of the defined types were placed in this encounter.      Subjective:   Patient ID:  Isaac Pierce is a 59 y.o. (DOB 11-Jul-1961) male.  Chief Complaint:  No chief complaint on file.   HPI Isaac Pierce Is a 59 y.o. male with a diagnosis of a small B-cell lymphoma. He continues to be managed by Dr. Julien Pierce.  He was seen yesterday in the [facility] for overall weakness and dyspnea on exertion.  At that visit he reported that he had been having increasing fatigue, shortness of breath, and dyspnea on exertion along with overall weakness.  He also reported that he had been without Lasix for several weeks and had progressive bilateral lower extremity edema and shortness of breath.  He was not able to get this prescription until Wednesday evening due to no resources available. He restarted Lasix on Wednesday evening.  He continued to have bilateral lower extremity edema.  At that visit a CBC returned with a hemoglobin of 7.1.  He was transfused with 1 unit of packed red blood cells.  He was seen today as he was receiving his second cycle of obinutuzumab (GAZYVA) IV.  He has had baseline shortness of breath which increased in severity while he was receiving his chemotherapy today.  Additionally he developed rigors.  His vital signs returned with a blood pressure of 143/85, pulse of 140, respirations of 24 and oxygen saturation at 93% on room air.  His  temperature was 98.9. He had been premedicated with Tylenol, Benadryl, and dexamethasone.  He appeared to be in respiratory distress.  He was given subcu epinephrine, Solu-Medrol 125 mg IV and Demerol 25 mg IV x1 given his physical exam.  He was also started on supplemental oxygen.  Despite these efforts he did not respond to these interventions and remained significantly short of  breath was in a moderate amount of stress.  Based on this the rapid response was called and the patient was transported to the emergency room for evaluation and management.    Medications: I have reviewed the patient's current medications.  Allergies:  Allergies  Allergen Reactions  . Rituximab Rash    Had rash, anxiety, HTN & tachycardia ~57min into 1st infusion 08/27/19    Past Medical History:  Diagnosis Date  . Anxiety   . Back pain   . GERD (gastroesophageal reflux disease)     Past Surgical History:  Procedure Laterality Date  . IR IMAGING GUIDED PORT INSERTION  08/30/2019    Family History  Problem Relation Age of Onset  . Lymphoma Mother   . Lymphoma Father     Social History   Socioeconomic History  . Marital status: Single    Spouse name: Not on file  . Number of children: Not on file  . Years of education: Not on file  . Highest education level: Not on file  Occupational History  . Not on file  Tobacco Use  . Smoking status: Current Every Day Smoker    Types: Cigarettes  . Smokeless tobacco: Never Used  Substance and Sexual Activity  . Alcohol use: Not Currently  . Drug use: Not Currently  . Sexual activity: Not on file  Other Topics Concern  . Not on file  Social History Narrative  . Not on file   Social Determinants of Health   Financial Resource Strain:   . Difficulty of Paying Living Expenses: Not on file  Food Insecurity:   . Worried About Charity fundraiser in the Last Year: Not on file  . Ran Out of Food in the Last Year: Not on file  Transportation Needs:   . Lack of Transportation (Medical): Not on file  . Lack of Transportation (Non-Medical): Not on file  Physical Activity:   . Days of Exercise per Week: Not on file  . Minutes of Exercise per Session: Not on file  Stress:   . Feeling of Stress : Not on file  Social Connections:   . Frequency of Communication with Friends and Family: Not on file  . Frequency of Social  Gatherings with Friends and Family: Not on file  . Attends Religious Services: Not on file  . Active Member of Clubs or Organizations: Not on file  . Attends Archivist Meetings: Not on file  . Marital Status: Not on file  Intimate Partner Violence:   . Fear of Current or Ex-Partner: Not on file  . Emotionally Abused: Not on file  . Physically Abused: Not on file  . Sexually Abused: Not on file    Past Medical History, Surgical history, Social history, and Family history were reviewed and updated as appropriate.   Please see review of systems for further details on the patient's review from today.   Review of Systems:  Review of Systems  Respiratory: Positive for shortness of breath and wheezing.   Cardiovascular: Positive for palpitations and leg swelling. Negative for chest pain.    Objective:  Physical Exam:  There were no vitals taken for this visit. ECOG: 1  blood pressure of 143/85, pulse of 140, respirations of 24 and oxygen saturation at 93% on room air  Physical Exam Constitutional:      General: He is in acute distress.     Appearance: He is not diaphoretic.  HENT:     Head: Normocephalic and atraumatic.  Cardiovascular:     Rate and Rhythm: Regular rhythm. Tachycardia present.  Pulmonary:     Effort: Tachypnea, accessory muscle usage and respiratory distress present.     Breath sounds: Examination of the right-upper field reveals decreased breath sounds. Examination of the left-upper field reveals decreased breath sounds. Examination of the right-middle field reveals decreased breath sounds. Examination of the left-middle field reveals decreased breath sounds. Examination of the right-lower field reveals decreased breath sounds. Examination of the left-lower field reveals decreased breath sounds. Decreased breath sounds and wheezing present.  Chest:     Chest wall: No tenderness.  Musculoskeletal:     Right lower leg: Edema present.     Left lower  leg: Edema present.     Comments: 2+ bilateral lower extremity edema  Neurological:     Gait: Gait abnormal (The patient has been ambulating with a wheelchair.).     Lab Review:     Component Value Date/Time   NA 138 10/29/2019 1404   K 4.3 10/29/2019 1404   CL 104 10/29/2019 1404   CO2 23 10/29/2019 1404   GLUCOSE 155 (H) 10/29/2019 1404   BUN 21 (H) 10/29/2019 1404   CREATININE 0.73 10/29/2019 1404   CREATININE 0.61 10/28/2019 1233   CALCIUM 8.5 (L) 10/29/2019 1404   PROT 5.0 (L) 10/29/2019 1404   ALBUMIN 2.2 (L) 10/29/2019 1404   AST 64 (H) 10/29/2019 1404   AST 44 (H) 10/28/2019 1233   ALT 68 (H) 10/29/2019 1404   ALT 57 (H) 10/28/2019 1233   ALKPHOS 223 (H) 10/29/2019 1404   BILITOT 1.3 (H) 10/29/2019 1404   BILITOT 0.6 10/28/2019 1233   GFRNONAA >60 10/29/2019 1404   GFRNONAA >60 10/28/2019 1233   GFRAA >60 10/29/2019 1404   GFRAA >60 10/28/2019 1233       Component Value Date/Time   WBC 3.4 (L) 10/29/2019 1404   RBC 2.72 (L) 10/29/2019 1404   HGB 8.0 (L) 10/29/2019 1404   HGB 7.1 (L) 10/28/2019 1233   HCT 25.3 (L) 10/29/2019 1404   PLT 79 (L) 10/29/2019 1404   PLT 87 (L) 10/28/2019 1233   MCV 93.0 10/29/2019 1404   MCH 29.4 10/29/2019 1404   MCHC 31.6 10/29/2019 1404   RDW 17.9 (H) 10/29/2019 1404   LYMPHSABS 0.0 (L) 10/29/2019 1404   MONOABS 0.2 10/29/2019 1404   EOSABS 0.0 10/29/2019 1404   BASOSABS 0.0 10/29/2019 1404   -------------------------------  Imaging from last 24 hours (if applicable):  Radiology interpretation: DG Chest Port 1 View  Result Date: 10/29/2019 CLINICAL DATA:  Shortness of breath EXAM: PORTABLE CHEST 1 VIEW COMPARISON:  09/01/2019, CT 08/25/2019 FINDINGS: Right-sided central venous port tip over the cavoatrial region allowing for rotated patient. Suspected moderate layering right pleural effusion, probably increased from prior radiograph. Moderate left pleural effusion, also suspect increase in size. Enlarged  cardiomediastinal silhouette with vascular congestion. Consolidation at the left lung base. Fluid in the right fissure. Hazy appearance of both thoraces likely due to layering fluid although underlying pulmonary edema could be contributing. No pneumothorax. IMPRESSION: Hazy appearance of the bilateral lungs since  09/01/2019, suspected to be secondary to moderate layering pleural effusions, probably increased in the interim. Cardiomegaly with vascular congestion; hazy lung appearance could also be due to associated edema. Atelectasis or pneumonia at the left base. Electronically Signed   By: Donavan Foil M.D.   On: 10/29/2019 15:19        This patient was discussed with Dr. Julien Pierce with my treatment plan reviewed with him. He expressed agreement with my medical management of this patient.  Harle Stanford, PA-C 10/29/19

## 2019-10-29 NOTE — ED Provider Notes (Signed)
[facility] DEPT Provider Note   CSN: [id number] Arrival date & time: 10/29/19  1330     History Chief Complaint  Patient presents with  . Shortness of Breath    Isaac Pierce is a 59 y.o. male.  59 year old male with prior medical history as detailed below presents for evaluation of acute onset shortness of breath.  Patient was at in the infusion center when he developed acute shortness of breath during treatment.  Patient was noted to be uncomfortable.  He has a history of prior allergic reactions to chemotherapy infusions.  Patient was given under 125 mg of Solu-Medrol and epinephrine in the infusion center prior to transfer to the ED.  On arrival to the ED the patient is visibly tachypneic and reporting shortness of breath.  Patient reports that his symptoms began acutely during infusion.  He feels improved in the short amount of time between leaving the infusion center and arrived in the ED.  Patient's case discussed with the patient's sister who has medical power of attorney.  She confirms that the patient is full code.  The history is provided by the patient and medical records.  Shortness of Breath Severity:  Mild Onset quality:  Sudden Timing:  Constant Progression:  Partially resolved Chronicity:  New Relieved by:  Nothing Worsened by:  Nothing      Past Medical History:  Diagnosis Date  . Anxiety   . Back pain   . GERD (gastroesophageal reflux disease)     Patient Active Problem List   Diagnosis Date Noted  . Port-A-Cath in place 10/28/2019  . Chemotherapy induced neutropenia ([place]) 09/08/2019  . Goals of care, counseling/discussion 09/08/2019  . Encounter for antineoplastic chemotherapy 09/08/2019  . 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 Anemia   . Shock ([place])   . Pressure injury of skin 08/26/2019  . Non-Hodgkin lymphoma ([place]) 08/25/2019  . Protein-calorie malnutrition, severe 08/19/2019  . Malignancy ([place])  08/17/2019  . Severe sepsis ([place]) 08/17/2019  . Pancytopenia ([place]) 08/17/2019  . Dyspnea 08/17/2019  . Pleural effusion 08/17/2019  . Hyponatremia 08/17/2019    Past Surgical History:  Procedure Laterality Date  . IR IMAGING GUIDED PORT INSERTION  08/30/2019       Family History  Problem Relation Age of Onset  . Lymphoma Mother   . Lymphoma Father     Social History   Tobacco Use  . Smoking status: Current Every Day Smoker    Types: Cigarettes  . Smokeless tobacco: Never Used  Substance Use Topics  . Alcohol use: Not Currently  . Drug use: Not Currently    Home Medications Prior to Admission medications   Medication Sig Start Date End Date Taking? Authorizing Provider  acalabrutinib (CALQUENCE) 100 MG capsule Take 1 capsule (100 mg total) by mouth 2 (two) times daily. 10/15/19   Curt Bears, MD  albuterol (VENTOLIN HFA) 108 (90 Base) MCG/ACT inhaler Inhale 2 puffs into the lungs every 6 (six) hours as needed for wheezing or shortness of breath. 09/06/19 10/06/19  Shelly Coss, MD  allopurinol (ZYLOPRIM) 300 MG tablet Take 1 tablet (300 mg total) by mouth daily. 10/05/19   Curt Bears, MD  folic acid (FOLVITE) 1 MG tablet Take 1 tablet by mouth once daily 10/14/19   Curt Bears, MD  furosemide (LASIX) 20 MG tablet Take 1 tablet (20 mg total) by mouth daily. 10/18/19   Curt Bears, MD  HYDROcodone-acetaminophen [facility]) 5-325 MG tablet 1/2 to 1 tab  PO q 6 hours prn pain 10/15/19   Heilingoetter, Cassandra L, PA-C  hydrocortisone (ANUSOL-HC) 2.5 % rectal cream Place rectally 3 (three) times daily. 09/06/19   Shelly Coss, MD  hydrOXYzine (ATARAX/VISTARIL) 10 MG tablet Take 1 tablet (10 mg total) by mouth every 8 (eight) hours as needed. 10/05/19   Curt Bears, MD  lidocaine-prilocaine (EMLA) cream Apply 1 application topically as needed. 09/16/19   Heilingoetter, Cassandra L, PA-C  lidocaine-prilocaine (EMLA) cream Apply 1 application topically as needed.  10/28/19   Tanner, Lyndon Code., PA-C  nicotine (NICODERM CQ - DOSED IN MG/24 HR) 7 mg/24hr patch Place 1 patch (7 mg total) onto the skin daily. Patient not taking: Reported on 10/15/2019 09/07/19   Shelly Coss, MD  omeprazole (PRILOSEC) 20 MG capsule Take 1 capsule (20 mg total) by mouth daily. 10/05/19   Curt Bears, MD  predniSONE (DELTASONE) 20 MG tablet Please take 3 tablets (60 mg) daily for 7 days for 1 week, followed by 2 tablets (40 mg) daily for 7 days, followed by 1 tablet (20 mg) daily for 7 days, followed by 1/2 a tablet daily for 7 days. 10/13/19   Heilingoetter, Cassandra L, PA-C  prochlorperazine (COMPAZINE) 10 MG tablet Take 1 tablet (10 mg total) by mouth every 6 (six) hours as needed for nausea or vomiting. Patient not taking: Reported on 10/15/2019 09/16/19   Heilingoetter, Cassandra L, PA-C  traMADol (ULTRAM) 50 MG tablet Take 1 tablet (50 mg total) by mouth every 6 (six) hours as needed for moderate pain. 09/06/19   Shelly Coss, MD    Allergies    Rituximab  Review of Systems   Review of Systems  Respiratory: Positive for shortness of breath.   All other systems reviewed and are negative.   Physical Exam Updated Vital Signs BP 109/70   Pulse (!) 129   Temp (!) 103.2 F (39.6 C) (Tympanic)   Resp 20   Ht 5\' 11"  (1.803 m)   Wt 72.4 kg   SpO2 99%   BMI 22.26 kg/m   Physical Exam Vitals and nursing note reviewed.  Constitutional:      General: He is in acute distress.  HENT:     Head: Normocephalic and atraumatic.  Eyes:     Conjunctiva/sclera: Conjunctivae normal.     Pupils: Pupils are equal, round, and reactive to light.  Cardiovascular:     Rate and Rhythm: Normal rate and regular rhythm.     Heart sounds: Normal heart sounds.  Pulmonary:     Effort: Tachypnea, accessory muscle usage and respiratory distress present.     Comments: Diffuse expiratory wheezes in all lung fields Abdominal:     General: There is no distension.     Palpations: Abdomen  is soft.     Tenderness: There is no abdominal tenderness.  Musculoskeletal:        General: No deformity. Normal range of motion.     Cervical back: Normal range of motion and neck supple.  Skin:    General: Skin is warm and dry.  Neurological:     Mental Status: He is alert and oriented to person, place, and time.     ED Results / Procedures / Treatments   Labs (all labs ordered are listed, but only abnormal results are displayed) Labs Reviewed  BLOOD GAS, VENOUS - Abnormal; Notable for the following components:      Result Value   pO2, Ven 50.3 (*)    All other components within normal limits  COMPREHENSIVE METABOLIC PANEL - Abnormal; Notable for the following components:   Glucose, Bld 155 (*)    BUN 21 (*)    Calcium 8.5 (*)    Total Protein 5.0 (*)    Albumin 2.2 (*)    AST 64 (*)    ALT 68 (*)    Alkaline Phosphatase 223 (*)    Total Bilirubin 1.3 (*)    All other components within normal limits  CBC WITH DIFFERENTIAL/PLATELET - Abnormal; Notable for the following components:   WBC 3.4 (*)    RBC 2.72 (*)    Hemoglobin 8.0 (*)    HCT 25.3 (*)    RDW 17.9 (*)    Platelets 79 (*)    Lymphs Abs 0.0 (*)    All other components within normal limits  PROTIME-INR - Abnormal; Notable for the following components:   Prothrombin Time 15.9 (*)    INR 1.3 (*)    All other components within normal limits  RESPIRATORY PANEL BY RT PCR (FLU A&B, COVID)  CULTURE, BLOOD (ROUTINE X 2)  CULTURE, BLOOD (ROUTINE X 2)  LACTIC ACID, PLASMA  BRAIN NATRIURETIC PEPTIDE  LACTIC ACID, PLASMA  BLOOD GAS, ARTERIAL  POC SARS CORONAVIRUS 2 AG -  ED  TYPE AND SCREEN  TROPONIN I (HIGH SENSITIVITY)  TROPONIN I (HIGH SENSITIVITY)    EKG EKG Interpretation  Date/Time:  Friday October 29 2019 13:45:57 EST Ventricular Rate:  148 PR Interval:    QRS Duration: 78 QT Interval:  248 QTC Calculation: 389 R Axis:   88 Text Interpretation: Sinus tachycardia Probable anterolateral infarct,  old Confirmed by Dene Gentry [phone]) on 10/29/2019 2:03:44 PM   Radiology DG Chest Port 1 View  Result Date: 10/29/2019 CLINICAL DATA:  Shortness of breath EXAM: PORTABLE CHEST 1 VIEW COMPARISON:  09/01/2019, CT 08/25/2019 FINDINGS: Right-sided central venous port tip over the cavoatrial region allowing for rotated patient. Suspected moderate layering right pleural effusion, probably increased from prior radiograph. Moderate left pleural effusion, also suspect increase in size. Enlarged cardiomediastinal silhouette with vascular congestion. Consolidation at the left lung base. Fluid in the right fissure. Hazy appearance of both thoraces likely due to layering fluid although underlying pulmonary edema could be contributing. No pneumothorax. IMPRESSION: Hazy appearance of the bilateral lungs since 09/01/2019, suspected to be secondary to moderate layering pleural effusions, probably increased in the interim. Cardiomegaly with vascular congestion; hazy lung appearance could also be due to associated edema. Atelectasis or pneumonia at the left base. Electronically Signed   By: Donavan Foil M.D.   On: 10/29/2019 15:19    Procedures Procedures (including critical care time) CRITICAL CARE Performed by: Valarie Merino   Total critical care time: 45 minutes  Critical care time was exclusive of separately billable procedures and treating other patients.  Critical care was necessary to treat or prevent imminent or life-threatening deterioration.  Critical care was time spent personally by me on the following activities: development of treatment plan with patient and/or surrogate as well as nursing, discussions with consultants, evaluation of patient's response to treatment, examination of patient, obtaining history from patient or surrogate, ordering and performing treatments and interventions, ordering and review of laboratory studies, ordering and review of radiographic studies, pulse oximetry and  re-evaluation of patient's condition.   Medications Ordered in ED Medications  vancomycin (VANCOREADY) IVPB 1500 mg/300 mL (1,500 mg Intravenous New Bag/Given 10/29/19 1542)  magnesium sulfate IVPB 1 g 100 mL (0 g Intravenous Stopped 10/29/19 1542)  albuterol (VENTOLIN HFA)  108 (90 Base) MCG/ACT inhaler 4 puff (4 puffs Inhalation Given 10/29/19 1407)  LORazepam (ATIVAN) injection 0.5 mg (0.5 mg Intravenous Given 10/29/19 1408)  acetaminophen (TYLENOL) tablet 1,000 mg (1,000 mg Oral Given 10/29/19 1411)  ceFEPIme (MAXIPIME) 2 g in sodium chloride 0.9 % 100 mL IVPB (2 g Intravenous New Bag/Given 10/29/19 1450)    ED Course  I have reviewed the triage vital signs and the nursing notes.  Pertinent labs & imaging results that were available during my care of the patient were reviewed by me and considered in my medical decision making (see chart for details).    MDM Rules/Calculators/A&P                      MDM  Screen complete  Siddhartha Dorsey was evaluated in Emergency Department on 10/29/2019 for the symptoms described in the history of present illness. He was evaluated in the context of the global COVID-19 pandemic, which necessitated consideration that the patient might be at risk for infection with the SARS-CoV-2 virus that causes COVID-19. Institutional protocols and algorithms that pertain to the evaluation of patients at risk for COVID-19 are in a state of rapid change based on information released by regulatory bodies including the CDC and federal and state organizations. These policies and algorithms were followed during the patient's care in the ED.  Patient is presenting with acute onset shortness of breath and respiratory distress during infusion of monoclonal antibody.  Patient's presentation is consistent with possible allergic type reaction.  However, patient does have fever on arrival.  Supportive treatment initiated.  Patient placed on BiPAP after Covid test were negative.   Broad-spectrum antibiotics given.  Patient will require admission.  Case discussed with critical care services is evaluating the patient and will likely admit.     Final Clinical Impression(s) / ED Diagnoses Final diagnoses:  Respiratory distress    Rx / DC Orders ED Discharge Orders    None       Valarie Merino, MD 10/29/19 [phone]

## 2019-10-29 NOTE — Progress Notes (Signed)
A consult was received from an ED physician for cefepime & vancomycin per pharmacy dosing.  The patient's profile has been reviewed for ht/wt/allergies/indication/available labs.   A one time order has been placed for cefepime 2 gm and vancomycin 1500 mg.    Further antibiotics/pharmacy consults should be ordered by admitting physician if indicated.       Thank you              Eudelia Bunch, Pharm.D 10/29/2019 2:10 PM

## 2019-10-29 NOTE — Progress Notes (Signed)
Pt transported from ED to CT scan and then to ICU room 1234.  Pt remained stable throughout trip on BiPAP.

## 2019-10-29 NOTE — Progress Notes (Signed)
Lab notified of ABG being sent 

## 2019-10-29 NOTE — Progress Notes (Signed)
Patient with respiratory distress, stridor, given supplemental O2 of 4 liters, Sandi Mealy called to chair side.  Gayzva stopped and and NS infusion open clamp,  1300 EPIPEN delivered to left thigh, Solumedrol 125 given at 1300 and Demerol 25 IVP at 1305 for rigors, unable to obtain temp due to rigors.  With no improvement Rapid Response team called.  O2 sat down to 90% and non rebreather started at 1315.  Escorted patient to ED with rapid response tream report given.

## 2019-10-29 NOTE — Progress Notes (Signed)
Pharmacy Antibiotic Note  Isaac Pierce is a 59 y.o. male admitted on 10/29/2019 with pneumonia.  Pharmacy has been consulted for vancomycin + cefepime dosing.  Today, 10/29/19 -WBC 3.4 -SCr 0.7, CrCl > 60 mL/min -Lactate 1.9  Plan:  Cefepime 2 g IV q8h  Vancomycin 1500 mg LD followed by 1000 mg IV q12h  Goal vancomycin AUC 400-550  Follow renal function and culture data  Ordered MRSA PCR  Height: 5\' 11"  (180.3 cm) Weight: 159 lb 9.8 oz (72.4 kg) IBW/kg (Calculated) : 75.3  Temp (24hrs), Avg:99.1 F (37.3 C), Min:98.1 F (36.7 C), Max:103.2 F (39.6 C)  Recent Labs  Lab 10/28/19 1233 10/29/19 1404 10/29/19 1715  WBC 2.8* 3.4*  --   CREATININE 0.61 0.73  --   LATICACIDVEN  --  1.6 1.9    Estimated Creatinine Clearance: 105.6 mL/min (by C-G formula based on SCr of 0.73 mg/dL).    Allergies  Allergen Reactions  . Rituximab Rash    Had rash, anxiety, HTN & tachycardia ~12min into 1st infusion 08/27/19    Antimicrobials this admission: cefepime 1/29 >>  vancomycin 1/29 >>   Dose adjustments this admission:  Microbiology results: 1/29 BCx: Sent 1/29 Urine:   Thank you for allowing pharmacy to be a part of this patient's care.  Lenis Noon, PharmD 10/29/2019 6:57 PM

## 2019-10-29 NOTE — Progress Notes (Signed)
Called for Rapid Response in [place]. When arrived PT on non rebreather 15 lpm with Sp02 in low 80's. Prior to leaving cancer center sp02 was mid 32's. Assisted with transport to [facility] ED 9 - upon arrival Sp02 100%. RT removed flap from non rebreather at 15 lpm - informed RN that PT now on partial rebreather and mask flap is tapped to room monitor frame if needed. MD states PT will need BiPAP- RT stated we need a COVID test (last tested 08-2019 but is symptomatic at this time). MD agreed and RN will notify RT when test result is back. RT spoke with Charge RN in [facility] ICU- PT can be moved to [place] once cleaned if we need BiPAP in negative pressure room (as [facility] ED 9 is not negative pressure).

## 2019-10-29 NOTE — H&P (Addendum)
NAME:  Isaac Pierce, MRN:  [id number], DOB:  1961-06-14, LOS: 0 ADMISSION DATE:  10/29/2019, CONSULTATION DATE:  10/29/2019  REFERRING MD:  Dr Francia Greaves of [facility], CHIEF COMPLAINT:  Acute resp failure following Mab infussion   Brief History   Acute resp failure folowing Mab Gazyva infusion.  Patient of Dr. Curt Bears with b cell lymphom and buly adenonpathy diffuse  History of present illness   59 year old male with bulky CLL/small B-cell lymphoma and bulky lymphadenopathy that is extensive.  Oncology history detailed below.  Given failure to CHOP chemotherapy monoclonal antibody against CD20 was started per protocol with appropriate premedications.  Cycle 1 given on October 22, 2019.  Then yesterday he did receive blood transfusion for hemoglobin of 7.7 g%.  He presented today for cycle #2 and received appropriate premedication of acetaminophen, Benadryl and Decadron.  However shortly after start of the infusion he developed significant stridor diaphoresis tachycardia and respiratory distress and taken emergently to the emergency department.  At the infusion center he was given steroids and epinephrine.  He was in severe respiratory distress.  He was given some Ativan and started on BiPAP.  He also developed fever rapidly.  After the initiation of this treatment according to the EDP he started improving.  Pulmonary critical care has now been called to admit the patient.Of note he had not been taking his Lasix and he developed some fluid overload with lymphedema prior to the infusion.  Noted that August 22, 2019 had a left thoracentesis with malignant cells.  Current chest x-ray shows worsening of the left-sided pleural effusion.  Oncology Hx  -  Small B-cell lymphoma:stage IV CLL/small lymphocytic lymphoma presented with bulky lymphadenopathy in the chest, abdomen and pelvis as well as hepatosplenomegaly and bone marrow involvement.  The patient was initially treated with 2 cycles of CHOP but has  no significant improvement in his condition  -  Current therapy: obinutuzumab (GAZYVA) IV and oral prednisone. Awaiting start of Acalabrutinib but insurance delays. +. Last treated: 10/22/2019 (cycle #1, WBC 2.1, hgb 7.7 and plat 48 on 10/21/2019) and then cycle #2 10/29/2019 . Greater than 10% side effects for this monoclonal antibody includes rash, electrolyte imbalances, pancytopenia, liver function test abnormalities, acute kidney injury, UTI and infusion reactions along with fatigue and also respiratory infections.  - multiple reactions to chemo and Mab per hx   Past Medical History    has a past medical history of Anxiety, Back pain, and GERD (gastroesophageal reflux disease).   has a past surgical history that includes IR IMAGING GUIDED PORT INSERTION (08/30/2019).    has a past medical history of Anxiety, Back pain, and GERD (gastroesophageal reflux disease).   reports that he has been smoking cigarettes. He has never used smokeless tobacco.  Past Surgical History:  Procedure Laterality Date  . IR IMAGING GUIDED PORT INSERTION  08/30/2019    Allergies  Allergen Reactions  . Rituximab Rash    Had rash, anxiety, HTN & tachycardia ~76min into 1st infusion 08/27/19     There is no immunization history on file for this patient.  Family History  Problem Relation Age of Onset  . Lymphoma Mother   . Lymphoma Father      Current Facility-Administered Medications:  .  vancomycin (VANCOREADY) IVPB 1500 mg/300 mL, 1,500 mg, Intravenous, Once, Eudelia Bunch, RPH, Last Rate: 150 mL/hr at 10/29/19 1542, 1,500 mg at 10/29/19 1542  Current Outpatient Medications:  .  acalabrutinib (CALQUENCE) 100 MG capsule, Take 1 capsule (  100 mg total) by mouth 2 (two) times daily., Disp: 60 capsule, Rfl: 2 .  albuterol (VENTOLIN HFA) 108 (90 Base) MCG/ACT inhaler, Inhale 2 puffs into the lungs every 6 (six) hours as needed for wheezing or shortness of breath., Disp: 6.7 g, Rfl: 0 .   allopurinol (ZYLOPRIM) 300 MG tablet, Take 1 tablet (300 mg total) by mouth daily., Disp: 30 tablet, Rfl: 0 .  folic acid (FOLVITE) 1 MG tablet, Take 1 tablet by mouth once daily, Disp: 30 tablet, Rfl: 0 .  furosemide (LASIX) 20 MG tablet, Take 1 tablet (20 mg total) by mouth daily., Disp: 30 tablet, Rfl: 0 .  HYDROcodone-acetaminophen (NORCO) 5-325 MG tablet, 1/2 to 1 tab PO q 6 hours prn pain, Disp: 30 tablet, Rfl: 0 .  hydrocortisone (ANUSOL-HC) 2.5 % rectal cream, Place rectally 3 (three) times daily., Disp: 30 g, Rfl: 0 .  hydrOXYzine (ATARAX/VISTARIL) 10 MG tablet, Take 1 tablet (10 mg total) by mouth every 8 (eight) hours as needed., Disp: 30 tablet, Rfl: 1 .  lidocaine-prilocaine (EMLA) cream, Apply 1 application topically as needed., Disp: 30 g, Rfl: 0 .  lidocaine-prilocaine (EMLA) cream, Apply 1 application topically as needed., Disp: 30 g, Rfl: 2 .  nicotine (NICODERM CQ - DOSED IN MG/24 HR) 7 mg/24hr patch, Place 1 patch (7 mg total) onto the skin daily. (Patient not taking: Reported on 10/15/2019), Disp: 28 patch, Rfl: 0 .  omeprazole (PRILOSEC) 20 MG capsule, Take 1 capsule (20 mg total) by mouth daily., Disp: 30 capsule, Rfl: 3 .  predniSONE (DELTASONE) 20 MG tablet, Please take 3 tablets (60 mg) daily for 7 days for 1 week, followed by 2 tablets (40 mg) daily for 7 days, followed by 1 tablet (20 mg) daily for 7 days, followed by 1/2 a tablet daily for 7 days., Disp: 46 tablet, Rfl: 0 .  prochlorperazine (COMPAZINE) 10 MG tablet, Take 1 tablet (10 mg total) by mouth every 6 (six) hours as needed for nausea or vomiting. (Patient not taking: Reported on 10/15/2019), Disp: 30 tablet, Rfl: 2 .  traMADol (ULTRAM) 50 MG tablet, Take 1 tablet (50 mg total) by mouth every 6 (six) hours as needed for moderate pain., Disp: 20 tablet, Rfl: 0   Significant Hospital Events   10/29/2019  - admit  Consults:  x  Procedures:  10/29/2019 - BiPAP following Mab infusion  Significant Diagnostic Tests:   x  Micro Data:  x  Antimicrobials:  z   Interim history/subjective:  10/29/2019 - on bipap [place] ER bed 9. S/p ativan. Tachypneic and sleepy without paradoxus  Objective   Blood pressure 105/66, pulse (!) 121, temperature (!) 103.2 F (39.6 C), temperature source Tympanic, resp. rate (!) 21, height 5\' 11"  (1.803 m), weight 72.4 kg, SpO2 99 %.    FiO2 (%):  [40 %] 40 %   Intake/Output Summary (Last 24 hours) at 10/29/2019 1704 Last data filed at 10/29/2019 1542 Gross per 24 hour  Intake 100 ml  Output --  Net 100 ml   Filed Weights   10/29/19 1335  Weight: 72.4 kg    Examination: General: Seen in the stretcher in [place] long emergency room bed 09 HENT: Diaphoresis around the neck.  On BiPAP.  No accessory muscle use no elevated JVP but prominent neck veins present.  No neck nodes Lungs: Coarse breath sounds bilaterally good air sounds bilaterally with slightly diminished on the left.  No wheezing tachypneic without accessory muscle use or paradoxus Cardiovascular: Sinus tachycardia normal  heart sounds.  No murmurs Abdomen: Soft nontender Extremities: Diffuse bilateral lymphedema right greater than left Neuro: RASS sedation score -3 status post lorazepam in the ER GU: External genitalia not examined  Resolved Hospital Problem list   X  Assessment & Plan:   ASSESSMENT / PLAN:  Diffuse bulky adenopathy B-cell lymphoma with left-sided pleural effusion and lymphedema -either fails treatment or is allergic to various treatments  -Currently acute severe life-threatening hypersensitivity reaction to [facility]  plan - Rx allergic reaction to [facility]  - doubt he can get it again - per Dr Julien Nordmann who feels overall this cancer is good prognosis      A:  Acute respiratory failure and acute hypoxemic distress with stridor and all other features of acute severe hypersensitive reaction to monoclonal antibody -improving with BiPAP and steroids and epinephrine.  Sister is concerned  about volume overload +/- anxiety  Has worsening of baseline left pleural effusion  P:   BiPAP IV steroids BD Left-sided IR guided thoracentesis Check ABG   A:   Neurologically intact but sedated at the time of evaluation after single dose of lorazepam. Sister is concerned about anxiety reaction  P:   Monitor Intubate if mental status worsens Precedex if needed for anxietyt Versed if needed    A:   Diffuse lymphedema on account of bulky adenopathy  P:  Initiate Lasix as blood pressure tolerates (wil give 1 dose now)   A: Normal echo in November 2020  P: Recheck echocardiogram   A: Sinus tachycardia   P: Monitor Cycle enzymes   A:   Febrile reaction post monoclonal antibody infusion-most likely hypersensitivity reaction but immunosuppressant at risk for infection P:   Check procalcitonin Check respiratory virus panel Check urine strep Check urine Legionella Check blood and urine culture Empiric antibiotics   A:  At risk acute kidney injury P:  Monitor Maintain hemodynamics   A:  At risk electrolyte balance P: Closely monitor and replete    A:   History of acid reflux  P:   PPI N.p.o. due to respiratory distress  A Transaminitis - new 10/28/2019 - ? Due to Mab  Plan  - monitor  A:  Baseline anemia hemoglobin 6-8 g% in the last few months prior to admission.  Status post 1 unit PRBC for hemoglobin 10.7 g% October 28, 2019   P:  - PRBC for hgb </= 6.9gm%    - exceptions are   -  if ACS susepcted/confirmed then transfuse for hgb </= 8.0gm%,  or    -  active bleeding with hemodynamic instability, then transfuse regardless of hemoglobin value   At at all times try to transfuse 1 unit prbc as possible with exception of active hemorrhage    A Baseline thrombocytopenia 50-80 and leukoepenia in the last 1 month prior to admission  P SCDs Lovenox for DVT prophylaxis once approved by oncology   A:   At risk for hypo and  hyperglycemia P:   Sliding scale insulin   Lymphedema  Plan  -Monitor with Lasix when started  - ACE wrap and leg elevation started    Best practice:  Diet: npo Pain/Anxiety/Delirium protocol (if indicated): na VAP protocol (if indicated): hob > 30 DVT prophylaxis: scd GI prophylaxis: ppi Glucose control: ssi Mobility: bed rest  Code Status: full code - ER doc d/w DPOA and confirmed full code. PCCM MD called sister DPOA [name] - Sister DPOA -[id number] -> updated  Family Communication: Sister as above  5:56 PM  - she is upset hearing this could be a Mab reaction.   Disposition: ER to ICU . PCCM primary. When stable can got [place]   The patient Crhistopher Waltner is critically ill with multiple organ systems failure and requires high complexity decision making for assessment and support, frequent evaluation and titration of therapies, application of advanced monitoring technologies and extensive interpretation of multiple databases.   Critical Care Time devoted to patient care services described in this note is  75  Minutes. This time reflects time of care of this signee Dr Brand Males. This critical care time does not reflect procedure time, or teaching time or supervisory time of PA/NP/Med student/Med Resident etc but could involve care discussion time     Dr. Brand Males, M.D., [facility].C.P Pulmonary and Critical Care Medicine Staff Physician [facility] Pager: [phone], If no answer or between  15:00h - 7:00h: call [phone]  10/29/2019 5:05 PM     LABS    PULMONARY Recent Labs  Lab 10/29/19 1404  HCO3 24.7  O2SAT 76.7    CBC Recent Labs  Lab 10/28/19 1233 10/29/19 1404  HGB 7.1* 8.0*  HCT 22.3* 25.3*  WBC 2.8* 3.4*  PLT 87* 79*    COAGULATION Recent Labs  Lab 10/29/19 1404  INR 1.3*    CARDIAC  No results for input(s): TROPONINI in the last 168  hours. No results for input(s): PROBNP in the last 168 hours.   CHEMISTRY Recent Labs  Lab 10/28/19 1233 10/29/19 1404  NA 141 138  K 3.3* 4.3  CL 105 104  CO2 25 23  GLUCOSE 104* 155*  BUN 19 21*  CREATININE 0.61 0.73  CALCIUM 8.3* 8.5*   Estimated Creatinine Clearance: 105.6 mL/min (by C-G formula based on SCr of 0.73 mg/dL).   LIVER Recent Labs  Lab 10/28/19 1233 10/29/19 1404  AST 44* 64*  ALT 57* 68*  ALKPHOS 216* 223*  BILITOT 0.6 1.3*  PROT 4.7* 5.0*  ALBUMIN 2.0* 2.2*  INR  --  1.3*     INFECTIOUS Recent Labs  Lab 10/29/19 1404  LATICACIDVEN 1.6     ENDOCRINE CBG (last 3)  No results for input(s): GLUCAP in the last 72 hours.       IMAGING x48h  - image(s) personally visualized  -   highlighted in bold DG Chest Port 1 View  Result Date: 10/29/2019 CLINICAL DATA:  Shortness of breath EXAM: PORTABLE CHEST 1 VIEW COMPARISON:  09/01/2019, CT 08/25/2019 FINDINGS: Right-sided central venous port tip over the cavoatrial region allowing for rotated patient. Suspected moderate layering right pleural effusion, probably increased from prior radiograph. Moderate left pleural effusion, also suspect increase in size. Enlarged cardiomediastinal silhouette with vascular congestion. Consolidation at the left lung base. Fluid in the right fissure. Hazy appearance of both thoraces likely due to layering fluid although underlying pulmonary edema could be contributing. No pneumothorax. IMPRESSION: Hazy appearance of the bilateral lungs since 09/01/2019, suspected to be secondary to moderate layering pleural effusions, probably increased in the interim. Cardiomegaly with vascular congestion; hazy lung appearance could also be due to associated edema. Atelectasis or pneumonia at the left base. Electronically Signed   By: Donavan Foil M.D.   On: 10/29/2019 15:19

## 2019-10-30 ENCOUNTER — Inpatient Hospital Stay (HOSPITAL_COMMUNITY): Payer: Medicaid Other

## 2019-10-30 DIAGNOSIS — J9601 Acute respiratory failure with hypoxia: Principal | ICD-10-CM

## 2019-10-30 DIAGNOSIS — J90 Pleural effusion, not elsewhere classified: Secondary | ICD-10-CM

## 2019-10-30 LAB — GLUCOSE, CAPILLARY
Glucose-Capillary: 125 mg/dL — ABNORMAL HIGH (ref 70–99)
Glucose-Capillary: 129 mg/dL — ABNORMAL HIGH (ref 70–99)
Glucose-Capillary: 142 mg/dL — ABNORMAL HIGH (ref 70–99)
Glucose-Capillary: 319 mg/dL — ABNORMAL HIGH (ref 70–99)
Glucose-Capillary: 335 mg/dL — ABNORMAL HIGH (ref 70–99)
Glucose-Capillary: 175 mg/dL — ABNORMAL HIGH (ref 70–99)

## 2019-10-30 LAB — CBC WITH DIFFERENTIAL/PLATELET
Abs Immature Granulocytes: 0.02 10*3/uL (ref 0.00–0.07)
Basophils Absolute: 0 10*3/uL (ref 0.0–0.1)
Basophils Relative: 0 %
Eosinophils Absolute: 0 10*3/uL (ref 0.0–0.5)
Eosinophils Relative: 0 %
HCT: 17.4 % — ABNORMAL LOW (ref 39.0–52.0)
Hemoglobin: 5.2 g/dL — CL (ref 13.0–17.0)
Immature Granulocytes: 1 %
Lymphocytes Relative: 1 %
Lymphs Abs: 0 10*3/uL — ABNORMAL LOW (ref 0.7–4.0)
MCH: 28.4 pg (ref 26.0–34.0)
MCHC: 29.9 g/dL — ABNORMAL LOW (ref 30.0–36.0)
MCV: 95.1 fL (ref 80.0–100.0)
Monocytes Absolute: 0.2 10*3/uL (ref 0.1–1.0)
Monocytes Relative: 7 %
Neutro Abs: 1.9 10*3/uL (ref 1.7–7.7)
Neutrophils Relative %: 91 %
Platelets: 56 10*3/uL — ABNORMAL LOW (ref 150–400)
RBC: 1.83 MIL/uL — ABNORMAL LOW (ref 4.22–5.81)
RDW: 17.9 % — ABNORMAL HIGH (ref 11.5–15.5)
WBC: 2.1 10*3/uL — ABNORMAL LOW (ref 4.0–10.5)
nRBC: 0 % (ref 0.0–0.2)

## 2019-10-30 LAB — PREPARE RBC (CROSSMATCH)

## 2019-10-30 LAB — HEMOGLOBIN A1C
Hgb A1c MFr Bld: 5.7 % — ABNORMAL HIGH (ref 4.8–5.6)
Mean Plasma Glucose: 116.89 mg/dL

## 2019-10-30 LAB — CBC
HCT: 22.5 % — ABNORMAL LOW (ref 39.0–52.0)
Hemoglobin: 7 g/dL — ABNORMAL LOW (ref 13.0–17.0)
MCH: 28.7 pg (ref 26.0–34.0)
MCHC: 31.1 g/dL (ref 30.0–36.0)
MCV: 92.2 fL (ref 80.0–100.0)
Platelets: 69 10*3/uL — ABNORMAL LOW (ref 150–400)
RBC: 2.44 MIL/uL — ABNORMAL LOW (ref 4.22–5.81)
RDW: 17.7 % — ABNORMAL HIGH (ref 11.5–15.5)
WBC: 2.9 10*3/uL — ABNORMAL LOW (ref 4.0–10.5)
nRBC: 0 % (ref 0.0–0.2)

## 2019-10-30 LAB — BASIC METABOLIC PANEL
Anion gap: 10 (ref 5–15)
BUN: 24 mg/dL — ABNORMAL HIGH (ref 6–20)
CO2: 26 mmol/L (ref 22–32)
Calcium: 8.6 mg/dL — ABNORMAL LOW (ref 8.9–10.3)
Chloride: 105 mmol/L (ref 98–111)
Creatinine, Ser: 0.49 mg/dL — ABNORMAL LOW (ref 0.61–1.24)
GFR calc Af Amer: >60 mL/min (ref >60)
GFR calc non Af Amer: >60 mL/min (ref >60)
Glucose, Bld: 136 mg/dL — ABNORMAL HIGH (ref 70–99)
Potassium: 3.4 mmol/L — ABNORMAL LOW (ref 3.5–5.1)
Sodium: 141 mmol/L (ref 135–145)

## 2019-10-30 LAB — STREP PNEUMONIAE URINARY ANTIGEN: Strep Pneumo Urinary Antigen: NEGATIVE

## 2019-10-30 LAB — MAGNESIUM: Magnesium: 1.4 mg/dL — ABNORMAL LOW (ref 1.7–2.4)

## 2019-10-30 LAB — HEMOGLOBIN AND HEMATOCRIT, BLOOD
HCT: 29.1 % — ABNORMAL LOW (ref 39.0–52.0)
Hemoglobin: 9.3 g/dL — ABNORMAL LOW (ref 13.0–17.0)

## 2019-10-30 LAB — PHOSPHORUS: Phosphorus: 4.5 mg/dL (ref 2.5–4.6)

## 2019-10-30 MED ORDER — HYDROXYZINE HCL 10 MG PO TABS
10.0000 mg | ORAL_TABLET | Freq: Three times a day (TID) | ORAL | Status: DC | PRN
  Administered 2019-10-30 – 2019-11-12 (×30): 10 mg via ORAL
  Filled 2019-10-30 (×32): qty 1

## 2019-10-30 MED ORDER — INSULIN ASPART 100 UNIT/ML ~~LOC~~ SOLN
0.0000 [IU] | Freq: Three times a day (TID) | SUBCUTANEOUS | Status: DC
  Administered 2019-10-30: 12:00:00 2 [IU] via SUBCUTANEOUS
  Administered 2019-10-30: 16:00:00 11 [IU] via SUBCUTANEOUS
  Administered 2019-10-31: 08:00:00 3 [IU] via SUBCUTANEOUS
  Administered 2019-10-31: 8 [IU] via SUBCUTANEOUS
  Administered 2019-11-01: 5 [IU] via SUBCUTANEOUS
  Administered 2019-11-02: 2 [IU] via SUBCUTANEOUS
  Administered 2019-11-03: 3 [IU] via SUBCUTANEOUS
  Administered 2019-11-03 – 2019-11-06 (×2): 2 [IU] via SUBCUTANEOUS
  Administered 2019-11-06: 5 [IU] via SUBCUTANEOUS
  Administered 2019-11-06: 3 [IU] via SUBCUTANEOUS
  Administered 2019-11-07: 2 [IU] via SUBCUTANEOUS
  Administered 2019-11-08 (×2): 3 [IU] via SUBCUTANEOUS
  Administered 2019-11-09: 2 [IU] via SUBCUTANEOUS
  Administered 2019-11-10: 3 [IU] via SUBCUTANEOUS
  Administered 2019-11-10 – 2019-11-11 (×2): 2 [IU] via SUBCUTANEOUS
  Administered 2019-11-11: 3 [IU] via SUBCUTANEOUS
  Administered 2019-11-12 (×2): 2 [IU] via SUBCUTANEOUS

## 2019-10-30 MED ORDER — ACETAMINOPHEN 325 MG PO TABS
650.0000 mg | ORAL_TABLET | Freq: Four times a day (QID) | ORAL | Status: DC | PRN
  Administered 2019-10-31: 650 mg via ORAL
  Filled 2019-10-30: qty 2

## 2019-10-30 MED ORDER — IPRATROPIUM-ALBUTEROL 0.5-2.5 (3) MG/3ML IN SOLN
3.0000 mL | RESPIRATORY_TRACT | Status: DC | PRN
  Administered 2019-10-30 – 2019-10-31 (×4): 3 mL via RESPIRATORY_TRACT
  Filled 2019-10-30 (×5): qty 3

## 2019-10-30 MED ORDER — TRAMADOL HCL 50 MG PO TABS
50.0000 mg | ORAL_TABLET | Freq: Four times a day (QID) | ORAL | Status: DC | PRN
  Administered 2019-10-31 – 2019-11-12 (×25): 50 mg via ORAL
  Filled 2019-10-30 (×26): qty 1

## 2019-10-30 MED ORDER — MAGNESIUM SULFATE 2 GM/50ML IV SOLN
2.0000 g | Freq: Once | INTRAVENOUS | Status: AC
  Administered 2019-10-30: 2 g via INTRAVENOUS
  Filled 2019-10-30: qty 50

## 2019-10-30 MED ORDER — METHYLPREDNISOLONE SODIUM SUCC 40 MG IJ SOLR
40.0000 mg | Freq: Three times a day (TID) | INTRAMUSCULAR | Status: DC
  Administered 2019-10-30 – 2019-11-01 (×6): 40 mg via INTRAVENOUS
  Filled 2019-10-30 (×6): qty 1

## 2019-10-30 MED ORDER — ALLOPURINOL 100 MG PO TABS
300.0000 mg | ORAL_TABLET | Freq: Every day | ORAL | Status: DC
  Administered 2019-10-30 – 2019-11-12 (×14): 300 mg via ORAL
  Filled 2019-10-30 (×15): qty 3

## 2019-10-30 MED ORDER — POTASSIUM CHLORIDE 10 MEQ/100ML IV SOLN
10.0000 meq | INTRAVENOUS | Status: AC
  Administered 2019-10-30 (×4): 10 meq via INTRAVENOUS
  Filled 2019-10-30 (×4): qty 100

## 2019-10-30 MED ORDER — SODIUM CHLORIDE 0.9% IV SOLUTION: Freq: Once | INTRAVENOUS | Status: AC

## 2019-10-30 MED ORDER — PANTOPRAZOLE SODIUM 40 MG PO TBEC
40.0000 mg | DELAYED_RELEASE_TABLET | Freq: Every day | ORAL | Status: DC
  Administered 2019-10-30 – 2019-11-04 (×6): 40 mg via ORAL
  Filled 2019-10-30 (×6): qty 1

## 2019-10-30 MED ORDER — FOLIC ACID 1 MG PO TABS
1.0000 mg | ORAL_TABLET | Freq: Every day | ORAL | Status: DC
  Administered 2019-10-30 – 2019-11-12 (×14): 1 mg via ORAL
  Filled 2019-10-30 (×14): qty 1

## 2019-10-30 MED ORDER — HYDROCODONE-ACETAMINOPHEN 5-325 MG PO TABS
1.0000 | ORAL_TABLET | Freq: Four times a day (QID) | ORAL | Status: DC | PRN
  Administered 2019-10-30 – 2019-11-12 (×35): 1 via ORAL
  Filled 2019-10-30 (×36): qty 1

## 2019-10-30 MED ORDER — INSULIN ASPART 100 UNIT/ML ~~LOC~~ SOLN
0.0000 [IU] | Freq: Every day | SUBCUTANEOUS | Status: DC
  Administered 2019-11-05 – 2019-11-11 (×2): 2 [IU] via SUBCUTANEOUS

## 2019-10-30 MED ORDER — FUROSEMIDE 10 MG/ML IJ SOLN
60.0000 mg | Freq: Once | INTRAMUSCULAR | Status: AC
  Administered 2019-10-30: 10:00:00 60 mg via INTRAVENOUS
  Filled 2019-10-30: qty 6

## 2019-10-30 NOTE — Progress Notes (Signed)
[facility] Progress Note Patient Name: Fatima Shimomura DOB: [birth year] MRN: [id number]   Date of Service  10/30/2019  HPI/Events of Note  Notified of tachycardia in the 120s appears sinus. According to bedside RN maybe related to agitation. K 3.4  eICU Interventions   Ordered to correct K 10 meqs IV x 4 doses  If persistently tachycardic previously ordered Versed will be given     Intervention Category Major Interventions: Arrhythmia - evaluation and management  Shona Needles Marceil Welp 10/30/2019, 4:18 AM

## 2019-10-30 NOTE — Progress Notes (Signed)
CRITICAL VALUE ALERT  Critical Value: Hgb 5.2  Date & Time Notied:  10/30/2019 at 1127  Provider Notified: Dr. Halford Chessman  Orders Received/Actions taken: No response from Provider. Report given to RN Judson Roch who assumed care of pt.

## 2019-10-30 NOTE — Progress Notes (Signed)
E-link provider notified of sustained HR in the 130s. No new orders yet. RN will continue to monitor.

## 2019-10-30 NOTE — Progress Notes (Signed)
Patient had bag of items in room including Preparation H ointment, 4 Hydroxyzine tablets. 1 Albuterol inhaler, 1 Escitalopram tablet, 24 allergy tablets, 2 cans of chewing tobacco, and a full container of Equate pain reliever tablets. Patient aware nurse has items and reported that sister would be able to pick up items during the day.  Patient also educated that these items are not allowed to be kept in patients room while in the hospital.

## 2019-10-30 NOTE — Progress Notes (Signed)
Updated pt's sister,Laurie about pt's status/plan of care per pt's approval. Sister wanted MD to given her a call. Will notify Dr. Halford Chessman of sister's request.  Madelin Headings.

## 2019-10-30 NOTE — Progress Notes (Signed)
Isaac Pierce   DOB:1961/03/12   [id number]   [id number]  Hem/Onc follow up note   Subjective: Pt was admitted from our infusion room for shortness of breath yesterday. He still has moderate dyspnea this morning, not able to lay flat, x-ray showed bilateral moderate to large pleural effusion, waiting for thoracentesis. He was not on oxygen when I saw him.    Objective:  Vitals:   10/30/19 0700 10/30/19 0851  BP: 111/74   Pulse: (!) 117   Resp: (!) 26   Temp:  99 F (37.2 C)  SpO2: 97%     Body mass index is 21.98 kg/m.  Intake/Output Summary (Last 24 hours) at 10/30/2019 1100 Last data filed at 10/30/2019 0600 Gross per 24 hour  Intake 530.91 ml  Output 1000 ml  Net -469.09 ml     Sclerae unicteric  Oropharynx clear  No peripheral adenopathy  Lungs clear, decreased breath sound on both lung base   Heart regular rate and rhythm, tachy   Abdomen benign  Neuro nonfocal   CBG (last 3)  Recent Labs    10/29/19 2303 10/30/19 0414 10/30/19 0747  GLUCAP 137* 125* 129*     Labs:  Urine Studies No results for input(s): UHGB, CRYS in the last 72 hours.  Invalid input(s): UACOL, UAPR, USPG, UPH, UTP, UGL, UKET, UBIL, UNIT, UROB, ULEU, UEPI, UWBC, URBC, UBAC, CAST, [place], [place]  Basic Metabolic Panel: Recent Labs  Lab 10/28/19 1233 10/28/19 1233 10/29/19 1404 10/30/19 0125  NA 141  --  138 141  K 3.3*   < > 4.3 3.4*  CL 105  --  104 105  CO2 25  --  23 26  GLUCOSE 104*  --  155* 136*  BUN 19  --  21* 24*  CREATININE 0.61  --  0.73 0.49*  CALCIUM 8.3*  --  8.5* 8.6*  MG  --   --   --  1.4*  PHOS  --   --   --  4.5   < > = values in this interval not displayed.   GFR Estimated Creatinine Clearance: 104.3 mL/min (A) (by C-G formula based on SCr of 0.49 mg/dL (L)). Liver Function Tests: Recent Labs  Lab 10/28/19 1233 10/29/19 1404  AST 44* 64*  ALT 57* 68*  ALKPHOS 216* 223*  BILITOT 0.6 1.3*  PROT 4.7* 5.0*  ALBUMIN 2.0* 2.2*   No results for  input(s): LIPASE, AMYLASE in the last 168 hours. No results for input(s): AMMONIA in the last 168 hours. Coagulation profile Recent Labs  Lab 10/29/19 1404  INR 1.3*    CBC: Recent Labs  Lab 10/28/19 1233 10/29/19 1404 10/29/19 2048 10/30/19 0125  WBC 2.8* 3.4* 2.2* 2.9*  NEUTROABS 2.4 3.2 1.9  --   HGB 7.1* 8.0* 7.1* 7.0*  HCT 22.3* 25.3* 23.8* 22.5*  MCV 88.8 93.0 94.1 92.2  PLT 87* 79* 72* 69*   Cardiac Enzymes: No results for input(s): CKTOTAL, CKMB, CKMBINDEX, TROPONINI in the last 168 hours. BNP: Invalid input(s): POCBNP CBG: Recent Labs  Lab 10/29/19 1722 10/29/19 2059 10/29/19 2303 10/30/19 0414 10/30/19 0747  GLUCAP 210* 174* 137* 125* 129*   D-Dimer No results for input(s): DDIMER in the last 72 hours. Hgb A1c No results for input(s): HGBA1C in the last 72 hours. Lipid Profile No results for input(s): CHOL, HDL, LDLCALC, TRIG, CHOLHDL, LDLDIRECT in the last 72 hours. Thyroid function studies No results for input(s): TSH, T4TOTAL, T3FREE, THYROIDAB in the last 72 hours.  Invalid input(s): FREET3 Anemia work up No results for input(s): VITAMINB12, FOLATE, FERRITIN, TIBC, IRON, RETICCTPCT in the last 72 hours. Microbiology Recent Results (from the past 240 hour(s))  Respiratory Panel by PCR     Status: None   Collection Time: 10/29/19 11:46 AM   Specimen: Nasopharyngeal Swab; Respiratory  Result Value Ref Range Status   Adenovirus NOT DETECTED NOT DETECTED Final   Coronavirus 229E NOT DETECTED NOT DETECTED Final    Comment: (NOTE) The Coronavirus on the Respiratory Panel, DOES NOT test for the novel  Coronavirus (2019 nCoV)    Coronavirus HKU1 NOT DETECTED NOT DETECTED Final   Coronavirus NL63 NOT DETECTED NOT DETECTED Final   Coronavirus OC43 NOT DETECTED NOT DETECTED Final   Metapneumovirus NOT DETECTED NOT DETECTED Final   Rhinovirus / Enterovirus NOT DETECTED NOT DETECTED Final   Influenza A NOT DETECTED NOT DETECTED Final   Influenza B  NOT DETECTED NOT DETECTED Final   Parainfluenza Virus 1 NOT DETECTED NOT DETECTED Final   Parainfluenza Virus 2 NOT DETECTED NOT DETECTED Final   Parainfluenza Virus 3 NOT DETECTED NOT DETECTED Final   Parainfluenza Virus 4 NOT DETECTED NOT DETECTED Final   Respiratory Syncytial Virus NOT DETECTED NOT DETECTED Final   Bordetella pertussis NOT DETECTED NOT DETECTED Final   Chlamydophila pneumoniae NOT DETECTED NOT DETECTED Final   Mycoplasma pneumoniae NOT DETECTED NOT DETECTED Final    Comment: Performed at [facility], [address], [place], [place] [address]  Respiratory Panel by RT PCR (Flu A&B, Covid) - Nasopharyngeal Swab     Status: None   Collection Time: 10/29/19  2:46 PM   Specimen: Nasopharyngeal Swab  Result Value Ref Range Status   SARS Coronavirus 2 by RT PCR NEGATIVE NEGATIVE Final    Comment: (NOTE) SARS-CoV-2 target nucleic acids are NOT DETECTED. The SARS-CoV-2 RNA is generally detectable in upper respiratoy specimens during the acute phase of infection. The lowest concentration of SARS-CoV-2 viral copies this assay can detect is 131 copies/mL. A negative result does not preclude SARS-Cov-2 infection and should not be used as the sole basis for treatment or other patient management decisions. A negative result may occur with  improper specimen collection/handling, submission of specimen other than nasopharyngeal swab, presence of viral mutation(s) within the areas targeted by this assay, and inadequate number of viral copies (<131 copies/mL). A negative result must be combined with clinical observations, patient history, and epidemiological information. The expected result is Negative. Fact Sheet for Patients:  PinkCheek.be Fact Sheet for Healthcare Providers:  GravelBags.it This test is not yet ap proved or cleared by the [place] FDA and  has been authorized for detection and/or diagnosis of  SARS-CoV-2 by FDA under an Emergency Use Authorization (EUA). This EUA will remain  in effect (meaning this test can be used) for the duration of the COVID-19 declaration under Section 564(b)(1) of the Act, 21 U.S.C. section 360bbb-3(b)(1), unless the authorization is terminated or revoked sooner.    Influenza A by PCR NEGATIVE NEGATIVE Final   Influenza B by PCR NEGATIVE NEGATIVE Final    Comment: (NOTE) The Xpert Xpress SARS-CoV-2/FLU/RSV assay is intended as an aid in  the diagnosis of influenza from Nasopharyngeal swab specimens and  should not be used as a sole basis for treatment. Nasal washings and  aspirates are unacceptable for Xpert Xpress SARS-CoV-2/FLU/RSV  testing. Fact Sheet for Patients: PinkCheek.be Fact Sheet for Healthcare Providers: GravelBags.it This test is not yet approved or cleared by the  [place] Architectural technologist and  has been authorized for detection and/or diagnosis of SARS-CoV-2 by  FDA under an Print production planner (EUA). This EUA will remain  in effect (meaning this test can be used) for the duration of the  Covid-19 declaration under Section 564(b)(1) of the Act, 21  U.S.C. section 360bbb-3(b)(1), unless the authorization is  terminated or revoked. Performed at [facility], [place] [address], [place], [place] [address]   MRSA PCR Screening     Status: None   Collection Time: 10/29/19  8:39 PM   Specimen: Nasopharyngeal  Result Value Ref Range Status   MRSA by PCR NEGATIVE NEGATIVE Final    Comment:        The GeneXpert MRSA Assay (FDA approved for NASAL specimens only), is one component of a comprehensive MRSA colonization surveillance program. It is not intended to diagnose MRSA infection nor to guide or monitor treatment for MRSA infections. Performed at [facility], [place] [address], [place], [place] [address]       Studies:  CT CHEST WO  CONTRAST  Result Date: 10/29/2019 CLINICAL DATA:  With lymphoma, abnormal radiograph EXAM: CT CHEST WITHOUT CONTRAST TECHNIQUE: Multidetector CT imaging of the chest was performed following the standard protocol without IV contrast. COMPARISON:  Radiograph 10/28/2018, CT 08/25/2019 FINDINGS: Cardiovascular: Left IJ approach Port-A-Cath is accessed with the tip positioned at the right atrium. Normal cardiac size. Redemonstration of the extensive pericardial soft tissue attenuation lesions these appear to extend more superiorly towards the cardiac base than on comparison study including a larger crescentic amount of soft tissue measuring approximately 6.9 cm anteroposterior and 1.7 cm in thickness near the level of the pulmonary trunk (2/79). Central pulmonary arteries are normal caliber. Atherosclerotic plaque within the normal caliber aorta. Normal 3 vessel branching of the aortic arch with minimal plaque in the great vessels. Mediastinum/Nodes: Decrease in the size of bulky thoracic lymphadenopathy compatible with patient's known lymphoproliferative disorder. Index lymph nodes include: *A high right paratracheal node measuring 12 mm short axis (2/45), previously 23 mm *AP window lymph node measuring 24 mm (4/35), previously 29 mm *Right hilar node measuring 15 mm (2/85), previously 21 mm *Subcarinal node measuring 18 mm (2/69), previously 26 mm *Right inter lobar lymph node on comparison is difficult to assess given absence of contrast media. Thyroid gland is unremarkable. No acute abnormality of the trachea or esophagus. Lungs/Pleura: Large bilateral pleural effusions are noted. Dense bandlike areas of atelectasis are noted in both lungs. Multiple nodules are seen in the right middle and lower lobe examples include a 8 mm nodule laterally (4/102) in lower lobe and a subpleural 6 mm nodule anteriorly in the right middle lobe (4/98). No visible nodules seen in the left lung though underlying atelectasis could  obscure the presence of nodules or masses. Upper Abdomen: Marked splenomegaly. Some wedge-shaped hypoattenuation in the periphery of the spleen measuring 1.6 x 3.2 cm, compatible with sequela of remote splenic infarct. Indeterminate exophytic 12 mm lesion arising from the upper pole right kidney. Musculoskeletal: Multilevel degenerative changes are present in the imaged portions of the spine. Evaluation of the chest wall limited given extensive motion artifact which may limit detection of sternal and rib fractures. No gross acute osseous abnormality or suspicious osseous lesions. IMPRESSION: 1. Mixed response of patient's lymphoproliferative disease with overall decrease in the size of bulky thoracic lymphadenopathy but with increasing pericardial soft tissue masses and multiple new lung nodules. 2. Large bilateral pleural effusions with associated compressive  atelectasis. 3. 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 4. Indeterminate 12 mm lesion arising from the upper pole of the right kidney. Indeterminate on noncontrast CT. Could consider follow-up with nonemergent outpatient MR or CT. 5. Evaluation of the chest wall limited given extensive motion artifact which may limit detection of sternal and rib fractures. No gross acute osseous abnormality. 6.  Aortic Atherosclerosis (ICD10-I70.0). Electronically Signed   By: Lovena Le M.D.   On: 10/29/2019 20:36   DG Chest Port 1 View  Result Date: 10/30/2019 CLINICAL DATA:  Respiratory failure. EXAM: PORTABLE CHEST 1 VIEW COMPARISON:  Chest radiograph 10/29/2019 FINDINGS: Right anterior chest wall Port-A-Cath is present with tip projecting over the superior vena cava. Monitoring leads overlie the patient. Stable enlarged cardiac and mediastinal contours. Persistent moderate to large layering bilateral pleural effusions with underlying pulmonary consolidation. No pneumothorax. IMPRESSION: Persistent moderate to large layering bilateral effusions  with underlying consolidation. Electronically Signed   By: Lovey Newcomer M.D.   On: 10/30/2019 04:58   DG Chest Port 1 View  Result Date: 10/29/2019 CLINICAL DATA:  Shortness of breath EXAM: PORTABLE CHEST 1 VIEW COMPARISON:  09/01/2019, CT 08/25/2019 FINDINGS: Right-sided central venous port tip over the cavoatrial region allowing for rotated patient. Suspected moderate layering right pleural effusion, probably increased from prior radiograph. Moderate left pleural effusion, also suspect increase in size. Enlarged cardiomediastinal silhouette with vascular congestion. Consolidation at the left lung base. Fluid in the right fissure. Hazy appearance of both thoraces likely due to layering fluid although underlying pulmonary edema could be contributing. No pneumothorax. IMPRESSION: Hazy appearance of the bilateral lungs since 09/01/2019, suspected to be secondary to moderate layering pleural effusions, probably increased in the interim. Cardiomegaly with vascular congestion; hazy lung appearance could also be due to associated edema. Atelectasis or pneumonia at the left base. Electronically Signed   By: Donavan Foil M.D.   On: 10/29/2019 15:19    Assessment: 60 y.o. with recently diagnosed stage IV CLL/SLL, on chemo, admitted for dyspnea and respiratory failure   1.  Acute hypoxic respite failure from allergic reaction to Obinutuzumab and bilateral pleural effusion  2. Fever, on broad antibiotics  3. Stage IV SLL, on chemo  4. Pancytopenia from bone marrorw involvement of SLL and chemo    Plan: -I assume his allergic reaction has resolved by now, and his respiratory distress is probably related to his large bilateral pleural effusion, he is waiting for thoracentesis. -Antibiotics per ICU team -consider blood transfusion later today or tomorrow, keep Hg>7.0 -I reassured pt that he has many other treatment options for his SLL, and will be decided by his primary oncologist Dr. Earlie Server after his  discharge. -will continue follow up -appreciate the excellent care from ICU team    Truitt Merle, MD 10/30/2019  11:00 AM

## 2019-10-30 NOTE — Progress Notes (Signed)
NAME:  Isaac Pierce, MRN:  [id number], DOB:  07/26/1961, LOS: 1 ADMISSION DATE:  10/29/2019, CONSULTATION DATE:  10/29/2019  REFERRING MD:  Dr Francia Greaves of [facility], CHIEF COMPLAINT:  Acute resp failure following Mab infussion   Brief History   59 yo male with hx of CLL/small B cell lymphoma failed CHOP, and started obinutuzumab with cycle 1 on January 22.  Received PRBC transfusion January 28.  On January 29 was started on cycle 2 of CD20 antibody, and then developed stridor with diaphoresis and respiratory distress.  Treated with steroids and epinephrine in ER, and required Bipap.  Past Medical History  GERD, Anxiety, Back pain  Significant Hospital Events   1/29 admit  Consults:    Procedures:    Significant Diagnostic Tests:  CT chest 1/29 >> decreased size of thoracic LN, increased size of pericardial soft tissue masses and multiple new lung nodules, large b/l effusions, splenomegaly  Micro Data:  SARS CoV2 PCR 1/29 >> negative Influenza PCR 1/29 >> A and B negative Blood 1/29 >> RVP 1/29 >> negative Urine 1/29 >> Pneumococcal Ag 1/29 >> negative Legionella Ag 1/29 >>   Antimicrobials:  Vancomycin 1/29 >> Cefepime 1/29 >>   Interim history/subjective:  Wants something to eat.  Objective   Blood pressure 111/74, pulse (!) 117, temperature 98.9 F (37.2 C), resp. rate (!) 26, height 5\' 11"  (1.803 m), weight 71.5 kg, SpO2 97 %.    FiO2 (%):  [40 %] 40 %   Intake/Output Summary (Last 24 hours) at 10/30/2019 0814 Last data filed at 10/30/2019 0600 Gross per 24 hour  Intake 530.91 ml  Output 1000 ml  Net -469.09 ml   Filed Weights   10/29/19 1335 10/30/19 0232  Weight: 72.4 kg 71.5 kg    Examination:  General - alert Eyes - pupils reactive ENT - Bipap mask on Cardiac - regular rate/rhythm, no murmur Chest - decreased BS at bases, no wheeze Abdomen - soft, non tender, + bowel sounds Extremities - 2+ edema Skin - no rashes Neuro - normal strength, moves  extremities, follows commands  CXR (reviewed by me) - cardiomegaly, Lt > Rt pleural effusions   Resolved Hospital Problem list     Assessment & Plan:   Acute hypoxic respiratory failure from allergic reaction to obinutuzumab. Bilateral pleural effusions. - oxygen to keep SpO2 > 92% - lasix 60 mg IV x one on 1/30 - f/u CXR - change solumedrol to 40 mg q8h - order for IR to do thoracentesis placed on 1/29  Fever. - day 2 of ABx - f/u culture results  Hypokalemia, hypomagnesemia. - replace as needed  CLL/Small B cell lymphoma. - followed by Dr. Julien Nordmann as outpt  Anemia, thrombocytopenia of chronic disease. - f/u CBC - transfuse for Hb < 7 or significant bleeding  Hx of GERD. - protonix  Steroid induced hyperglycemia. - SSI  Best practice:  Diet: regular diet DVT prophylaxis: scd GI prophylaxis: ppi Mobility: bed rest Code status: full code Disposition: SDU  Labs:   CMP Latest Ref Rng & Units 10/30/2019 10/29/2019 10/28/2019  Glucose 70 - 99 mg/dL 136(H) 155(H) 104(H)  BUN 6 - 20 mg/dL 24(H) 21(H) 19  Creatinine 0.61 - 1.24 mg/dL 0.49(L) 0.73 0.61  Sodium 135 - 145 mmol/L 141 138 141  Potassium 3.5 - 5.1 mmol/L 3.4(L) 4.3 3.3(L)  Chloride 98 - 111 mmol/L 105 104 105  CO2 22 - 32 mmol/L 26 23 25   Calcium 8.9 - 10.3 mg/dL 8.6(L) 8.5(L)  8.3(L)  Total Protein 6.5 - 8.1 g/dL - 5.0(L) 4.7(L)  Total Bilirubin 0.3 - 1.2 mg/dL - 1.3(H) 0.6  Alkaline Phos 38 - 126 U/L - 223(H) 216(H)  AST 15 - 41 U/L - 64(H) 44(H)  ALT 0 - 44 U/L - 68(H) 57(H)    CBC Latest Ref Rng & Units 10/30/2019 10/29/2019 10/29/2019  WBC 4.0 - 10.5 K/uL 2.9(L) 2.2(L) 3.4(L)  Hemoglobin 13.0 - 17.0 g/dL 7.0(L) 7.1(L) 8.0(L)  Hematocrit 39.0 - 52.0 % 22.5(L) 23.8(L) 25.3(L)  Platelets 150 - 400 K/uL 69(L) 72(L) 79(L)    ABG    Component Value Date/Time   PHART 7.404 10/29/2019 2140   PCO2ART 42.5 10/29/2019 2140   PO2ART 59.5 (L) 10/29/2019 2140   HCO3 26.0 10/29/2019 2140   ACIDBASEDEF  1.4 10/29/2019 1404   O2SAT 87.4 10/29/2019 2140    CBG (last 3)  Recent Labs    10/29/19 2303 10/30/19 0414 10/30/19 [place], MD [facility] Pulmonary/Critical Care 10/30/2019, 8:33 AM

## 2019-10-31 ENCOUNTER — Inpatient Hospital Stay (HOSPITAL_COMMUNITY): Payer: Medicaid Other

## 2019-10-31 ENCOUNTER — Other Ambulatory Visit: Payer: Self-pay

## 2019-10-31 LAB — COMPREHENSIVE METABOLIC PANEL
ALT: 85 U/L — ABNORMAL HIGH (ref 0–44)
AST: 81 U/L — ABNORMAL HIGH (ref 15–41)
Albumin: 1.9 g/dL — ABNORMAL LOW (ref 3.5–5.0)
Alkaline Phosphatase: 176 U/L — ABNORMAL HIGH (ref 38–126)
Anion gap: 12 (ref 5–15)
BUN: 21 mg/dL — ABNORMAL HIGH (ref 6–20)
CO2: 25 mmol/L (ref 22–32)
Calcium: 9.3 mg/dL (ref 8.9–10.3)
Chloride: 104 mmol/L (ref 98–111)
Creatinine, Ser: 0.56 mg/dL — ABNORMAL LOW (ref 0.61–1.24)
GFR calc Af Amer: >60 mL/min (ref >60)
GFR calc non Af Amer: >60 mL/min (ref >60)
Glucose, Bld: 144 mg/dL — ABNORMAL HIGH (ref 70–99)
Potassium: 3.7 mmol/L (ref 3.5–5.1)
Sodium: 141 mmol/L (ref 135–145)
Total Bilirubin: 0.6 mg/dL (ref 0.3–1.2)
Total Protein: 5 g/dL — ABNORMAL LOW (ref 6.5–8.1)

## 2019-10-31 LAB — PROTEIN, PLEURAL OR PERITONEAL FLUID: Total protein, fluid: <3 g/dL

## 2019-10-31 LAB — CBC
HCT: 30.8 % — ABNORMAL LOW (ref 39.0–52.0)
Hemoglobin: 9.9 g/dL — ABNORMAL LOW (ref 13.0–17.0)
MCH: 29.5 pg (ref 26.0–34.0)
MCHC: 32.1 g/dL (ref 30.0–36.0)
MCV: 91.7 fL (ref 80.0–100.0)
Platelets: 111 10*3/uL — ABNORMAL LOW (ref 150–400)
RBC: 3.36 MIL/uL — ABNORMAL LOW (ref 4.22–5.81)
RDW: 16.8 % — ABNORMAL HIGH (ref 11.5–15.5)
WBC: 4.9 10*3/uL (ref 4.0–10.5)
nRBC: 0 % (ref 0.0–0.2)

## 2019-10-31 LAB — LACTATE DEHYDROGENASE, PLEURAL OR PERITONEAL FLUID: LD, Fluid: 2250 U/L — ABNORMAL HIGH (ref 3–23)

## 2019-10-31 LAB — RETICULOCYTES
Immature Retic Fract: 19.8 % — ABNORMAL HIGH (ref 2.3–15.9)
RBC.: 3.31 MIL/uL — ABNORMAL LOW (ref 4.22–5.81)
Retic Count, Absolute: 66.5 10*3/uL (ref 19.0–186.0)
Retic Ct Pct: 2 % (ref 0.4–3.1)

## 2019-10-31 LAB — LEGIONELLA PNEUMOPHILA SEROGP 1 UR AG: L. pneumophila Serogp 1 Ur Ag: NEGATIVE

## 2019-10-31 LAB — BODY FLUID CELL COUNT WITH DIFFERENTIAL
Eos, Fluid: 0 %
Lymphs, Fluid: 81 %
Monocyte-Macrophage-Serous Fluid: 3 % — ABNORMAL LOW (ref 50–90)
Neutrophil Count, Fluid: 16 % (ref 0–25)
Total Nucleated Cell Count, Fluid: 12552 cu mm — ABNORMAL HIGH (ref 0–1000)

## 2019-10-31 LAB — GLUCOSE, CAPILLARY
Glucose-Capillary: 152 mg/dL — ABNORMAL HIGH (ref 70–99)
Glucose-Capillary: 256 mg/dL — ABNORMAL HIGH (ref 70–99)
Glucose-Capillary: 111 mg/dL — ABNORMAL HIGH (ref 70–99)
Glucose-Capillary: 101 mg/dL — ABNORMAL HIGH (ref 70–99)

## 2019-10-31 LAB — URINE CULTURE: Culture: NO GROWTH

## 2019-10-31 LAB — GLUCOSE, PLEURAL OR PERITONEAL FLUID: Glucose, Fluid: 87 mg/dL

## 2019-10-31 LAB — LACTATE DEHYDROGENASE: LDH: 499 U/L — ABNORMAL HIGH (ref 98–192)

## 2019-10-31 LAB — MAGNESIUM: Magnesium: 1.6 mg/dL — ABNORMAL LOW (ref 1.7–2.4)

## 2019-10-31 MED ORDER — POTASSIUM CHLORIDE CRYS ER 20 MEQ PO TBCR
40.0000 meq | EXTENDED_RELEASE_TABLET | Freq: Once | ORAL | Status: AC
  Administered 2019-10-31: 11:00:00 40 meq via ORAL
  Filled 2019-10-31: qty 2

## 2019-10-31 MED ORDER — PHENYLEPH-SHARK LIV OIL-MO-PET 0.25-3-14-71.9 % RE OINT
TOPICAL_OINTMENT | Freq: Two times a day (BID) | RECTAL | Status: DC | PRN
  Filled 2019-10-31: qty 28.4

## 2019-10-31 MED ORDER — IPRATROPIUM-ALBUTEROL 0.5-2.5 (3) MG/3ML IN SOLN
3.0000 mL | RESPIRATORY_TRACT | Status: DC | PRN
  Administered 2019-10-31 – 2019-11-04 (×7): 3 mL via RESPIRATORY_TRACT
  Filled 2019-10-31 (×7): qty 3

## 2019-10-31 MED ORDER — LEVALBUTEROL HCL 0.63 MG/3ML IN NEBU
0.6300 mg | INHALATION_SOLUTION | Freq: Four times a day (QID) | RESPIRATORY_TRACT | Status: DC | PRN
  Administered 2019-10-31: 0.63 mg via RESPIRATORY_TRACT

## 2019-10-31 MED ORDER — MAGNESIUM SULFATE 2 GM/50ML IV SOLN
2.0000 g | Freq: Once | INTRAVENOUS | Status: AC
  Administered 2019-10-31: 2 g via INTRAVENOUS
  Filled 2019-10-31: qty 50

## 2019-10-31 MED ORDER — LIDOCAINE HCL 1 % IJ SOLN
INTRAMUSCULAR | Status: AC
  Filled 2019-10-31: qty 20

## 2019-10-31 NOTE — Progress Notes (Signed)
NAME:  Isaac Pierce, MRN:  [id number], DOB:  1961/01/02, LOS: 2 ADMISSION DATE:  10/29/2019, CONSULTATION DATE:  10/29/2019  REFERRING MD:  Dr Francia Greaves of [facility], CHIEF COMPLAINT:  Acute resp failure following Mab infussion   Brief History   59 yo male with hx of CLL/small B cell lymphoma failed CHOP, and started obinutuzumab with cycle 1 on January 22.  Received PRBC transfusion January 28.  On January 29 was started on cycle 2 of CD20 antibody, and then developed stridor with diaphoresis and respiratory distress.  Treated with steroids and epinephrine in ER, and required Bipap.  Past Medical History  GERD, Anxiety, Back pain  Significant Hospital Events   1/29 admit 1/30 transfuse 2 units PRBC  Consults:  Oncology  Procedures:    Significant Diagnostic Tests:  CT chest 1/29 >> decreased size of thoracic LN, increased size of pericardial soft tissue masses and multiple new lung nodules, large b/l effusions, splenomegaly Lt thoracentesis 1/31 >>   Micro Data:  SARS CoV2 PCR 1/29 >> negative Influenza PCR 1/29 >> A and B negative Blood 1/29 >> RVP 1/29 >> negative Urine 1/29 >> negative Pneumococcal Ag 1/29 >> negative Legionella Ag 1/29 >> negative Lt pleural fluid 1/31 >>   Antimicrobials:  Vancomycin 1/29 >> Cefepime 1/29 >>   Interim history/subjective:  More short of breath this morning.  Improved dramatically after thoracentesis at bedside.  Objective   Blood pressure 133/73, pulse (!) 132, temperature 98.2 F (36.8 C), temperature source Oral, resp. rate (!) 31, height 5\' 11"  (1.803 m), weight 71.7 kg, SpO2 94 %.    FiO2 (%):  [40 %] 40 %   Intake/Output Summary (Last 24 hours) at 10/31/2019 1029 Last data filed at 10/31/2019 1000 Gross per 24 hour  Intake 2319.72 ml  Output 4650 ml  Net -2330.28 ml   Filed Weights   10/29/19 1335 10/30/19 0232 10/31/19 0500  Weight: 72.4 kg 71.5 kg 71.7 kg    Examination:  General - alert Eyes - pupils reactive ENT  - no sinus tenderness, no stridor Cardiac - regular rate/rhythm, no murmur, tachycardic Chest - decreased breath sounds b/l Lt > Rt Abdomen - soft, non tender, + bowel sounds Extremities - 2+ edema Skin - no rashes Neuro - normal strength, moves extremities, follows commands Psych - normal mood and behavior  Resolved Hospital Problem list     Assessment & Plan:   Acute hypoxic respiratory failure from allergic reaction to obinutuzumab. Bilateral pleural effusions Lt > Rt. - follow up thoracentesis results from 1/31 - f/u CXR - change solumedrol to 40 mg bid - lasix 40 mg x one on 1/31  Fever. - day 3 of ABx - f/u culture results  Hypokalemia, hypomagnesemia. - replace as needed  CLL/Small B cell lymphoma. - hematology/oncology following  Anemia, thrombocytopenia of chronic disease. - f/u CBC - transfuse for Hb < 7 or significant bleeding  Hx of GERD. - protonix  Steroid induced hyperglycemia. - SSI  Best practice:  Diet: regular diet DVT prophylaxis: scd GI prophylaxis: ppi Mobility: bed rest Code status: full code Disposition: SDU  Labs:   CMP Latest Ref Rng & Units 10/31/2019 10/30/2019 10/29/2019  Glucose 70 - 99 mg/dL 144(H) 136(H) 155(H)  BUN 6 - 20 mg/dL 21(H) 24(H) 21(H)  Creatinine 0.61 - 1.24 mg/dL 0.56(L) 0.49(L) 0.73  Sodium 135 - 145 mmol/L 141 141 138  Potassium 3.5 - 5.1 mmol/L 3.7 3.4(L) 4.3  Chloride 98 - 111 mmol/L 104 105  104  CO2 22 - 32 mmol/L 25 26 23   Calcium 8.9 - 10.3 mg/dL 9.3 8.6(L) 8.5(L)  Total Protein 6.5 - 8.1 g/dL 5.0(L) - 5.0(L)  Total Bilirubin 0.3 - 1.2 mg/dL 0.6 - 1.3(H)  Alkaline Phos 38 - 126 U/L 176(H) - 223(H)  AST 15 - 41 U/L 81(H) - 64(H)  ALT 0 - 44 U/L 85(H) - 68(H)    CBC Latest Ref Rng & Units 10/31/2019 10/30/2019 10/30/2019  WBC 4.0 - 10.5 K/uL 4.9 - 2.1(L)  Hemoglobin 13.0 - 17.0 g/dL 9.9(L) 9.3(L) 5.2(LL)  Hematocrit 39.0 - 52.0 % 30.8(L) 29.1(L) 17.4(L)  Platelets 150 - 400 K/uL 111(L) - 56(L)    ABG     Component Value Date/Time   PHART 7.404 10/29/2019 2140   PCO2ART 42.5 10/29/2019 2140   PO2ART 59.5 (L) 10/29/2019 2140   HCO3 26.0 10/29/2019 2140   ACIDBASEDEF 1.4 10/29/2019 1404   O2SAT 87.4 10/29/2019 2140    CBG (last 3)  Recent Labs    10/30/19 1558 10/30/19 2152 10/31/19 [place], MD [place] Pulmonary/Critical Care 10/31/2019, 10:29 AM

## 2019-10-31 NOTE — Progress Notes (Signed)
[facility] Progress Note Patient Name: Isaac Pierce DOB: September 09, 1961 MRN: [id number]   Date of Service  10/31/2019  HPI/Events of Note  Notified of request for cream for hemorrhoids  eICU Interventions  Preparation H ordered     Intervention Category Minor Interventions: Other:  Elsie Lincoln 10/31/2019, 12:12 AM

## 2019-10-31 NOTE — Progress Notes (Signed)
Bipap not indicated at this time.  Will continue to monitor. 

## 2019-10-31 NOTE — Progress Notes (Signed)
Isaac Pierce   DOB:[birth year]   ES#:[id number]   UQJ#:[id number]  Hem/Onc follow up note   Subjective: Pt underwent left thoracentesis with 2 L fluids removed, he is dyspnea much improved.  He is afebrile, received 1 units of blood last night, he had no bowel movement or other signs of bleeding since yesterday morning.  He feels his pain overall improved.  Objective:  Vitals:   10/31/19 0915 10/31/19 1000  BP:  133/73  Pulse:  (!) 132  Resp:  (!) 31  Temp:    SpO2: 96% 94%    Body mass index is 22.05 kg/m.  Intake/Output Summary (Last 24 hours) at 10/31/2019 1047 Last data filed at 10/31/2019 1000 Gross per 24 hour  Intake 2319.72 ml  Output 4650 ml  Net -2330.28 ml     Sclerae unicteric  Oropharynx clear  No peripheral adenopathy  Lungs clear, decreased breath sound on both lung base   Heart regular rate and rhythm, tachy   Abdomen benign  Neuro nonfocal   CBG (last 3)  Recent Labs    10/30/19 1558 10/30/19 2152 10/31/19 0759  GLUCAP 335* 175* 152*     Labs:  Urine Studies No results for input(s): UHGB, CRYS in the last 72 hours.  Invalid input(s): UACOL, UAPR, USPG, UPH, UTP, UGL, UKET, UBIL, UNIT, UROB, [place], UEPI, UWBC, [place], [place], [place], [place], [place]  Basic Metabolic Panel: Recent Labs  Lab 10/28/19 1233 10/28/19 1233 10/29/19 1404 10/29/19 1404 10/30/19 0125 10/31/19 0500  NA 141  --  138  --  141 141  K 3.3*   < > 4.3   < > 3.4* 3.7  CL 105  --  104  --  105 104  CO2 25  --  23  --  26 25  GLUCOSE 104*  --  155*  --  136* 144*  BUN 19  --  21*  --  24* 21*  CREATININE 0.61  --  0.73  --  0.49* 0.56*  CALCIUM 8.3*  --  8.5*  --  8.6* 9.3  MG  --   --   --   --  1.4* 1.6*  PHOS  --   --   --   --  4.5  --    < > = values in this interval not displayed.   GFR Estimated Creatinine Clearance: 104.6 mL/min (A) (by C-G formula based on SCr of 0.56 mg/dL (L)). Liver Function Tests: Recent Labs  Lab 10/28/19 1233 10/29/19 1404 10/31/19 0500  AST 44*  64* 81*  ALT 57* 68* 85*  ALKPHOS 216* 223* 176*  BILITOT 0.6 1.3* 0.6  PROT 4.7* 5.0* 5.0*  ALBUMIN 2.0* 2.2* 1.9*   No results for input(s): LIPASE, AMYLASE in the last 168 hours. No results for input(s): AMMONIA in the last 168 hours. Coagulation profile Recent Labs  Lab 10/29/19 1404  INR 1.3*    CBC: Recent Labs  Lab 10/28/19 1233 10/28/19 1233 10/29/19 1404 10/29/19 1404 10/29/19 2048 10/30/19 0125 10/30/19 1000 10/30/19 2200 10/31/19 0500  WBC 2.8*  --  3.4*  --  2.2* 2.9* 2.1*  --  4.9  NEUTROABS 2.4  --  3.2  --  1.9  --  1.9  --   --   HGB 7.1*  --  8.0*   < > 7.1* 7.0* 5.2* 9.3* 9.9*  HCT 22.3*   < > 25.3*   < > 23.8* 22.5* 17.4* 29.1* 30.8*  MCV 88.8   < >  93.0  --  94.1 92.2 95.1  --  91.7  PLT 87*  --  79*  --  72* 69* 56*  --  111*   < > = values in this interval not displayed.   Cardiac Enzymes: No results for input(s): CKTOTAL, CKMB, CKMBINDEX, TROPONINI in the last 168 hours. BNP: Invalid input(s): POCBNP CBG: Recent Labs  Lab 10/30/19 1203 10/30/19 1547 10/30/19 1558 10/30/19 2152 10/31/19 0759  GLUCAP 142* 319* 335* 175* 152*   D-Dimer No results for input(s): DDIMER in the last 72 hours. Hgb A1c Recent Labs    10/30/19 1000  HGBA1C 5.7*   Lipid Profile No results for input(s): CHOL, HDL, LDLCALC, TRIG, CHOLHDL, LDLDIRECT in the last 72 hours. Thyroid function studies No results for input(s): TSH, T4TOTAL, T3FREE, THYROIDAB in the last 72 hours.  Invalid input(s): FREET3 Anemia work up No results for input(s): VITAMINB12, FOLATE, FERRITIN, TIBC, IRON, RETICCTPCT in the last 72 hours. Microbiology Recent Results (from the past 240 hour(s))  Respiratory Panel by PCR     Status: None   Collection Time: 10/29/19 11:46 AM   Specimen: Nasopharyngeal Swab; Respiratory  Result Value Ref Range Status   Adenovirus NOT DETECTED NOT DETECTED Final   Coronavirus 229E NOT DETECTED NOT DETECTED Final    Comment: (NOTE) The Coronavirus on  the Respiratory Panel, DOES NOT test for the novel  Coronavirus (2019 nCoV)    Coronavirus HKU1 NOT DETECTED NOT DETECTED Final   Coronavirus NL63 NOT DETECTED NOT DETECTED Final   Coronavirus OC43 NOT DETECTED NOT DETECTED Final   Metapneumovirus NOT DETECTED NOT DETECTED Final   Rhinovirus / Enterovirus NOT DETECTED NOT DETECTED Final   Influenza A NOT DETECTED NOT DETECTED Final   Influenza B NOT DETECTED NOT DETECTED Final   Parainfluenza Virus 1 NOT DETECTED NOT DETECTED Final   Parainfluenza Virus 2 NOT DETECTED NOT DETECTED Final   Parainfluenza Virus 3 NOT DETECTED NOT DETECTED Final   Parainfluenza Virus 4 NOT DETECTED NOT DETECTED Final   Respiratory Syncytial Virus NOT DETECTED NOT DETECTED Final   Bordetella pertussis NOT DETECTED NOT DETECTED Final   Chlamydophila pneumoniae NOT DETECTED NOT DETECTED Final   Mycoplasma pneumoniae NOT DETECTED NOT DETECTED Final    Comment: Performed at [facility], [address], [place], [place] [address]  Culture, blood (routine x 2)     Status: None (Preliminary result)   Collection Time: 10/29/19  2:01 PM   Specimen: BLOOD  Result Value Ref Range Status   Specimen Description   Final    BLOOD PORTA CATH Performed at [facility] [address] [place], [place] [address]    Special Requests   Final    BOTTLES DRAWN AEROBIC AND ANAEROBIC Blood Culture adequate volume Performed at [facility] [address], [place], [place] [address]    Culture   Final    NO GROWTH < 24 HOURS Performed at [facility] [address] [place], [place] [address]    Report Status PENDING  Incomplete  Culture, blood (routine x 2)     Status: None (Preliminary result)   Collection Time: 10/29/19  2:06 PM   Specimen: BLOOD RIGHT HAND  Result Value Ref Range Status   Specimen Description   Final    BLOOD RIGHT HAND Performed at [facility] [address], [place],  [place] [address]    Special Requests   Final    BOTTLES DRAWN  AEROBIC AND ANAEROBIC Blood Culture adequate volume Performed at [facility] [address] [place], [place] [address]    Culture   Final    NO GROWTH < 24 HOURS Performed at [facility] [address], [place], [place] [address]    Report Status PENDING  Incomplete  Respiratory Panel by RT PCR (Flu A&B, Covid) - Nasopharyngeal Swab     Status: None   Collection Time: 10/29/19  2:46 PM   Specimen: Nasopharyngeal Swab  Result Value Ref Range Status   SARS Coronavirus 2 by RT PCR NEGATIVE NEGATIVE Final    Comment: (NOTE) SARS-CoV-2 target nucleic acids are NOT DETECTED. The SARS-CoV-2 RNA is generally detectable in upper respiratoy specimens during the acute phase of infection. The lowest concentration of SARS-CoV-2 viral copies this assay can detect is 131 copies/mL. A negative result does not preclude SARS-Cov-2 infection and should not be used as the sole basis for treatment or other patient management decisions. A negative result may occur with  improper specimen collection/handling, submission of specimen other than nasopharyngeal swab, presence of viral mutation(s) within the areas targeted by this assay, and inadequate number of viral copies (<131 copies/mL). A negative result must be combined with clinical observations, patient history, and epidemiological information. The expected result is Negative. Fact Sheet for Patients:  PinkCheek.be Fact Sheet for Healthcare Providers:  GravelBags.it This test is not yet ap proved or cleared by the [place] FDA and  has been authorized for detection and/or diagnosis of SARS-CoV-2 by FDA under an Emergency Use Authorization (EUA). This EUA will remain  in effect (meaning this test can be used) for the duration of the COVID-19 declaration under Section 564(b)(1) of the Act, 21  U.S.C. section 360bbb-3(b)(1), unless the authorization is terminated or revoked sooner.    Influenza A by PCR NEGATIVE NEGATIVE Final   Influenza B by PCR NEGATIVE NEGATIVE Final    Comment: (NOTE) The Xpert Xpress SARS-CoV-2/FLU/RSV assay is intended as an aid in  the diagnosis of influenza from Nasopharyngeal swab specimens and  should not be used as a sole basis for treatment. Nasal washings and  aspirates are unacceptable for Xpert Xpress SARS-CoV-2/FLU/RSV  testing. Fact Sheet for Patients: PinkCheek.be Fact Sheet for Healthcare Providers: GravelBags.it This test is not yet approved or cleared by the [place] FDA and  has been authorized for detection and/or diagnosis of SARS-CoV-2 by  FDA under an Emergency Use Authorization (EUA). This EUA will remain  in effect (meaning this test can be used) for the duration of the  Covid-19 declaration under Section 564(b)(1) of the Act, 21  U.S.C. section 360bbb-3(b)(1), unless the authorization is  terminated or revoked. Performed at [facility], [place] [address], [place], [place] [address]   MRSA PCR Screening     Status: None   Collection Time: 10/29/19  8:39 PM   Specimen: Nasopharyngeal  Result Value Ref Range Status   MRSA by PCR NEGATIVE NEGATIVE Final    Comment:        The GeneXpert MRSA Assay (FDA approved for NASAL specimens only), is one component of a comprehensive MRSA colonization surveillance program. It is not intended to diagnose MRSA infection nor to guide or monitor treatment for MRSA infections. Performed at [facility], [place] [address] [place], [place] [address]   Urine culture     Status: None   Collection Time: 10/29/19  8:41 PM   Specimen: Urine, Random  Result Value  Ref Range Status   Specimen Description   Final    URINE, RANDOM Performed at [facility] [address],  [place], [place] [address]    Special Requests   Final    NONE Performed at [facility], [place], [place] [address] [place], [place] [address]    Culture   Final    NO GROWTH Performed at [facility], [place] [address] [place], [place] [address]    Report Status 10/31/2019 FINAL  Final  Culture, blood (routine x 2)     Status: None (Preliminary result)   Collection Time: 10/29/19  8:48 PM   Specimen: BLOOD  Result Value Ref Range Status   Specimen Description   Final    BLOOD RIGHT ARM Performed at [facility] [address] [place], [place] [address]    Special Requests   Final    BOTTLES DRAWN AEROBIC ONLY Blood Culture adequate volume Performed at [facility] [address] [place], [place] [address]    Culture   Final    NO GROWTH < 24 HOURS Performed at [facility] [address] [place], [place] [address]    Report Status PENDING  Incomplete  Culture, blood (routine x 2)     Status: None (Preliminary result)   Collection Time: 10/29/19  8:51 PM   Specimen: BLOOD RIGHT HAND  Result Value Ref Range Status   Specimen Description   Final    BLOOD RIGHT HAND Performed at [facility] [address] [place], [place] [address]    Special Requests   Final    BOTTLES DRAWN AEROBIC ONLY Blood Culture adequate volume Performed at [facility] [address], [place], [place] [address]    Culture   Final    NO GROWTH < 24 HOURS Performed at [facility] [address], [place], [place] [address]    Report Status PENDING  Incomplete      Studies:  CT CHEST WO CONTRAST  Result Date: 10/29/2019 CLINICAL DATA:  With lymphoma, abnormal radiograph EXAM: CT CHEST WITHOUT CONTRAST TECHNIQUE: Multidetector CT imaging of the chest was performed following the standard protocol without IV contrast. COMPARISON:  Radiograph 10/28/2018, CT 08/25/2019 FINDINGS: Cardiovascular:  Left IJ approach Port-A-Cath is accessed with the tip positioned at the right atrium. Normal cardiac size. Redemonstration of the extensive pericardial soft tissue attenuation lesions these appear to extend more superiorly towards the cardiac base than on comparison study including a larger crescentic amount of soft tissue measuring approximately 6.9 cm anteroposterior and 1.7 cm in thickness near the level of the pulmonary trunk (2/79). Central pulmonary arteries are normal caliber. Atherosclerotic plaque within the normal caliber aorta. Normal 3 vessel branching of the aortic arch with minimal plaque in the great vessels. Mediastinum/Nodes: Decrease in the size of bulky thoracic lymphadenopathy compatible with patient's known lymphoproliferative disorder. Index lymph nodes include: *A high right paratracheal node measuring 12 mm short axis (2/45), previously 23 mm *AP window lymph node measuring 24 mm (4/35), previously 29 mm *Right hilar node measuring 15 mm (2/85), previously 21 mm *Subcarinal node measuring 18 mm (2/69), previously 26 mm *Right inter lobar lymph node on comparison is difficult to assess given absence of contrast media. Thyroid gland is unremarkable. No acute abnormality of the trachea or esophagus. Lungs/Pleura: Large bilateral pleural effusions are noted. Dense bandlike areas of atelectasis are noted in both lungs. Multiple nodules are seen  in the right middle and lower lobe examples include a 8 mm nodule laterally (4/102) in lower lobe and a subpleural 6 mm nodule anteriorly in the right middle lobe (4/98). No visible nodules seen in the left lung though underlying atelectasis could obscure the presence of nodules or masses. Upper Abdomen: Marked splenomegaly. Some wedge-shaped hypoattenuation in the periphery of the spleen measuring 1.6 x 3.2 cm, compatible with sequela of remote splenic infarct. Indeterminate exophytic 12 mm lesion arising from the upper pole right kidney. Musculoskeletal:  Multilevel degenerative changes are present in the imaged portions of the spine. Evaluation of the chest wall limited given extensive motion artifact which may limit detection of sternal and rib fractures. No gross acute osseous abnormality or suspicious osseous lesions. IMPRESSION: 1. Mixed response of patient's lymphoproliferative disease with overall decrease in the size of bulky thoracic lymphadenopathy but with increasing pericardial soft tissue masses and multiple new lung nodules. 2. Large bilateral pleural effusions with associated compressive atelectasis. 3. 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 4. Indeterminate 12 mm lesion arising from the upper pole of the right kidney. Indeterminate on noncontrast CT. Could consider follow-up with nonemergent outpatient MR or CT. 5. Evaluation of the chest wall limited given extensive motion artifact which may limit detection of sternal and rib fractures. No gross acute osseous abnormality. 6.  Aortic Atherosclerosis (ICD10-I70.0). Electronically Signed   By: Lovena Le M.D.   On: 10/29/2019 20:36   DG Chest Port 1 View  Result Date: 10/31/2019 CLINICAL DATA:  Status post left thoracentesis. EXAM: PORTABLE CHEST 1 VIEW COMPARISON:  October 31, 2019 FINDINGS: The left-sided pleural effusion is smaller after thoracentesis. No pneumothorax. Bilateral pleural effusions remain. The right Port-A-Cath is stable. No other interval changes. IMPRESSION: The left-sided pleural effusion is smaller after thoracentesis. No pneumothorax. No other changes. Electronically Signed   By: Dorise Bullion III M.D   On: 10/31/2019 10:29   DG Chest Port 1 View  Result Date: 10/31/2019 CLINICAL DATA:  Follow-up pleural effusion EXAM: PORTABLE CHEST 1 VIEW COMPARISON:  10/30/2019 FINDINGS: Power port unchanged. Bilateral effusions, larger on the left than the right. Volume loss in the lower lungs, worse on the left than the right. No new radiographic finding.  IMPRESSION: No change. Large bilateral effusions left more than right with dependent volume loss left more than right. Electronically Signed   By: Nelson Chimes M.D.   On: 10/31/2019 06:00   DG Chest Port 1 View  Result Date: 10/30/2019 CLINICAL DATA:  Respiratory failure. EXAM: PORTABLE CHEST 1 VIEW COMPARISON:  Chest radiograph 10/29/2019 FINDINGS: Right anterior chest wall Port-A-Cath is present with tip projecting over the superior vena cava. Monitoring leads overlie the patient. Stable enlarged cardiac and mediastinal contours. Persistent moderate to large layering bilateral pleural effusions with underlying pulmonary consolidation. No pneumothorax. IMPRESSION: Persistent moderate to large layering bilateral effusions with underlying consolidation. Electronically Signed   By: Lovey Newcomer M.D.   On: 10/30/2019 04:58   DG Chest Port 1 View  Result Date: 10/29/2019 CLINICAL DATA:  Shortness of breath EXAM: PORTABLE CHEST 1 VIEW COMPARISON:  09/01/2019, CT 08/25/2019 FINDINGS: Right-sided central venous port tip over the cavoatrial region allowing for rotated patient. Suspected moderate layering right pleural effusion, probably increased from prior radiograph. Moderate left pleural effusion, also suspect increase in size. Enlarged cardiomediastinal silhouette with vascular congestion. Consolidation at the left lung base. Fluid in the right fissure. Hazy appearance of both thoraces likely due to layering fluid although underlying pulmonary  edema could be contributing. No pneumothorax. IMPRESSION: Hazy appearance of the bilateral lungs since 09/01/2019, suspected to be secondary to moderate layering pleural effusions, probably increased in the interim. Cardiomegaly with vascular congestion; hazy lung appearance could also be due to associated edema. Atelectasis or pneumonia at the left base. Electronically Signed   By: Donavan Foil M.D.   On: 10/29/2019 15:19    Assessment: 59 y.o. with recently diagnosed  stage IV CLL/SLL, on chemo, admitted for dyspnea and respiratory failure   1.  Acute hypoxic respite failure from allergic reaction to Obinutuzumab and bilateral pleural effusion, improved  2. Fever, on broad antibiotics  3. Stage IV SLL, on chemo  4. Pancytopenia from bone marrorw involvement of SLL and chemo    Plan: -Status post left thoracentesis with 2 L fluids removed this morning, dyspnea improved, but he is still has residual dyspnea and tachypnea, and may need repeated thoracentesis in near future -lab reviewed, not sure why her hemoglobin dropped to 5.2 yesterday late morning,, no overt bleeding, he received 2u blood, Hg improved to 9.9. I will check ret count and LDH, to rule out hemolysis, orders placed and I called lab to add them on -his thrombocytopenia has improved, -Dr. Julien Nordmann will f/u tomorrow     Truitt Merle, MD 10/31/2019  10:47 AM

## 2019-10-31 NOTE — Procedures (Signed)
Thoracentesis Procedure Note  Pre-operative Diagnosis: Recurrent Left pleural effusion   Post-operative Diagnosis: same  Indications: 59 yo male with history of lymphoma with recurrent left pleural effusion.  Procedure Details  Consent: Informed consent was obtained. Risks of the procedure were discussed including: infection, bleeding, pain, pneumothorax.  Under sterile conditions the patient was positioned. Betadine solution and sterile drapes were utilized.  2% plain plain was used to anesthetize the 7 rib space on the left. Fluid was obtained without any difficulties and minimal blood loss.  A dressing was applied to the wound and wound care instructions were provided.   Findings 2000 ml of dark yellow to tan pleural fluid was obtained.  Complications:  None; patient tolerated the procedure well.          Condition: stable  Plan A follow up chest x-ray was ordered. Bed Rest for 2 hours. Sending fluid for glucose, protein, LDH, cell count with differential, culture, and cytology  Attending Attestation: I performed the procedure.  Chesley Mires, MD [facility] Pulmonary/Critical Care 10/31/2019, 10:07 AM

## 2019-10-31 NOTE — Progress Notes (Signed)
Patient woke from nap feeling returned short of breath, heart rate sustaining 130-140's. Patient asking for breathing treatment, PRN tylenol and PRN vistril. Respiratory notified, changed treatment to accommodate for increased HR. MD made aware of work of breathing and increased heart rate. Will continue to monitor.

## 2019-10-31 NOTE — Progress Notes (Signed)
Pt A&O x4 and refusing bi-pap at this time. WOB noted to be labored with dyspnea at rest. HR 130. SATs 95% on 4L [place]. RN aware, RT will continue to monitor.

## 2019-11-01 ENCOUNTER — Other Ambulatory Visit: Payer: Self-pay

## 2019-11-01 ENCOUNTER — Inpatient Hospital Stay (HOSPITAL_COMMUNITY): Payer: Medicaid Other

## 2019-11-01 LAB — BASIC METABOLIC PANEL
Anion gap: 11 (ref 5–15)
BUN: 24 mg/dL — ABNORMAL HIGH (ref 6–20)
CO2: 25 mmol/L (ref 22–32)
Calcium: 9.1 mg/dL (ref 8.9–10.3)
Chloride: 103 mmol/L (ref 98–111)
Creatinine, Ser: 0.46 mg/dL — ABNORMAL LOW (ref 0.61–1.24)
GFR calc Af Amer: >60 mL/min (ref >60)
GFR calc non Af Amer: >60 mL/min (ref >60)
Glucose, Bld: 124 mg/dL — ABNORMAL HIGH (ref 70–99)
Potassium: 3.9 mmol/L (ref 3.5–5.1)
Sodium: 139 mmol/L (ref 135–145)

## 2019-11-01 LAB — TYPE AND SCREEN
ABO/RH(D): O POS
Antibody Screen: NEGATIVE
Unit division: 0
Unit division: 0

## 2019-11-01 LAB — BPAM RBC
Blood Product Expiration Date: 202102272359
Blood Product Expiration Date: 202102272359
ISSUE DATE / TIME: 202101301522
ISSUE DATE / TIME: 202101301745
Unit Type and Rh: 5100
Unit Type and Rh: 5100

## 2019-11-01 LAB — GLUCOSE, CAPILLARY
Glucose-Capillary: 101 mg/dL — ABNORMAL HIGH (ref 70–99)
Glucose-Capillary: 238 mg/dL — ABNORMAL HIGH (ref 70–99)
Glucose-Capillary: 90 mg/dL (ref 70–99)
Glucose-Capillary: 109 mg/dL — ABNORMAL HIGH (ref 70–99)

## 2019-11-01 LAB — CBC
HCT: 29.9 % — ABNORMAL LOW (ref 39.0–52.0)
Hemoglobin: 9.6 g/dL — ABNORMAL LOW (ref 13.0–17.0)
MCH: 30 pg (ref 26.0–34.0)
MCHC: 32.1 g/dL (ref 30.0–36.0)
MCV: 93.4 fL (ref 80.0–100.0)
Platelets: 93 10*3/uL — ABNORMAL LOW (ref 150–400)
RBC: 3.2 MIL/uL — ABNORMAL LOW (ref 4.22–5.81)
RDW: 17 % — ABNORMAL HIGH (ref 11.5–15.5)
WBC: 4 10*3/uL (ref 4.0–10.5)
nRBC: 0 % (ref 0.0–0.2)

## 2019-11-01 LAB — MAGNESIUM: Magnesium: 1.6 mg/dL — ABNORMAL LOW (ref 1.7–2.4)

## 2019-11-01 MED ORDER — MAGNESIUM SULFATE 2 GM/50ML IV SOLN
2.0000 g | Freq: Once | INTRAVENOUS | Status: AC
  Administered 2019-11-01: 2 g via INTRAVENOUS
  Filled 2019-11-01: qty 50

## 2019-11-01 MED ORDER — LEVALBUTEROL HCL 1.25 MG/0.5ML IN NEBU
1.2500 mg | INHALATION_SOLUTION | Freq: Three times a day (TID) | RESPIRATORY_TRACT | Status: DC
  Administered 2019-11-01 – 2019-11-04 (×8): 1.25 mg via RESPIRATORY_TRACT
  Filled 2019-11-01 (×9): qty 0.5

## 2019-11-01 MED ORDER — SENNA 8.6 MG PO TABS
1.0000 | ORAL_TABLET | Freq: Every day | ORAL | Status: DC | PRN
  Administered 2019-11-01 – 2019-11-08 (×3): 8.6 mg via ORAL
  Filled 2019-11-01 (×3): qty 1

## 2019-11-01 MED ORDER — PREDNISONE 10 MG PO TABS
10.0000 mg | ORAL_TABLET | Freq: Two times a day (BID) | ORAL | Status: DC
  Administered 2019-11-01 – 2019-11-02 (×3): 10 mg via ORAL
  Filled 2019-11-01 (×3): qty 1

## 2019-11-01 MED ORDER — NICOTINE 14 MG/24HR TD PT24
14.0000 mg | MEDICATED_PATCH | Freq: Every day | TRANSDERMAL | Status: DC
  Administered 2019-11-01 – 2019-11-12 (×12): 14 mg via TRANSDERMAL
  Filled 2019-11-01 (×15): qty 1

## 2019-11-01 MED ORDER — IPRATROPIUM BROMIDE 0.02 % IN SOLN
0.5000 mg | Freq: Three times a day (TID) | RESPIRATORY_TRACT | Status: DC
  Administered 2019-11-01 – 2019-11-04 (×8): 0.5 mg via RESPIRATORY_TRACT
  Filled 2019-11-01 (×9): qty 2.5

## 2019-11-01 NOTE — Progress Notes (Signed)
.  [facility] ADULT ICU REPLACEMENT PROTOCOL FOR AM LAB REPLACEMENT ONLY  The patient does apply for the [facility] Adult ICU Electrolyte Replacment Protocol based on the criteria listed below:   1. Is GFR >/= 40 ml/min? Yes.    Patient's GFR today is >60 2. Is urine output >/= 0.5 ml/kg/hr for the last 6 hours? Yes.   Patient's UOP is 1.58 ml/kg/hr 3. Is BUN < 60 mg/dL? Yes.    Patient's BUN today is 24 4. Abnormal electrolyte(s): Mag 1.6 5. Ordered repletion with: protocol 6. If a panic level lab has been reported, has the CCM MD in charge been notified? Yes.  .   Physician:  Dr.Kamat  Carlisle Beers 11/01/2019 6:50 AM

## 2019-11-01 NOTE — Progress Notes (Signed)
HEMATOLOGY-ONCOLOGY PROGRESS NOTE  SUBJECTIVE: Reports that he is feeling better this morning.  Breathing has improved following thoracentesis.  He had some discomfort in his chest overnight which he thinks was related to the prior thoracentesis.  Pain is now resolved.  States abdomen is less distended.  He has no other complaints this morning.  Oncology History  Non-Hodgkin lymphoma ([place])  08/25/2019 Initial Diagnosis   Non-Hodgkin lymphoma ([place])   08/25/2019 - 08/31/2019 Chemotherapy   The patient had riTUXimab (RITUXAN) 700 mg in sodium chloride 0.9 % 250 mL (2.1875 mg/mL) infusion, 375 mg/m2 = 700 mg (100 % of original dose 375 mg/m2), Intravenous,  Once, 1 of 1 cycle Dose modification: 375 mg/m2 (original dose 375 mg/m2, Cycle 1) Administration: 700 mg (08/25/2019) riTUXimab-pvvr (RUXIENCE) 700 mg in sodium chloride 0.9 % 250 mL (2.1875 mg/mL) infusion, 375 mg/m2 = 700 mg (100 % of original dose 375 mg/m2), Intravenous,  Once, 0 of 3 cycles Dose modification: 375 mg/m2 (original dose 375 mg/m2, Cycle 2), 375 mg/m2 (original dose 375 mg/m2, Cycle 3)  for chemotherapy treatment.    09/01/2019 - 10/12/2019 Chemotherapy   The patient had DOXOrubicin (ADRIAMYCIN) chemo injection 68 mg, 37.5 mg/m2 = 68 mg (75 % of original dose 50 mg/m2), Intravenous,  Once, 2 of 6 cycles Dose modification: 37.5 mg/m2 (75 % of original dose 50 mg/m2, Cycle 1, Reason: Provider Judgment) Administration: 68 mg (09/01/2019), 68 mg (09/22/2019) palonosetron (ALOXI) injection 0.25 mg, 0.25 mg, Intravenous,  Once, 2 of 6 cycles Administration: 0.25 mg (09/01/2019), 0.25 mg (09/22/2019) pegfilgrastim-cbqv (UDENYCA) injection 6 mg, 6 mg, Subcutaneous, Once, 1 of 5 cycles Administration: 6 mg (09/23/2019) vinCRIStine (ONCOVIN) 1.5 mg in sodium chloride 0.9 % 50 mL chemo infusion, 1.5 mg (75 % of original dose 2 mg), Intravenous,  Once, 2 of 6 cycles Dose modification: 1.5 mg (75 % of original dose 2 mg, Cycle 1, Reason:  Provider Judgment) Administration: 1.5 mg (09/01/2019), 1.5 mg (09/22/2019) riTUXimab (RITUXAN) 700 mg in sodium chloride 0.9 % 250 mL (2.1875 mg/mL) infusion, 375 mg/m2 = 700 mg (100 % of original dose 375 mg/m2), Intravenous,  Once, 1 of 1 cycle Dose modification: 375 mg/m2 (original dose 375 mg/m2, Cycle 1) Administration: 700 mg (09/01/2019) cyclophosphamide (CYTOXAN) 1,020 mg in sodium chloride 0.9 % 250 mL chemo infusion, 562.5 mg/m2 = 1,020 mg (75 % of original dose 750 mg/m2), Intravenous,  Once, 2 of 6 cycles Dose modification: 562.5 mg/m2 (75 % of original dose 750 mg/m2, Cycle 1, Reason: Provider Judgment) Administration: 1,020 mg (09/01/2019), 1,020 mg (09/22/2019) fosaprepitant (EMEND) 150 mg, dexamethasone (DECADRON) 12 mg in sodium chloride 0.9 % 145 mL IVPB, , Intravenous,  Once, 2 of 6 cycles Administration:  (09/01/2019),  (09/22/2019) riTUXimab-pvvr (RUXIENCE) 700 mg in sodium chloride 0.9 % 250 mL (2.1875 mg/mL) infusion, 375 mg/m2 = 700 mg, Intravenous,  Once, 1 of 1 cycle  for chemotherapy treatment.    10/22/2019 -  Chemotherapy   The patient had obinutuzumab (GAZYVA) 100 mg in sodium chloride 0.9 % 100 mL (0.9615 mg/mL) chemo infusion, 100 mg, Intravenous, Once, 1 of 6 cycles Administration: 100 mg (10/22/2019), 1,000 mg (10/29/2019)  for chemotherapy treatment.       REVIEW OF SYSTEMS:   Noncontributory except as noted in the HPI.  PHYSICAL EXAMINATION: ECOG PERFORMANCE STATUS: 1 - Symptomatic but completely ambulatory  Vitals:   11/01/19 1000 11/01/19 1100  BP: 126/64 (!) 142/69  Pulse: (!) 133 (!) 132  Resp: 18 18  Temp:  SpO2: 96% 95%   Filed Weights   10/30/19 0232 10/31/19 0500 11/01/19 0500  Weight: 157 lb 10.1 oz (71.5 kg) 158 lb 1.1 oz (71.7 kg) 162 lb 7.7 oz (73.7 kg)    Intake/Output from previous day: 01/31 0701 - 02/01 0700 In: 1417.5 [P.O.:1080; I.V.:37.5; IV Piggyback:300] Out: 3200 [Urine:1600]  GENERAL:alert, no distress, appears short  of breath at times when talking LYMPH: Persistent left axillary lymphadenopathy LUNGS: Diminished breath sounds to the left base, otherwise clear HEART: Tachycardic, 2+ bilateral lower extremity edema ABDOMEN: Positive bowel sounds, soft, nontender Musculoskeletal:no cyanosis of digits and no clubbing  NEURO: alert & oriented x 3 with fluent speech, no focal motor/sensory deficits  LABORATORY DATA:  I have reviewed the data as listed CMP Latest Ref Rng & Units 11/01/2019 10/31/2019 10/30/2019  Glucose 70 - 99 mg/dL 124(H) 144(H) 136(H)  BUN 6 - 20 mg/dL 24(H) 21(H) 24(H)  Creatinine 0.61 - 1.24 mg/dL 0.46(L) 0.56(L) 0.49(L)  Sodium 135 - 145 mmol/L 139 141 141  Potassium 3.5 - 5.1 mmol/L 3.9 3.7 3.4(L)  Chloride 98 - 111 mmol/L 103 104 105  CO2 22 - 32 mmol/L 25 25 26   Calcium 8.9 - 10.3 mg/dL 9.1 9.3 8.6(L)  Total Protein 6.5 - 8.1 g/dL - 5.0(L) -  Total Bilirubin 0.3 - 1.2 mg/dL - 0.6 -  Alkaline Phos 38 - 126 U/L - 176(H) -  AST 15 - 41 U/L - 81(H) -  ALT 0 - 44 U/L - 85(H) -    Lab Results  Component Value Date   WBC 4.0 11/01/2019   HGB 9.6 (L) 11/01/2019   HCT 29.9 (L) 11/01/2019   MCV 93.4 11/01/2019   PLT 93 (L) 11/01/2019   NEUTROABS 1.9 10/30/2019    CT CHEST WO CONTRAST  Result Date: 10/29/2019 CLINICAL DATA:  With lymphoma, abnormal radiograph EXAM: CT CHEST WITHOUT CONTRAST TECHNIQUE: Multidetector CT imaging of the chest was performed following the standard protocol without IV contrast. COMPARISON:  Radiograph 10/28/2018, CT 08/25/2019 FINDINGS: Cardiovascular: Left IJ approach Port-A-Cath is accessed with the tip positioned at the right atrium. Normal cardiac size. Redemonstration of the extensive pericardial soft tissue attenuation lesions these appear to extend more superiorly towards the cardiac base than on comparison study including a larger crescentic amount of soft tissue measuring approximately 6.9 cm anteroposterior and 1.7 cm in thickness near the level of  the pulmonary trunk (2/79). Central pulmonary arteries are normal caliber. Atherosclerotic plaque within the normal caliber aorta. Normal 3 vessel branching of the aortic arch with minimal plaque in the great vessels. Mediastinum/Nodes: Decrease in the size of bulky thoracic lymphadenopathy compatible with patient's known lymphoproliferative disorder. Index lymph nodes include: *A high right paratracheal node measuring 12 mm short axis (2/45), previously 23 mm *AP window lymph node measuring 24 mm (4/35), previously 29 mm *Right hilar node measuring 15 mm (2/85), previously 21 mm *Subcarinal node measuring 18 mm (2/69), previously 26 mm *Right inter lobar lymph node on comparison is difficult to assess given absence of contrast media. Thyroid gland is unremarkable. No acute abnormality of the trachea or esophagus. Lungs/Pleura: Large bilateral pleural effusions are noted. Dense bandlike areas of atelectasis are noted in both lungs. Multiple nodules are seen in the right middle and lower lobe examples include a 8 mm nodule laterally (4/102) in lower lobe and a subpleural 6 mm nodule anteriorly in the right middle lobe (4/98). No visible nodules seen in the left lung though underlying atelectasis could obscure the presence  of nodules or masses. Upper Abdomen: Marked splenomegaly. Some wedge-shaped hypoattenuation in the periphery of the spleen measuring 1.6 x 3.2 cm, compatible with sequela of remote splenic infarct. Indeterminate exophytic 12 mm lesion arising from the upper pole right kidney. Musculoskeletal: Multilevel degenerative changes are present in the imaged portions of the spine. Evaluation of the chest wall limited given extensive motion artifact which may limit detection of sternal and rib fractures. No gross acute osseous abnormality or suspicious osseous lesions. IMPRESSION: 1. Mixed response of patient's lymphoproliferative disease with overall decrease in the size of bulky thoracic lymphadenopathy but  with increasing pericardial soft tissue masses and multiple new lung nodules. 2. Large bilateral pleural effusions with associated compressive atelectasis. 3. 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 4. Indeterminate 12 mm lesion arising from the upper pole of the right kidney. Indeterminate on noncontrast CT. Could consider follow-up with nonemergent outpatient MR or CT. 5. Evaluation of the chest wall limited given extensive motion artifact which may limit detection of sternal and rib fractures. No gross acute osseous abnormality. 6.  Aortic Atherosclerosis (ICD10-I70.0). Electronically Signed   By: Lovena Le M.D.   On: 10/29/2019 20:36   DG CHEST PORT 1 VIEW  Result Date: 11/01/2019 CLINICAL DATA:  Status post thoracentesis EXAM: PORTABLE CHEST 1 VIEW COMPARISON:  10/31/19 FINDINGS: Cardiac shadow is stable. Right chest wall port is noted. Small pleural effusions are noted bilaterally with basilar atelectasis. No pneumothorax is seen. IMPRESSION: Small pleural effusions bilaterally with basilar atelectasis. No pneumothorax is identified. Electronically Signed   By: Inez Catalina M.D.   On: 11/01/2019 01:48   DG Chest Port 1 View  Result Date: 10/31/2019 CLINICAL DATA:  Status post left thoracentesis. EXAM: PORTABLE CHEST 1 VIEW COMPARISON:  October 31, 2019 FINDINGS: The left-sided pleural effusion is smaller after thoracentesis. No pneumothorax. Bilateral pleural effusions remain. The right Port-A-Cath is stable. No other interval changes. IMPRESSION: The left-sided pleural effusion is smaller after thoracentesis. No pneumothorax. No other changes. Electronically Signed   By: Dorise Bullion III M.D   On: 10/31/2019 10:29   DG Chest Port 1 View  Result Date: 10/31/2019 CLINICAL DATA:  Follow-up pleural effusion EXAM: PORTABLE CHEST 1 VIEW COMPARISON:  10/30/2019 FINDINGS: Power port unchanged. Bilateral effusions, larger on the left than the right. Volume loss in the lower lungs,  worse on the left than the right. No new radiographic finding. IMPRESSION: No change. Large bilateral effusions left more than right with dependent volume loss left more than right. Electronically Signed   By: Nelson Chimes M.D.   On: 10/31/2019 06:00   DG Chest Port 1 View  Result Date: 10/30/2019 CLINICAL DATA:  Respiratory failure. EXAM: PORTABLE CHEST 1 VIEW COMPARISON:  Chest radiograph 10/29/2019 FINDINGS: Right anterior chest wall Port-A-Cath is present with tip projecting over the superior vena cava. Monitoring leads overlie the patient. Stable enlarged cardiac and mediastinal contours. Persistent moderate to large layering bilateral pleural effusions with underlying pulmonary consolidation. No pneumothorax. IMPRESSION: Persistent moderate to large layering bilateral effusions with underlying consolidation. Electronically Signed   By: Lovey Newcomer M.D.   On: 10/30/2019 04:58   DG Chest Port 1 View  Result Date: 10/29/2019 CLINICAL DATA:  Shortness of breath EXAM: PORTABLE CHEST 1 VIEW COMPARISON:  09/01/2019, CT 08/25/2019 FINDINGS: Right-sided central venous port tip over the cavoatrial region allowing for rotated patient. Suspected moderate layering right pleural effusion, probably increased from prior radiograph. Moderate left pleural effusion, also suspect increase in size. Enlarged  cardiomediastinal silhouette with vascular congestion. Consolidation at the left lung base. Fluid in the right fissure. Hazy appearance of both thoraces likely due to layering fluid although underlying pulmonary edema could be contributing. No pneumothorax. IMPRESSION: Hazy appearance of the bilateral lungs since 09/01/2019, suspected to be secondary to moderate layering pleural effusions, probably increased in the interim. Cardiomegaly with vascular congestion; hazy lung appearance could also be due to associated edema. Atelectasis or pneumonia at the left base. Electronically Signed   By: Donavan Foil M.D.   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  He had bulky lymphadenopathy in the chest and abdomen as well as hepatosplenomegaly and involvement of the bone marrow.  He also had significant pancytopenia on presentation.  He was initially tried on single agent Rituxan but this was discontinued secondary to hypersensitivity reaction.  He received 2 cycles of CHOP, but did not respond well.  His chemotherapy was switched to Obinutuzumab and acalabrutinib.  He had an infusion reaction to obinutuzumab and is now hospitalized.  Status post thoracentesis and cytology is pending.  Breathing is now improving.  Remains on steroids.  Appreciate assistance from PCCM.  Discussed with the patient that we will have further discussions regarding treatment for his small lymphocytic lymphoma/CLL as an outpatient.   LOS: 3 days   Mikey Bussing, DNP, AGPCNP-BC, AOCNP 11/01/19

## 2019-11-01 NOTE — Progress Notes (Signed)
Pt stated that he felt short of breath while eating breakfast and requested a breathing treatment and a PRN vistaril. Respiratory called to bedside for PRN breathing treatment. Vistaril given.   HR remains in the 120s-130s, MD Olalere made aware. No new orders at this time.   Pt states improvement after breathing treatment and is resting comfortably in bed. Will continue to monitor closely.

## 2019-11-01 NOTE — Progress Notes (Signed)
Pt in no noted distress at this time, BIPAP not indicated.  RT to monitor and assess as needed.

## 2019-11-01 NOTE — Progress Notes (Signed)
NAME:  Isaac Pierce, MRN:  [id number], DOB:  06-30-1961, LOS: 3 ADMISSION DATE:  10/29/2019, CONSULTATION DATE:  10/29/2019  REFERRING MD:  Dr Francia Greaves of [facility], CHIEF COMPLAINT:  Acute resp failure following Mab infussion   Brief History   59 yo male with hx of CLL/small B cell lymphoma failed CHOP, and started obinutuzumab with cycle 1 on January 22.  Received PRBC transfusion January 28.  On January 29 was started on cycle 2 of CD20 antibody, and then developed stridor with diaphoresis and respiratory distress.  Treated with steroids and epinephrine in ER, and required Bipap. Had thoracentesis with 2 L on 10/31/19  Past Medical History  GERD, Anxiety, Back pain  Significant Hospital Events   1/29 admit 1/30 transfuse 2 units PRBC 1/31 thoracentesis Consults:  Oncology  Procedures:  Thoracentesis 1/31-2 L of serous fluid  Significant Diagnostic Tests:  CT chest 1/29 >> decreased size of thoracic LN, increased size of pericardial soft tissue masses and multiple new lung nodules, large b/l effusions, splenomegaly Lt thoracentesis 1/31 >>   Micro Data:  SARS CoV2 PCR 1/29 >> negative Influenza PCR 1/29 >> A and B negative Blood 1/29 >> RVP 1/29 >> negative Urine 1/29 >> negative Pneumococcal Ag 1/29 >> negative Legionella Ag 1/29 >> negative Lt pleural fluid 1/31 >>   Antimicrobials:  Vancomycin 1/29  Cefepime 1/29 >>   Interim history/subjective:  Feels a lot better Still has some shortness of breath but much improved  Objective   Blood pressure (!) 134/91, pulse (!) 121, temperature 97.9 F (36.6 C), temperature source Oral, resp. rate 18, height 5\' 11"  (1.803 m), weight 73.7 kg, SpO2 95 %.        Intake/Output Summary (Last 24 hours) at 11/01/2019 0851 Last data filed at 11/01/2019 [id number] Gross per 24 hour  Intake 1410 ml  Output 3200 ml  Net -1790 ml   Filed Weights   10/30/19 0232 10/31/19 0500 11/01/19 0500  Weight: 71.5 kg 71.7 kg 73.7 kg     Examination:  General -alert and oriented x3 HEENT-pupils reactive, moist oral mucosa Cardiac -S1-S2 appreciated, no murmur, tachycardic Chest -decreased air entry at the bases bilaterally Abdomen -bowel sounds appreciated, soft nontender Extremities -2+ edema bilaterally Skin -warm and dry Neuro -moving all extremities, nonfocal Psych - normal mood and behavior  Chest x-ray 2/1 reviewed by myself showing small pleural effusions bilaterally  Resolved Hospital Problem list     Assessment & Plan:   Acute hypoxic respiratory failure from allergic reaction to obinutuzumab Bilateral pleural effusion left greater than right Post thoracentesis on 1/31 for 2 L of serous fluid -Chest x-ray shows improvement -Switch from Solu-Medrol to prednisone-10 p.o. twice daily -Pleural fluid is exudative, no organism  Fever -Resolving -Day 4 of antibiotics -We will stop antibiotics 2/2 if cultures remain negative and white count continues to improve -Culture negative so far  Electrolyte derangement including hypokalemia, hypomagnesemia -Repleted  CLL/small B-cell lymphoma Heme-onc following  Anemia, thrombocytopenia chronic disease -Monitor -Transfuse per protocol  History of GERD -Protonix  Steroid-induced hyperglycemia -SSI  Best practice:  Diet: regular diet DVT prophylaxis: scd GI prophylaxis: ppi Mobility: bed rest Code status: full code Disposition: step down unit  Labs:   CMP Latest Ref Rng & Units 11/01/2019 10/31/2019 10/30/2019  Glucose 70 - 99 mg/dL 124(H) 144(H) 136(H)  BUN 6 - 20 mg/dL 24(H) 21(H) 24(H)  Creatinine 0.61 - 1.24 mg/dL 0.46(L) 0.56(L) 0.49(L)  Sodium 135 - 145 mmol/L 139 141 141  Potassium 3.5 - 5.1 mmol/L 3.9 3.7 3.4(L)  Chloride 98 - 111 mmol/L 103 104 105  CO2 22 - 32 mmol/L 25 25 26   Calcium 8.9 - 10.3 mg/dL 9.1 9.3 8.6(L)  Total Protein 6.5 - 8.1 g/dL - 5.0(L) -  Total Bilirubin 0.3 - 1.2 mg/dL - 0.6 -  Alkaline Phos 38 - 126 U/L -  176(H) -  AST 15 - 41 U/L - 81(H) -  ALT 0 - 44 U/L - 85(H) -    CBC Latest Ref Rng & Units 11/01/2019 10/31/2019 10/30/2019  WBC 4.0 - 10.5 K/uL 4.0 4.9 -  Hemoglobin 13.0 - 17.0 g/dL 9.6(L) 9.9(L) 9.3(L)  Hematocrit 39.0 - 52.0 % 29.9(L) 30.8(L) 29.1(L)  Platelets 150 - 400 K/uL 93(L) 111(L) -    ABG    Component Value Date/Time   PHART 7.404 10/29/2019 2140   PCO2ART 42.5 10/29/2019 2140   PO2ART 59.5 (L) 10/29/2019 2140   HCO3 26.0 10/29/2019 2140   ACIDBASEDEF 1.4 10/29/2019 1404   O2SAT 87.4 10/29/2019 2140    CBG (last 3)  Recent Labs    10/31/19 1639 10/31/19 2135 11/01/19 0750  GLUCAP 111* 101* 101*    Sherrilyn Rist, MD [place], PCCM Cell: [phone]

## 2019-11-01 NOTE — Progress Notes (Addendum)
[place] Progress Note Patient Name: Isaac Pierce DOB: 03-Apr-1961 MRN: [id number]   Date of Service  11/01/2019  HPI/Events of Note  Notified of chest pain and flank pain- seen on camera. Notes pain around the site of thoracentesis but also radiating in front of his chest. Some cough, pain is associated with cough. RN notes he was on room air earlier, then placed on 4 liter . HR mostly in 120s, now 130. No hypotension. NO substernal pain, pain does not sound ischemic on questioning.   eICU Interventions  Check CXR and EKG stat Pain control - now getting norco, possibly pleuritic pain post fluid removal If interventions do not help, we may need to scan to rule out PE Of note, removed O2 in his room and have observed him on camera - O2 sat is 95 on room air   RN will call when X ray/EKG completed     Intervention Category Major Interventions: Respiratory failure - evaluation and management  Talisha Erby G Sadae Arrazola 11/01/2019, 12:06 AM   Addendum I followed up on patient Much improved after norco HR is now 103 Stable BP O2 sat 97 Nicotine patch and stool softner requested, ordered

## 2019-11-02 ENCOUNTER — Telehealth: Payer: Self-pay | Admitting: Medical Oncology

## 2019-11-02 LAB — GLUCOSE, CAPILLARY
Glucose-Capillary: 73 mg/dL (ref 70–99)
Glucose-Capillary: 83 mg/dL (ref 70–99)
Glucose-Capillary: 147 mg/dL — ABNORMAL HIGH (ref 70–99)
Glucose-Capillary: 133 mg/dL — ABNORMAL HIGH (ref 70–99)

## 2019-11-02 MED ORDER — DILTIAZEM HCL 25 MG/5ML IV SOLN
15.0000 mg | Freq: Once | INTRAVENOUS | Status: DC
  Filled 2019-11-02: qty 5

## 2019-11-02 MED ORDER — METOPROLOL TARTRATE 5 MG/5ML IV SOLN
INTRAVENOUS | Status: AC
  Administered 2019-11-02: 2.5 mg
  Filled 2019-11-02: qty 5

## 2019-11-02 MED ORDER — PREDNISONE 20 MG PO TABS
20.0000 mg | ORAL_TABLET | Freq: Two times a day (BID) | ORAL | Status: DC
  Administered 2019-11-02 – 2019-11-11 (×18): 20 mg via ORAL
  Filled 2019-11-02 (×18): qty 1

## 2019-11-02 MED ORDER — DILTIAZEM LOAD VIA INFUSION
15.0000 mg | Freq: Once | INTRAVENOUS | Status: DC
  Filled 2019-11-02: qty 15

## 2019-11-02 MED ORDER — METHYLPREDNISOLONE SODIUM SUCC 125 MG IJ SOLR
60.0000 mg | Freq: Once | INTRAMUSCULAR | Status: AC
  Administered 2019-11-02: 60 mg via INTRAVENOUS
  Filled 2019-11-02: qty 2

## 2019-11-02 MED ORDER — DILTIAZEM LOAD VIA INFUSION
15.0000 mg | Freq: Once | INTRAVENOUS | Status: AC
  Administered 2019-11-02: 15 mg via INTRAVENOUS
  Filled 2019-11-02: qty 15

## 2019-11-02 MED ORDER — DILTIAZEM HCL-DEXTROSE 125-5 MG/125ML-% IV SOLN (PREMIX)
5.0000 mg/h | INTRAVENOUS | Status: DC
  Administered 2019-11-02: 15 mg/h via INTRAVENOUS
  Administered 2019-11-02: 5 mg/h via INTRAVENOUS
  Administered 2019-11-03 – 2019-11-04 (×3): 15 mg/h via INTRAVENOUS
  Filled 2019-11-02 (×5): qty 125

## 2019-11-02 NOTE — Telephone Encounter (Signed)
Isaac Pierce is very discouraged. Sister called and said pt was moved to critical care with respiratory distress and Bi-PAP is being considered.  Isaac Pierce trusts Dr Julien Nordmann and trusts his decisions and care. Will Dr Julien Nordmann see him in hospital?   Isaac Pierce requesting a call from Dr Julien Nordmann for his thoughts about down the road tx.

## 2019-11-02 NOTE — Progress Notes (Addendum)
HEMATOLOGY-ONCOLOGY PROGRESS NOTE  SUBJECTIVE: Appears more short of breath this morning.  He is not complaining of any pain.  States that he wants to be left alone for at least 20 minutes.  He offers no other complaints this morning.  Oncology History  Non-Hodgkin lymphoma ([place])  08/25/2019 Initial Diagnosis   Non-Hodgkin lymphoma ([place])   08/25/2019 - 08/31/2019 Chemotherapy   The patient had riTUXimab (RITUXAN) 700 mg in sodium chloride 0.9 % 250 mL (2.1875 mg/mL) infusion, 375 mg/m2 = 700 mg (100 % of original dose 375 mg/m2), Intravenous,  Once, 1 of 1 cycle Dose modification: 375 mg/m2 (original dose 375 mg/m2, Cycle 1) Administration: 700 mg (08/25/2019) riTUXimab-pvvr (RUXIENCE) 700 mg in sodium chloride 0.9 % 250 mL (2.1875 mg/mL) infusion, 375 mg/m2 = 700 mg (100 % of original dose 375 mg/m2), Intravenous,  Once, 0 of 3 cycles Dose modification: 375 mg/m2 (original dose 375 mg/m2, Cycle 2), 375 mg/m2 (original dose 375 mg/m2, Cycle 3)  for chemotherapy treatment.    09/01/2019 - 10/12/2019 Chemotherapy   The patient had DOXOrubicin (ADRIAMYCIN) chemo injection 68 mg, 37.5 mg/m2 = 68 mg (75 % of original dose 50 mg/m2), Intravenous,  Once, 2 of 6 cycles Dose modification: 37.5 mg/m2 (75 % of original dose 50 mg/m2, Cycle 1, Reason: Provider Judgment) Administration: 68 mg (09/01/2019), 68 mg (09/22/2019) palonosetron (ALOXI) injection 0.25 mg, 0.25 mg, Intravenous,  Once, 2 of 6 cycles Administration: 0.25 mg (09/01/2019), 0.25 mg (09/22/2019) pegfilgrastim-cbqv (UDENYCA) injection 6 mg, 6 mg, Subcutaneous, Once, 1 of 5 cycles Administration: 6 mg (09/23/2019) vinCRIStine (ONCOVIN) 1.5 mg in sodium chloride 0.9 % 50 mL chemo infusion, 1.5 mg (75 % of original dose 2 mg), Intravenous,  Once, 2 of 6 cycles Dose modification: 1.5 mg (75 % of original dose 2 mg, Cycle 1, Reason: Provider Judgment) Administration: 1.5 mg (09/01/2019), 1.5 mg (09/22/2019) riTUXimab (RITUXAN) 700 mg in sodium  chloride 0.9 % 250 mL (2.1875 mg/mL) infusion, 375 mg/m2 = 700 mg (100 % of original dose 375 mg/m2), Intravenous,  Once, 1 of 1 cycle Dose modification: 375 mg/m2 (original dose 375 mg/m2, Cycle 1) Administration: 700 mg (09/01/2019) cyclophosphamide (CYTOXAN) 1,020 mg in sodium chloride 0.9 % 250 mL chemo infusion, 562.5 mg/m2 = 1,020 mg (75 % of original dose 750 mg/m2), Intravenous,  Once, 2 of 6 cycles Dose modification: 562.5 mg/m2 (75 % of original dose 750 mg/m2, Cycle 1, Reason: Provider Judgment) Administration: 1,020 mg (09/01/2019), 1,020 mg (09/22/2019) fosaprepitant (EMEND) 150 mg, dexamethasone (DECADRON) 12 mg in sodium chloride 0.9 % 145 mL IVPB, , Intravenous,  Once, 2 of 6 cycles Administration:  (09/01/2019),  (09/22/2019) riTUXimab-pvvr (RUXIENCE) 700 mg in sodium chloride 0.9 % 250 mL (2.1875 mg/mL) infusion, 375 mg/m2 = 700 mg, Intravenous,  Once, 1 of 1 cycle  for chemotherapy treatment.    10/22/2019 -  Chemotherapy   The patient had obinutuzumab (GAZYVA) 100 mg in sodium chloride 0.9 % 100 mL (0.9615 mg/mL) chemo infusion, 100 mg, Intravenous, Once, 1 of 6 cycles Administration: 100 mg (10/22/2019), 1,000 mg (10/29/2019)  for chemotherapy treatment.       REVIEW OF SYSTEMS:   Noncontributory except as noted in the HPI.  PHYSICAL EXAMINATION: ECOG PERFORMANCE STATUS: 1 - Symptomatic but completely ambulatory  Vitals:   11/02/19 0900 11/02/19 1000  BP: 130/84 111/68  Pulse: (!) 137 (!) 131  Resp: 20 20  Temp:    SpO2: 97% 95%   Filed Weights   10/31/19 0500 11/01/19 0500 11/02/19  0500  Weight: 158 lb 1.1 oz (71.7 kg) 162 lb 7.7 oz (73.7 kg) 160 lb 4.4 oz (72.7 kg)    Intake/Output from previous day: 02/01 0701 - 02/02 0700 In: [id number] [P.O.:1080; I.V.:3; IV Piggyback:450] Out: 1975 [id number]  GENERAL:alert, no distress, appears short of breath at times when talking LYMPH: Persistent left axillary lymphadenopathy LUNGS: Diminished breath sounds to the  left base, otherwise clear HEART: Tachycardic, 2+ bilateral lower extremity edema ABDOMEN: Positive bowel sounds, soft, nontender Musculoskeletal:no cyanosis of digits and no clubbing  NEURO: alert & oriented x 3 with fluent speech, no focal motor/sensory deficits  LABORATORY DATA:  I have reviewed the data as listed CMP Latest Ref Rng & Units 11/01/2019 10/31/2019 10/30/2019  Glucose 70 - 99 mg/dL 124(H) 144(H) 136(H)  BUN 6 - 20 mg/dL 24(H) 21(H) 24(H)  Creatinine 0.61 - 1.24 mg/dL 0.46(L) 0.56(L) 0.49(L)  Sodium 135 - 145 mmol/L 139 141 141  Potassium 3.5 - 5.1 mmol/L 3.9 3.7 3.4(L)  Chloride 98 - 111 mmol/L 103 104 105  CO2 22 - 32 mmol/L 25 25 26   Calcium 8.9 - 10.3 mg/dL 9.1 9.3 8.6(L)  Total Protein 6.5 - 8.1 g/dL - 5.0(L) -  Total Bilirubin 0.3 - 1.2 mg/dL - 0.6 -  Alkaline Phos 38 - 126 U/L - 176(H) -  AST 15 - 41 U/L - 81(H) -  ALT 0 - 44 U/L - 85(H) -    Lab Results  Component Value Date   WBC 4.0 11/01/2019   HGB 9.6 (L) 11/01/2019   HCT 29.9 (L) 11/01/2019   MCV 93.4 11/01/2019   PLT 93 (L) 11/01/2019   NEUTROABS 1.9 10/30/2019    CT CHEST WO CONTRAST  Result Date: 10/29/2019 CLINICAL DATA:  With lymphoma, abnormal radiograph EXAM: CT CHEST WITHOUT CONTRAST TECHNIQUE: Multidetector CT imaging of the chest was performed following the standard protocol without IV contrast. COMPARISON:  Radiograph 10/28/2018, CT 08/25/2019 FINDINGS: Cardiovascular: Left IJ approach Port-A-Cath is accessed with the tip positioned at the right atrium. Normal cardiac size. Redemonstration of the extensive pericardial soft tissue attenuation lesions these appear to extend more superiorly towards the cardiac base than on comparison study including a larger crescentic amount of soft tissue measuring approximately 6.9 cm anteroposterior and 1.7 cm in thickness near the level of the pulmonary trunk (2/79). Central pulmonary arteries are normal caliber. Atherosclerotic plaque within the normal  caliber aorta. Normal 3 vessel branching of the aortic arch with minimal plaque in the great vessels. Mediastinum/Nodes: Decrease in the size of bulky thoracic lymphadenopathy compatible with patient's known lymphoproliferative disorder. Index lymph nodes include: *A high right paratracheal node measuring 12 mm short axis (2/45), previously 23 mm *AP window lymph node measuring 24 mm (4/35), previously 29 mm *Right hilar node measuring 15 mm (2/85), previously 21 mm *Subcarinal node measuring 18 mm (2/69), previously 26 mm *Right inter lobar lymph node on comparison is difficult to assess given absence of contrast media. Thyroid gland is unremarkable. No acute abnormality of the trachea or esophagus. Lungs/Pleura: Large bilateral pleural effusions are noted. Dense bandlike areas of atelectasis are noted in both lungs. Multiple nodules are seen in the right middle and lower lobe examples include a 8 mm nodule laterally (4/102) in lower lobe and a subpleural 6 mm nodule anteriorly in the right middle lobe (4/98). No visible nodules seen in the left lung though underlying atelectasis could obscure the presence of nodules or masses. Upper Abdomen: Marked splenomegaly. Some wedge-shaped hypoattenuation in the periphery  of the spleen measuring 1.6 x 3.2 cm, compatible with sequela of remote splenic infarct. Indeterminate exophytic 12 mm lesion arising from the upper pole right kidney. Musculoskeletal: Multilevel degenerative changes are present in the imaged portions of the spine. Evaluation of the chest wall limited given extensive motion artifact which may limit detection of sternal and rib fractures. No gross acute osseous abnormality or suspicious osseous lesions. IMPRESSION: 1. Mixed response of patient's lymphoproliferative disease with overall decrease in the size of bulky thoracic lymphadenopathy but with increasing pericardial soft tissue masses and multiple new lung nodules. 2. Large bilateral pleural effusions  with associated compressive atelectasis. 3. 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 4. Indeterminate 12 mm lesion arising from the upper pole of the right kidney. Indeterminate on noncontrast CT. Could consider follow-up with nonemergent outpatient MR or CT. 5. Evaluation of the chest wall limited given extensive motion artifact which may limit detection of sternal and rib fractures. No gross acute osseous abnormality. 6.  Aortic Atherosclerosis (ICD10-I70.0). Electronically Signed   By: Lovena Le M.D.   On: 10/29/2019 20:36   DG CHEST PORT 1 VIEW  Result Date: 11/01/2019 CLINICAL DATA:  Status post thoracentesis EXAM: PORTABLE CHEST 1 VIEW COMPARISON:  10/31/19 FINDINGS: Cardiac shadow is stable. Right chest wall port is noted. Small pleural effusions are noted bilaterally with basilar atelectasis. No pneumothorax is seen. IMPRESSION: Small pleural effusions bilaterally with basilar atelectasis. No pneumothorax is identified. Electronically Signed   By: Inez Catalina M.D.   On: 11/01/2019 01:48   DG Chest Port 1 View  Result Date: 10/31/2019 CLINICAL DATA:  Status post left thoracentesis. EXAM: PORTABLE CHEST 1 VIEW COMPARISON:  October 31, 2019 FINDINGS: The left-sided pleural effusion is smaller after thoracentesis. No pneumothorax. Bilateral pleural effusions remain. The right Port-A-Cath is stable. No other interval changes. IMPRESSION: The left-sided pleural effusion is smaller after thoracentesis. No pneumothorax. No other changes. Electronically Signed   By: Dorise Bullion III M.D   On: 10/31/2019 10:29   DG Chest Port 1 View  Result Date: 10/31/2019 CLINICAL DATA:  Follow-up pleural effusion EXAM: PORTABLE CHEST 1 VIEW COMPARISON:  10/30/2019 FINDINGS: Power port unchanged. Bilateral effusions, larger on the left than the right. Volume loss in the lower lungs, worse on the left than the right. No new radiographic finding. IMPRESSION: No change. Large bilateral effusions left  more than right with dependent volume loss left more than right. Electronically Signed   By: Nelson Chimes M.D.   On: 10/31/2019 06:00   DG Chest Port 1 View  Result Date: 10/30/2019 CLINICAL DATA:  Respiratory failure. EXAM: PORTABLE CHEST 1 VIEW COMPARISON:  Chest radiograph 10/29/2019 FINDINGS: Right anterior chest wall Port-A-Cath is present with tip projecting over the superior vena cava. Monitoring leads overlie the patient. Stable enlarged cardiac and mediastinal contours. Persistent moderate to large layering bilateral pleural effusions with underlying pulmonary consolidation. No pneumothorax. IMPRESSION: Persistent moderate to large layering bilateral effusions with underlying consolidation. Electronically Signed   By: Lovey Newcomer M.D.   On: 10/30/2019 04:58   DG Chest Port 1 View  Result Date: 10/29/2019 CLINICAL DATA:  Shortness of breath EXAM: PORTABLE CHEST 1 VIEW COMPARISON:  09/01/2019, CT 08/25/2019 FINDINGS: Right-sided central venous port tip over the cavoatrial region allowing for rotated patient. Suspected moderate layering right pleural effusion, probably increased from prior radiograph. Moderate left pleural effusion, also suspect increase in size. Enlarged cardiomediastinal silhouette with vascular congestion. Consolidation at the left lung base. Fluid in the  right fissure. Hazy appearance of both thoraces likely due to layering fluid although underlying pulmonary edema could be contributing. No pneumothorax. IMPRESSION: Hazy appearance of the bilateral lungs since 09/01/2019, suspected to be secondary to moderate layering pleural effusions, probably increased in the interim. Cardiomegaly with vascular congestion; hazy lung appearance could also be due to associated edema. Atelectasis or pneumonia at the left base. Electronically Signed   By: Donavan Foil M.D.   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  He had bulky lymphadenopathy in the chest and abdomen as well as hepatosplenomegaly and involvement of the bone marrow.  He also had significant pancytopenia on presentation.  He was initially tried on single agent Rituxan but this was discontinued secondary to hypersensitivity reaction.  He received 2 cycles of CHOP, but did not respond well.  His chemotherapy was switched to Obinutuzumab and acalabrutinib.  He had an infusion reaction to obinutuzumab and is now hospitalized.  Status post thoracentesis and cytology is pending.  Appears more short of breath this morning.  Prednisone is being increased have oncology.  Appreciate their assistance.  Plan to have further discussions with the patient regarding treatment for his small lymphocytic lymphoma/CLL as an outpatient.  We will also follow-up on cytology.   LOS: 4 days   Mikey Bussing, DNP, AGPCNP-BC, AOCNP 11/02/19  ADDENDUM: Hematology/Oncology Attending: I had a face-to-face encounter with the patient today.  I agree with the above note.  This is a very pleasant 59 years old white male with bulky stage IV small lymphocytic lymphoma/CLL diagnosed in November 2020.  He was tried on treatment with Rituxan in the past and had significant hypersensitivity reaction.  The patient also received 2 cycles of chemotherapy with CHOP with minimal response.  His treatment was switched to obinutuzumab and acalabrutinib.  He received 1 dose of obinutuzumab and has hypersensitivity reaction to the second infusion.  He has not started the treatment with acalabrutinib yet. The patient continues to have significant shortness of breath as well as abdominal distention and recurrent left pleural effusion. We will recommend for the patient to have repeat thoracentesis if needed based on the imaging studies. I also recommended for the patient to start his treatment with acalabrutinib for hopefully better control of his  disease. Thank you for taking good care of Isaac Pierce, I will continue to follow up the patient with you and assist in his management on as-needed basis.  Disclaimer: This note was dictated with voice recognition software. Similar sounding words can inadvertently be transcribed and may be missed upon review. Eilleen Kempf, MD

## 2019-11-02 NOTE — Progress Notes (Signed)
Attempting to clarify code status with patient, patient became irate asking all staff to get out of his room and referring to staff as "fruit loops who escaped from somewhere" and derogatory racial remarks. Patient yelling and cussing at medical staff. Patient deescalated and informed verbal abuse will not be tolerated. Patient states he does not want to talk about death but would want [place] to use all measures to revive him in event of cardiac or respiratory arrest but does not want intubation prior to arrest, stating "I will shoot whoever attempts to put a hole in my neck."  MD notified, patient to remain full code.

## 2019-11-02 NOTE — Progress Notes (Signed)
NAME:  Isaac Pierce, MRN:  [id number], DOB:  Jun 13, 1961, LOS: 4 ADMISSION DATE:  10/29/2019, CONSULTATION DATE:  10/29/2019  REFERRING MD:  Dr Francia Greaves of [facility], CHIEF COMPLAINT:  Acute resp failure following Mab infussion   Brief History   59 yo male with hx of CLL/small B cell lymphoma failed CHOP, and started obinutuzumab with cycle 1 on January 22.  Received PRBC transfusion January 28.  On January 29 was started on cycle 2 of CD20 antibody, and then developed stridor with diaphoresis and respiratory distress.  Treated with steroids and epinephrine in ER, and required Bipap. Had thoracentesis with 2 L on 10/31/19  Past Medical History  GERD, Anxiety, Back pain  Significant Hospital Events   1/29 admit 1/30 transfuse 2 units PRBC 1/31 thoracentesis Consults:  Oncology  Procedures:  Thoracentesis 1/31-2 L of serous fluid  Significant Diagnostic Tests:  CT chest 1/29 >> decreased size of thoracic LN, increased size of pericardial soft tissue masses and multiple new lung nodules, large b/l effusions, splenomegaly Lt thoracentesis 1/31 >>   Micro Data:  SARS CoV2 PCR 1/29 >> negative Influenza PCR 1/29 >> A and B negative Blood 1/29 >> RVP 1/29 >> negative Urine 1/29 >> negative Pneumococcal Ag 1/29 >> negative Legionella Ag 1/29 >> negative Lt pleural fluid 1/31 >>   Antimicrobials:  Vancomycin 1/29  Cefepime 1/29 >>   Interim history/subjective:  More short of breath today Tachycardic  Objective   Blood pressure 111/68, pulse (!) 131, temperature (!) 97.4 F (36.3 C), temperature source Axillary, resp. rate 20, height 5\' 11"  (1.803 m), weight 72.7 kg, SpO2 95 %.        Intake/Output Summary (Last 24 hours) at 11/02/2019 1128 Last data filed at 11/02/2019 [id number] Gross per 24 hour  Intake 1023 ml  Output 1025 ml  Net -2 ml   Filed Weights   10/31/19 0500 11/01/19 0500 11/02/19 0500  Weight: 71.7 kg 73.7 kg 72.7 kg    Examination:  General -alert and oriented  x3 HEENT-pupils reactive, moist oral mucosa Cardiac -S1-S2 appreciated, tachycardic Chest -decreased air entry at the bases bilaterally Abdomen -bowel sounds appreciated Extremities -2+ edema bilaterally  Chest x-ray 2/1 reviewed by myself showing small pleural effusions bilaterally  Resolved Hospital Problem list     Assessment & Plan:  Acute hypoxic respiratory failure from allergic reaction to obinutuzumab Bilateral pleural effusion s/p thoracentesis on the left Post thoracentesis on 131 for 2 L of serous fluid -Chest x-ray shows improvement -Continue prednisone -More short of breath today we will increase prednisone to 20 twice daily -Pleural fluid is exudative, no organism  Fever -Day 5 of antibiotics  -Discontinue antibiotics after 5 days  Electrolyte derangement including hypokalemia, hypomagnesemia -Repleted  CLL/small B-cell lymphoma -Hematology/oncology following  Anemia, thrombocytopenia chronic disease -Monitor -Transfuse per protocol  History of GERD -Protonix  Steroid-induced hyperglycemia -SSI  Tachycardia -Likely multifactorial -Beta-blockade did help -I will start him on Cardizem  Best practice:  Diet: regular diet DVT prophylaxis: scd GI prophylaxis: ppi Mobility: bed rest Code status: full code Disposition: step down unit  Labs:   CMP Latest Ref Rng & Units 11/01/2019 10/31/2019 10/30/2019  Glucose 70 - 99 mg/dL 124(H) 144(H) 136(H)  BUN 6 - 20 mg/dL 24(H) 21(H) 24(H)  Creatinine 0.61 - 1.24 mg/dL 0.46(L) 0.56(L) 0.49(L)  Sodium 135 - 145 mmol/L 139 141 141  Potassium 3.5 - 5.1 mmol/L 3.9 3.7 3.4(L)  Chloride 98 - 111 mmol/L 103 104 105  CO2 22 -  32 mmol/L 25 25 26   Calcium 8.9 - 10.3 mg/dL 9.1 9.3 8.6(L)  Total Protein 6.5 - 8.1 g/dL - 5.0(L) -  Total Bilirubin 0.3 - 1.2 mg/dL - 0.6 -  Alkaline Phos 38 - 126 U/L - 176(H) -  AST 15 - 41 U/L - 81(H) -  ALT 0 - 44 U/L - 85(H) -    CBC Latest Ref Rng & Units 11/01/2019 10/31/2019  10/30/2019  WBC 4.0 - 10.5 K/uL 4.0 4.9 -  Hemoglobin 13.0 - 17.0 g/dL 9.6(L) 9.9(L) 9.3(L)  Hematocrit 39.0 - 52.0 % 29.9(L) 30.8(L) 29.1(L)  Platelets 150 - 400 K/uL 93(L) 111(L) -    ABG    Component Value Date/Time   PHART 7.404 10/29/2019 2140   PCO2ART 42.5 10/29/2019 2140   PO2ART 59.5 (L) 10/29/2019 2140   HCO3 26.0 10/29/2019 2140   ACIDBASEDEF 1.4 10/29/2019 1404   O2SAT 87.4 10/29/2019 2140    CBG (last 3)  Recent Labs    11/01/19 1702 11/01/19 2156 11/02/19 0821  GLUCAP 90 109* 73   The patient is critically ill with multiple organ systems failure and requires high complexity decision making for assessment and support, frequent evaluation and titration of therapies, application of advanced monitoring technologies and extensive interpretation of multiple databases. Critical Care Time devoted to patient care services described in this note independent of APP/resident time (if applicable)  is 30 minutes.   Sherrilyn Rist MD [facility] Personal pager: [phone] If unanswered, please page CCM On-call: [phone]

## 2019-11-02 NOTE — Telephone Encounter (Signed)
I saw him today.  He is feeling better.  We will need to see if his daughter receive the acalabrutinib at home so we can start it in the hospital.

## 2019-11-03 ENCOUNTER — Telehealth: Payer: Self-pay

## 2019-11-03 LAB — MAGNESIUM: Magnesium: 1.6 mg/dL — ABNORMAL LOW (ref 1.7–2.4)

## 2019-11-03 LAB — CULTURE, BLOOD (ROUTINE X 2)
Culture: NO GROWTH
Culture: NO GROWTH
Culture: NO GROWTH
Culture: NO GROWTH
Special Requests: ADEQUATE
Special Requests: ADEQUATE
Special Requests: ADEQUATE
Special Requests: ADEQUATE

## 2019-11-03 LAB — CBC WITH DIFFERENTIAL/PLATELET
Abs Immature Granulocytes: 0.04 10*3/uL (ref 0.00–0.07)
Basophils Absolute: 0 10*3/uL (ref 0.0–0.1)
Basophils Relative: 0 %
Eosinophils Absolute: 0 10*3/uL (ref 0.0–0.5)
Eosinophils Relative: 0 %
HCT: 33.2 % — ABNORMAL LOW (ref 39.0–52.0)
Hemoglobin: 10.5 g/dL — ABNORMAL LOW (ref 13.0–17.0)
Immature Granulocytes: 1 %
Lymphocytes Relative: 1 %
Lymphs Abs: 0.1 10*3/uL — ABNORMAL LOW (ref 0.7–4.0)
MCH: 29.2 pg (ref 26.0–34.0)
MCHC: 31.6 g/dL (ref 30.0–36.0)
MCV: 92.2 fL (ref 80.0–100.0)
Monocytes Absolute: 0.5 10*3/uL (ref 0.1–1.0)
Monocytes Relative: 9 %
Neutro Abs: 5.5 10*3/uL (ref 1.7–7.7)
Neutrophils Relative %: 89 %
Platelets: 113 10*3/uL — ABNORMAL LOW (ref 150–400)
RBC: 3.6 MIL/uL — ABNORMAL LOW (ref 4.22–5.81)
RDW: 16.8 % — ABNORMAL HIGH (ref 11.5–15.5)
WBC: 6.1 10*3/uL (ref 4.0–10.5)
nRBC: 0 % (ref 0.0–0.2)

## 2019-11-03 LAB — BODY FLUID CULTURE
Culture: NO GROWTH
Special Requests: NORMAL

## 2019-11-03 LAB — BASIC METABOLIC PANEL
Anion gap: 12 (ref 5–15)
BUN: 23 mg/dL — ABNORMAL HIGH (ref 6–20)
CO2: 26 mmol/L (ref 22–32)
Calcium: 9.1 mg/dL (ref 8.9–10.3)
Chloride: 97 mmol/L — ABNORMAL LOW (ref 98–111)
Creatinine, Ser: 0.6 mg/dL — ABNORMAL LOW (ref 0.61–1.24)
GFR calc Af Amer: >60 mL/min (ref >60)
GFR calc non Af Amer: >60 mL/min (ref >60)
Glucose, Bld: 121 mg/dL — ABNORMAL HIGH (ref 70–99)
Potassium: 4.2 mmol/L (ref 3.5–5.1)
Sodium: 135 mmol/L (ref 135–145)

## 2019-11-03 LAB — GLUCOSE, CAPILLARY
Glucose-Capillary: 104 mg/dL — ABNORMAL HIGH (ref 70–99)
Glucose-Capillary: 152 mg/dL — ABNORMAL HIGH (ref 70–99)
Glucose-Capillary: 145 mg/dL — ABNORMAL HIGH (ref 70–99)
Glucose-Capillary: 146 mg/dL — ABNORMAL HIGH (ref 70–99)

## 2019-11-03 MED ORDER — MORPHINE SULFATE ER 15 MG PO TBCR
15.0000 mg | EXTENDED_RELEASE_TABLET | Freq: Two times a day (BID) | ORAL | Status: DC
  Administered 2019-11-03 – 2019-11-06 (×6): 15 mg via ORAL
  Filled 2019-11-03 (×5): qty 1

## 2019-11-03 MED ORDER — GUAIFENESIN ER 600 MG PO TB12
600.0000 mg | ORAL_TABLET | Freq: Two times a day (BID) | ORAL | Status: DC
  Administered 2019-11-03 – 2019-11-12 (×19): 600 mg via ORAL
  Filled 2019-11-03 (×19): qty 1

## 2019-11-03 MED ORDER — MORPHINE SULFATE ER 15 MG PO TBCR
15.0000 mg | EXTENDED_RELEASE_TABLET | Freq: Two times a day (BID) | ORAL | Status: DC
  Filled 2019-11-03: qty 1

## 2019-11-03 MED ORDER — MAGNESIUM SULFATE 2 GM/50ML IV SOLN
2.0000 g | Freq: Once | INTRAVENOUS | Status: AC
  Administered 2019-11-03: 2 g via INTRAVENOUS
  Filled 2019-11-03: qty 50

## 2019-11-03 MED ORDER — MORPHINE SULFATE ER 15 MG PO TBCR: 15.0000 mg | EXTENDED_RELEASE_TABLET | Freq: Two times a day (BID) | ORAL | Status: DC

## 2019-11-03 NOTE — Telephone Encounter (Signed)
[place] [facility] will deliver [place] to [place] at [facility] 11/04/19.

## 2019-11-03 NOTE — Progress Notes (Addendum)
NAME:  Isaac Pierce, MRN:  [id number], DOB:  1960-12-30, LOS: 5 ADMISSION DATE:  10/29/2019, CONSULTATION DATE:  10/29/2019  REFERRING MD:  Dr Francia Greaves of [facility], CHIEF COMPLAINT:  Acute resp failure following Mab infussion   Brief History   59 yo male with hx of CLL/small B cell lymphoma failed CHOP, and started obinutuzumab with cycle 1 on January 22.  Received PRBC transfusion January 28.  On January 29 was started on cycle 2 of CD20 antibody, and then developed stridor with diaphoresis and respiratory distress.  Treated with steroids and epinephrine in ER, and required Bipap. Had thoracentesis with 2 L on 10/31/19  Past Medical History  GERD, Anxiety, Back pain  Significant Hospital Events   1/29 admit 1/30 transfuse 2 units PRBC 1/31 thoracentesis Consults:  Oncology  Procedures:  Thoracentesis 1/31-2 L of serous fluid  Significant Diagnostic Tests:  CT chest 1/29 >> decreased size of thoracic LN, increased size of pericardial soft tissue masses and multiple new lung nodules, large b/l effusions, splenomegaly Lt thoracentesis 1/31 >>   Micro Data:  SARS CoV2 PCR 1/29 >> negative Influenza PCR 1/29 >> A and B negative Blood 1/29 >> RVP 1/29 >> negative Urine 1/29 >> negative Pneumococcal Ag 1/29 >> negative Legionella Ag 1/29 >> negative Lt pleural fluid 1/31 >>   Antimicrobials:  Vancomycin 1/29  Cefepime 1/29 >>   Interim history/subjective:  More comfortable today less short of breath Heart rate better controlled Objective   Blood pressure 131/65, pulse (!) 117, temperature 98.1 F (36.7 C), temperature source Oral, resp. rate (!) 21, height 5\' 11"  (1.803 m), weight 72 kg, SpO2 (!) 88 %.        Intake/Output Summary (Last 24 hours) at 11/03/2019 1131 Last data filed at 11/03/2019 1000 Gross per 24 hour  Intake 713.43 ml  Output 900 ml  Net -186.57 ml   Filed Weights   11/01/19 0500 11/02/19 0500 11/03/19 0500  Weight: 73.7 kg 72.7 kg 72 kg     Examination:  General -alert and oriented x3 HEENT-pupils reactive, moist oral mucosa Cardiac -S1-S2 appreciated Chest -decreased air entry bilaterally Abdomen -bowel sounds appreciated Extremities -2+ edema bilaterally  Chest x-ray 2/1 reviewed by myself showing small pleural effusions bilaterally  Resolved Hospital Problem list     Assessment & Plan:  Acute hypoxic respiratory failure from allergic reaction to Obinutuzimab Bilateral pleural effusion s/p thoracentesis on the left -Post thoracentesis for 2 L of fluid -Chest x-ray with improvement -Continue steroids -Exudative pleural effusion, no organism  Fever resolved -Completed antibiotics  Hypokalemia, hypomagnesemia -Repleted  Electrolyte derangement including hypokalemia, hypomagnesemia -Repleted -Give 1 more dose of magnesium today  CLL/small B-cell lymphoma -Hematology/oncology following  Anemia, thrombocytopenia -Continue to monitor -Transfuse per protocol  Steroid-induced hyperglycemia -SSI  Tachycardia -Likely multifactorial -Controlled on Cardizem  Chest x-ray and labs for a.m. Mucinex added Acapella added  I will add opiates for chronic shortness of breath -This should help work of breathing -MS Contin 15 twice daily  Risk of decompensation remains very high  Best practice:  Diet: regular diet DVT prophylaxis: scd GI prophylaxis: ppi Mobility: bed rest Code status: full code Disposition: step down unit  Labs:   CMP Latest Ref Rng & Units 11/03/2019 11/01/2019 10/31/2019  Glucose 70 - 99 mg/dL 121(H) 124(H) 144(H)  BUN 6 - 20 mg/dL 23(H) 24(H) 21(H)  Creatinine 0.61 - 1.24 mg/dL 0.60(L) 0.46(L) 0.56(L)  Sodium 135 - 145 mmol/L 135 139 141  Potassium 3.5 - 5.1  mmol/L 4.2 3.9 3.7  Chloride 98 - 111 mmol/L 97(L) 103 104  CO2 22 - 32 mmol/L 26 25 25   Calcium 8.9 - 10.3 mg/dL 9.1 9.1 9.3  Total Protein 6.5 - 8.1 g/dL - - 5.0(L)  Total Bilirubin 0.3 - 1.2 mg/dL - - 0.6  Alkaline  Phos 38 - 126 U/L - - 176(H)  AST 15 - 41 U/L - - 81(H)  ALT 0 - 44 U/L - - 85(H)    CBC Latest Ref Rng & Units 11/03/2019 11/01/2019 10/31/2019  WBC 4.0 - 10.5 K/uL 6.1 4.0 4.9  Hemoglobin 13.0 - 17.0 g/dL 10.5(L) 9.6(L) 9.9(L)  Hematocrit 39.0 - 52.0 % 33.2(L) 29.9(L) 30.8(L)  Platelets 150 - 400 K/uL 113(L) 93(L) 111(L)    ABG    Component Value Date/Time   PHART 7.404 10/29/2019 2140   PCO2ART 42.5 10/29/2019 2140   PO2ART 59.5 (L) 10/29/2019 2140   HCO3 26.0 10/29/2019 2140   ACIDBASEDEF 1.4 10/29/2019 1404   O2SAT 87.4 10/29/2019 2140    CBG (last 3)  Recent Labs    11/02/19 1653 11/02/19 2154 11/03/19 0746  GLUCAP 147* 133* 104*   The patient is critically ill with multiple organ systems failure and requires high complexity decision making for assessment and support, frequent evaluation and titration of therapies, application of advanced monitoring technologies and extensive interpretation of multiple databases. Critical Care Time devoted to patient care services described in this note independent of APP/resident time (if applicable)  is 30 minutes.   Sherrilyn Rist MD [facility] Personal pager: [phone] If unanswered, please page CCM On-call: [phone]

## 2019-11-03 NOTE — Progress Notes (Signed)
[facility] Progress Note Patient Name: Isaac Pierce DOB: 1961/06/04 MRN: [id number]   Date of Service  11/03/2019  HPI/Events of Note  Request for AM lab orders.   eICU Interventions  Will order: 1. CBC with platelets, BMP and Mg++ level in AM.     Intervention Category Major Interventions: Other:  Sommer,Steven Cornelia Copa 11/03/2019, 12:24 AM

## 2019-11-03 NOTE — Telephone Encounter (Signed)
Per patient advocate Isaac Pierce, he has been approved for the assistance from [employer] and they were suppose to contact the patient about delivery. We can follow-up on the status of that delivery.

## 2019-11-03 NOTE — Telephone Encounter (Signed)
Called [employer] to check status of delivery for [place]. They had been unable to reach patient. I was able to have delivery set up to [place], [place] [id number]. Expedited delivery for 11/04/19.  [facility] [place] Phone [phone] Fax [phone] 11/03/2019 9:51 AM

## 2019-11-03 NOTE — Progress Notes (Signed)
Patient refused Bipap, despite c/o SOB; RR 26 on 4L . Scheduled nebs just provided by RT, with minimal improvement. Pt is in [place]'s position, PRN pain medication provided by RN. RN will continue to monitor pt response to interventions.

## 2019-11-03 NOTE — Telephone Encounter (Addendum)
Sister does not have medication.   Acalabrutinib is under review at [facility] and me for pt assistance. Pt advocate working on getting drug for pt .

## 2019-11-03 NOTE — Progress Notes (Signed)
Pt refusing oral care and bath or sheet change, will continue to ask. Pt asking for pain medication and breathing treatments every 2 hours. Dr Ander Slade made aware.

## 2019-11-04 ENCOUNTER — Inpatient Hospital Stay (HOSPITAL_COMMUNITY): Payer: Medicaid Other

## 2019-11-04 ENCOUNTER — Inpatient Hospital Stay: Payer: Medicaid Other

## 2019-11-04 LAB — GLUCOSE, CAPILLARY
Glucose-Capillary: 92 mg/dL (ref 70–99)
Glucose-Capillary: 99 mg/dL (ref 70–99)
Glucose-Capillary: 97 mg/dL (ref 70–99)
Glucose-Capillary: 134 mg/dL — ABNORMAL HIGH (ref 70–99)

## 2019-11-04 LAB — BASIC METABOLIC PANEL
Anion gap: 13 (ref 5–15)
BUN: 25 mg/dL — ABNORMAL HIGH (ref 6–20)
CO2: 25 mmol/L (ref 22–32)
Calcium: 9.2 mg/dL (ref 8.9–10.3)
Chloride: 96 mmol/L — ABNORMAL LOW (ref 98–111)
Creatinine, Ser: 0.67 mg/dL (ref 0.61–1.24)
GFR calc Af Amer: >60 mL/min (ref >60)
GFR calc non Af Amer: >60 mL/min (ref >60)
Glucose, Bld: 126 mg/dL — ABNORMAL HIGH (ref 70–99)
Potassium: 4.3 mmol/L (ref 3.5–5.1)
Sodium: 134 mmol/L — ABNORMAL LOW (ref 135–145)

## 2019-11-04 LAB — CBC WITH DIFFERENTIAL/PLATELET
Abs Immature Granulocytes: 0.04 10*3/uL (ref 0.00–0.07)
Basophils Absolute: 0 10*3/uL (ref 0.0–0.1)
Basophils Relative: 0 %
Eosinophils Absolute: 0 10*3/uL (ref 0.0–0.5)
Eosinophils Relative: 0 %
HCT: 33.1 % — ABNORMAL LOW (ref 39.0–52.0)
Hemoglobin: 10.4 g/dL — ABNORMAL LOW (ref 13.0–17.0)
Immature Granulocytes: 1 %
Lymphocytes Relative: 1 %
Lymphs Abs: 0.1 10*3/uL — ABNORMAL LOW (ref 0.7–4.0)
MCH: 29.1 pg (ref 26.0–34.0)
MCHC: 31.4 g/dL (ref 30.0–36.0)
MCV: 92.5 fL (ref 80.0–100.0)
Monocytes Absolute: 0.3 10*3/uL (ref 0.1–1.0)
Monocytes Relative: 8 %
Neutro Abs: 3.5 10*3/uL (ref 1.7–7.7)
Neutrophils Relative %: 90 %
Platelets: 115 10*3/uL — ABNORMAL LOW (ref 150–400)
RBC: 3.58 MIL/uL — ABNORMAL LOW (ref 4.22–5.81)
RDW: 16.7 % — ABNORMAL HIGH (ref 11.5–15.5)
WBC: 4 10*3/uL (ref 4.0–10.5)
nRBC: 0 % (ref 0.0–0.2)

## 2019-11-04 LAB — CYTOLOGY - NON PAP

## 2019-11-04 MED ORDER — LEVALBUTEROL HCL 1.25 MG/0.5ML IN NEBU
1.2500 mg | INHALATION_SOLUTION | RESPIRATORY_TRACT | Status: DC
  Administered 2019-11-04 – 2019-11-08 (×26): 1.25 mg via RESPIRATORY_TRACT
  Filled 2019-11-04 (×26): qty 0.5

## 2019-11-04 MED ORDER — DILTIAZEM HCL 60 MG PO TABS
60.0000 mg | ORAL_TABLET | Freq: Three times a day (TID) | ORAL | Status: DC
  Administered 2019-11-04 – 2019-11-12 (×25): 60 mg via ORAL
  Filled 2019-11-04 (×26): qty 1

## 2019-11-04 MED ORDER — LEVALBUTEROL HCL 1.25 MG/0.5ML IN NEBU: 1.2500 mg | INHALATION_SOLUTION | Freq: Two times a day (BID) | RESPIRATORY_TRACT | Status: DC

## 2019-11-04 MED ORDER — IPRATROPIUM-ALBUTEROL 0.5-2.5 (3) MG/3ML IN SOLN: 3.0000 mL | RESPIRATORY_TRACT | Status: DC | PRN

## 2019-11-04 MED ORDER — FUROSEMIDE 10 MG/ML IJ SOLN
40.0000 mg | Freq: Once | INTRAMUSCULAR | Status: AC
  Administered 2019-11-05: 40 mg via INTRAVENOUS
  Filled 2019-11-04: qty 4

## 2019-11-04 MED ORDER — IPRATROPIUM BROMIDE 0.02 % IN SOLN
0.5000 mg | RESPIRATORY_TRACT | Status: DC
  Administered 2019-11-04 – 2019-11-08 (×26): 0.5 mg via RESPIRATORY_TRACT
  Filled 2019-11-04 (×26): qty 2.5

## 2019-11-04 MED ORDER — FUROSEMIDE 10 MG/ML IJ SOLN
40.0000 mg | Freq: Two times a day (BID) | INTRAMUSCULAR | Status: DC
  Administered 2019-11-04 – 2019-11-12 (×17): 40 mg via INTRAVENOUS
  Filled 2019-11-04 (×17): qty 4

## 2019-11-04 MED ORDER — ACALABRUTINIB 100 MG PO CAPS
100.0000 mg | ORAL_CAPSULE | Freq: Two times a day (BID) | ORAL | Status: DC
  Administered 2019-11-04 – 2019-11-12 (×16): 100 mg via ORAL

## 2019-11-04 NOTE — Progress Notes (Signed)
NAME:  Isaac Pierce, MRN:  [id number], DOB:  December 10, 1960, LOS: 6 ADMISSION DATE:  10/29/2019, CONSULTATION DATE:  10/29/2019  REFERRING MD:  Dr Francia Greaves of [facility], CHIEF COMPLAINT:  Acute resp failure following Mab infussion   Brief History   59 yo male with hx of CLL/small B cell lymphoma failed CHOP, and started obinutuzumab with cycle 1 on January 22.  Received PRBC transfusion January 28.  On January 29 was started on cycle 2 of CD20 antibody, and then developed stridor with diaphoresis and respiratory distress.  Treated with steroids and epinephrine in ER, and required Bipap. Had thoracentesis with 2 L on 10/31/19  Past Medical History  GERD, Anxiety, Back pain  Significant Hospital Events   1/29 admit 1/30 transfuse 2 units PRBC 1/31 thoracentesis Consults:  Oncology  Procedures:  Thoracentesis 1/31-2 L of serous fluid  Significant Diagnostic Tests:  CT chest 1/29 >> decreased size of thoracic LN, increased size of pericardial soft tissue masses and multiple new lung nodules, large b/l effusions, splenomegaly Lt thoracentesis 1/31 >>  Chest x-ray shows bilateral pleural effusion  Micro Data:  SARS CoV2 PCR 1/29 >> negative Influenza PCR 1/29 >> A and B negative Blood 1/29 >> RVP 1/29 >> negative Urine 1/29 >> negative Pneumococcal Ag 1/29 >> negative Legionella Ag 1/29 >> negative Lt pleural fluid 1/31 >>   Antimicrobials:  Vancomycin 1/29  Cefepime 1/29 >>   Interim history/subjective:  More comfortable today less short of breath Heart rate better controlled   Objective   Blood pressure 135/75, pulse (!) 119, temperature 98.2 F (36.8 C), temperature source Axillary, resp. rate (!) 27, height 5\' 11"  (1.803 m), weight 70.9 kg, SpO2 94 %.        Intake/Output Summary (Last 24 hours) at 11/04/2019 0847 Last data filed at 11/04/2019 0600 Gross per 24 hour  Intake 1219.28 ml  Output 750 ml  Net 469.28 ml   Filed Weights   11/02/19 0500 11/03/19 0500 11/04/19  0416  Weight: 72.7 kg 72 kg 70.9 kg    Examination:  General -alert and oriented x3 HEENT-pupils reactive, moist oral mucosa Cardiac -S1-S2 appreciated Chest -decreased air entry at the bases bilaterally Abdomen -bowel sounds appreciated Extremities -2+ edema bilaterally  Chest x-ray 2/1 reviewed by myself showing small pleural effusions bilaterally  Resolved Hospital Problem list     Assessment & Plan:  Acute hypoxic respiratory failure from allergic reaction to obinutuzumab Bilateral pleural effusion s/p thoracentesis on the left -Post thoracentesis for 2 L of fluid -Chest x-ray with improvement, however repeat chest x-ray showing reaccumulation of fluid -Exudative effusion, no organism -May require repeat thoracentesis -Added Lasix today  Fever resolved -Completed antibiotics  Electrolyte derangement -Repleted  CLL/small B-cell lymphoma -Hematology/oncology following  Anemia, thrombocytopenia Continue to monitor -Transfuse per protocol  Steroid-induced hyperglycemia SSI  Tachycardia -Multifactorial -Transition Cardizem to p.o. -May need to add beta-blockade if needed  Continue Mucinex Continue Acapella   MS Contin added for increased work of breathing  Risk of decompensation remains very high   Best practice:  Diet: regular diet DVT prophylaxis: scd GI prophylaxis: ppi Mobility: bed rest Code status: full code Disposition: step down unit  Labs:   CMP Latest Ref Rng & Units 11/04/2019 11/03/2019 11/01/2019  Glucose 70 - 99 mg/dL 126(H) 121(H) 124(H)  BUN 6 - 20 mg/dL 25(H) 23(H) 24(H)  Creatinine 0.61 - 1.24 mg/dL 0.67 0.60(L) 0.46(L)  Sodium 135 - 145 mmol/L 134(L) 135 139  Potassium 3.5 - 5.1 mmol/L  4.3 4.2 3.9  Chloride 98 - 111 mmol/L 96(L) 97(L) 103  CO2 22 - 32 mmol/L 25 26 25   Calcium 8.9 - 10.3 mg/dL 9.2 9.1 9.1  Total Protein 6.5 - 8.1 g/dL - - -  Total Bilirubin 0.3 - 1.2 mg/dL - - -  Alkaline Phos 38 - 126 U/L - - -  AST 15 - 41  U/L - - -  ALT 0 - 44 U/L - - -    CBC Latest Ref Rng & Units 11/04/2019 11/03/2019 11/01/2019  WBC 4.0 - 10.5 K/uL 4.0 6.1 4.0  Hemoglobin 13.0 - 17.0 g/dL 10.4(L) 10.5(L) 9.6(L)  Hematocrit 39.0 - 52.0 % 33.1(L) 33.2(L) 29.9(L)  Platelets 150 - 400 K/uL 115(L) 113(L) 93(L)    ABG    Component Value Date/Time   PHART 7.404 10/29/2019 2140   PCO2ART 42.5 10/29/2019 2140   PO2ART 59.5 (L) 10/29/2019 2140   HCO3 26.0 10/29/2019 2140   ACIDBASEDEF 1.4 10/29/2019 1404   O2SAT 87.4 10/29/2019 2140    CBG (last 3)  Recent Labs    11/03/19 1651 11/03/19 2129 11/04/19 0752  GLUCAP 145* 146* 92   The patient is critically ill with multiple organ systems failure and requires high complexity decision making for assessment and support, frequent evaluation and titration of therapies, application of advanced monitoring technologies and extensive interpretation of multiple databases. Critical Care Time devoted to patient care services described in this note independent of APP/resident time (if applicable)  is 32 minutes.   Sherrilyn Rist MD [facility] Personal pager: [phone] If unanswered, please page CCM On-call: [phone]

## 2019-11-04 NOTE — Progress Notes (Signed)
[facility] Progress Note Patient Name: Isaac Pierce DOB: 1961/02/03 MRN: [id number]   Date of Service  11/04/2019  HPI/Events of Note  Review of CXR reveals bilateral pulmonary effusions and pulmonary congestion. Creatinine = 0.67.  eICU Interventions  Will order: 1. Lasix 40 mg IV X 1 now.      Intervention Category Major Interventions: Other:  Lysle Dingwall 11/04/2019, 11:55 PM

## 2019-11-04 NOTE — Progress Notes (Signed)
Pt complaining of not getting enough oxygen. Pt on HF @ 10L. O2 sat 99%, HR 134, RR 25, BP 138/73 (94). Pt sitting up in bed and very anxious. RN called Respiratory to come and administer Nebulizer tx. RN also alerted E-link to see if there was anything pt could receive for anxiety. Dr. Oletta Darter ordered STAT CXR. RN will continue to monitor the patient closely and await further orders.

## 2019-11-04 NOTE — Progress Notes (Signed)
[facility] Progress Note Patient Name: Abdourahmane Stasi DOB: [birth year] MRN: [id number]   Date of Service  11/04/2019  HPI/Events of Note  SOB/Anxiety - Saat = 97% and RR = 25. BP = 138/73 and HR = 126 (Sinus Tachycardia). Patient getting neb Rx now. Sitting up in bed to breath.  eICU Interventions  Will order: 1. Portable CXR STAT.     Intervention Category Major Interventions: Other:  Lysle Dingwall 11/04/2019, 11:27 PM

## 2019-11-04 NOTE — Progress Notes (Signed)
HEMATOLOGY-ONCOLOGY PROGRESS NOTE  SUBJECTIVE: The patient is sitting up in the recliner chair receiving a nebulizer treatment at the time of visit.  Still has some shortness of breath.  Abdomen feels distended.  He has no other complaints.  Oncology History  Non-Hodgkin lymphoma ([place])  08/25/2019 Initial Diagnosis   Non-Hodgkin lymphoma ([place])   08/25/2019 - 08/31/2019 Chemotherapy   The patient had riTUXimab (RITUXAN) 700 mg in sodium chloride 0.9 % 250 mL (2.1875 mg/mL) infusion, 375 mg/m2 = 700 mg (100 % of original dose 375 mg/m2), Intravenous,  Once, 1 of 1 cycle Dose modification: 375 mg/m2 (original dose 375 mg/m2, Cycle 1) Administration: 700 mg (08/25/2019) riTUXimab-pvvr (RUXIENCE) 700 mg in sodium chloride 0.9 % 250 mL (2.1875 mg/mL) infusion, 375 mg/m2 = 700 mg (100 % of original dose 375 mg/m2), Intravenous,  Once, 0 of 3 cycles Dose modification: 375 mg/m2 (original dose 375 mg/m2, Cycle 2), 375 mg/m2 (original dose 375 mg/m2, Cycle 3)  for chemotherapy treatment.    09/01/2019 - 10/12/2019 Chemotherapy   The patient had DOXOrubicin (ADRIAMYCIN) chemo injection 68 mg, 37.5 mg/m2 = 68 mg (75 % of original dose 50 mg/m2), Intravenous,  Once, 2 of 6 cycles Dose modification: 37.5 mg/m2 (75 % of original dose 50 mg/m2, Cycle 1, Reason: Provider Judgment) Administration: 68 mg (09/01/2019), 68 mg (09/22/2019) palonosetron (ALOXI) injection 0.25 mg, 0.25 mg, Intravenous,  Once, 2 of 6 cycles Administration: 0.25 mg (09/01/2019), 0.25 mg (09/22/2019) pegfilgrastim-cbqv (UDENYCA) injection 6 mg, 6 mg, Subcutaneous, Once, 1 of 5 cycles Administration: 6 mg (09/23/2019) vinCRIStine (ONCOVIN) 1.5 mg in sodium chloride 0.9 % 50 mL chemo infusion, 1.5 mg (75 % of original dose 2 mg), Intravenous,  Once, 2 of 6 cycles Dose modification: 1.5 mg (75 % of original dose 2 mg, Cycle 1, Reason: Provider Judgment) Administration: 1.5 mg (09/01/2019), 1.5 mg (09/22/2019) riTUXimab (RITUXAN) 700 mg in  sodium chloride 0.9 % 250 mL (2.1875 mg/mL) infusion, 375 mg/m2 = 700 mg (100 % of original dose 375 mg/m2), Intravenous,  Once, 1 of 1 cycle Dose modification: 375 mg/m2 (original dose 375 mg/m2, Cycle 1) Administration: 700 mg (09/01/2019) cyclophosphamide (CYTOXAN) 1,020 mg in sodium chloride 0.9 % 250 mL chemo infusion, 562.5 mg/m2 = 1,020 mg (75 % of original dose 750 mg/m2), Intravenous,  Once, 2 of 6 cycles Dose modification: 562.5 mg/m2 (75 % of original dose 750 mg/m2, Cycle 1, Reason: Provider Judgment) Administration: 1,020 mg (09/01/2019), 1,020 mg (09/22/2019) fosaprepitant (EMEND) 150 mg, dexamethasone (DECADRON) 12 mg in sodium chloride 0.9 % 145 mL IVPB, , Intravenous,  Once, 2 of 6 cycles Administration:  (09/01/2019),  (09/22/2019) riTUXimab-pvvr (RUXIENCE) 700 mg in sodium chloride 0.9 % 250 mL (2.1875 mg/mL) infusion, 375 mg/m2 = 700 mg, Intravenous,  Once, 1 of 1 cycle  for chemotherapy treatment.    10/22/2019 - 10/29/2019 Chemotherapy   The patient had obinutuzumab (GAZYVA) 100 mg in sodium chloride 0.9 % 100 mL (0.9615 mg/mL) chemo infusion, 100 mg, Intravenous, Once, 1 of 6 cycles Administration: 100 mg (10/22/2019), 1,000 mg (10/29/2019)  for chemotherapy treatment.       REVIEW OF SYSTEMS:   Noncontributory except as noted in the HPI.  PHYSICAL EXAMINATION: ECOG PERFORMANCE STATUS: 1 - Symptomatic but completely ambulatory  Vitals:   11/04/19 0935 11/04/19 1000  BP: (!) 153/78 129/73  Pulse:  (!) 105  Resp:  15  Temp:    SpO2:  97%   Filed Weights   11/02/19 0500 11/03/19 0500 11/04/19 0416  Weight: 160 lb 4.4 oz (72.7 kg) 158 lb 11.7 oz (72 kg) 156 lb 4.9 oz (70.9 kg)    Intake/Output from previous day: 02/03 0701 - 02/04 0700 In: 1219.3 [P.O.:840; I.V.:329.3; IV Piggyback:50] Out: 750 [Urine:750]  GENERAL:alert, no distress, appears short of breath at times when talking LYMPH: Persistent left axillary lymphadenopathy LUNGS: Diminished breath sounds  to the left base, otherwise clear HEART: Tachycardic, 2+ bilateral lower extremity edema ABDOMEN: Positive bowel sounds, soft, nontender Musculoskeletal:no cyanosis of digits and no clubbing  NEURO: alert & oriented x 3 with fluent speech, no focal motor/sensory deficits  LABORATORY DATA:  I have reviewed the data as listed CMP Latest Ref Rng & Units 11/04/2019 11/03/2019 11/01/2019  Glucose 70 - 99 mg/dL 126(H) 121(H) 124(H)  BUN 6 - 20 mg/dL 25(H) 23(H) 24(H)  Creatinine 0.61 - 1.24 mg/dL 0.67 0.60(L) 0.46(L)  Sodium 135 - 145 mmol/L 134(L) 135 139  Potassium 3.5 - 5.1 mmol/L 4.3 4.2 3.9  Chloride 98 - 111 mmol/L 96(L) 97(L) 103  CO2 22 - 32 mmol/L 25 26 25   Calcium 8.9 - 10.3 mg/dL 9.2 9.1 9.1  Total Protein 6.5 - 8.1 g/dL - - -  Total Bilirubin 0.3 - 1.2 mg/dL - - -  Alkaline Phos 38 - 126 U/L - - -  AST 15 - 41 U/L - - -  ALT 0 - 44 U/L - - -    Lab Results  Component Value Date   WBC 4.0 11/04/2019   HGB 10.4 (L) 11/04/2019   HCT 33.1 (L) 11/04/2019   MCV 92.5 11/04/2019   PLT 115 (L) 11/04/2019   NEUTROABS 3.5 11/04/2019    CT CHEST WO CONTRAST  Result Date: 10/29/2019 CLINICAL DATA:  With lymphoma, abnormal radiograph EXAM: CT CHEST WITHOUT CONTRAST TECHNIQUE: Multidetector CT imaging of the chest was performed following the standard protocol without IV contrast. COMPARISON:  Radiograph 10/28/2018, CT 08/25/2019 FINDINGS: Cardiovascular: Left IJ approach Port-A-Cath is accessed with the tip positioned at the right atrium. Normal cardiac size. Redemonstration of the extensive pericardial soft tissue attenuation lesions these appear to extend more superiorly towards the cardiac base than on comparison study including a larger crescentic amount of soft tissue measuring approximately 6.9 cm anteroposterior and 1.7 cm in thickness near the level of the pulmonary trunk (2/79). Central pulmonary arteries are normal caliber. Atherosclerotic plaque within the normal caliber aorta. Normal  3 vessel branching of the aortic arch with minimal plaque in the great vessels. Mediastinum/Nodes: Decrease in the size of bulky thoracic lymphadenopathy compatible with patient's known lymphoproliferative disorder. Index lymph nodes include: *A high right paratracheal node measuring 12 mm short axis (2/45), previously 23 mm *AP window lymph node measuring 24 mm (4/35), previously 29 mm *Right hilar node measuring 15 mm (2/85), previously 21 mm *Subcarinal node measuring 18 mm (2/69), previously 26 mm *Right inter lobar lymph node on comparison is difficult to assess given absence of contrast media. Thyroid gland is unremarkable. No acute abnormality of the trachea or esophagus. Lungs/Pleura: Large bilateral pleural effusions are noted. Dense bandlike areas of atelectasis are noted in both lungs. Multiple nodules are seen in the right middle and lower lobe examples include a 8 mm nodule laterally (4/102) in lower lobe and a subpleural 6 mm nodule anteriorly in the right middle lobe (4/98). No visible nodules seen in the left lung though underlying atelectasis could obscure the presence of nodules or masses. Upper Abdomen: Marked splenomegaly. Some wedge-shaped hypoattenuation in the periphery of the  spleen measuring 1.6 x 3.2 cm, compatible with sequela of remote splenic infarct. Indeterminate exophytic 12 mm lesion arising from the upper pole right kidney. Musculoskeletal: Multilevel degenerative changes are present in the imaged portions of the spine. Evaluation of the chest wall limited given extensive motion artifact which may limit detection of sternal and rib fractures. No gross acute osseous abnormality or suspicious osseous lesions. IMPRESSION: 1. Mixed response of patient's lymphoproliferative disease with overall decrease in the size of bulky thoracic lymphadenopathy but with increasing pericardial soft tissue masses and multiple new lung nodules. 2. Large bilateral pleural effusions with associated  compressive atelectasis. 3. 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 4. Indeterminate 12 mm lesion arising from the upper pole of the right kidney. Indeterminate on noncontrast CT. Could consider follow-up with nonemergent outpatient MR or CT. 5. Evaluation of the chest wall limited given extensive motion artifact which may limit detection of sternal and rib fractures. No gross acute osseous abnormality. 6.  Aortic Atherosclerosis (ICD10-I70.0). Electronically Signed   By: Lovena Le M.D.   On: 10/29/2019 20:36   DG Chest Port 1 View  Result Date: 11/04/2019 CLINICAL DATA:  Follow-up pleural effusions. EXAM: PORTABLE CHEST 1 VIEW COMPARISON:  Multiple previous chest films. The most recent is 11/01/2019 FINDINGS: The right IJ power port is stable. Persistent moderate-sized bilateral pleural effusions with significant overlying atelectasis. Central vascular congestion and possible mild perihilar interstitial edema. IMPRESSION: Persistent moderate-sized bilateral pleural effusions with significant overlying atelectasis. Electronically Signed   By: Marijo Sanes M.D.   On: 11/04/2019 06:14   DG CHEST PORT 1 VIEW  Result Date: 11/01/2019 CLINICAL DATA:  Status post thoracentesis EXAM: PORTABLE CHEST 1 VIEW COMPARISON:  10/31/19 FINDINGS: Cardiac shadow is stable. Right chest wall port is noted. Small pleural effusions are noted bilaterally with basilar atelectasis. No pneumothorax is seen. IMPRESSION: Small pleural effusions bilaterally with basilar atelectasis. No pneumothorax is identified. Electronically Signed   By: Inez Catalina M.D.   On: 11/01/2019 01:48   DG Chest Port 1 View  Result Date: 10/31/2019 CLINICAL DATA:  Status post left thoracentesis. EXAM: PORTABLE CHEST 1 VIEW COMPARISON:  October 31, 2019 FINDINGS: The left-sided pleural effusion is smaller after thoracentesis. No pneumothorax. Bilateral pleural effusions remain. The right Port-A-Cath is stable. No other interval  changes. IMPRESSION: The left-sided pleural effusion is smaller after thoracentesis. No pneumothorax. No other changes. Electronically Signed   By: Dorise Bullion III M.D   On: 10/31/2019 10:29   DG Chest Port 1 View  Result Date: 10/31/2019 CLINICAL DATA:  Follow-up pleural effusion EXAM: PORTABLE CHEST 1 VIEW COMPARISON:  10/30/2019 FINDINGS: Power port unchanged. Bilateral effusions, larger on the left than the right. Volume loss in the lower lungs, worse on the left than the right. No new radiographic finding. IMPRESSION: No change. Large bilateral effusions left more than right with dependent volume loss left more than right. Electronically Signed   By: Nelson Chimes M.D.   On: 10/31/2019 06:00   DG Chest Port 1 View  Result Date: 10/30/2019 CLINICAL DATA:  Respiratory failure. EXAM: PORTABLE CHEST 1 VIEW COMPARISON:  Chest radiograph 10/29/2019 FINDINGS: Right anterior chest wall Port-A-Cath is present with tip projecting over the superior vena cava. Monitoring leads overlie the patient. Stable enlarged cardiac and mediastinal contours. Persistent moderate to large layering bilateral pleural effusions with underlying pulmonary consolidation. No pneumothorax. IMPRESSION: Persistent moderate to large layering bilateral effusions with underlying consolidation. Electronically Signed   By: Polly Cobia.D.  On: 10/30/2019 04:58   DG Chest Port 1 View  Result Date: 10/29/2019 CLINICAL DATA:  Shortness of breath EXAM: PORTABLE CHEST 1 VIEW COMPARISON:  09/01/2019, CT 08/25/2019 FINDINGS: Right-sided central venous port tip over the cavoatrial region allowing for rotated patient. Suspected moderate layering right pleural effusion, probably increased from prior radiograph. Moderate left pleural effusion, also suspect increase in size. Enlarged cardiomediastinal silhouette with vascular congestion. Consolidation at the left lung base. Fluid in the right fissure. Hazy appearance of both thoraces likely due  to layering fluid although underlying pulmonary edema could be contributing. No pneumothorax. IMPRESSION: Hazy appearance of the bilateral lungs since 09/01/2019, suspected to be secondary to moderate layering pleural effusions, probably increased in the interim. Cardiomegaly with vascular congestion; hazy lung appearance could also be due to associated edema. Atelectasis or pneumonia at the left base. Electronically Signed   By: Donavan Foil M.D.   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  He had bulky lymphadenopathy in the chest and abdomen as well as hepatosplenomegaly and involvement of the bone marrow.  He also had significant pancytopenia on presentation.  He was initially tried on single agent Rituxan but this was discontinued secondary to hypersensitivity reaction.  He received 2 cycles of CHOP, but did not respond well.  His chemotherapy was switched to Obinutuzumab and acalabrutinib.  He had an infusion reaction to obinutuzumab and is now hospitalized.  Status post thoracentesis and cytology is pending.  Breathing appears stable, chest x-ray shows persistent moderate sized bilateral pleural effusions.  He may need a repeat thoracentesis.  Our pharmacy at the cancer center is arranging for his Calquence to be delivered to our office today.  We will then give this to the inpatient pharmacy for administration.  Hopefully, this well provide better control of his disease.  We will follow up on cytology when it becomes available.   LOS: 6 days   Mikey Bussing, DNP, AGPCNP-BC, AOCNP 11/04/19

## 2019-11-04 NOTE — Telephone Encounter (Signed)
Oral Chemotherapy Pharmacist Encounter   Calquence arrived this morning. Medication was delivered to [facility]. Spoke with Pharmacy staff member Megan to let her know medication was for admitted patient Linsey Lombardi. She said she would alert the floor pharmacist. Informed Dr. Julien Nordmann of the medication arrival. He plans on putting in an order for the medication.  Darl Pikes, PharmD, BCPS, [facility] Hematology/Oncology Clinical Pharmacist [facility] [phone]  11/04/2019 3:05 PM

## 2019-11-05 ENCOUNTER — Inpatient Hospital Stay (HOSPITAL_COMMUNITY): Payer: Medicaid Other

## 2019-11-05 ENCOUNTER — Telehealth: Payer: Self-pay | Admitting: Medical Oncology

## 2019-11-05 LAB — GLUCOSE, CAPILLARY
Glucose-Capillary: 93 mg/dL (ref 70–99)
Glucose-Capillary: 107 mg/dL — ABNORMAL HIGH (ref 70–99)
Glucose-Capillary: 111 mg/dL — ABNORMAL HIGH (ref 70–99)
Glucose-Capillary: 215 mg/dL — ABNORMAL HIGH (ref 70–99)

## 2019-11-05 NOTE — Procedures (Addendum)
Thoracentesis Procedure Note  Pre-operative Diagnosis: Large right pleural effusion  Post-operative Diagnosis: same  Indications: Large right pleural effusion  Procedure Details  Consent: Informed consent was obtained. Risks of the procedure were discussed including: infection, bleeding, pain, pneumothorax.  Ultrasound guidance was used to localize site of fluid, site was marked  Under sterile conditions the patient was positioned. Betadine solution and sterile drapes were utilized.  1% plain lidocaine was used to anesthetize the 8 rib space. Fluid was obtained without any difficulties and minimal blood loss.  A dressing was applied to the wound and wound care instructions were provided.   Findings 1300 ml of clear pleural fluid was obtained. Complications:  None; patient tolerated the procedure well.          Condition: stable  Plan A follow up chest x-ray was ordered. Bed Rest for 0 hours. Tylenol 650 mg. for pain.  Attending Attestation: I performed the procedure.

## 2019-11-05 NOTE — Progress Notes (Signed)
NAME:  Isaac Pierce, MRN:  [id number], DOB:  21-Nov-1960, LOS: 7 ADMISSION DATE:  10/29/2019, CONSULTATION DATE:  10/29/2019  REFERRING MD:  Dr Francia Greaves of [facility], CHIEF COMPLAINT:  Acute resp failure following Mab infussion   Brief History   59 yo male with hx of CLL/small B cell lymphoma failed CHOP, and started obinutuzumab with cycle 1 on January 22.  Received PRBC transfusion January 28.  On January 29 was started on cycle 2 of CD20 antibody, and then developed stridor with diaphoresis and respiratory distress.  Treated with steroids and epinephrine in ER, and required Bipap. Had thoracentesis with 2 L on 10/31/19 Repeat right-sided thoracentesis 11/05/2019  Past Medical History  GERD, Anxiety, Back pain  Significant Hospital Events   1/29 admit 1/30 transfuse 2 units PRBC 1/31 thoracentesis 2/3-right-sided thoracentesis Consults:  Oncology  Procedures:  Thoracentesis 1/31-2 L of serous fluid Thoracentesis 2/5-1.3 L of clear serous fluid  Significant Diagnostic Tests:  CT chest 1/29 >> decreased size of thoracic LN, increased size of pericardial soft tissue masses and multiple new lung nodules, large b/l effusions, splenomegaly Lt thoracentesis 1/31 >>  Chest x-ray shows bilateral pleural effusion-2/4  Micro Data:  SARS CoV2 PCR 1/29 >> negative Influenza PCR 1/29 >> A and B negative Blood 1/29 >> RVP 1/29 >> negative Urine 1/29 >> negative Pneumococcal Ag 1/29 >> negative Legionella Ag 1/29 >> negative Lt pleural fluid 1/31-no organism  Antimicrobials:  Vancomycin 1/29  Cefepime 1/29-2/2  Interim history/subjective:  More comfortable today less short of breath Heart rate better, diuresing well   Objective   Blood pressure 135/73, pulse (!) 103, temperature 98 F (36.7 C), temperature source Axillary, resp. rate 20, height 5\' 11"  (1.803 m), weight 67.7 kg, SpO2 99 %.        Intake/Output Summary (Last 24 hours) at 11/05/2019 0950 Last data filed at 11/05/2019  0700 Gross per 24 hour  Intake 75.24 ml  Output 2100 ml  Net -2024.76 ml   Filed Weights   11/03/19 0500 11/04/19 0416 11/05/19 0500  Weight: 72 kg 70.9 kg 67.7 kg    Examination:  General -alert and oriented x3 HEENT-pupils reactive, moist oral mucosa Cardiac -S1-S2 appreciated Chest -creased breath sounds bilaterally Abdomen -bowel sounds appreciated Extremities -2+ edema bilaterally  Chest x-ray 2/4 reviewed by myself showing bilateral pleural effusions  Resolved Hospital Problem list     Assessment & Plan:  Acute hypoxic respiratory failure from allergic reaction to obinutuzumab Bilateral pleural effusion status post thoracentesis on the right -Post thoracentesis with 2 L of fluid -Repeat thoracentesis today with 1.3 L of fluid drained from the right pleural space -Chest x-ray pending -Breathing feels better -Diuresing well  Fever resolved -Completed antibiotics  CLL/B cell lymphoma -Hematology oncology following Anemia thrombocytopenia Continue to monitor -Transfuse per protocol Anemia, thrombocytopenia Continue to monitor -Transfuse per protocol  Sinus tachycardia better controlled -On p.o. Cardizem  Continue Mucinex Continue flutter valve use  Breathing appears to be more stable He is diuresing well Will discontinue MS contin   Best practice:  Diet: regular diet DVT prophylaxis: scd GI prophylaxis: ppi Mobility: bed rest Code status: full code Disposition: step down unit  Follow chest x-ray May require left-sided thoracentesis  Labs:   CMP Latest Ref Rng & Units 11/04/2019 11/03/2019 11/01/2019  Glucose 70 - 99 mg/dL 126(H) 121(H) 124(H)  BUN 6 - 20 mg/dL 25(H) 23(H) 24(H)  Creatinine 0.61 - 1.24 mg/dL 0.67 0.60(L) 0.46(L)  Sodium 135 - 145 mmol/L 134(L)  135 139  Potassium 3.5 - 5.1 mmol/L 4.3 4.2 3.9  Chloride 98 - 111 mmol/L 96(L) 97(L) 103  CO2 22 - 32 mmol/L 25 26 25   Calcium 8.9 - 10.3 mg/dL 9.2 9.1 9.1  Total Protein 6.5 - 8.1 g/dL  - - -  Total Bilirubin 0.3 - 1.2 mg/dL - - -  Alkaline Phos 38 - 126 U/L - - -  AST 15 - 41 U/L - - -  ALT 0 - 44 U/L - - -    CBC Latest Ref Rng & Units 11/04/2019 11/03/2019 11/01/2019  WBC 4.0 - 10.5 K/uL 4.0 6.1 4.0  Hemoglobin 13.0 - 17.0 g/dL 10.4(L) 10.5(L) 9.6(L)  Hematocrit 39.0 - 52.0 % 33.1(L) 33.2(L) 29.9(L)  Platelets 150 - 400 K/uL 115(L) 113(L) 93(L)    ABG    Component Value Date/Time   PHART 7.404 10/29/2019 2140   PCO2ART 42.5 10/29/2019 2140   PO2ART 59.5 (L) 10/29/2019 2140   HCO3 26.0 10/29/2019 2140   ACIDBASEDEF 1.4 10/29/2019 1404   O2SAT 87.4 10/29/2019 2140    CBG (last 3)  Recent Labs    11/04/19 1701 11/04/19 2137 11/05/19 0751  GLUCAP 97 134* 93   The patient is critically ill with multiple organ systems failure and requires high complexity decision making for assessment and support, frequent evaluation and titration of therapies, application of advanced monitoring technologies and extensive interpretation of multiple databases. Critical Care Time devoted to patient care services described in this note independent of APP/resident time (if applicable)  is 30 minutes.   Sherrilyn Rist MD [facility] Personal pager: [phone] If unanswered, please page CCM On-call: [phone]

## 2019-11-05 NOTE — Telephone Encounter (Signed)
LVM for  Isaac Pierce that chemotherapy tablet is scheduled to be started in the hospital today.

## 2019-11-06 ENCOUNTER — Inpatient Hospital Stay (HOSPITAL_COMMUNITY): Payer: Medicaid Other

## 2019-11-06 LAB — BODY FLUID CELL COUNT WITH DIFFERENTIAL
Lymphs, Fluid: 28 %
Monocyte-Macrophage-Serous Fluid: 57 % (ref 50–90)
Neutrophil Count, Fluid: 15 % (ref 0–25)
Total Nucleated Cell Count, Fluid: 18664 cu mm — ABNORMAL HIGH (ref 0–1000)

## 2019-11-06 LAB — GLUCOSE, PLEURAL OR PERITONEAL FLUID: Glucose, Fluid: 64 mg/dL

## 2019-11-06 LAB — GLUCOSE, CAPILLARY
Glucose-Capillary: 123 mg/dL — ABNORMAL HIGH (ref 70–99)
Glucose-Capillary: 213 mg/dL — ABNORMAL HIGH (ref 70–99)
Glucose-Capillary: 179 mg/dL — ABNORMAL HIGH (ref 70–99)

## 2019-11-06 LAB — PROTEIN, PLEURAL OR PERITONEAL FLUID: Total protein, fluid: <3 g/dL

## 2019-11-06 LAB — LACTATE DEHYDROGENASE, PLEURAL OR PERITONEAL FLUID: LD, Fluid: 1646 U/L — ABNORMAL HIGH (ref 3–23)

## 2019-11-06 NOTE — Progress Notes (Signed)
Patient still refusing mouth care but has been compliant with all other medications and treatments. I spoke to patients sister, who is pt's healthcare proxy, and she says that she has still not received a call from oncologist concerning the plan of care for her brother moving forward. I had a long conversation with her about recent tests, lab work, and the thoracentesis he had today. He also has stated that he does not want to be intubated if it came down to that. PT isn't willing to talk about things further but I suggested the sister speak to him about it tomorrow while she visits. Pt's sister did not know that there was such thing as a partial code status. She seems to be onboard with following whatever wishes her brother has. I will speak to the dayshift nurse about asking oncology to call patients sister if they round on the patient tomorrow. Will continue to assess for change in status and condition.

## 2019-11-06 NOTE — Progress Notes (Signed)
NAME:  Isaac Pierce, MRN:  [id number], DOB:  [birth year], LOS: 8 ADMISSION DATE:  10/29/2019, CONSULTATION DATE:  10/29/2019  REFERRING MD:  Dr Francia Greaves of [facility], CHIEF COMPLAINT:  Acute resp failure following Mab infussion   Brief History   59 yo male with hx of CLL/small B cell lymphoma failed CHOP, and started obinutuzumab with cycle 1 on January 22.  Received PRBC transfusion January 28.  On January 29 was started on cycle 2 of CD20 antibody, and then developed stridor with diaphoresis and respiratory distress.  Treated with steroids and epinephrine in ER, and required Bipap. Had thoracentesis with 2 L on 10/31/19 Repeat right-sided thoracentesis 11/05/2019  Past Medical History  GERD, Anxiety, Back pain   Significant Hospital Events   1/29 admit 1/30 transfuse 2 units PRBC 1/31 thoracentesis 2/5-right-sided thoracentesis 2/6-right-sided thoracentesis Consults:  Oncology  Procedures:  Thoracentesis 1/31-2 L of serous fluid Thoracentesis 2/5-1.3 L of clear serous fluid Thoracentesis 2/6-1.4 L of cloudy amber-colored fluid  Significant Diagnostic Tests:  CT chest 1/29 >> decreased size of thoracic LN, increased size of pericardial soft tissue masses and multiple new lung nodules, large b/l effusions, splenomegaly Lt thoracentesis 1/31 >>  Chest x-ray shows bilateral pleural effusion-2/4  Post bilateral thoracentesis-chest x-ray reveals improvement in effusion  Micro Data:  SARS CoV2 PCR 1/29 >> negative Influenza PCR 1/29 >> A and B negative Blood 1/29 >> RVP 1/29 >> negative Urine 1/29 >> negative Pneumococcal Ag 1/29 >> negative Legionella Ag 1/29 >> negative Lt pleural fluid 1/31-no organism  Antimicrobials:  Vancomycin 1/29  Cefepime 1/29-2/2  Interim history/subjective:  More comfortable, less short of breath  Objective   Blood pressure (!) 102/46, pulse (!) 105, temperature (!) 97.5 F (36.4 C), temperature source Oral, resp. rate (!) 22, height 5\' 11"  (1.803  m), weight 67.8 kg, SpO2 98 %.        Intake/Output Summary (Last 24 hours) at 11/06/2019 1200 Last data filed at 11/06/2019 0900 Gross per 24 hour  Intake 480 ml  Output 3950 ml  Net -3470 ml   Filed Weights   11/04/19 0416 11/05/19 0500 11/06/19 0500  Weight: 70.9 kg 67.7 kg 67.8 kg    Examination:  General -alert and oriented x3 HEENT-pupils reactive, moist oral mucosa Cardiac -S1-S2 appreciated Chest -improved air entry bilaterally Abdomen -bowel sounds appreciated Extremities -2+ edema bilaterally  Chest x-ray 2/6 shows improvement in pleural effusions  Resolved Hospital Problem list     Assessment & Plan:  Acute hypoxic respiratory failure from allergic reaction to obinutuzumab Bilateral pleural effusion s/p thoracentesis bilaterally -Continue diuresis -Thoracentesis as needed  Fever resolved -Completed antibiotics  CLL/B-cell lymphoma -Hematology oncology following -Transfusion per protocol for anemia  Sinus tachycardia better controlled on p.o. Cardizem  We will continue Mucinex Continue flutter valve  Breathing appears more stable Diuresing well  Follow-up fluid analysis from right thoracentesis  Best practice:  Diet: regular diet DVT prophylaxis: scd GI prophylaxis: ppi Mobility: bed rest Code status: full code Disposition: step down unit  Follow chest x-ray May require left-sided thoracentesis  Labs:   CMP Latest Ref Rng & Units 11/04/2019 11/03/2019 11/01/2019  Glucose 70 - 99 mg/dL 126(H) 121(H) 124(H)  BUN 6 - 20 mg/dL 25(H) 23(H) 24(H)  Creatinine 0.61 - 1.24 mg/dL 0.67 0.60(L) 0.46(L)  Sodium 135 - 145 mmol/L 134(L) 135 139  Potassium 3.5 - 5.1 mmol/L 4.3 4.2 3.9  Chloride 98 - 111 mmol/L 96(L) 97(L) 103  CO2 22 - 32 mmol/L 25  26 25  Calcium 8.9 - 10.3 mg/dL 9.2 9.1 9.1  Total Protein 6.5 - 8.1 g/dL - - -  Total Bilirubin 0.3 - 1.2 mg/dL - - -  Alkaline Phos 38 - 126 U/L - - -  AST 15 - 41 U/L - - -  ALT 0 - 44 U/L - - -    CBC  Latest Ref Rng & Units 11/04/2019 11/03/2019 11/01/2019  WBC 4.0 - 10.5 K/uL 4.0 6.1 4.0  Hemoglobin 13.0 - 17.0 g/dL 10.4(L) 10.5(L) 9.6(L)  Hematocrit 39.0 - 52.0 % 33.1(L) 33.2(L) 29.9(L)  Platelets 150 - 400 K/uL 115(L) 113(L) 93(L)    ABG    Component Value Date/Time   PHART 7.404 10/29/2019 2140   PCO2ART 42.5 10/29/2019 2140   PO2ART 59.5 (L) 10/29/2019 2140   HCO3 26.0 10/29/2019 2140   ACIDBASEDEF 1.4 10/29/2019 1404   O2SAT 87.4 10/29/2019 2140    CBG (last 3)  Recent Labs    11/05/19 1635 11/05/19 2201 11/06/19 0820  GLUCAP 111* 215* 123*   The patient is critically ill with multiple organ systems failure and requires high complexity decision making for assessment and support, frequent evaluation and titration of therapies, application of advanced monitoring technologies and extensive interpretation of multiple databases. Critical Care Time devoted to patient care services described in this note independent of APP/resident time (if applicable)  is 30 minutes.   Sherrilyn Rist MD [facility] Personal pager: [phone] If unanswered, please page CCM On-call: [phone]

## 2019-11-06 NOTE — Progress Notes (Signed)
Pt was sitting up in bed alert and awake when I arrived. He said he was doing good and pointed to some mail he said he had rec'd from his father saying he would like to focus on that for now. He was very appreciative of the visit. Please page for assistance if needed prior to a follow-up visit. [place]   11/06/19 1800  Clinical Encounter Type  Visited With Patient

## 2019-11-06 NOTE — Procedures (Signed)
Thoracentesis Procedure Note  Pre-operative Diagnosis: Large right pleural effusion  Post-operative Diagnosis: same  Indications: Large right pleural effusion  Procedure Details  Consent: Informed consent was obtained. Risks of the procedure were discussed including: infection, bleeding, pain, pneumothorax.  Under sterile conditions the patient was positioned. Betadine solution and sterile drapes were utilized.  1% plain lidocaine was used to anesthetize the seventh rib space. Fluid was obtained without any difficulties and minimal blood loss.  A dressing was applied to the wound and wound care instructions were provided.   Findings 1400 ml of cloudy pleural fluid was obtained. A sample was sent to Pathology for cytogenetics, flow, and cell counts, as well as for infection analysis.  Complications:  None; patient tolerated the procedure well.          Condition: stable  Plan A follow up chest x-ray was ordered. Bed Rest for 0 hours. Tylenol 650 mg. for pain.  Attending Attestation: I performed the procedure.

## 2019-11-07 LAB — GLUCOSE, CAPILLARY
Glucose-Capillary: 166 mg/dL — ABNORMAL HIGH (ref 70–99)
Glucose-Capillary: 113 mg/dL — ABNORMAL HIGH (ref 70–99)
Glucose-Capillary: 97 mg/dL (ref 70–99)
Glucose-Capillary: 137 mg/dL — ABNORMAL HIGH (ref 70–99)

## 2019-11-07 LAB — GRAM STAIN

## 2019-11-07 MED ORDER — MENTHOL 3 MG MT LOZG
1.0000 | LOZENGE | OROMUCOSAL | Status: DC | PRN
  Filled 2019-11-07: qty 9

## 2019-11-07 MED ORDER — PHENOL 1.4 % MT LIQD
1.0000 | OROMUCOSAL | Status: DC | PRN
  Filled 2019-11-07: qty 177

## 2019-11-07 NOTE — Progress Notes (Signed)
NAME:  Isaac Pierce, MRN:  [id number], DOB:  October 11, 1960, LOS: 9 ADMISSION DATE:  10/29/2019, CONSULTATION DATE:  10/29/2019  REFERRING MD:  Dr Francia Greaves of [facility], CHIEF COMPLAINT:  Acute resp failure following Mab infussion   Brief History   59 yo male with hx of CLL/small B cell lymphoma failed CHOP, and started obinutuzumab with cycle 1 on January 22.  Received PRBC transfusion January 28.  On January 29 was started on cycle 2 of CD20 antibody, and then developed stridor with diaphoresis and respiratory distress.  Treated with steroids and epinephrine in ER, and required Bipap. Had thoracentesis with 2 L on 10/31/19 Repeat right-sided thoracentesis 11/05/2019  Past Medical History  GERD, Anxiety, Back pain   Significant Hospital Events   1/29 admit 1/30 transfuse 2 units PRBC 1/31 thoracentesis 2/5-right-sided thoracentesis 2/6-right-sided thoracentesis Consults:  Oncology  Procedures:  Thoracentesis 1/31-2 L of serous fluid Thoracentesis 2/5-1.3 L of clear serous fluid Thoracentesis 2/6-1.4 L of cloudy amber-colored fluid  Significant Diagnostic Tests:  CT chest 1/29 >> decreased size of thoracic LN, increased size of pericardial soft tissue masses and multiple new lung nodules, large b/l effusions, splenomegaly Lt thoracentesis 1/31 >>  Chest x-ray shows bilateral pleural effusion-2/4  Post bilateral thoracentesis-chest x-ray reveals improvement in effusion  Micro Data:  SARS CoV2 PCR 1/29 >> negative Influenza PCR 1/29 >> A and B negative Blood 1/29 >> RVP 1/29 >> negative Urine 1/29 >> negative Pneumococcal Ag 1/29 >> negative Legionella Ag 1/29 >> negative Lt pleural fluid 1/31-no organism  Antimicrobials:  Vancomycin 1/29  Cefepime 1/29-2/2  Interim history/subjective:  No overnight events Less short of breath  Objective   Blood pressure 119/60, pulse 90, temperature (!) 97.5 F (36.4 C), temperature source Oral, resp. rate 15, height 5\' 11"  (1.803 m), weight  67.8 kg, SpO2 97 %.        Intake/Output Summary (Last 24 hours) at 11/07/2019 1013 Last data filed at 11/07/2019 0856 Gross per 24 hour  Intake 240 ml  Output 2200 ml  Net -1960 ml   Filed Weights   11/04/19 0416 11/05/19 0500 11/06/19 0500  Weight: 70.9 kg 67.7 kg 67.8 kg    Examination:  General -alert and oriented normal, does not need to be in distress  HEENT-moist oral mucosa Cardiac -S1-S2 appreciated Chest -decreased breath sounds at the bases, improved air entry globally Abdomen -bowel sounds appreciated  extremities - 1+ edema  Chest x-ray 2/6 shows improvement in pleural effusions  Resolved Hospital Problem list     Assessment & Plan:  Acute hypoxic respiratory failure from allergic reaction to obinutuzumab bilateral pleural effusion s/p bilateral thoracentesis -Continue diuresis  CLL/B-cell lymphoma -Dermatology oncology following -Transfusion per protocol for anemia  Sinus tachycardia -On p.o. Cardizem  Continue Xanax Continue flutter valve use  Breathing more stable  Pleural fluid cytology pending  Best practice:  Diet: regular diet DVT prophylaxis: scd GI prophylaxis: ppi Mobility: bed rest Code status: full code Disposition: step down unit  Labs:   CMP Latest Ref Rng & Units 11/04/2019 11/03/2019 11/01/2019  Glucose 70 - 99 mg/dL 126(H) 121(H) 124(H)  BUN 6 - 20 mg/dL 25(H) 23(H) 24(H)  Creatinine 0.61 - 1.24 mg/dL 0.67 0.60(L) 0.46(L)  Sodium 135 - 145 mmol/L 134(L) 135 139  Potassium 3.5 - 5.1 mmol/L 4.3 4.2 3.9  Chloride 98 - 111 mmol/L 96(L) 97(L) 103  CO2 22 - 32 mmol/L 25 26 25   Calcium 8.9 - 10.3 mg/dL 9.2 9.1 9.1  Total  Protein 6.5 - 8.1 g/dL - - -  Total Bilirubin 0.3 - 1.2 mg/dL - - -  Alkaline Phos 38 - 126 U/L - - -  AST 15 - 41 U/L - - -  ALT 0 - 44 U/L - - -    CBC Latest Ref Rng & Units 11/04/2019 11/03/2019 11/01/2019  WBC 4.0 - 10.5 K/uL 4.0 6.1 4.0  Hemoglobin 13.0 - 17.0 g/dL 10.4(L) 10.5(L) 9.6(L)  Hematocrit 39.0 - 52.0  % 33.1(L) 33.2(L) 29.9(L)  Platelets 150 - 400 K/uL 115(L) 113(L) 93(L)    ABG    Component Value Date/Time   PHART 7.404 10/29/2019 2140   PCO2ART 42.5 10/29/2019 2140   PO2ART 59.5 (L) 10/29/2019 2140   HCO3 26.0 10/29/2019 2140   ACIDBASEDEF 1.4 10/29/2019 1404   O2SAT 87.4 10/29/2019 2140    CBG (last 3)  Recent Labs    11/06/19 1614 11/06/19 2124 11/07/19 0807  GLUCAP 179* 166* 113*   The patient is critically ill with multiple organ systems failure and requires high complexity decision making for assessment and support, frequent evaluation and titration of therapies, application of advanced monitoring technologies and extensive interpretation of multiple databases. Critical Care Time devoted to patient care services described in this note independent of APP/resident time (if applicable)  is 30 minutes.   Sherrilyn Rist MD [facility] Personal pager: [phone] If unanswered, please page CCM On-call: [phone]

## 2019-11-08 ENCOUNTER — Telehealth: Payer: Self-pay | Admitting: Critical Care Medicine

## 2019-11-08 ENCOUNTER — Inpatient Hospital Stay (HOSPITAL_COMMUNITY): Payer: Medicaid Other

## 2019-11-08 DIAGNOSIS — C8308 Small cell B-cell lymphoma, lymph nodes of multiple sites: Secondary | ICD-10-CM

## 2019-11-08 DIAGNOSIS — M7989 Other specified soft tissue disorders: Secondary | ICD-10-CM

## 2019-11-08 DIAGNOSIS — J90 Pleural effusion, not elsewhere classified: Secondary | ICD-10-CM | POA: Diagnosis present

## 2019-11-08 LAB — URIC ACID: Uric Acid, Serum: 4.5 mg/dL (ref 3.7–8.6)

## 2019-11-08 LAB — BASIC METABOLIC PANEL
Anion gap: 15 (ref 5–15)
BUN: 26 mg/dL — ABNORMAL HIGH (ref 6–20)
CO2: 30 mmol/L (ref 22–32)
Calcium: 9.1 mg/dL (ref 8.9–10.3)
Chloride: 89 mmol/L — ABNORMAL LOW (ref 98–111)
Creatinine, Ser: 0.58 mg/dL — ABNORMAL LOW (ref 0.61–1.24)
GFR calc Af Amer: >60 mL/min (ref >60)
GFR calc non Af Amer: >60 mL/min (ref >60)
Glucose, Bld: 204 mg/dL — ABNORMAL HIGH (ref 70–99)
Potassium: 4.1 mmol/L (ref 3.5–5.1)
Sodium: 134 mmol/L — ABNORMAL LOW (ref 135–145)

## 2019-11-08 LAB — GLUCOSE, CAPILLARY
Glucose-Capillary: 130 mg/dL — ABNORMAL HIGH (ref 70–99)
Glucose-Capillary: 142 mg/dL — ABNORMAL HIGH (ref 70–99)
Glucose-Capillary: 153 mg/dL — ABNORMAL HIGH (ref 70–99)
Glucose-Capillary: 154 mg/dL — ABNORMAL HIGH (ref 70–99)
Glucose-Capillary: 193 mg/dL — ABNORMAL HIGH (ref 70–99)
Glucose-Capillary: 81 mg/dL (ref 70–99)

## 2019-11-08 LAB — CBC WITH DIFFERENTIAL/PLATELET
Abs Immature Granulocytes: 0.05 10*3/uL (ref 0.00–0.07)
Basophils Absolute: 0 10*3/uL (ref 0.0–0.1)
Basophils Relative: 0 %
Eosinophils Absolute: 0 10*3/uL (ref 0.0–0.5)
Eosinophils Relative: 0 %
HCT: 30.3 % — ABNORMAL LOW (ref 39.0–52.0)
Hemoglobin: 9.7 g/dL — ABNORMAL LOW (ref 13.0–17.0)
Immature Granulocytes: 2 %
Lymphocytes Relative: 9 %
Lymphs Abs: 0.3 10*3/uL — ABNORMAL LOW (ref 0.7–4.0)
MCH: 29 pg (ref 26.0–34.0)
MCHC: 32 g/dL (ref 30.0–36.0)
MCV: 90.4 fL (ref 80.0–100.0)
Monocytes Absolute: 0.2 10*3/uL (ref 0.1–1.0)
Monocytes Relative: 9 %
Neutro Abs: 2.2 10*3/uL (ref 1.7–7.7)
Neutrophils Relative %: 80 %
Platelets: 80 10*3/uL — ABNORMAL LOW (ref 150–400)
RBC: 3.35 MIL/uL — ABNORMAL LOW (ref 4.22–5.81)
RDW: 16.7 % — ABNORMAL HIGH (ref 11.5–15.5)
WBC: 2.8 10*3/uL — ABNORMAL LOW (ref 4.0–10.5)
nRBC: 0 % (ref 0.0–0.2)

## 2019-11-08 LAB — PROTIME-INR
INR: 1.1 (ref 0.8–1.2)
Prothrombin Time: 14.1 seconds (ref 11.4–15.2)

## 2019-11-08 LAB — HEPATIC FUNCTION PANEL
ALT: 33 U/L (ref 0–44)
AST: 20 U/L (ref 15–41)
Albumin: 2.1 g/dL — ABNORMAL LOW (ref 3.5–5.0)
Alkaline Phosphatase: 171 U/L — ABNORMAL HIGH (ref 38–126)
Bilirubin, Direct: 0.2 mg/dL (ref 0.0–0.2)
Indirect Bilirubin: 0.4 mg/dL (ref 0.3–0.9)
Total Bilirubin: 0.6 mg/dL (ref 0.3–1.2)
Total Protein: 4.5 g/dL — ABNORMAL LOW (ref 6.5–8.1)

## 2019-11-08 LAB — APTT: aPTT: 25 seconds (ref 24–36)

## 2019-11-08 LAB — LACTATE DEHYDROGENASE: LDH: 349 U/L — ABNORMAL HIGH (ref 98–192)

## 2019-11-08 MED ORDER — LEVALBUTEROL HCL 1.25 MG/0.5ML IN NEBU
1.2500 mg | INHALATION_SOLUTION | Freq: Three times a day (TID) | RESPIRATORY_TRACT | Status: DC
  Administered 2019-11-09 – 2019-11-12 (×10): 1.25 mg via RESPIRATORY_TRACT
  Filled 2019-11-08 (×11): qty 0.5

## 2019-11-08 MED ORDER — FAMOTIDINE 20 MG PO TABS
20.0000 mg | ORAL_TABLET | Freq: Two times a day (BID) | ORAL | Status: DC
  Administered 2019-11-08 – 2019-11-12 (×8): 20 mg via ORAL
  Filled 2019-11-08 (×8): qty 1

## 2019-11-08 MED ORDER — SPIRONOLACTONE 25 MG PO TABS
50.0000 mg | ORAL_TABLET | Freq: Two times a day (BID) | ORAL | Status: DC
  Administered 2019-11-08 – 2019-11-12 (×9): 50 mg via ORAL
  Filled 2019-11-08 (×9): qty 2

## 2019-11-08 MED ORDER — HEPARIN (PORCINE) 25000 UT/250ML-% IV SOLN
1250.0000 [IU]/h | INTRAVENOUS | Status: DC
  Administered 2019-11-08: 1100 [IU]/h via INTRAVENOUS
  Filled 2019-11-08: qty 250

## 2019-11-08 MED ORDER — HEPARIN BOLUS VIA INFUSION
4000.0000 [IU] | Freq: Once | INTRAVENOUS | Status: AC
  Administered 2019-11-08: 4000 [IU] via INTRAVENOUS
  Filled 2019-11-08: qty 4000

## 2019-11-08 MED ORDER — ALUM & MAG HYDROXIDE-SIMETH 200-200-20 MG/5ML PO SUSP
30.0000 mL | ORAL | Status: DC | PRN
  Administered 2019-11-08 – 2019-11-10 (×2): 30 mL via ORAL
  Filled 2019-11-08 (×2): qty 30

## 2019-11-08 MED ORDER — IPRATROPIUM BROMIDE 0.02 % IN SOLN
0.5000 mg | Freq: Three times a day (TID) | RESPIRATORY_TRACT | Status: DC
  Administered 2019-11-09 – 2019-11-12 (×10): 0.5 mg via RESPIRATORY_TRACT
  Filled 2019-11-08 (×11): qty 2.5

## 2019-11-08 NOTE — Progress Notes (Signed)
PCCM Progress note  Notified that lower extremity doppler was positive for acute DVT in left lower extremity. Patient is being considered for possible bilateral Pleurex catheter placement. Will clarify procedure plan prior to initiation of anticoagulation. Given history of Lymphoma will likely utilize low molecular weight heparin.   Johnsie Cancel, NP-C [facility] Contact / Pager information can be found on Amion  11/08/2019, 6:42 PM

## 2019-11-08 NOTE — Progress Notes (Signed)
HEMATOLOGY-ONCOLOGY PROGRESS NOTE  SUBJECTIVE: Sitting up in bed.  No longer on oxygen.  States that he is feeling better overall.  He has less shortness of breath.  Had a right sided thoracentesis performed over the weekend with 1.4 L removed.  Cytology pending.  He is not having any nausea, vomiting, diarrhea.  Bowels move twice yesterday.  He notices ongoing lymphadenopathy in his left chest/axilla.  Abdomen still feels tight.  Still having lower extremity edema.  Oncology History  Non-Hodgkin lymphoma ([place])  08/25/2019 Initial Diagnosis   Non-Hodgkin lymphoma ([place])   08/25/2019 - 08/31/2019 Chemotherapy   The patient had riTUXimab (RITUXAN) 700 mg in sodium chloride 0.9 % 250 mL (2.1875 mg/mL) infusion, 375 mg/m2 = 700 mg (100 % of original dose 375 mg/m2), Intravenous,  Once, 1 of 1 cycle Dose modification: 375 mg/m2 (original dose 375 mg/m2, Cycle 1) Administration: 700 mg (08/25/2019) riTUXimab-pvvr (RUXIENCE) 700 mg in sodium chloride 0.9 % 250 mL (2.1875 mg/mL) infusion, 375 mg/m2 = 700 mg (100 % of original dose 375 mg/m2), Intravenous,  Once, 0 of 3 cycles Dose modification: 375 mg/m2 (original dose 375 mg/m2, Cycle 2), 375 mg/m2 (original dose 375 mg/m2, Cycle 3)  for chemotherapy treatment.    09/01/2019 - 10/12/2019 Chemotherapy   The patient had DOXOrubicin (ADRIAMYCIN) chemo injection 68 mg, 37.5 mg/m2 = 68 mg (75 % of original dose 50 mg/m2), Intravenous,  Once, 2 of 6 cycles Dose modification: 37.5 mg/m2 (75 % of original dose 50 mg/m2, Cycle 1, Reason: Provider Judgment) Administration: 68 mg (09/01/2019), 68 mg (09/22/2019) palonosetron (ALOXI) injection 0.25 mg, 0.25 mg, Intravenous,  Once, 2 of 6 cycles Administration: 0.25 mg (09/01/2019), 0.25 mg (09/22/2019) pegfilgrastim-cbqv (UDENYCA) injection 6 mg, 6 mg, Subcutaneous, Once, 1 of 5 cycles Administration: 6 mg (09/23/2019) vinCRIStine (ONCOVIN) 1.5 mg in sodium chloride 0.9 % 50 mL chemo infusion, 1.5 mg (75 % of  original dose 2 mg), Intravenous,  Once, 2 of 6 cycles Dose modification: 1.5 mg (75 % of original dose 2 mg, Cycle 1, Reason: Provider Judgment) Administration: 1.5 mg (09/01/2019), 1.5 mg (09/22/2019) riTUXimab (RITUXAN) 700 mg in sodium chloride 0.9 % 250 mL (2.1875 mg/mL) infusion, 375 mg/m2 = 700 mg (100 % of original dose 375 mg/m2), Intravenous,  Once, 1 of 1 cycle Dose modification: 375 mg/m2 (original dose 375 mg/m2, Cycle 1) Administration: 700 mg (09/01/2019) cyclophosphamide (CYTOXAN) 1,020 mg in sodium chloride 0.9 % 250 mL chemo infusion, 562.5 mg/m2 = 1,020 mg (75 % of original dose 750 mg/m2), Intravenous,  Once, 2 of 6 cycles Dose modification: 562.5 mg/m2 (75 % of original dose 750 mg/m2, Cycle 1, Reason: Provider Judgment) Administration: 1,020 mg (09/01/2019), 1,020 mg (09/22/2019) fosaprepitant (EMEND) 150 mg, dexamethasone (DECADRON) 12 mg in sodium chloride 0.9 % 145 mL IVPB, , Intravenous,  Once, 2 of 6 cycles Administration:  (09/01/2019),  (09/22/2019) riTUXimab-pvvr (RUXIENCE) 700 mg in sodium chloride 0.9 % 250 mL (2.1875 mg/mL) infusion, 375 mg/m2 = 700 mg, Intravenous,  Once, 1 of 1 cycle  for chemotherapy treatment.    10/22/2019 - 10/29/2019 Chemotherapy   The patient had obinutuzumab (GAZYVA) 100 mg in sodium chloride 0.9 % 100 mL (0.9615 mg/mL) chemo infusion, 100 mg, Intravenous, Once, 1 of 6 cycles Administration: 100 mg (10/22/2019), 1,000 mg (10/29/2019)  for chemotherapy treatment.       REVIEW OF SYSTEMS:   Noncontributory except as noted in the HPI.  PHYSICAL EXAMINATION: ECOG PERFORMANCE STATUS: 1 - Symptomatic but completely ambulatory  Vitals:  11/08/19 0800 11/08/19 0807  BP: (!) 114/55   Pulse: (!) 104   Resp: 16   Temp: 98.2 F (36.8 C)   SpO2: 94% 90%   Filed Weights   11/05/19 0500 11/06/19 0500 11/08/19 0400  Weight: 149 lb 4 oz (67.7 kg) 149 lb 7.6 oz (67.8 kg) 147 lb 14.9 oz (67.1 kg)    Intake/Output from previous day: 02/07  0701 - 02/08 0700 In: -  Out: 2425 [Urine:2425]  GENERAL:alert, no distress LYMPH: Persistent left axillary lymphadenopathy HEART: Tachycardic on monitor, 2+ bilateral lower extremity edema ABDOMEN: Positive bowel sounds, mildly distended, nontender, hepatosplenomegaly noted Musculoskeletal:no cyanosis of digits and no clubbing  NEURO: alert & oriented x 3 with fluent speech, no focal motor/sensory deficits  LABORATORY DATA:  I have reviewed the data as listed CMP Latest Ref Rng & Units 11/04/2019 11/03/2019 11/01/2019  Glucose 70 - 99 mg/dL 126(H) 121(H) 124(H)  BUN 6 - 20 mg/dL 25(H) 23(H) 24(H)  Creatinine 0.61 - 1.24 mg/dL 0.67 0.60(L) 0.46(L)  Sodium 135 - 145 mmol/L 134(L) 135 139  Potassium 3.5 - 5.1 mmol/L 4.3 4.2 3.9  Chloride 98 - 111 mmol/L 96(L) 97(L) 103  CO2 22 - 32 mmol/L 25 26 25   Calcium 8.9 - 10.3 mg/dL 9.2 9.1 9.1  Total Protein 6.5 - 8.1 g/dL - - -  Total Bilirubin 0.3 - 1.2 mg/dL - - -  Alkaline Phos 38 - 126 U/L - - -  AST 15 - 41 U/L - - -  ALT 0 - 44 U/L - - -    Lab Results  Component Value Date   WBC 4.0 11/04/2019   HGB 10.4 (L) 11/04/2019   HCT 33.1 (L) 11/04/2019   MCV 92.5 11/04/2019   PLT 115 (L) 11/04/2019   NEUTROABS 3.5 11/04/2019    CT CHEST WO CONTRAST  Result Date: 10/29/2019 CLINICAL DATA:  With lymphoma, abnormal radiograph EXAM: CT CHEST WITHOUT CONTRAST TECHNIQUE: Multidetector CT imaging of the chest was performed following the standard protocol without IV contrast. COMPARISON:  Radiograph 10/28/2018, CT 08/25/2019 FINDINGS: Cardiovascular: Left IJ approach Port-A-Cath is accessed with the tip positioned at the right atrium. Normal cardiac size. Redemonstration of the extensive pericardial soft tissue attenuation lesions these appear to extend more superiorly towards the cardiac base than on comparison study including a larger crescentic amount of soft tissue measuring approximately 6.9 cm anteroposterior and 1.7 cm in thickness near the  level of the pulmonary trunk (2/79). Central pulmonary arteries are normal caliber. Atherosclerotic plaque within the normal caliber aorta. Normal 3 vessel branching of the aortic arch with minimal plaque in the great vessels. Mediastinum/Nodes: Decrease in the size of bulky thoracic lymphadenopathy compatible with patient's known lymphoproliferative disorder. Index lymph nodes include: *A high right paratracheal node measuring 12 mm short axis (2/45), previously 23 mm *AP window lymph node measuring 24 mm (4/35), previously 29 mm *Right hilar node measuring 15 mm (2/85), previously 21 mm *Subcarinal node measuring 18 mm (2/69), previously 26 mm *Right inter lobar lymph node on comparison is difficult to assess given absence of contrast media. Thyroid gland is unremarkable. No acute abnormality of the trachea or esophagus. Lungs/Pleura: Large bilateral pleural effusions are noted. Dense bandlike areas of atelectasis are noted in both lungs. Multiple nodules are seen in the right middle and lower lobe examples include a 8 mm nodule laterally (4/102) in lower lobe and a subpleural 6 mm nodule anteriorly in the right middle lobe (4/98). No visible nodules seen in  the left lung though underlying atelectasis could obscure the presence of nodules or masses. Upper Abdomen: Marked splenomegaly. Some wedge-shaped hypoattenuation in the periphery of the spleen measuring 1.6 x 3.2 cm, compatible with sequela of remote splenic infarct. Indeterminate exophytic 12 mm lesion arising from the upper pole right kidney. Musculoskeletal: Multilevel degenerative changes are present in the imaged portions of the spine. Evaluation of the chest wall limited given extensive motion artifact which may limit detection of sternal and rib fractures. No gross acute osseous abnormality or suspicious osseous lesions. IMPRESSION: 1. Mixed response of patient's lymphoproliferative disease with overall decrease in the size of bulky thoracic  lymphadenopathy but with increasing pericardial soft tissue masses and multiple new lung nodules. 2. Large bilateral pleural effusions with associated compressive atelectasis. 3. 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 4. Indeterminate 12 mm lesion arising from the upper pole of the right kidney. Indeterminate on noncontrast CT. Could consider follow-up with nonemergent outpatient MR or CT. 5. Evaluation of the chest wall limited given extensive motion artifact which may limit detection of sternal and rib fractures. No gross acute osseous abnormality. 6.  Aortic Atherosclerosis (ICD10-I70.0). Electronically Signed   By: Lovena Le M.D.   On: 10/29/2019 20:36   DG Chest Port 1 View  Result Date: 11/06/2019 CLINICAL DATA:  Status post right thoracentesis. EXAM: PORTABLE CHEST 1 VIEW COMPARISON:  November 05, 2019. FINDINGS: Stable cardiomediastinal silhouette. Right internal jugular Port-A-Cath is unchanged. No pneumothorax is noted. Right pleural effusion is smaller status post thoracentesis. Stable small left pleural effusion with associated atelectasis. Bony thorax is unremarkable. IMPRESSION: No pneumothorax status post right thoracentesis. Right pleural effusion is smaller. Stable small left pleural effusion with associated atelectasis. Electronically Signed   By: Marijo Conception M.D.   On: 11/06/2019 12:17   DG Chest Port 1 View  Result Date: 11/05/2019 CLINICAL DATA:  Status post left thoracentesis EXAM: PORTABLE CHEST 1 VIEW COMPARISON:  11/04/2019 FINDINGS: Slight interval reduction in volume of a left pleural effusion, now small, with associated atelectasis or consolidation. No pneumothorax. Unchanged moderate, layering right pleural effusion and associated atelectasis or consolidation. Right chest port catheter. IMPRESSION: Slight interval reduction in volume of a left pleural effusion, now small, with associated atelectasis or consolidation. No pneumothorax. Electronically Signed    By: Eddie Candle M.D.   On: 11/05/2019 19:06   DG CHEST PORT 1 VIEW  Result Date: 11/04/2019 CLINICAL DATA:  Shortness of breath EXAM: PORTABLE CHEST 1 VIEW COMPARISON:  11/04/2019 FINDINGS: Bilateral pleural effusions are again seen. Vascular congestion is noted. Right chest wall port is again noted. The overall appearance is similar to that seen on the prior exam. IMPRESSION: Stable bilateral pleural effusions and vascular congestion similar to that seen on the prior exam. Electronically Signed   By: Inez Catalina M.D.   On: 11/04/2019 23:52   DG Chest Port 1 View  Result Date: 11/04/2019 CLINICAL DATA:  Follow-up pleural effusions. EXAM: PORTABLE CHEST 1 VIEW COMPARISON:  Multiple previous chest films. The most recent is 11/01/2019 FINDINGS: The right IJ power port is stable. Persistent moderate-sized bilateral pleural effusions with significant overlying atelectasis. Central vascular congestion and possible mild perihilar interstitial edema. IMPRESSION: Persistent moderate-sized bilateral pleural effusions with significant overlying atelectasis. Electronically Signed   By: Marijo Sanes M.D.   On: 11/04/2019 06:14   DG CHEST PORT 1 VIEW  Result Date: 11/01/2019 CLINICAL DATA:  Status post thoracentesis EXAM: PORTABLE CHEST 1 VIEW COMPARISON:  10/31/19 FINDINGS: Cardiac  shadow is stable. Right chest wall port is noted. Small pleural effusions are noted bilaterally with basilar atelectasis. No pneumothorax is seen. IMPRESSION: Small pleural effusions bilaterally with basilar atelectasis. No pneumothorax is identified. Electronically Signed   By: Inez Catalina M.D.   On: 11/01/2019 01:48   DG Chest Port 1 View  Result Date: 10/31/2019 CLINICAL DATA:  Status post left thoracentesis. EXAM: PORTABLE CHEST 1 VIEW COMPARISON:  October 31, 2019 FINDINGS: The left-sided pleural effusion is smaller after thoracentesis. No pneumothorax. Bilateral pleural effusions remain. The right Port-A-Cath is stable. No  other interval changes. IMPRESSION: The left-sided pleural effusion is smaller after thoracentesis. No pneumothorax. No other changes. Electronically Signed   By: Dorise Bullion III M.D   On: 10/31/2019 10:29   DG Chest Port 1 View  Result Date: 10/31/2019 CLINICAL DATA:  Follow-up pleural effusion EXAM: PORTABLE CHEST 1 VIEW COMPARISON:  10/30/2019 FINDINGS: Power port unchanged. Bilateral effusions, larger on the left than the right. Volume loss in the lower lungs, worse on the left than the right. No new radiographic finding. IMPRESSION: No change. Large bilateral effusions left more than right with dependent volume loss left more than right. Electronically Signed   By: Nelson Chimes M.D.   On: 10/31/2019 06:00   DG Chest Port 1 View  Result Date: 10/30/2019 CLINICAL DATA:  Respiratory failure. EXAM: PORTABLE CHEST 1 VIEW COMPARISON:  Chest radiograph 10/29/2019 FINDINGS: Right anterior chest wall Port-A-Cath is present with tip projecting over the superior vena cava. Monitoring leads overlie the patient. Stable enlarged cardiac and mediastinal contours. Persistent moderate to large layering bilateral pleural effusions with underlying pulmonary consolidation. No pneumothorax. IMPRESSION: Persistent moderate to large layering bilateral effusions with underlying consolidation. Electronically Signed   By: Lovey Newcomer M.D.   On: 10/30/2019 04:58   DG Chest Port 1 View  Result Date: 10/29/2019 CLINICAL DATA:  Shortness of breath EXAM: PORTABLE CHEST 1 VIEW COMPARISON:  09/01/2019, CT 08/25/2019 FINDINGS: Right-sided central venous port tip over the cavoatrial region allowing for rotated patient. Suspected moderate layering right pleural effusion, probably increased from prior radiograph. Moderate left pleural effusion, also suspect increase in size. Enlarged cardiomediastinal silhouette with vascular congestion. Consolidation at the left lung base. Fluid in the right fissure. Hazy appearance of both  thoraces likely due to layering fluid although underlying pulmonary edema could be contributing. No pneumothorax. IMPRESSION: Hazy appearance of the bilateral lungs since 09/01/2019, suspected to be secondary to moderate layering pleural effusions, probably increased in the interim. Cardiomegaly with vascular congestion; hazy lung appearance could also be due to associated edema. Atelectasis or pneumonia at the left base. Electronically Signed   By: Donavan Foil M.D.   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  He had bulky lymphadenopathy in the chest and abdomen as well as hepatosplenomegaly and involvement of the bone marrow.  He also had significant pancytopenia on presentation.  He was initially tried on single agent Rituxan but this was discontinued secondary to hypersensitivity reaction.  He received 2 cycles of CHOP, but did not respond well.  His chemotherapy was switched to Obinutuzumab and acalabrutinib.  He had an infusion reaction to obinutuzumab and is now hospitalized.    Status post thoracentesis x2.  Cytology from left thoracentesis on 10/31/2019 showed findings consistent with lymphoma.  Cytology from right-sided thoracentesis on 11/06/2019 is pending.  Breathing appears improved.   Patient  started on Calquence 100 mg twice a day on 11/04/2019.  He is tolerating this well overall.  He has not had any lab work performed since 11/04/2019 and I have ordered a CBC with differential, hepatic function panel, LDH, and uric acid.  We will follow-up on these results once they become available.   LOS: 10 days   Mikey Bussing, DNP, AGPCNP-BC, AOCNP 11/08/19

## 2019-11-08 NOTE — Progress Notes (Signed)
Bilateral lower extremity venous duplex has been completed. Preliminary results can be found in CV Proc through chart review.  Results were given to the patient's nurse, Wells Guiles.  11/08/19 5:01 PM Isaac Pierce RVT

## 2019-11-08 NOTE — Progress Notes (Signed)
ANTICOAGULATION CONSULT NOTE - Initial Consult  Pharmacy Consult for Heparin Indication: DVT  Allergies  Allergen Reactions  . Obinutuzumab Shortness Of Breath    Reaction on 08/28/2020  . Rituximab Rash    Had rash, anxiety, HTN & tachycardia ~19min into 1st infusion 08/27/19    Patient Measurements: Height: 5\' 11"  (180.3 cm) Weight: 147 lb 14.9 oz (67.1 kg) IBW/kg (Calculated) : 75.3 Heparin Dosing Weight: actual body weight  Vital Signs: Temp: 98.1 F (36.7 C) (02/08 1645) Temp Source: Oral (02/08 1645) BP: 122/65 (02/08 1800) Pulse Rate: 115 (02/08 1800)  Labs: Recent Labs    11/08/19 1200  HGB 9.7*  HCT 30.3*  PLT 80*  CREATININE 0.58*    Estimated Creatinine Clearance: 97.9 mL/min (A) (by C-G formula based on SCr of 0.58 mg/dL (L)).   Medical History: Past Medical History:  Diagnosis Date  . Anxiety   . Back pain   . GERD (gastroesophageal reflux disease)     Medications:  No oral anticoagulation PTA  Assessment:  59 yr male admitted on 10/29/19 with acute respiratory failure following Mab Gazyva infusion  + LLE DVT found today  Pharmacy consulted to dose IV heparin  Plan for Pleurx catheter placement on 2/9  Goal of Therapy:  Heparin level 0.3-0.7 units/ml Monitor platelets by anticoagulation protocol: Yes   Plan:   Obtain baseline aPTT and PT/INR  Heparin 4000 unit IV bolus x 1 followed by heparin gtt @ 1100 units/hr   Check heparin level 6 hr after heparin started  Plan to stop heparin 3 hours before procedure on 2/9 (will f/u with timing of procedure 2/9 AM)  Plan to transition to [place], Toribio [place]bour 11/08/2019,7:30 PM

## 2019-11-08 NOTE — Plan of Care (Signed)
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  Found to have LLE DVT today.  Discussed risks, benefits, alternatives of Pleurx catheters.  He is interested in having these done so that he can more easily get to his appointments and go about his daily life without shortness of breath and the fear of having severe dyspnea when he is alone or unable to get to the hospital quickly.  He would like to proceed.  Ultrasound demonstrates L>R dependent pleural effusions.  Small septation posteriorly on the right.  Both look amenable to Pleurx catheter placement.  Planning for bilateral Pleurx catheter placement tomorrow afternoon.  He does not need to be NPO.  Using heparin infusion for DVT treatment so it can be stopped 3 hours prior to planned procedure time tomorrow.  Subsequently he likely could start a DOAC to go home on.  He will need home health and bottles after discharge.  Isaac Hy, DO 11/08/19 7:04 PM [facility]

## 2019-11-08 NOTE — Progress Notes (Signed)
NAME:  Isaac Pierce, MRN:  [id number], DOB:  Nov 15, 1960, LOS: 68 ADMISSION DATE:  10/29/2019, CONSULTATION DATE:  10/29/2019  REFERRING MD:  Dr Francia Greaves of [facility], CHIEF COMPLAINT:  Acute resp failure following Mab infussion   Brief History   59 yo male with hx of CLL/small B cell lymphoma failed CHOP, and started obinutuzumab with cycle 1 on January 22.  Received PRBC transfusion January 28.  On January 29 was started on cycle 2 of CD20 antibody, and then developed stridor with diaphoresis and respiratory distress.  Treated with steroids and epinephrine in ER, and required Bipap.  Had thoracentesis with 2 L on 10/31/19 Repeat right-sided thoracentesis 11/05/2019  Past Medical History  GERD, Anxiety, Back pain   Significant Hospital Events   1/29 admit 1/30 transfuse 2 units PRBC 1/31 left thoracentesis 2/5 left thoracentesis  2/6 left thoracentesis   Consults:  Oncology  Procedures:  Thoracentesis 1/31 2 L of serous fluid Thoracentesis 2/5 1.3 L of clear serous fluid Thoracentesis 2/6 1.4 L of cloudy amber-colored fluid  Significant Diagnostic Tests:  CT chest 1/29 >> decreased size of thoracic LN, increased size of pericardial soft tissue masses and multiple new lung nodules, large b/l effusions, splenomegaly  Chest x-ray 2/4 shows bilateral pleural effusion   Micro Data:  SARS CoV2 PCR 1/29 >> negative Influenza PCR 1/29 >> A and B negative Blood 1/29 >> Negative  RVP 1/29 >> negative Urine 1/29 >> negative Pneumococcal Ag 1/29 >> negative Legionella Ag 1/29 >> negative Lt pleural fluid 1/31>>no organism Lt pleural fluid 2/6 >>   Antimicrobials:  Vancomycin 1/29 x1 Cefepime 1/29>>2/2  Interim history/subjective:  No acute events overnight, patient states he feels well today and is near his baseline.   Objective   Blood pressure (!) 114/55, pulse (!) 104, temperature 98.2 F (36.8 C), temperature source Axillary, resp. rate 16, height 5\' 11"  (1.803 m), weight 67.1  kg, SpO2 90 %.        Intake/Output Summary (Last 24 hours) at 11/08/2019 [id number] Last data filed at 11/08/2019 0809 Gross per 24 hour  Intake --  Output 2525 ml  Net -2525 ml   Filed Weights   11/05/19 0500 11/06/19 0500 11/08/19 0400  Weight: 67.7 kg 67.8 kg 67.1 kg    Examination: General: Chronically ill appearing thin adult male lying in bed, in NAD HEENT:  [place]/AT, MM pink/moist, PERRL, poor dentition  Neuro: Alert and oriented x3, non-focal  CV: Tachycardia s1s2 regular rate and rhythm, no murmur, rubs, or gallops,  PULM:  Diminished breath sounds bilaterally, slight increased work of breathing with oxygen saturations 90-97% on room air currently  GI: soft, bowel sounds active in all 4 quadrants, non-tender, non-distended, Extremities: warm/dry, 2+ bilateral LE edema  Skin: no rashes or lesions  Resolved Hospital Problem list     Assessment & Plan:  Acute hypoxic respiratory failure from allergic reaction to obinutuzumab  Bilateral pleural effusion s/p bilateral thoracentesis x3 -During admission thus far patient has required 3 thoracentesis on the left and 1 on the right with large volume removal. Pathology review of pleural fluid from 1/31 consistent with lymphoma  P: Continue diuresing, -13L thus far  May be a candidate for Pleurex cath  Supplemental oxygen as needed to maintain sats greater than 90% Pulmonary hygiene  Including flutter valve  Follow CXR as needed Continue bronchodilators  Prednisone decreased to 20mg  on 2/7   CLL/B-cell lymphoma Anemia  -Dermatology and  oncology following P: Hemoglobin stable  Transfuse per  protocol   Sinus tachycardia P: Continue Cardizem Continuous telemetry   Anxiety P: Continue Xanax   Best practice:  Diet: regular diet DVT prophylaxis: scd GI prophylaxis: ppi Mobility: bed rest Code status: full code Disposition: step down unit  Labs:   CMP Latest Ref Rng & Units 11/04/2019 11/03/2019 11/01/2019  Glucose 70 - 99  mg/dL 126(H) 121(H) 124(H)  BUN 6 - 20 mg/dL 25(H) 23(H) 24(H)  Creatinine 0.61 - 1.24 mg/dL 0.67 0.60(L) 0.46(L)  Sodium 135 - 145 mmol/L 134(L) 135 139  Potassium 3.5 - 5.1 mmol/L 4.3 4.2 3.9  Chloride 98 - 111 mmol/L 96(L) 97(L) 103  CO2 22 - 32 mmol/L 25 26 25   Calcium 8.9 - 10.3 mg/dL 9.2 9.1 9.1  Total Protein 6.5 - 8.1 g/dL - - -  Total Bilirubin 0.3 - 1.2 mg/dL - - -  Alkaline Phos 38 - 126 U/L - - -  AST 15 - 41 U/L - - -  ALT 0 - 44 U/L - - -    CBC Latest Ref Rng & Units 11/04/2019 11/03/2019 11/01/2019  WBC 4.0 - 10.5 K/uL 4.0 6.1 4.0  Hemoglobin 13.0 - 17.0 g/dL 10.4(L) 10.5(L) 9.6(L)  Hematocrit 39.0 - 52.0 % 33.1(L) 33.2(L) 29.9(L)  Platelets 150 - 400 K/uL 115(L) 113(L) 93(L)   Scheduled Meds: . acalabrutinib  100 mg Oral BID  . allopurinol  300 mg Oral Daily  . chlorhexidine  15 mL Mouth Rinse BID  . Chlorhexidine Gluconate Cloth  6 each Topical Daily  . diltiazem  60 mg Oral [id number]  . folic acid  1 mg Oral Daily  . furosemide  40 mg Intravenous Q12H  . guaiFENesin  600 mg Oral BID  . insulin aspart  0-15 Units Subcutaneous TID WC  . insulin aspart  0-5 Units Subcutaneous QHS  . ipratropium  0.5 mg Nebulization Q4H  . levalbuterol  1.25 mg Nebulization Q4H  . mouth rinse  15 mL Mouth Rinse q12n4p  . nicotine  14 mg Transdermal Daily  . predniSONE  20 mg Oral BID WC  . sodium chloride flush  3 mL Intravenous Q12H   Continuous Infusions: . sodium chloride Stopped (10/31/19 0746)   PRN Meds:.sodium chloride, acetaminophen, HYDROcodone-acetaminophen, hydrOXYzine, ipratropium-albuterol, menthol-cetylpyridinium, phenol, phenylephrine-shark liver oil-mineral oil-petrolatum, senna, sodium chloride flush, traMADol   ABG    Component Value Date/Time   PHART 7.404 10/29/2019 2140   PCO2ART 42.5 10/29/2019 2140   PO2ART 59.5 (L) 10/29/2019 2140   HCO3 26.0 10/29/2019 2140   ACIDBASEDEF 1.4 10/29/2019 1404   O2SAT 87.4 10/29/2019 2140    CBG (last 3)  Recent Labs      11/07/19 1812 11/08/19 0003 11/08/19 0745  GLUCAP 137* 142* 99   Signature  Johnsie Cancel, NP-C [facility] Contact / Pager information can be found on Amion  11/08/2019, 9:25 AM

## 2019-11-08 NOTE — Progress Notes (Signed)
[facility] Progress Note Patient Name: Isaac Pierce DOB: 09-30-1961 MRN: [id number]   Date of Service  11/08/2019  HPI/Events of Note  Patient is requesting something for heartburn/indigestion. He states that he takes prilosec every day at home. He took maalox @ 1835 and did not get relief.    eICU Interventions  PPI is not recommended with [place]. Can try Pepcid 20mg  Twice daily . If not improving will need further eval for other etiology .      Intervention Category Minor Interventions: Other:  Dajha Urquilla 11/08/2019, 9:22 PM

## 2019-11-09 ENCOUNTER — Inpatient Hospital Stay (HOSPITAL_COMMUNITY): Payer: Medicaid Other

## 2019-11-09 DIAGNOSIS — I824Y2 Acute embolism and thrombosis of unspecified deep veins of left proximal lower extremity: Secondary | ICD-10-CM

## 2019-11-09 DIAGNOSIS — J910 Malignant pleural effusion: Secondary | ICD-10-CM

## 2019-11-09 LAB — GLUCOSE, CAPILLARY
Glucose-Capillary: 117 mg/dL — ABNORMAL HIGH (ref 70–99)
Glucose-Capillary: 142 mg/dL — ABNORMAL HIGH (ref 70–99)
Glucose-Capillary: 92 mg/dL (ref 70–99)
Glucose-Capillary: 199 mg/dL — ABNORMAL HIGH (ref 70–99)

## 2019-11-09 LAB — HEPARIN LEVEL (UNFRACTIONATED)
Heparin Unfractionated: <0.1 IU/mL — ABNORMAL LOW (ref 0.30–0.70)
Heparin Unfractionated: <0.1 IU/mL — ABNORMAL LOW (ref 0.30–0.70)

## 2019-11-09 LAB — CBC
HCT: 27.8 % — ABNORMAL LOW (ref 39.0–52.0)
Hemoglobin: 9 g/dL — ABNORMAL LOW (ref 13.0–17.0)
MCH: 29 pg (ref 26.0–34.0)
MCHC: 32.4 g/dL (ref 30.0–36.0)
MCV: 89.7 fL (ref 80.0–100.0)
Platelets: 77 10*3/uL — ABNORMAL LOW (ref 150–400)
RBC: 3.1 MIL/uL — ABNORMAL LOW (ref 4.22–5.81)
RDW: 16.8 % — ABNORMAL HIGH (ref 11.5–15.5)
WBC: 3 10*3/uL — ABNORMAL LOW (ref 4.0–10.5)
nRBC: 0 % (ref 0.0–0.2)

## 2019-11-09 LAB — CYTOLOGY - NON PAP

## 2019-11-09 MED ORDER — FENTANYL CITRATE (PF) 100 MCG/2ML IJ SOLN
50.0000 ug | Freq: Once | INTRAMUSCULAR | Status: AC
  Administered 2019-11-09: 50 ug via INTRAVENOUS

## 2019-11-09 MED ORDER — FENTANYL CITRATE (PF) 100 MCG/2ML IJ SOLN
INTRAMUSCULAR | Status: AC
  Filled 2019-11-09: qty 2

## 2019-11-09 MED ORDER — APIXABAN 5 MG PO TABS: 5.0000 mg | ORAL_TABLET | Freq: Two times a day (BID) | ORAL | Status: DC

## 2019-11-09 MED ORDER — APIXABAN 5 MG PO TABS
10.0000 mg | ORAL_TABLET | Freq: Two times a day (BID) | ORAL | Status: DC
  Administered 2019-11-09 – 2019-11-12 (×6): 10 mg via ORAL
  Filled 2019-11-09 (×2): qty 2
  Filled 2019-11-09: qty 4
  Filled 2019-11-09 (×4): qty 2

## 2019-11-09 MED ORDER — LIDOCAINE HCL 1 % IJ SOLN
INTRAMUSCULAR | Status: AC
  Filled 2019-11-09: qty 20

## 2019-11-09 MED ORDER — LIDOCAINE HCL 2 % IJ SOLN
INTRAMUSCULAR | Status: AC
  Filled 2019-11-09: qty 20

## 2019-11-09 NOTE — Telephone Encounter (Signed)
I have called the [employer] rep [phone]) was told the person who I needed to speak to was on the phone and would call me back. Left desk number with [employer], will await phone call.  Checking to see if insurance is covered for supplies, and if not make sure patients gets supplies, also patient needs follow up in office in 3-5 days post pleurX cath placement (11/09/19)

## 2019-11-09 NOTE — Procedures (Addendum)
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 Alva  Assistants: none  Anesthesia: conscious sedation`  Meds Given: fentanyl 50 mcg,  1% lidocaine 1 5 cc total  Operation: PleurX catheter insertion on the RT  Estimated Blood Loss: Minimal   Complications: none noted  Indications and History: Isaac Pierce is 59 y.o. with a history of malignant pleural effusion.  Recommendation was to perform Pleurx catheter insertion. The risks, benefits, potential complications, treatment options, and expected outcomes were discussed with the patient.  The possibilities of pneumothorax, infection, or damage to surrounding structures were discussed with the patient who freely signed the consent.    Description of Procedure: The patient was seen in the preoperative area, was examined and was deemed appropriate to proceed.  The patient was in the ICU, identified as Isaac Pierce with date of birth Nov 18, 1960  and MRN [id number], and the procedure verified as Pleurx catheter placement.  A Time Out was held and the above information confirmed.   Conscious sedation was initiated as indicated above.  With the patient in the semirecumbent position, ultrasound was used to locate fluid in the pleural space.  The site was marked and the area thoroughly cleaned with chlorhexidine.  The area was anesthetized with 1% lidocaine, including through subcutaneous tissues to the pleura.  Pleural fluid was withdrawn with the finder needle.  The procedure needle was advanced through the same tract into the pleural fluid, where fluid was again withdrawn.  The guidewire was advanced into the pleural space with subsequent removal of the needle.  A few centimeters anteriorly a skin incision was made, followed by a skin incision by the guidewire.  A Pleurx catheter attached to a soft tissue dissector was advanced  through the anterior incision with tunneling through the subcutaneous tissues to the posterior incision.  The Pleurx catheter was placed with the cuff appropriately about 1 cm from the anterior incision.  The soft tissue dissector was separated from a the Pleurx catheter.  Dilator was advanced over the guidewire into the pleural space and withdrawn.  The guidewire and peel-away catheter were advanced over the guidewire into the pleural space, followed by removal of the dilator and guidewire while leaving the peel-away catheter in place.  The Pleurx catheter was then advanced through the peel-away catheter with the catheter removed in its entirety. The pleurX catheter was connected to a sterile pleurX bottle with successful drainage of pleural fluid. The catheter was secured in place with sutures. The posterior incision was closed with 1 suture. Following successful drainage of 1300 cc of pleural fluid, the bottle was removed from the catheter. A sterile cap was placed over the catheter opening and a sterile dressing was applied.    Plans:  Home health has been ordered, as well as bottles for home drainage. Follow up in Pulmonary clinic has been arranged .  Procedure was supervised by Dr. Mikeal Hawthorne V. Alva 11/09/19 4:32 PM [facility]   I directly supervised placement. No complications. Follow up CXR with appropriate position of catheters.  Julian Hy, DO 11/09/19 5:55 PM [facility]

## 2019-11-09 NOTE — Progress Notes (Signed)
NAME:  Isaac Pierce, MRN:  [id number], DOB:  July 17, 1961, LOS: 40 ADMISSION DATE:  10/29/2019, CONSULTATION DATE:  10/29/2019  REFERRING MD:  Dr Francia Greaves of [facility], CHIEF COMPLAINT:  Acute resp failure following Mab infussion   Brief History   59 yo male with hx of CLL/small B cell lymphoma failed CHOP, and started obinutuzumab with cycle 1 on January 22.  Received PRBC transfusion January 28.  On January 29 was started on cycle 2 of CD20 antibody, and then developed stridor with diaphoresis and respiratory distress.  Treated with steroids and epinephrine in ER, and required Bipap.  Had thoracentesis with 2 L on 10/31/19 Repeat right-sided thoracentesis 11/05/2019  Past Medical History  GERD, Anxiety, Back pain   Significant Hospital Events   1/29 admit 1/30 transfuse 2 units PRBC  Consults:  Oncology  Procedures:  Thoracentesis left 1/31 2 L of serous fluid Thoracentesis 2/5 right 1.3 L of clear serous fluid Thoracentesis 2/6 right 1.4 L of cloudy amber-colored fluid  Significant Diagnostic Tests:  CT chest 1/29 >> decreased size of thoracic LN, increased size of pericardial soft tissue masses and multiple new lung nodules, large b/l effusions, splenomegaly  Chest x-ray 2/4 shows bilateral pleural effusion   Micro Data:  SARS CoV2 PCR 1/29 >> negative Influenza PCR 1/29 >> A and B negative Blood 1/29 >> Negative  RVP 1/29 >> negative Urine 1/29 >> negative Pneumococcal Ag 1/29 >> negative Legionella Ag 1/29 >> negative Lt pleural fluid 1/31>>no organism Lt pleural fluid 2/6 >>   Antimicrobials:  Vancomycin 1/29 x1 Cefepime 1/29>>2/2  Interim history/subjective:  No acute events overnight, states he has a more congested cough today.   Objective   Blood pressure (!) 150/71, pulse (!) 117, temperature 98.2 F (36.8 C), temperature source Oral, resp. rate (!) 32, height 5\' 11"  (1.803 m), weight 67.1 kg, SpO2 93 %.        Intake/Output Summary (Last 24 hours) at 11/09/2019  0916 Last data filed at 11/09/2019 0800 Gross per 24 hour  Intake 812.04 ml  Output 2575 ml  Net -1762.96 ml   Filed Weights   11/06/19 0500 11/08/19 0400 11/09/19 0342  Weight: 67.8 kg 67.1 kg 67.1 kg    Examination: General: Chronically ill appearing elderly thin deconditioned male sitting up in bed, in NAD HEENT: [place]/AT, MM pink/moist, PERRL, poor dentition Neuro: Alert and oriented x3, non-focal  CV: s1s2 regular rate and rhythm, no murmur, rubs, or gallops,  PULM:  Slight rhonchi anterior chest that clears with coughing, crackles to bilateral bases, oxygen saturations 90-97 on room air  GI: soft, bowel sounds active in all 4 quadrants, non-tender, non-distended Extremities: warm/dry, 2+ lower extremity  edema  Skin: no rashes or lesions  Resolved Hospital Problem list     Assessment & Plan:  Acute hypoxic respiratory failure from allergic reaction to obinutuzumab  Bilateral pleural effusion s/p bilateral thoracentesis x3 -During admission thus far patient has required 3 thoracentesis on the left and 1 on the right with large volume removal. Pathology review of pleural fluid from 1/31 consistent with lymphoma  P: Continue with both lasix and added aldactone 2/8 Will undergo placement of bilateral Pleurex drains  Supplemental oxygen as needed  Encourage pulmonary hygiene  Follow CXR as needed  Continue bronchodilators  Continue Prednisone at 20mg    CLL/B-cell lymphoma Anemia  -Dermatology and oncology following P: Hemoglobin stable  Chemo plan per oncology  Left lower extremity DVT -Seen on doppler 2/8 P: Placed on heparin  drip 2/8 Hold heparin drip 3hrs prior to Pleurex placement  Consult TOC to determine insurance coverage for DOAC   Sinus tachycardia P: Continuous telemetry  Continue home Cardizem   Anxiety P: Xanana  Best practice:  Diet: regular diet DVT prophylaxis: scd GI prophylaxis: ppi Mobility: bed rest Code status: full code Disposition:  step down unit  Labs:   CMP Latest Ref Rng & Units 11/08/2019 11/04/2019 11/03/2019  Glucose 70 - 99 mg/dL 204(H) 126(H) 121(H)  BUN 6 - 20 mg/dL 26(H) 25(H) 23(H)  Creatinine 0.61 - 1.24 mg/dL 0.58(L) 0.67 0.60(L)  Sodium 135 - 145 mmol/L 134(L) 134(L) 135  Potassium 3.5 - 5.1 mmol/L 4.1 4.3 4.2  Chloride 98 - 111 mmol/L 89(L) 96(L) 97(L)  CO2 22 - 32 mmol/L 30 25 26   Calcium 8.9 - 10.3 mg/dL 9.1 9.2 9.1  Total Protein 6.5 - 8.1 g/dL 4.5(L) - -  Total Bilirubin 0.3 - 1.2 mg/dL 0.6 - -  Alkaline Phos 38 - 126 U/L 171(H) - -  AST 15 - 41 U/L 20 - -  ALT 0 - 44 U/L 33 - -    CBC Latest Ref Rng & Units 11/09/2019 11/08/2019 11/04/2019  WBC 4.0 - 10.5 K/uL 3.0(L) 2.8(L) 4.0  Hemoglobin 13.0 - 17.0 g/dL 9.0(L) 9.7(L) 10.4(L)  Hematocrit 39.0 - 52.0 % 27.8(L) 30.3(L) 33.1(L)  Platelets 150 - 400 K/uL 77(L) 80(L) 115(L)   Scheduled Meds: . acalabrutinib  100 mg Oral BID  . allopurinol  300 mg Oral Daily  . chlorhexidine  15 mL Mouth Rinse BID  . Chlorhexidine Gluconate Cloth  6 each Topical Daily  . diltiazem  60 mg Oral Q8H  . famotidine  20 mg Oral BID  . folic acid  1 mg Oral Daily  . furosemide  40 mg Intravenous Q12H  . guaiFENesin  600 mg Oral BID  . insulin aspart  0-15 Units Subcutaneous TID WC  . insulin aspart  0-5 Units Subcutaneous QHS  . ipratropium  0.5 mg Nebulization TID  . levalbuterol  1.25 mg Nebulization TID  . mouth rinse  15 mL Mouth Rinse q12n4p  . nicotine  14 mg Transdermal Daily  . predniSONE  20 mg Oral BID WC  . sodium chloride flush  3 mL Intravenous Q12H  . spironolactone  50 mg Oral BID   Continuous Infusions: . sodium chloride Stopped (10/31/19 0746)  . heparin 1,250 Units/hr (11/09/19 0800)   PRN Meds:.sodium chloride, acetaminophen, alum & mag hydroxide-simeth, HYDROcodone-acetaminophen, hydrOXYzine, ipratropium-albuterol, menthol-cetylpyridinium, phenol, phenylephrine-shark liver oil-mineral oil-petrolatum, senna, sodium chloride flush,  traMADol  Scheduled Meds: . acalabrutinib  100 mg Oral BID  . allopurinol  300 mg Oral Daily  . chlorhexidine  15 mL Mouth Rinse BID  . Chlorhexidine Gluconate Cloth  6 each Topical Daily  . diltiazem  60 mg Oral Q8H  . famotidine  20 mg Oral BID  . folic acid  1 mg Oral Daily  . furosemide  40 mg Intravenous Q12H  . guaiFENesin  600 mg Oral BID  . insulin aspart  0-15 Units Subcutaneous TID WC  . insulin aspart  0-5 Units Subcutaneous QHS  . ipratropium  0.5 mg Nebulization TID  . levalbuterol  1.25 mg Nebulization TID  . mouth rinse  15 mL Mouth Rinse q12n4p  . nicotine  14 mg Transdermal Daily  . predniSONE  20 mg Oral BID WC  . sodium chloride flush  3 mL Intravenous Q12H  . spironolactone  50 mg Oral BID  Continuous Infusions: . sodium chloride Stopped (10/31/19 0746)  . heparin 1,250 Units/hr (11/09/19 0800)   PRN Meds:.sodium chloride, acetaminophen, alum & mag hydroxide-simeth, HYDROcodone-acetaminophen, hydrOXYzine, ipratropium-albuterol, menthol-cetylpyridinium, phenol, phenylephrine-shark liver oil-mineral oil-petrolatum, senna, sodium chloride flush, traMADol   ABG    Component Value Date/Time   PHART 7.404 10/29/2019 2140   PCO2ART 42.5 10/29/2019 2140   PO2ART 59.5 (L) 10/29/2019 2140   HCO3 26.0 10/29/2019 2140   ACIDBASEDEF 1.4 10/29/2019 1404   O2SAT 87.4 10/29/2019 2140    CBG (last 3)  Recent Labs    11/08/19 1659 11/08/19 2058 11/09/19 0741  GLUCAP 130* 154* 26*   Signature  Johnsie Cancel, NP-C [facility] Contact / Pager information can be found on Amion  11/09/2019, 9:16 AM

## 2019-11-09 NOTE — Procedures (Addendum)
PleurX Catheter Instertion Procedure Note  Date of Operation: 11/09/2019  Pre-op Diagnosis: Left malignant effusion with lymphoma  Post-op Diagnosis: Same  Surgeon: Leanna Sato. Antony Contras MD  Assistants: none   Anesthesia: None  Meds Given:1% lidocaine 1 5 cc total  Operation: PleurX catheter insertion on the left seventh rib space anterior axillary line  Estimated Blood Loss: Minimal   Complications: none noted  Indications and History: Isaac Pierce is 59 y.o. with a history of malignant pleural effusion.  Recommendation was to perform Pleurx catheter insertion. The risks, benefits, potential complications, treatment options, and expected outcomes were discussed with the patient.  The possibilities of pneumothorax, infection, or damage to surrounding structures were discussed with the patient who freely signed the consent.    Description of Procedure: The patient was seen in the preoperative area, was examined and was deemed appropriate to proceed.  The patient was  in the ICU, identified as Isaac Pierce with date of birth 1961-02-22  and MRN [id number], and the procedure verified as Pleurx catheter placement.  A Time Out was held and the above information confirmed.   Conscious sedation was initiated as indicated above.  With the patient in the semirecumbent position, ultrasound was used to locate fluid in the pleural space.  The site was marked and the area thoroughly cleaned with chlorhexidine.  The area was anesthetized with 1% lidocaine, including through subcutaneous tissues to the pleura.  Pleural fluid was withdrawn with the finder needle.  The procedure needle was advanced through the same tract into the pleural fluid, where fluid was again withdrawn.  The guidewire was advanced into the pleural space with subsequent removal of the needle.  A few centimeters anteriorly a skin incision was made, followed by a skin incision by the guidewire.  A Pleurx catheter attached to a soft  tissue dissector was advanced through the anterior incision with tunneling through the subcutaneous tissues to the posterior incision.  The Pleurx catheter was placed with the cuff appropriately about 1 cm from the anterior incision.  The soft tissue dissector was separated from  the Pleurx catheter.  Dilator was advanced over the guidewire into the pleural space and withdrawn.  The guidewire and peel-away catheter were advanced over the guidewire into the pleural space, followed by removal of the dilator and guidewire while leaving the peel-away catheter in place.  The Pleurx catheter was then advanced through the peel-away catheter with the catheter removed in its entirety. The pleurX catheter was connected to a sterile pleurX bottle with successful drainage of pleural fluid. The catheter was secured in place with sutures. The posterior incision was closed with 1 suture. Following successful drainage of 1000 cc of pleural fluid, the bottle was removed from the catheter. A sterile cap was placed over the catheter opening and a sterile dressing was applied.     Plans:  Home health has been ordered, as well as bottles for home drainage. Follow up in Pulmonary clinic has been arranged .  Procedure was performed under supervision of Dr. Mikeal Hawthorne V. Alva 11/09/19 4:28 PM [facility]    I directly supervised placement of both catheters. No complications. CXR with appropriately positioned catheters.  Julian Hy, DO 11/09/19 5:56 PM [facility]

## 2019-11-09 NOTE — Progress Notes (Signed)
[facility] for Heparin>> Eliquis Indication: DVT  Allergies  Allergen Reactions  . Obinutuzumab Shortness Of Breath    Reaction on 08/28/2020  . Rituximab Rash    Had rash, anxiety, HTN & tachycardia ~25min into 1st infusion 08/27/19    Patient Measurements: Height: 5\' 11"  (180.3 cm) Weight: 147 lb 14.9 oz (67.1 kg) IBW/kg (Calculated) : 75.3 Heparin Dosing Weight: actual body weight  Vital Signs: Temp: 98.9 F (37.2 C) (02/09 1200) Temp Source: Oral (02/09 1200) BP: 150/71 (02/09 0600) Pulse Rate: 117 (02/09 0600)  Labs: Recent Labs    11/08/19 1200 11/08/19 1959 11/09/19 0410 11/09/19 1138  HGB 9.7*  --  9.0*  --   HCT 30.3*  --  27.8*  --   PLT 80*  --  77*  --   APTT  --  25  --   --   LABPROT  --  14.1  --   --   INR  --  1.1  --   --   HEPARINUNFRC  --   --  <0.10* <0.10*  CREATININE 0.58*  --   --   --     Estimated Creatinine Clearance: 97.9 mL/min (A) (by C-G formula based on SCr of 0.58 mg/dL (L)).  Medications:  No oral anticoagulation PTA  Assessment:  59 yr male admitted on 10/29/19 with acute respiratory failure following Mab Gazyva infusion  + LLE DVT found today  Pharmacy consulted to dose IV heparin  Plan for B Pleurx catheter placement on 2/9  11/09/2019 Heparin off at 11 am.  Dr. Carlis Abbott to place B Pleurex drains today.  OK to start Eliquis tonight per Dr. Carlis Abbott.  Hg 9, PLTC 77.  No bleeding reported.    Goal of Therapy:  Heparin level 0.3-0.7 units/ml Monitor platelets by anticoagulation protocol: Yes   Plan:  Start Eliquis 10 mg po bid x 7 days tonight at 2200, then Eliquis 5 mg po bid Will educate and give 30 day free card prior to discharge  Eudelia Bunch, Pharm.D [phone] 11/09/2019 2:07 PM

## 2019-11-09 NOTE — Progress Notes (Signed)
ANTICOAGULATION CONSULT NOTE - Follow Up Consult  Pharmacy Consult for Heparin Indication: DVT  Allergies  Allergen Reactions  . Obinutuzumab Shortness Of Breath    Reaction on 08/28/2020  . Rituximab Rash    Had rash, anxiety, HTN & tachycardia ~41min into 1st infusion 08/27/19    Patient Measurements: Height: 5\' 11"  (180.3 cm) Weight: 147 lb 14.9 oz (67.1 kg) IBW/kg (Calculated) : 75.3 Heparin Dosing Weight:   Vital Signs: Temp: 97.6 F (36.4 C) (02/09 0400) Temp Source: Oral (02/09 0400) BP: 111/54 (02/09 0400) Pulse Rate: 107 (02/09 0400)  Labs: Recent Labs    11/08/19 1200 11/08/19 1959 11/09/19 0410  HGB 9.7*  --  9.0*  HCT 30.3*  --  27.8*  PLT 80*  --  77*  APTT  --  25  --   LABPROT  --  14.1  --   INR  --  1.1  --   HEPARINUNFRC  --   --  <0.10*  CREATININE 0.58*  --   --     Estimated Creatinine Clearance: 97.9 mL/min (A) (by C-G formula based on SCr of 0.58 mg/dL (L)).   Medications:  Infusions:  . sodium chloride Stopped (10/31/19 0746)  . heparin 1,100 Units/hr (11/09/19 0400)    Assessment: Patient with low heparin level.  No heparin issues per RN.  Goal of Therapy:  Heparin level 0.3-0.7 units/ml Monitor platelets by anticoagulation protocol: Yes   Plan:  Increase heparin to 1250 units/hr Recheck level at 1200 Follow up with planned procedure and desired heparin stop time.  Tyler Deis, Shea Stakes [place] 11/09/2019,5:09 AM

## 2019-11-09 NOTE — Telephone Encounter (Signed)
Sure. I am fine with whatever we need to do to get him bottles. If we don't have the comprehensive HH stuff set up, that's fine. Can you see if his insurance covers the bottles? We would just need to know soon if he will need financial assistance.  Arby Barrette

## 2019-11-09 NOTE — Discharge Instructions (Signed)
Information on my medicine - ELIQUIS (apixaban)  This medication education was reviewed with me or my healthcare representative as part of my discharge preparation.  Why was Eliquis prescribed for you? Eliquis was prescribed to treat blood clots that may have been found in the veins of your legs (deep vein thrombosis) or in your lungs (pulmonary embolism) and to reduce the risk of them occurring again.  What do You need to know about Eliquis ? The starting dose is 10 mg (two 5 mg tablets) taken TWICE daily for the FIRST SEVEN (7) DAYS, then on Wednesday 11/17/19  the dose is reduced to ONE 5 mg tablet taken TWICE daily.  Eliquis may be taken with or without food.   Try to take the dose about the same time in the morning and in the evening. If you have difficulty swallowing the tablet whole please discuss with your pharmacist how to take the medication safely.  Take Eliquis exactly as prescribed and DO NOT stop taking Eliquis without talking to the doctor who prescribed the medication.  Stopping may increase your risk of developing a new blood clot.  Refill your prescription before you run out.  After discharge, you should have regular check-up appointments with your healthcare provider that is prescribing your Eliquis.    What do you do if you miss a dose? If a dose of ELIQUIS is not taken at the scheduled time, take it as soon as possible on the same day and twice-daily administration should be resumed. The dose should not be doubled to make up for a missed dose.  Important Safety Information A possible side effect of Eliquis is bleeding. You should call your healthcare provider right away if you experience any of the following: ? Bleeding from an injury or your nose that does not stop. ? Unusual colored urine (red or dark brown) or unusual colored stools (red or black). ? Unusual bruising for unknown reasons. ? A serious fall or if you hit your head (even if there is no  bleeding).  Some medicines may interact with Eliquis and might increase your risk of bleeding or clotting while on Eliquis. To help avoid this, consult your healthcare provider or pharmacist prior to using any new prescription or non-prescription medications, including herbals, vitamins, non-steroidal anti-inflammatory drugs (NSAIDs) and supplements.  This website has more information on Eliquis (apixaban): http://[email or web address].[email or web address]/home

## 2019-11-09 NOTE — Progress Notes (Signed)
HEMATOLOGY-ONCOLOGY PROGRESS NOTE  SUBJECTIVE: Continues to feel well today.  No longer requiring oxygen.  Scheduled to have bilateral Pleurx catheter placements later today by PCCM.  Patient has a new DVT in the left lower extremity.  He is currently on a heparin drip.  Plans to transition to [place] after Pleurx catheters have been placed.  Left chest lymph node is smaller today.  Abdomen still distended.  He has no other complaints this morning.  Oncology History  Non-Hodgkin lymphoma ([facility])  08/25/2019 Initial Diagnosis   Non-Hodgkin lymphoma ([place])   08/25/2019 - 08/31/2019 Chemotherapy   The patient had riTUXimab (RITUXAN) 700 mg in sodium chloride 0.9 % 250 mL (2.1875 mg/mL) infusion, 375 mg/m2 = 700 mg (100 % of original dose 375 mg/m2), Intravenous,  Once, 1 of 1 cycle Dose modification: 375 mg/m2 (original dose 375 mg/m2, Cycle 1) Administration: 700 mg (08/25/2019) riTUXimab-pvvr (RUXIENCE) 700 mg in sodium chloride 0.9 % 250 mL (2.1875 mg/mL) infusion, 375 mg/m2 = 700 mg (100 % of original dose 375 mg/m2), Intravenous,  Once, 0 of 3 cycles Dose modification: 375 mg/m2 (original dose 375 mg/m2, Cycle 2), 375 mg/m2 (original dose 375 mg/m2, Cycle 3)  for chemotherapy treatment.    09/01/2019 - 10/12/2019 Chemotherapy   The patient had DOXOrubicin (ADRIAMYCIN) chemo injection 68 mg, 37.5 mg/m2 = 68 mg (75 % of original dose 50 mg/m2), Intravenous,  Once, 2 of 6 cycles Dose modification: 37.5 mg/m2 (75 % of original dose 50 mg/m2, Cycle 1, Reason: Provider Judgment) Administration: 68 mg (09/01/2019), 68 mg (09/22/2019) palonosetron (ALOXI) injection 0.25 mg, 0.25 mg, Intravenous,  Once, 2 of 6 cycles Administration: 0.25 mg (09/01/2019), 0.25 mg (09/22/2019) pegfilgrastim-cbqv (UDENYCA) injection 6 mg, 6 mg, Subcutaneous, Once, 1 of 5 cycles Administration: 6 mg (09/23/2019) vinCRIStine (ONCOVIN) 1.5 mg in sodium chloride 0.9 % 50 mL chemo infusion, 1.5 mg (75 % of original dose 2 mg),  Intravenous,  Once, 2 of 6 cycles Dose modification: 1.5 mg (75 % of original dose 2 mg, Cycle 1, Reason: Provider Judgment) Administration: 1.5 mg (09/01/2019), 1.5 mg (09/22/2019) riTUXimab (RITUXAN) 700 mg in sodium chloride 0.9 % 250 mL (2.1875 mg/mL) infusion, 375 mg/m2 = 700 mg (100 % of original dose 375 mg/m2), Intravenous,  Once, 1 of 1 cycle Dose modification: 375 mg/m2 (original dose 375 mg/m2, Cycle 1) Administration: 700 mg (09/01/2019) cyclophosphamide (CYTOXAN) 1,020 mg in sodium chloride 0.9 % 250 mL chemo infusion, 562.5 mg/m2 = 1,020 mg (75 % of original dose 750 mg/m2), Intravenous,  Once, 2 of 6 cycles Dose modification: 562.5 mg/m2 (75 % of original dose 750 mg/m2, Cycle 1, Reason: Provider Judgment) Administration: 1,020 mg (09/01/2019), 1,020 mg (09/22/2019) fosaprepitant (EMEND) 150 mg, dexamethasone (DECADRON) 12 mg in sodium chloride 0.9 % 145 mL IVPB, , Intravenous,  Once, 2 of 6 cycles Administration:  (09/01/2019),  (09/22/2019) riTUXimab-pvvr (RUXIENCE) 700 mg in sodium chloride 0.9 % 250 mL (2.1875 mg/mL) infusion, 375 mg/m2 = 700 mg, Intravenous,  Once, 1 of 1 cycle  for chemotherapy treatment.    10/22/2019 - 10/29/2019 Chemotherapy   The patient had obinutuzumab (GAZYVA) 100 mg in sodium chloride 0.9 % 100 mL (0.9615 mg/mL) chemo infusion, 100 mg, Intravenous, Once, 1 of 6 cycles Administration: 100 mg (10/22/2019), 1,000 mg (10/29/2019)  for chemotherapy treatment.       REVIEW OF SYSTEMS:   Noncontributory except as noted in the HPI.  PHYSICAL EXAMINATION: ECOG PERFORMANCE STATUS: 1 - Symptomatic but completely ambulatory  Vitals:   11/09/19  0802 11/09/19 0823  BP:    Pulse:    Resp:    Temp: 98.2 F (36.8 C)   SpO2:  93%   Filed Weights   11/06/19 0500 11/08/19 0400 11/09/19 0342  Weight: 149 lb 7.6 oz (67.8 kg) 147 lb 14.9 oz (67.1 kg) 147 lb 14.9 oz (67.1 kg)    Intake/Output from previous day: 02/08 0701 - 02/09 0700 In: 799.5 [P.O.:640;  I.V.:159.5] Out: 2875 [Urine:2875]  GENERAL:alert, no distress LYMPH: Persistent left axillary lymphadenopathy LUNGS: Crackles bilateral bases, diminished left base HEART: Tachycardic on monitor, 2+ bilateral lower extremity edema ABDOMEN: Positive bowel sounds, mildly distended, nontender, hepatosplenomegaly noted Musculoskeletal:no cyanosis of digits and no clubbing  NEURO: alert & oriented x 3 with fluent speech, no focal motor/sensory deficits  LABORATORY DATA:  I have reviewed the data as listed CMP Latest Ref Rng & Units 11/08/2019 11/04/2019 11/03/2019  Glucose 70 - 99 mg/dL 204(H) 126(H) 121(H)  BUN 6 - 20 mg/dL 26(H) 25(H) 23(H)  Creatinine 0.61 - 1.24 mg/dL 0.58(L) 0.67 0.60(L)  Sodium 135 - 145 mmol/L 134(L) 134(L) 135  Potassium 3.5 - 5.1 mmol/L 4.1 4.3 4.2  Chloride 98 - 111 mmol/L 89(L) 96(L) 97(L)  CO2 22 - 32 mmol/L 30 25 26   Calcium 8.9 - 10.3 mg/dL 9.1 9.2 9.1  Total Protein 6.5 - 8.1 g/dL 4.5(L) - -  Total Bilirubin 0.3 - 1.2 mg/dL 0.6 - -  Alkaline Phos 38 - 126 U/L 171(H) - -  AST 15 - 41 U/L 20 - -  ALT 0 - 44 U/L 33 - -    Lab Results  Component Value Date   WBC 3.0 (L) 11/09/2019   HGB 9.0 (L) 11/09/2019   HCT 27.8 (L) 11/09/2019   MCV 89.7 11/09/2019   PLT 77 (L) 11/09/2019   NEUTROABS 2.2 11/08/2019    CT CHEST WO CONTRAST  Result Date: 10/29/2019 CLINICAL DATA:  With lymphoma, abnormal radiograph EXAM: CT CHEST WITHOUT CONTRAST TECHNIQUE: Multidetector CT imaging of the chest was performed following the standard protocol without IV contrast. COMPARISON:  Radiograph 10/28/2018, CT 08/25/2019 FINDINGS: Cardiovascular: Left IJ approach Port-A-Cath is accessed with the tip positioned at the right atrium. Normal cardiac size. Redemonstration of the extensive pericardial soft tissue attenuation lesions these appear to extend more superiorly towards the cardiac base than on comparison study including a larger crescentic amount of soft tissue measuring  approximately 6.9 cm anteroposterior and 1.7 cm in thickness near the level of the pulmonary trunk (2/79). Central pulmonary arteries are normal caliber. Atherosclerotic plaque within the normal caliber aorta. Normal 3 vessel branching of the aortic arch with minimal plaque in the great vessels. Mediastinum/Nodes: Decrease in the size of bulky thoracic lymphadenopathy compatible with patient's known lymphoproliferative disorder. Index lymph nodes include: *A high right paratracheal node measuring 12 mm short axis (2/45), previously 23 mm *AP window lymph node measuring 24 mm (4/35), previously 29 mm *Right hilar node measuring 15 mm (2/85), previously 21 mm *Subcarinal node measuring 18 mm (2/69), previously 26 mm *Right inter lobar lymph node on comparison is difficult to assess given absence of contrast media. Thyroid gland is unremarkable. No acute abnormality of the trachea or esophagus. Lungs/Pleura: Large bilateral pleural effusions are noted. Dense bandlike areas of atelectasis are noted in both lungs. Multiple nodules are seen in the right middle and lower lobe examples include a 8 mm nodule laterally (4/102) in lower lobe and a subpleural 6 mm nodule anteriorly in the right middle lobe (  4/98). No visible nodules seen in the left lung though underlying atelectasis could obscure the presence of nodules or masses. Upper Abdomen: Marked splenomegaly. Some wedge-shaped hypoattenuation in the periphery of the spleen measuring 1.6 x 3.2 cm, compatible with sequela of remote splenic infarct. Indeterminate exophytic 12 mm lesion arising from the upper pole right kidney. Musculoskeletal: Multilevel degenerative changes are present in the imaged portions of the spine. Evaluation of the chest wall limited given extensive motion artifact which may limit detection of sternal and rib fractures. No gross acute osseous abnormality or suspicious osseous lesions. IMPRESSION: 1. Mixed response of patient's lymphoproliferative  disease with overall decrease in the size of bulky thoracic lymphadenopathy but with increasing pericardial soft tissue masses and multiple new lung nodules. 2. Large bilateral pleural effusions with associated compressive atelectasis. 3. 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 4. Indeterminate 12 mm lesion arising from the upper pole of the right kidney. Indeterminate on noncontrast CT. Could consider follow-up with nonemergent outpatient MR or CT. 5. Evaluation of the chest wall limited given extensive motion artifact which may limit detection of sternal and rib fractures. No gross acute osseous abnormality. 6.  Aortic Atherosclerosis (ICD10-I70.0). Electronically Signed   By: Lovena Le M.D.   On: 10/29/2019 20:36   DG CHEST PORT 1 VIEW  Result Date: 11/08/2019 CLINICAL DATA:  History of prior effusions EXAM: PORTABLE CHEST 1 VIEW COMPARISON:  11/06/2019 FINDINGS: Cardiac shadow is stable. Right chest wall port is again seen. Slight increase in right-sided pleural effusion is noted. Stable left effusion is seen. Bibasilar atelectatic changes are noted. No bony abnormality is seen. IMPRESSION: Slight increase in right-sided pleural effusion when compared with the prior exam. Electronically Signed   By: Inez Catalina M.D.   On: 11/08/2019 13:35   DG Chest Port 1 View  Result Date: 11/06/2019 CLINICAL DATA:  Status post right thoracentesis. EXAM: PORTABLE CHEST 1 VIEW COMPARISON:  November 05, 2019. FINDINGS: Stable cardiomediastinal silhouette. Right internal jugular Port-A-Cath is unchanged. No pneumothorax is noted. Right pleural effusion is smaller status post thoracentesis. Stable small left pleural effusion with associated atelectasis. Bony thorax is unremarkable. IMPRESSION: No pneumothorax status post right thoracentesis. Right pleural effusion is smaller. Stable small left pleural effusion with associated atelectasis. Electronically Signed   By: Marijo Conception M.D.   On:  11/06/2019 12:17   DG Chest Port 1 View  Result Date: 11/05/2019 CLINICAL DATA:  Status post left thoracentesis EXAM: PORTABLE CHEST 1 VIEW COMPARISON:  11/04/2019 FINDINGS: Slight interval reduction in volume of a left pleural effusion, now small, with associated atelectasis or consolidation. No pneumothorax. Unchanged moderate, layering right pleural effusion and associated atelectasis or consolidation. Right chest port catheter. IMPRESSION: Slight interval reduction in volume of a left pleural effusion, now small, with associated atelectasis or consolidation. No pneumothorax. Electronically Signed   By: Eddie Candle M.D.   On: 11/05/2019 19:06   DG CHEST PORT 1 VIEW  Result Date: 11/04/2019 CLINICAL DATA:  Shortness of breath EXAM: PORTABLE CHEST 1 VIEW COMPARISON:  11/04/2019 FINDINGS: Bilateral pleural effusions are again seen. Vascular congestion is noted. Right chest wall port is again noted. The overall appearance is similar to that seen on the prior exam. IMPRESSION: Stable bilateral pleural effusions and vascular congestion similar to that seen on the prior exam. Electronically Signed   By: Inez Catalina M.D.   On: 11/04/2019 23:52   DG Chest Port 1 View  Result Date: 11/04/2019 CLINICAL DATA:  Follow-up pleural  effusions. EXAM: PORTABLE CHEST 1 VIEW COMPARISON:  Multiple previous chest films. The most recent is 11/01/2019 FINDINGS: The right IJ power port is stable. Persistent moderate-sized bilateral pleural effusions with significant overlying atelectasis. Central vascular congestion and possible mild perihilar interstitial edema. IMPRESSION: Persistent moderate-sized bilateral pleural effusions with significant overlying atelectasis. Electronically Signed   By: Marijo Sanes M.D.   On: 11/04/2019 06:14   DG CHEST PORT 1 VIEW  Result Date: 11/01/2019 CLINICAL DATA:  Status post thoracentesis EXAM: PORTABLE CHEST 1 VIEW COMPARISON:  10/31/19 FINDINGS: Cardiac shadow is stable. Right chest wall  port is noted. Small pleural effusions are noted bilaterally with basilar atelectasis. No pneumothorax is seen. IMPRESSION: Small pleural effusions bilaterally with basilar atelectasis. No pneumothorax is identified. Electronically Signed   By: Inez Catalina M.D.   On: 11/01/2019 01:48   DG Chest Port 1 View  Result Date: 10/31/2019 CLINICAL DATA:  Status post left thoracentesis. EXAM: PORTABLE CHEST 1 VIEW COMPARISON:  October 31, 2019 FINDINGS: The left-sided pleural effusion is smaller after thoracentesis. No pneumothorax. Bilateral pleural effusions remain. The right Port-A-Cath is stable. No other interval changes. IMPRESSION: The left-sided pleural effusion is smaller after thoracentesis. No pneumothorax. No other changes. Electronically Signed   By: Dorise Bullion III M.D   On: 10/31/2019 10:29   DG Chest Port 1 View  Result Date: 10/31/2019 CLINICAL DATA:  Follow-up pleural effusion EXAM: PORTABLE CHEST 1 VIEW COMPARISON:  10/30/2019 FINDINGS: Power port unchanged. Bilateral effusions, larger on the left than the right. Volume loss in the lower lungs, worse on the left than the right. No new radiographic finding. IMPRESSION: No change. Large bilateral effusions left more than right with dependent volume loss left more than right. Electronically Signed   By: Nelson Chimes M.D.   On: 10/31/2019 06:00   DG Chest Port 1 View  Result Date: 10/30/2019 CLINICAL DATA:  Respiratory failure. EXAM: PORTABLE CHEST 1 VIEW COMPARISON:  Chest radiograph 10/29/2019 FINDINGS: Right anterior chest wall Port-A-Cath is present with tip projecting over the superior vena cava. Monitoring leads overlie the patient. Stable enlarged cardiac and mediastinal contours. Persistent moderate to large layering bilateral pleural effusions with underlying pulmonary consolidation. No pneumothorax. IMPRESSION: Persistent moderate to large layering bilateral effusions with underlying consolidation. Electronically Signed   By: Lovey Newcomer M.D.   On: 10/30/2019 04:58   DG Chest Port 1 View  Result Date: 10/29/2019 CLINICAL DATA:  Shortness of breath EXAM: PORTABLE CHEST 1 VIEW COMPARISON:  09/01/2019, CT 08/25/2019 FINDINGS: Right-sided central venous port tip over the cavoatrial region allowing for rotated patient. Suspected moderate layering right pleural effusion, probably increased from prior radiograph. Moderate left pleural effusion, also suspect increase in size. Enlarged cardiomediastinal silhouette with vascular congestion. Consolidation at the left lung base. Fluid in the right fissure. Hazy appearance of both thoraces likely due to layering fluid although underlying pulmonary edema could be contributing. No pneumothorax. IMPRESSION: Hazy appearance of the bilateral lungs since 09/01/2019, suspected to be secondary to moderate layering pleural effusions, probably increased in the interim. Cardiomegaly with vascular congestion; hazy lung appearance could also be due to associated edema. Atelectasis or pneumonia at the left base. Electronically Signed   By: Donavan Foil M.D.   On: 10/29/2019 15:19   VAS [place] LOWER EXTREMITY VENOUS (DVT)  Result Date: 11/08/2019  Lower Venous DVTStudy Indications: Swelling.  Limitations: Poor ultrasound/tissue interface. Comparison Study: 08/18/2019 - Negative for DVT. Performing Technologist: Oliver Hum RVT  Examination Guidelines: A complete evaluation  includes B-mode imaging, spectral Doppler, color Doppler, and power Doppler as needed of all accessible portions of each vessel. Bilateral testing is considered an integral part of a complete examination. Limited examinations for reoccurring indications may be performed as noted. The reflux portion of the exam is performed with the patient in reverse Trendelenburg.  +---------+---------------+---------+-----------+----------+--------------+ RIGHT    CompressibilityPhasicitySpontaneityPropertiesThrombus Aging  +---------+---------------+---------+-----------+----------+--------------+ CFV      Full           Yes      Yes                                 +---------+---------------+---------+-----------+----------+--------------+ SFJ      Full                                                        +---------+---------------+---------+-----------+----------+--------------+ FV Prox  Full                                                        +---------+---------------+---------+-----------+----------+--------------+ FV Mid   Full                                                        +---------+---------------+---------+-----------+----------+--------------+ FV DistalFull                                                        +---------+---------------+---------+-----------+----------+--------------+ PFV      Full                                                        +---------+---------------+---------+-----------+----------+--------------+ POP      Full           Yes      Yes                                 +---------+---------------+---------+-----------+----------+--------------+ PTV      Full                                                        +---------+---------------+---------+-----------+----------+--------------+ PERO     Full                                                        +---------+---------------+---------+-----------+----------+--------------+   +---------+---------------+---------+-----------+----------+--------------+  LEFT     CompressibilityPhasicitySpontaneityPropertiesThrombus Aging +---------+---------------+---------+-----------+----------+--------------+ CFV      Full           Yes      Yes                                 +---------+---------------+---------+-----------+----------+--------------+ SFJ      Full                                                         +---------+---------------+---------+-----------+----------+--------------+ FV Prox  Full                                                        +---------+---------------+---------+-----------+----------+--------------+ FV Mid   Full                                                        +---------+---------------+---------+-----------+----------+--------------+ FV DistalFull                                                        +---------+---------------+---------+-----------+----------+--------------+ PFV      Full                                                        +---------+---------------+---------+-----------+----------+--------------+ POP      None           No       No                   Acute          +---------+---------------+---------+-----------+----------+--------------+ PTV      Partial                                      Acute          +---------+---------------+---------+-----------+----------+--------------+ PERO     Partial                                      Acute          +---------+---------------+---------+-----------+----------+--------------+ Gastroc  Partial                                      Acute          +---------+---------------+---------+-----------+----------+--------------+     Summary: RIGHT: - There is no evidence of deep vein thrombosis  in the lower extremity.  - No cystic structure found in the popliteal fossa. - Ultrasound characteristics of enlarged lymph nodes are noted in the groin.  LEFT: - Findings consistent with acute deep vein thrombosis involving the left popliteal vein, left posterior tibial veins, left peroneal veins, and left gastrocnemius veins. - No cystic structure found in the popliteal fossa.  *See table(s) above for measurements and observations.    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   He had bulky lymphadenopathy in the chest and abdomen as well as hepatosplenomegaly and involvement of the bone marrow.  He also had significant pancytopenia on presentation.  He was initially tried on single agent Rituxan but this was discontinued secondary to hypersensitivity reaction.  He received 2 cycles of CHOP, but did not respond well.  His chemotherapy was switched to Obinutuzumab and acalabrutinib.  He had an infusion reaction to obinutuzumab and is now hospitalized.    Status post multiple thoracenteses.  Cytology from left thoracentesis on 10/31/2019 showed findings consistent with lymphoma.  Cytology from right-sided thoracentesis on 11/06/2019 is pending.  Breathing appears improved.  He will have bilateral Pleurx catheters placed later today.  Patient started on Calquence 100 mg twice a day on 11/04/2019.  He is tolerating this well overall.  CBC from today showed a mildly low white blood cell count, hemoglobin of 9.0, and platelet count of 77,000.  This is likely related to his chemotherapy and will continue to monitor this.  No transfusion is indicated.  The patient has a new left lower extremity DVT and is currently on a heparin drip. Okay from our point to transition to [place] after Pleurx catheters have in place.  Discussed with PCCM.   LOS: 11 days   Mikey Bussing, DNP, AGPCNP-BC, AOCNP 11/09/19

## 2019-11-10 LAB — COMPREHENSIVE METABOLIC PANEL
ALT: 28 U/L (ref 0–44)
AST: 20 U/L (ref 15–41)
Albumin: 1.9 g/dL — ABNORMAL LOW (ref 3.5–5.0)
Alkaline Phosphatase: 165 U/L — ABNORMAL HIGH (ref 38–126)
Anion gap: 8 (ref 5–15)
BUN: 25 mg/dL — ABNORMAL HIGH (ref 6–20)
CO2: 32 mmol/L (ref 22–32)
Calcium: 8.9 mg/dL (ref 8.9–10.3)
Chloride: 90 mmol/L — ABNORMAL LOW (ref 98–111)
Creatinine, Ser: 0.56 mg/dL — ABNORMAL LOW (ref 0.61–1.24)
GFR calc Af Amer: >60 mL/min (ref >60)
GFR calc non Af Amer: >60 mL/min (ref >60)
Glucose, Bld: 98 mg/dL (ref 70–99)
Potassium: 4.3 mmol/L (ref 3.5–5.1)
Sodium: 130 mmol/L — ABNORMAL LOW (ref 135–145)
Total Bilirubin: 0.7 mg/dL (ref 0.3–1.2)
Total Protein: 4.4 g/dL — ABNORMAL LOW (ref 6.5–8.1)

## 2019-11-10 LAB — GLUCOSE, CAPILLARY
Glucose-Capillary: 91 mg/dL (ref 70–99)
Glucose-Capillary: 199 mg/dL — ABNORMAL HIGH (ref 70–99)
Glucose-Capillary: 138 mg/dL — ABNORMAL HIGH (ref 70–99)
Glucose-Capillary: 157 mg/dL — ABNORMAL HIGH (ref 70–99)

## 2019-11-10 LAB — CBC
HCT: 28.1 % — ABNORMAL LOW (ref 39.0–52.0)
Hemoglobin: 9.1 g/dL — ABNORMAL LOW (ref 13.0–17.0)
MCH: 29.4 pg (ref 26.0–34.0)
MCHC: 32.4 g/dL (ref 30.0–36.0)
MCV: 90.6 fL (ref 80.0–100.0)
Platelets: 86 10*3/uL — ABNORMAL LOW (ref 150–400)
RBC: 3.1 MIL/uL — ABNORMAL LOW (ref 4.22–5.81)
RDW: 16.9 % — ABNORMAL HIGH (ref 11.5–15.5)
WBC: 3.4 10*3/uL — ABNORMAL LOW (ref 4.0–10.5)
nRBC: 0 % (ref 0.0–0.2)

## 2019-11-10 MED ORDER — KETOROLAC TROMETHAMINE 15 MG/ML IJ SOLN
15.0000 mg | Freq: Four times a day (QID) | INTRAMUSCULAR | Status: DC
  Administered 2019-11-10 – 2019-11-12 (×8): 15 mg via INTRAVENOUS
  Filled 2019-11-10 (×8): qty 1

## 2019-11-10 NOTE — TOC Progression Note (Signed)
Transition of Care [facility]) - Progression Note    Patient Details  Name: Isaac Pierce MRN: [id number] Date of Birth: Jan 27, 1961  Transition of Care [facility]) CM/SW [place], LCSW Phone Number: 11/10/2019, 3:14 PM  Clinical Narrative:    CSW notified the RN secretary to order Pleurex drainage kit for patient to have upon discharging from the hospital (10 bottles). CSW received a call from [facility] , they cannot provide an RN to provide care for the plurex under charity.  Patient and family will need ongoing teaching to care for the pleurex.   CSW reached out to [employer], RN, it appears the [employer] order form has been filled out. Waiting for a return call to verify.   TOC staff will continue to follow this patient.    Expected Discharge Plan: [place] Services Barriers to Discharge: Other (comment)(HH will not provide RN for PleurX Care.)  Expected Discharge Plan and Services Expected Discharge Plan: [place]   Discharge Planning Services: CM Consult Post Acute Care Choice: [place] arrangements for the past 2 months: [place]: RN [facility] Agency: [place] ([place]) Date [place]: 11/09/19 Time [place]: 1418 Representative spoke with at [place]: [place] ([place]) Interventions    Readmission Risk Interventions Readmission Risk Prevention Plan 09/06/2019  Transportation Screening Complete  PCP or Specialist Appt within 5-7 Days Not Complete  Not Complete comments got earliest appointment possible at [place] Screening Complete  Medication Review (RN CM) Complete

## 2019-11-10 NOTE — Progress Notes (Signed)
Patient told staff that he does not want to have his pleurex catheters drained today due to being very sore from insertion yesterday. This RN spoke with NP Rosana Hoes and agreed that it was okay to wait until tomorrow to drain when his sister is present to also provide a teaching opportunity prior to discharge. Will continue to monitor.

## 2019-11-10 NOTE — Telephone Encounter (Signed)
Isaac Pierce (Education officer, museum)  returning a phone call. Isaac Pierce can be reached at [phone].

## 2019-11-10 NOTE — Telephone Encounter (Signed)
According to [facility] note, patient will be covered for supplies as long as they don't have a DME company they use for anything else. -nothing found in chart.   Ordering form is filled out and placed in PleurX folder with LCR, just needs a signature from Dr. Carlis Abbott.  Will fax when 1. Patient is discharged 2. When signature is obtained.  Will route to WD and PC as fyi

## 2019-11-10 NOTE — Telephone Encounter (Signed)
LMTCB for Isaac Pierce to see what she is needing for the pt

## 2019-11-10 NOTE — Telephone Encounter (Signed)
Message received from [place]   " [place], Venita Sheffield, DO  Johnsie Cancel, NP; Amado Coe, RN  [place],   Please keep Ashley Akin from our office updated on when Mr. Poorman will be discharged so we ensure his follow up in clinic will be scheduled and that we get his bottles figured out. CM at the hospital needs to make sure he goes home with his 4 bottles. I am currently recommending every other day drainage, but with b/l tubes that will still only get him 4 days.   Paige  "  atc BD rep again office closed until 1000,   Will route to triage to follow up on, we need to know if insurance is covered and if not do they just need the patient assistance paperwork, (Parv Manthey has). Once patient is discharged, we will send the order sheet

## 2019-11-10 NOTE — Telephone Encounter (Signed)
I'm at [facility]. I can try to run by this afternoon to sign something or if anyone is going to be in the neighborhood I can sign it. I just don't have anyone else here if I leave.  Isaac Pierce

## 2019-11-10 NOTE — Telephone Encounter (Signed)
Spoke with Isaac Pierce at [place] patient assistance. She states that as long as the pt does not have DME coverage with his insurance he should be approved for assistance. The only thing they will need is the patient assistance forms faxed to them once he is discharged.

## 2019-11-10 NOTE — TOC Initial Note (Signed)
Transition of Care [facility]) - Initial/Assessment Note    Patient Details  Name: Isaac Pierce MRN: [id number] Date of Birth: [birth year]  Transition of Care [facility]) CM/SW Contact:    Leeroy Cha, RN Phone Number: 11/10/2019, 3:11 PM  Clinical Narrative:                 tct-karen for rn for hhc.  WCB if can take.  RN for plurex drain.  Expected Discharge Plan: [facility]     Patient Goals and CMS Choice     Choice offered to / list presented to : NA  Expected Discharge Plan and Services Expected Discharge Plan: [place]   Discharge Planning Services: CM Consult Post Acute Care Choice: [place] arrangements for the past 2 months: [place]: RN [place] Agency: [place] ([place]) Date [place]: 11/09/19 Time [place]: 72 Representative spoke with at [place]: Eugenie Birks  Prior Living Arrangements/Services Living arrangements for the past 2 months: [place] with:: Relatives Patient language and need for interpreter reviewed:: No Do you feel safe going back to the place where you live?: Yes      Need for Family Participation in Patient Care: Yes (Comment) Care giver support system in place?: Yes (comment)   Criminal Activity/Legal Involvement Pertinent to Current Situation/Hospitalization: No - Comment as needed  Activities of Daily Living Home Assistive Devices/Equipment: Eyeglasses ADL Screening (condition at time of admission) Patient's cognitive ability adequate to safely complete daily activities?: Yes Is the patient deaf or have difficulty hearing?: No Does the patient have difficulty seeing, even when wearing glasses/contacts?: No Does the patient have difficulty concentrating, remembering, or making decisions?: No Patient able to express need for assistance with ADLs?: Yes Does the patient have difficulty dressing or  bathing?: Yes Independently performs ADLs?: No(secondary to shortness of breath) Communication: Independent Dressing (OT): Needs assistance Is this a change from baseline?: Change from baseline, expected to last >3 days Grooming: Needs assistance Is this a change from baseline?: Change from baseline, expected to last >3 days Feeding: Needs assistance Is this a change from baseline?: Change from baseline, expected to last >3 days Bathing: Needs assistance Is this a change from baseline?: Change from baseline, expected to last >3 days Toileting: Needs assistance Is this a change from baseline?: Change from baseline, expected to last >3days In/Out Bed: Needs assistance Is this a change from baseline?: Change from baseline, expected to last >3 days Walks in Home: Needs assistance Is this a change from baseline?: Change from baseline, expected to last >3 days Does the patient have difficulty walking or climbing stairs?: Yes(secondary to shortness of breath and weakness) Weakness of Legs: Both Weakness of Arms/Hands: None  Permission Sought/Granted                  Emotional Assessment Appearance:: Appears stated age Attitude/Demeanor/Rapport: Self-Confident Affect (typically observed): Accepting Orientation: : Oriented to Self, Oriented to Place, Oriented to  Time, Oriented to Situation Alcohol / Substance Use: Not Applicable Psych Involvement: No (comment)  Admission diagnosis:  Respiratory distress [R06.03] Pleural effusion, left [J90] Acute respiratory failure with hypoxia ([place]) [J96.01] Patient Active Problem List   Diagnosis Date Noted  . Bilateral pleural effusion 11/08/2019  . Acute respiratory failure with hypoxia ([place])  10/29/2019  . Port-A-Cath in place 10/28/2019  . Chemotherapy induced neutropenia ([place]) 09/08/2019  . Goals of care, counseling/discussion 09/08/2019  . Encounter for antineoplastic chemotherapy 09/08/2019  . 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 Anemia   . Shock ([place])   . Pressure injury of skin 08/26/2019  . Non-Hodgkin lymphoma ([place]) 08/25/2019  . Protein-calorie malnutrition, severe 08/19/2019  . Malignancy ([place]) 08/17/2019  . Severe sepsis ([place]) 08/17/2019  . Pancytopenia ([place]) 08/17/2019  . Dyspnea 08/17/2019  . Pleural effusion 08/17/2019  . Hyponatremia 08/17/2019   PCP:  Patient, No Pcp Per Pharmacy:   [facility] [place] [address] [place] [address] Phone: [phone] Fax: [phone]  [place], [place] [address] [place] [address] Phone: [phone] Fax: [phone]     Social Determinants of Health (SDOH) Interventions    Readmission Risk Interventions Readmission Risk Prevention Plan 09/06/2019  Transportation Screening Complete  PCP or Specialist Appt within 5-7 Days Not Complete  Not Complete comments got earliest appointment possible at [place] Screening Complete  Medication Review (RN CM) Complete

## 2019-11-10 NOTE — Progress Notes (Signed)
Nutrition Brief Note  Patient identified for LOS (day #12)  Wt Readings from Last 15 Encounters:  11/10/19 60.5 kg  10/28/19 72.4 kg  10/15/19 68.9 kg  10/14/19 68.5 kg  10/13/19 66.8 kg  09/21/19 60 kg  09/17/19 60.7 kg  09/08/19 64.6 kg  09/06/19 62.1 kg    Body mass index is 18.6 kg/m. Patient meets criteria for borderline underweight based on current BMI. Current weight is 133 lb. Weight has been fluctuating over the past 2 months, as seen above.  Current diet order is Regular. Able to talk with RN who had patient yesterday and today. She reports that patient is unable to eat large quantities at a time so he often saves items in his room, but that he eats very well overall.   Patient had bilateral pleur-x caths placed yesterday. He also underwent thoracentesis on 1/31, 2/5 and 2/6 with a total of 4700 ml. Labs and medications reviewed.   No nutrition interventions warranted at this time. If nutrition issues arise, please consult RD.     Jarome Matin, MS, RD, LDN, CNSC Inpatient Clinical Dietitian RD pager # available in [place]  After hours/weekend pager # available in [facility]

## 2019-11-10 NOTE — Progress Notes (Signed)
NAME:  Isaac Pierce, MRN:  [id number], DOB:  04/27/1961, LOS: 12 ADMISSION DATE:  10/29/2019, CONSULTATION DATE:  10/29/2019  REFERRING MD:  Dr Francia Greaves of [facility], CHIEF COMPLAINT:  Acute resp failure following Mab infussion   Brief History   59 yo male with hx of CLL/small B cell lymphoma failed CHOP, and started obinutuzumab with cycle 1 on January 22.  Received PRBC transfusion January 28.  On January 29 was started on cycle 2 of CD20 antibody, and then developed stridor with diaphoresis and respiratory distress.  Treated with steroids and epinephrine in ER, and required Bipap.  Had thoracentesis with 2 L on 10/31/19 Repeat right-sided thoracentesis 11/05/2019  Past Medical History  GERD, Anxiety, Back pain   Significant Hospital Events   1/29 admit 1/30 transfuse 2 units PRBC  Consults:  Oncology  Procedures:  Thoracentesis left 1/31 2 L of serous fluid Thoracentesis 2/5 right 1.3 L of clear serous fluid Thoracentesis 2/6 right 1.4 L of cloudy amber-colored fluid  Significant Diagnostic Tests:  CT chest 1/29 >> decreased size of thoracic LN, increased size of pericardial soft tissue masses and multiple new lung nodules, large b/l effusions, splenomegaly  Chest x-ray 2/4 shows bilateral pleural effusion   Micro Data:  SARS CoV2 PCR 1/29 >> negative Influenza PCR 1/29 >> A and B negative Blood 1/29 >> Negative  RVP 1/29 >> negative Urine 1/29 >> negative Pneumococcal Ag 1/29 >> negative Legionella Ag 1/29 >> negative Lt pleural fluid 1/31>>no organism Lt pleural fluid 2/6 >>   Antimicrobials:  Vancomycin 1/29 x1 Cefepime 1/29>>2/2  Interim history/subjective:  Reports some associated pain at Pleurx catheter insertion sites.  RN reports no acute issues overnight.  Objective   Blood pressure (!) 117/58, pulse 95, temperature 97.6 F (36.4 C), temperature source Oral, resp. rate (!) 27, height 5\' 11"  (1.803 m), weight 60.5 kg, SpO2 100 %.        Intake/Output Summary  (Last 24 hours) at 11/10/2019 1013 Last data filed at 11/10/2019 0800 Gross per 24 hour  Intake 400.05 ml  Output 5850 ml  Net -5449.95 ml   Filed Weights   11/08/19 0400 11/09/19 0342 11/10/19 0500  Weight: 67.1 kg 67.1 kg 60.5 kg    Examination: General: Chronically ill appearing thin middle-aged male lying in bed with reported discomfort HEENT: Normocephalic/atraumatic, MM pink/moist, PERRL,  Neuro: Alert and oriented, nonfocal CV: s1s2 regular rate and rhythm, no murmur, rubs, or gallops,  PULM: Resolved rhonchi bilaterally today, diminished in bases bilateral Pleurx catheters GI: soft, bowel sounds active in all 4 quadrants, non-tender, non-distended Extremities: warm/dry, 2+ lower extremity edema edema  Skin: no rashes or lesions  Resolved Hospital Problem list     Assessment & Plan:  Acute hypoxic respiratory failure from allergic reaction to obinutuzumab  Bilateral pleural effusion s/p bilateral thoracentesis x3 -During admission thus far patient has required 3 thoracentesis on the left and 1 on the right with large volume removal. Pathology review of pleural fluid from 1/31 consistent with lymphoma  P: Continue with Lasix and Aldactone Bilateral Pleurx catheters placed 2/8, 1L removed from left1.3 removed from right  Assistance with resources underway for discharge plan Encourage pulmonary hygiene  Follow intermitten CXR Continue Bronchodilators and prednisone   CLL/B-cell lymphoma Anemia  -Dermatology and oncology following P: Hgb stable  Oncology following  Left lower extremity DVT -Seen on doppler 2/8 P: Transitioned to PO eliquis 2/9  Sinus tachycardia P: Continuous telemetry  Cardizem   Anxiety P: Xanax  Best practice:  Diet: regular diet DVT prophylaxis: scd GI prophylaxis: ppi Mobility: bed rest Code status: full code Disposition: Once DME supply plan has been made and patients family educated on Pleurex care patient will Likley be ready  for discharge. Tentative plane for discharge 2/12  Labs:   CMP Latest Ref Rng & Units 11/10/2019 11/08/2019 11/04/2019  Glucose 70 - 99 mg/dL 98 204(H) 126(H)  BUN 6 - 20 mg/dL 25(H) 26(H) 25(H)  Creatinine 0.61 - 1.24 mg/dL 0.56(L) 0.58(L) 0.67  Sodium 135 - 145 mmol/L 130(L) 134(L) 134(L)  Potassium 3.5 - 5.1 mmol/L 4.3 4.1 4.3  Chloride 98 - 111 mmol/L 90(L) 89(L) 96(L)  CO2 22 - 32 mmol/L 32 30 25  Calcium 8.9 - 10.3 mg/dL 8.9 9.1 9.2  Total Protein 6.5 - 8.1 g/dL 4.4(L) 4.5(L) -  Total Bilirubin 0.3 - 1.2 mg/dL 0.7 0.6 -  Alkaline Phos 38 - 126 U/L 165(H) 171(H) -  AST 15 - 41 U/L 20 20 -  ALT 0 - 44 U/L 28 33 -    CBC Latest Ref Rng & Units 11/10/2019 11/09/2019 11/08/2019  WBC 4.0 - 10.5 K/uL 3.4(L) 3.0(L) 2.8(L)  Hemoglobin 13.0 - 17.0 g/dL 9.1(L) 9.0(L) 9.7(L)  Hematocrit 39.0 - 52.0 % 28.1(L) 27.8(L) 30.3(L)  Platelets 150 - 400 K/uL 86(L) 77(L) 80(L)   Scheduled Meds: . acalabrutinib  100 mg Oral BID  . allopurinol  300 mg Oral Daily  . apixaban  10 mg Oral BID   Followed by  . [START ON 11/16/2019] apixaban  5 mg Oral BID  . chlorhexidine  15 mL Mouth Rinse BID  . Chlorhexidine Gluconate Cloth  6 each Topical Daily  . diltiazem  60 mg Oral Q8H  . famotidine  20 mg Oral BID  . folic acid  1 mg Oral Daily  . furosemide  40 mg Intravenous Q12H  . guaiFENesin  600 mg Oral BID  . insulin aspart  0-15 Units Subcutaneous TID WC  . insulin aspart  0-5 Units Subcutaneous QHS  . ipratropium  0.5 mg Nebulization TID  . levalbuterol  1.25 mg Nebulization TID  . mouth rinse  15 mL Mouth Rinse q12n4p  . nicotine  14 mg Transdermal Daily  . predniSONE  20 mg Oral BID WC  . sodium chloride flush  3 mL Intravenous Q12H  . spironolactone  50 mg Oral BID   Continuous Infusions: . sodium chloride Stopped (10/31/19 0746)   PRN Meds:.sodium chloride, acetaminophen, alum & mag hydroxide-simeth, HYDROcodone-acetaminophen, hydrOXYzine, ipratropium-albuterol, menthol-cetylpyridinium,  phenol, phenylephrine-shark liver oil-mineral oil-petrolatum, senna, sodium chloride flush, traMADol  Scheduled Meds: . acalabrutinib  100 mg Oral BID  . allopurinol  300 mg Oral Daily  . apixaban  10 mg Oral BID   Followed by  . [START ON 11/16/2019] apixaban  5 mg Oral BID  . chlorhexidine  15 mL Mouth Rinse BID  . Chlorhexidine Gluconate Cloth  6 each Topical Daily  . diltiazem  60 mg Oral Q8H  . famotidine  20 mg Oral BID  . folic acid  1 mg Oral Daily  . furosemide  40 mg Intravenous Q12H  . guaiFENesin  600 mg Oral BID  . insulin aspart  0-15 Units Subcutaneous TID WC  . insulin aspart  0-5 Units Subcutaneous QHS  . ipratropium  0.5 mg Nebulization TID  . levalbuterol  1.25 mg Nebulization TID  . mouth rinse  15 mL Mouth Rinse q12n4p  . nicotine  14 mg Transdermal Daily  .  predniSONE  20 mg Oral BID WC  . sodium chloride flush  3 mL Intravenous Q12H  . spironolactone  50 mg Oral BID   Continuous Infusions: . sodium chloride Stopped (10/31/19 0746)   PRN Meds:.sodium chloride, acetaminophen, alum & mag hydroxide-simeth, HYDROcodone-acetaminophen, hydrOXYzine, ipratropium-albuterol, menthol-cetylpyridinium, phenol, phenylephrine-shark liver oil-mineral oil-petrolatum, senna, sodium chloride flush, traMADol   ABG    Component Value Date/Time   PHART 7.404 10/29/2019 2140   PCO2ART 42.5 10/29/2019 2140   PO2ART 59.5 (L) 10/29/2019 2140   HCO3 26.0 10/29/2019 2140   ACIDBASEDEF 1.4 10/29/2019 1404   O2SAT 87.4 10/29/2019 2140    CBG (last 3)  Recent Labs    11/09/19 1158 11/09/19 1644 11/09/19 2137  GLUCAP 142* 69 199*   Johnsie Cancel, NP-C [facility] Contact / Pager information can be found on [place]  11/10/2019, 10:38 AM

## 2019-11-10 NOTE — Telephone Encounter (Signed)
Isaac Pierce Education officer, museum states needs to discuss patient's pleurx supplies.  Isaac Pierce phone number is [phone].

## 2019-11-11 ENCOUNTER — Inpatient Hospital Stay: Payer: Medicaid Other | Attending: Internal Medicine

## 2019-11-11 ENCOUNTER — Inpatient Hospital Stay: Payer: Medicaid Other

## 2019-11-11 LAB — GLUCOSE, CAPILLARY
Glucose-Capillary: 93 mg/dL (ref 70–99)
Glucose-Capillary: 177 mg/dL — ABNORMAL HIGH (ref 70–99)
Glucose-Capillary: 137 mg/dL — ABNORMAL HIGH (ref 70–99)
Glucose-Capillary: 136 mg/dL — ABNORMAL HIGH (ref 70–99)
Glucose-Capillary: 222 mg/dL — ABNORMAL HIGH (ref 70–99)

## 2019-11-11 LAB — CULTURE, BODY FLUID W GRAM STAIN -BOTTLE
Culture: NO GROWTH
Special Requests: ADEQUATE

## 2019-11-11 MED ORDER — ALBUMIN HUMAN 25 % IV SOLN
25.0000 g | Freq: Four times a day (QID) | INTRAVENOUS | Status: AC
  Administered 2019-11-11 – 2019-11-12 (×3): 25 g via INTRAVENOUS
  Filled 2019-11-11: qty 50
  Filled 2019-11-11 (×2): qty 100
  Filled 2019-11-11: qty 50

## 2019-11-11 MED ORDER — PREDNISONE 20 MG PO TABS
20.0000 mg | ORAL_TABLET | Freq: Every day | ORAL | Status: DC
  Administered 2019-11-12: 08:00:00 20 mg via ORAL
  Filled 2019-11-11: qty 1

## 2019-11-11 NOTE — Progress Notes (Signed)
NAME:  Isaac Pierce, MRN:  [id number], DOB:  06/27/1961, LOS: 25 ADMISSION DATE:  10/29/2019, CONSULTATION DATE:  10/29/2019  REFERRING MD:  Dr Francia Greaves of [facility], CHIEF COMPLAINT:  Acute resp failure following Mab infussion   Brief History   59 yo male with hx of CLL/small B cell lymphoma failed CHOP, and started obinutuzumab with cycle 1 on January 22.  Received PRBC transfusion January 28.  On January 29 was started on cycle 2 of CD20 antibody, and then developed stridor with diaphoresis and respiratory distress.  Treated with steroids and epinephrine in ER, and required Bipap.  Past Medical History  GERD, Anxiety, Back pain   Significant Hospital Events   1/29 admit 1/30 transfuse 2 units PRBC  Consults:  Oncology  Procedures:  Thoracentesis left 1/31 2 L of serous fluid Thoracentesis 2/5 right 1.3 L of clear serous fluid Thoracentesis 2/6 right 1.4 L of cloudy amber-colored fluid Left/Right Pleur-X Cath Placement 2/9   Significant Diagnostic Tests:  CT chest 1/29 > decreased size of thoracic LN, increased size of pericardial soft tissue masses and multiple new lung nodules, large b/l effusions, splenomegaly CXR 2/4 > shows bilateral pleural effusion   Micro Data:  SARS CoV2 PCR 1/29 > negative Influenza PCR 1/29 > A and B negative Blood 1/29 > Negative  RVP 1/29 > negative Urine 1/29 > negative Pneumococcal Ag 1/29 > negative Legionella Ag 1/29 > negative Lt pleural fluid 1/31>no organism Lt pleural fluid 2/6 >> Abundant WBC, Turbid, LD 1646, Cytology Findings consistent with lymphoma  Antimicrobials:  Vancomycin 1/29 x1 Cefepime 1/29 > 2/2  Interim history/subjective:  No events overnight. Borderline hypotension.   Objective   Blood pressure (!) 90/47, pulse 94, temperature 97.6 F (36.4 C), temperature source Axillary, resp. rate 19, height 5\' 11"  (1.803 m), weight 63.2 kg, SpO2 100 %.        Intake/Output Summary (Last 24 hours) at 11/11/2019 1132 Last data  filed at 11/11/2019 0518 Gross per 24 hour  Intake 243 ml  Output 2850 ml  Net -2607 ml   Filed Weights   11/09/19 0342 11/10/19 0500 11/11/19 0500  Weight: 67.1 kg 60.5 kg 63.2 kg    Examination: General: Chronically ill appearing thin middle-aged male sitting in bed, denies pain  HEENT: Normocephalic/atraumatic, MM pink/moist, PERRL,  Neuro: Alert and oriented, nonfocal CV: Tachy, no MRG   PULM: Clear breath sounds, bilateral pleurX catheters in place  GI: distended, active bowel sounds, non-tender  Extremities: +2 bilateral pedal edema  Skin: no rashes or lesions  Resolved Hospital Problem list     Assessment & Plan:   Acute hypoxic respiratory failure from allergic reaction to obinutuzumab  Bilateral pleural effusion s/p bilateral thoracentesis x3, s/p Bilateral PleurX Cath 2/9 -During admission thus far patient has required 3 thoracentesis on the left and 1 on the right with large volume removal. Pathology review of pleural fluid from 1/31 consistent with lymphoma  P: Continue with Lasix and Aldactone Assistance with resources underway for discharge plan >> Contacting TOC RN for education  Encourage pulmonary hygiene  Follow intermitten CXR Continue Bronchodilators and prednisone >> started 20 mg BID on 2/2, will slowly taper to 20 mg Daily for 7 days, then 10 mg Daily for 7 days    Noted Hypotension, Increased urine output and output from Pleurx >> Give Scheduled Albumin every 6 hours for 3 doses   CLL/B-cell lymphoma Anemia  -Dermatology and oncology following P: Trend CBC > Remains Stable, no signs  of bleeding  Oncology following  Left lower extremity DVT -Seen on doppler 2/8 P: Continue eliquis   Sinus tachycardia P: Continuous telemetry  Continue Cardizem   Anxiety P: Continue Xanax  Best practice:  Diet: regular diet DVT prophylaxis: scd GI prophylaxis: ppi Mobility: bed rest Code status: full code Disposition: Once DME supply plan has been  made and patients family educated on Pleurex care patient will Likley be ready for discharge. Tentative plane for discharge 2/12  Labs:   CMP Latest Ref Rng & Units 11/10/2019 11/08/2019 11/04/2019  Glucose 70 - 99 mg/dL 98 204(H) 126(H)  BUN 6 - 20 mg/dL 25(H) 26(H) 25(H)  Creatinine 0.61 - 1.24 mg/dL 0.56(L) 0.58(L) 0.67  Sodium 135 - 145 mmol/L 130(L) 134(L) 134(L)  Potassium 3.5 - 5.1 mmol/L 4.3 4.1 4.3  Chloride 98 - 111 mmol/L 90(L) 89(L) 96(L)  CO2 22 - 32 mmol/L 32 30 25  Calcium 8.9 - 10.3 mg/dL 8.9 9.1 9.2  Total Protein 6.5 - 8.1 g/dL 4.4(L) 4.5(L) -  Total Bilirubin 0.3 - 1.2 mg/dL 0.7 0.6 -  Alkaline Phos 38 - 126 U/L 165(H) 171(H) -  AST 15 - 41 U/L 20 20 -  ALT 0 - 44 U/L 28 33 -    CBC Latest Ref Rng & Units 11/10/2019 11/09/2019 11/08/2019  WBC 4.0 - 10.5 K/uL 3.4(L) 3.0(L) 2.8(L)  Hemoglobin 13.0 - 17.0 g/dL 9.1(L) 9.0(L) 9.7(L)  Hematocrit 39.0 - 52.0 % 28.1(L) 27.8(L) 30.3(L)  Platelets 150 - 400 K/uL 86(L) 77(L) 80(L)   Scheduled Meds: . acalabrutinib  100 mg Oral BID  . allopurinol  300 mg Oral Daily  . apixaban  10 mg Oral BID   Followed by  . [START ON 11/16/2019] apixaban  5 mg Oral BID  . chlorhexidine  15 mL Mouth Rinse BID  . Chlorhexidine Gluconate Cloth  6 each Topical Daily  . diltiazem  60 mg Oral Q8H  . famotidine  20 mg Oral BID  . folic acid  1 mg Oral Daily  . furosemide  40 mg Intravenous Q12H  . guaiFENesin  600 mg Oral BID  . insulin aspart  0-15 Units Subcutaneous TID WC  . insulin aspart  0-5 Units Subcutaneous QHS  . ipratropium  0.5 mg Nebulization TID  . ketorolac  15 mg Intravenous Q6H  . levalbuterol  1.25 mg Nebulization TID  . mouth rinse  15 mL Mouth Rinse q12n4p  . nicotine  14 mg Transdermal Daily  . predniSONE  20 mg Oral BID WC  . sodium chloride flush  3 mL Intravenous Q12H  . spironolactone  50 mg Oral BID   Continuous Infusions: . sodium chloride Stopped (10/31/19 0746)  . albumin human     PRN Meds:.sodium chloride,  acetaminophen, alum & mag hydroxide-simeth, HYDROcodone-acetaminophen, hydrOXYzine, ipratropium-albuterol, menthol-cetylpyridinium, phenol, phenylephrine-shark liver oil-mineral oil-petrolatum, senna, sodium chloride flush, traMADol  Scheduled Meds: . acalabrutinib  100 mg Oral BID  . allopurinol  300 mg Oral Daily  . apixaban  10 mg Oral BID   Followed by  . [START ON 11/16/2019] apixaban  5 mg Oral BID  . chlorhexidine  15 mL Mouth Rinse BID  . Chlorhexidine Gluconate Cloth  6 each Topical Daily  . diltiazem  60 mg Oral Q8H  . famotidine  20 mg Oral BID  . folic acid  1 mg Oral Daily  . furosemide  40 mg Intravenous Q12H  . guaiFENesin  600 mg Oral BID  . insulin aspart  0-15  Units Subcutaneous TID WC  . insulin aspart  0-5 Units Subcutaneous QHS  . ipratropium  0.5 mg Nebulization TID  . ketorolac  15 mg Intravenous Q6H  . levalbuterol  1.25 mg Nebulization TID  . mouth rinse  15 mL Mouth Rinse q12n4p  . nicotine  14 mg Transdermal Daily  . predniSONE  20 mg Oral BID WC  . sodium chloride flush  3 mL Intravenous Q12H  . spironolactone  50 mg Oral BID   Continuous Infusions: . sodium chloride Stopped (10/31/19 0746)  . albumin human     PRN Meds:.sodium chloride, acetaminophen, alum & mag hydroxide-simeth, HYDROcodone-acetaminophen, hydrOXYzine, ipratropium-albuterol, menthol-cetylpyridinium, phenol, phenylephrine-shark liver oil-mineral oil-petrolatum, senna, sodium chloride flush, traMADol   ABG    Component Value Date/Time   PHART 7.404 10/29/2019 2140   PCO2ART 42.5 10/29/2019 2140   PO2ART 59.5 (L) 10/29/2019 2140   HCO3 26.0 10/29/2019 2140   ACIDBASEDEF 1.4 10/29/2019 1404   O2SAT 87.4 10/29/2019 2140    CBG (last 3)  Recent Labs    11/10/19 1642 11/10/19 2106 11/11/19 [place]   Hayden Pedro, AGACNP-BC [facility]  Pgr: [phone]  PCCM Pgr: [phone]

## 2019-11-11 NOTE — Progress Notes (Signed)
Clement Sayres 1234, both the left and right pleurX caths. were drained using the kits for doing so. Ruby RN and myself explained to the patient the procedure to drain the caths. And also gave him the written instructions. He has supplies for home in the room. He says his sister will be able to assist him at home although she was unable to come today.

## 2019-11-12 ENCOUNTER — Other Ambulatory Visit: Payer: Self-pay | Admitting: Hematology & Oncology

## 2019-11-12 DIAGNOSIS — C8308 Small cell B-cell lymphoma, lymph nodes of multiple sites: Secondary | ICD-10-CM

## 2019-11-12 DIAGNOSIS — R0602 Shortness of breath: Secondary | ICD-10-CM

## 2019-11-12 DIAGNOSIS — R1012 Left upper quadrant pain: Secondary | ICD-10-CM

## 2019-11-12 LAB — GLUCOSE, CAPILLARY
Glucose-Capillary: 139 mg/dL — ABNORMAL HIGH (ref 70–99)
Glucose-Capillary: 136 mg/dL — ABNORMAL HIGH (ref 70–99)

## 2019-11-12 MED ORDER — PREDNISONE 20 MG PO TABS: ORAL_TABLET | ORAL | 0 refills | Status: AC

## 2019-11-12 MED ORDER — FUROSEMIDE 20 MG PO TABS: 40.0000 mg | ORAL_TABLET | Freq: Every day | ORAL | 0 refills | Status: AC

## 2019-11-12 MED ORDER — APIXABAN 5 MG PO TABS: 10.0000 mg | ORAL_TABLET | Freq: Two times a day (BID) | ORAL | 0 refills | Status: AC

## 2019-11-12 MED ORDER — APIXABAN 5 MG PO TABS: 5.0000 mg | ORAL_TABLET | Freq: Two times a day (BID) | ORAL | 0 refills | Status: AC

## 2019-11-12 MED ORDER — HYDROCODONE-ACETAMINOPHEN 5-325 MG PO TABS: ORAL_TABLET | ORAL | 0 refills | Status: AC

## 2019-11-12 MED ORDER — SPIRONOLACTONE 50 MG PO TABS: 50.0000 mg | ORAL_TABLET | Freq: Two times a day (BID) | ORAL | 3 refills | Status: AC

## 2019-11-12 MED ORDER — HEPARIN SOD (PORK) LOCK FLUSH 100 UNIT/ML IV SOLN
500.0000 [IU] | INTRAVENOUS | Status: AC | PRN
  Administered 2019-11-12: 500 [IU]
  Filled 2019-11-12: qty 5

## 2019-11-12 MED ORDER — TRAMADOL HCL 50 MG PO TABS: 50.0000 mg | ORAL_TABLET | Freq: Four times a day (QID) | ORAL | 0 refills | Status: AC | PRN

## 2019-11-12 MED ORDER — DILTIAZEM HCL 60 MG PO TABS: 60.0000 mg | ORAL_TABLET | Freq: Three times a day (TID) | ORAL | 3 refills | Status: AC

## 2019-11-12 MED ORDER — FUROSEMIDE 40 MG PO TABS: 40.0000 mg | ORAL_TABLET | Freq: Every day | ORAL | Status: DC

## 2019-11-12 NOTE — Discharge Summary (Addendum)
Physician Discharge Summary  Patient ID: Isaac Pierce MRN: [id number] DOB/AGE: 1961/04/20 59 y.o.  Admit date: 10/29/2019 Discharge date: 11/12/2019    Discharge Diagnoses:  Acute Hypoxic Respiratory Failure Secondary to Allergic Reaction to Obinutuzamab  Bilateral Pleural Effusions s/p PleurX Cath Placement 2/9 CLL/B-Cell Lymphoma  Anemia  Left Lower Extremity DVT  Sinus Tachycardia                                                                 DISCHARGE PLAN BY DIAGNOSIS    Acute Hypoxic Respiratory Failure Secondary to Allergic Reaction to [place]  Discharge Plan: Plan  -Continue Prednisone Taper with 20 mg Tab for one week followed by 10 mg for 1 week   Bilateral Pleural Effusions s/p PleurX Cath Placement 2/9 Discharge Plan: Plan -Patient educated on drainage and care of PleurX Cath -Plans for Drainage daily or up to max 1L until only able to drain out 150 ml, then change to every other day -Follow up appointment with Pulmonary made 2/16 at 4 pm   CLL/B-Cell Lymphoma  Anemia  Discharge Plan: -Per Oncology   Left Lower Extremity DVT  Discharge Plan: Plan -Complete 7 days of 10 mg BID Eliquis followed by 5 mg BID  Sinus Tachycardia  Discharge Plan: -Continue Cardizem                   DISCHARGE SUMMARY  59 yo male with hx of CLL/small B cell lymphoma failed CHOP, and started obinutuzumab with cycle 1 on January 22.  Received PRBC transfusion January 28.  On January 29 was started on cycle 2 of CD20 antibody, and then developed stridor with diaphoresis and respiratory distress.  Treated with steroids and epinephrine in ER, and required Bipap. Hospitalization complicated by reoccurrent  pleural effusions and tachycardia. Underwent thoracentesis Left on 1/31. Right 2/5 and 2/6. On 2/9 Decision made to place bilateral Pleur-X Catheters.    SIGNIFICANT DIAGNOSTIC STUDIES CT chest 1/29 > decreased size of thoracic LN, increased size of pericardial soft tissue masses  and multiple new lung nodules, large b/l effusions, splenomegaly CXR 2/4 > shows bilateral pleural effusion   MICRO DATA  SARS CoV2 PCR 1/29 > negative Influenza PCR 1/29 > A and B negative Blood 1/29 > Negative  RVP 1/29 > negative Urine 1/29 > negative Pneumococcal Ag 1/29 > negative Legionella Ag 1/29 > negative Lt pleural fluid 1/31>no organism Lt pleural fluid 2/6 >> Abundant WBC, Turbid, LD 1646, Cytology Findings consistent with lymphoma  ANTIBIOTICS Vancomycin 1/29 x1 Cefepime 1/29 > 2/2  Discharge Exam: General: adult male, no distress Neuro: alert, oriented, follows commands  CV: Tachy, no MRG PULM: Bilateral PleurX Cath in place, clear breath sounds  GI: intact  Extremities: +1pedal edema   Vitals:   11/12/19 0500 11/12/19 0605 11/12/19 0828 11/12/19 1225  BP:  (!) 105/52    Pulse:      Resp:      Temp:   (!) 97.4 F (36.3 C) 99 F (37.2 C)  TempSrc:   Oral Oral  SpO2:  99%    Weight: 58.2 kg     Height:         Discharge Labs  BMET Recent Labs  Lab 11/08/19 1200 11/10/19 0808  NA 134* 130*  K 4.1 4.3  CL 89* 90*  CO2 30 32  GLUCOSE 204* 98  BUN 26* 25*  CREATININE 0.58* 0.56*  CALCIUM 9.1 8.9    CBC Recent Labs  Lab 11/08/19 1200 11/09/19 0410 11/10/19 0808  HGB 9.7* 9.0* 9.1*  HCT 30.3* 27.8* 28.1*  WBC 2.8* 3.0* 3.4*  PLT 80* 77* 86*    Anti-Coagulation Recent Labs  Lab 11/08/19 1959  INR 1.1    Discharge Instructions    Pleural Drainage Schedule   Complete by: As directed    Pleural drainage schedule to start 2/10. Drain daily, up to max of 1L until patient is only able to drain out 160m. If <1511mfor 3 consecutive drains then drain every other day. If <15048mor 3 consecutive drains every other day then call the practice that inserted the catheter for evaluation and possible removal. [facility] office ([phone] Interventional Radiology ([phone] Dr. OakGenevive [phone]     Follow-up Information     [facility]. Go on 11/16/2019.   Specialty: Pulmonology Why: 2/16 at 4pm for Follow up for PleurX Catheters  Contact information: [phone]        Allergies as of 11/12/2019      Reactions   Obinutuzumab Shortness Of Breath   Reaction on 08/28/2020   Rituximab Rash   Had rash, anxiety, HTN & tachycardia ~78m46mnto 1st infusion 08/27/19      Medication List    TAKE these medications   acalabrutinib 100 MG capsule Commonly known as: CALQUENCE Take 1 capsule (100 mg total) by mouth 2 (two) times daily.   albuterol 108 (90 Base) MCG/ACT inhaler Commonly known as: VENTOLIN HFA Inhale 2 puffs into the lungs every 6 (six) hours as needed for wheezing or shortness of breath.   allopurinol 300 MG tablet Commonly known as: ZYLOPRIM Take 1 tablet (300 mg total) by mouth daily.   apixaban 5 MG Tabs tablet Commonly known as: ELIQUIS Take 2 tablets (10 mg total) by mouth 2 (two) times daily for 4 days.   apixaban 5 MG Tabs tablet Commonly known as: ELIQUIS Take 1 tablet (5 mg total) by mouth 2 (two) times daily. Start taking on: November 16, 2019   diltiazem 60 MG tablet Commonly known as: CARDIZEM Take 1 tablet (60 mg total) by mouth every 8 (eight) hours.   folic acid 1 MG tablet Commonly known as: FOLVITE Take 1 tablet by mouth once daily   furosemide 20 MG tablet Commonly known as: LASIX Take 2 tablets (40 mg total) by mouth daily. What changed: how much to take   HYDROcodone-acetaminophen 5-325 MG tablet Commonly known as: Norco 1/2 to 1 tab PO q 6 hours prn pain   hydrocortisone 2.5 % rectal cream Commonly known as: ANUSOL-HC Place rectally 3 (three) times daily.   hydrOXYzine 10 MG tablet Commonly known as: ATARAX/VISTARIL Take 1 tablet (10 mg total) by mouth every 8 (eight) hours as needed.   lidocaine-prilocaine cream Commonly known as: EMLA Apply 1 application topically  as needed.   omeprazole 20 MG capsule Commonly known as: PRILOSEC Take 1 capsule (20 mg total) by mouth daily.   predniSONE 20 MG tablet Commonly known as: Deltasone Please take 1 tablet (20 mg) daily for 7 days, followed by 1/2 tab daily for 7 days What changed: additional instructions   spironolactone 50 MG tablet Commonly known as: ALDACTONE Take 1 tablet (50  mg total) by mouth 2 (two) times daily.   traMADol 50 MG tablet Commonly known as: ULTRAM Take 1 tablet (50 mg total) by mouth every 6 (six) hours as needed for moderate pain.        Discharged Condition: Isabella Ida has met maximum benefit of inpatient care and is medically stable and cleared for discharge.  Patient is pending follow up as above.     Time spent on disposition:  42 Minutes.   Signed: Hayden Pedro, AGACNP-BC [facility]  Pgr: [phone]  PCCM Pgr: [phone]

## 2019-11-12 NOTE — Telephone Encounter (Signed)
Copy of discharge summary and procedure note faxed to [facility] per Lauren's request.  Unable to fax copy of insurance card as we do not have copy of physical card - written on [facility] order form that patient does have Medicaid Out of [place].  Above faxed to [facility] @ [phone] and placed in Lauren's desk in [place] office.

## 2019-11-12 NOTE — Progress Notes (Signed)
Discharge instructions provided and discussed with patient and pt's sister. Pt demonstrates understanding of Pleur-X draining process and RN also gave handout with instructions. RN obtained patient's home meds from pharmacy and returned them to pt. Kathrin Greathouse also answered pt's question regarding Home Health and Hayden Pedro, NP also came to room at time of discharge to finalize instructions. IV RN deaccessed port, pt taken down by NT via wheelchair at 1540.

## 2019-11-16 ENCOUNTER — Inpatient Hospital Stay: Payer: Medicaid Other | Admitting: Pulmonary Disease

## 2019-11-17 ENCOUNTER — Telehealth: Payer: Self-pay

## 2019-11-17 ENCOUNTER — Telehealth: Payer: Self-pay | Admitting: Physician Assistant

## 2019-11-17 ENCOUNTER — Telehealth: Payer: Self-pay | Admitting: Internal Medicine

## 2019-11-17 NOTE — Progress Notes (Deleted)
[facility] OFFICE PROGRESS NOTE  Patient, No Pcp Per No address on file  DIAGNOSIS: ***  PRIOR THERAPY:  CURRENT THERAPY:  INTERVAL HISTORY: Isaac Pierce 59 y.o. male returns to clinic for follow-up visit.  The patient was hospitalized from1/29/21-2/12/21 for acute hypoxic respiratory failure secondary to an allergy to his second treatment with Obintuzamab.  He developed stridor and diaphoresis and respiratory distress.  He was treated with steroids and epinephrine and was placed on a BiPAP in the emergency room.  He is currently on a prednisone taper. He is currently on __ mg. He started oral acalabrutinib oral on 11/04/2019 and has been tolerating it well.   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 He had bilateral pleurx catheters placed while hospitalized.  He also developed DVTs while in the hospital. He is currently taking Eliquis.    Today, the patient is feeling __ . He denies any fever, chills, night sweats, or weight loss. He denies any chest pain, shortness of breath, cough, or hemoptysis.   MEDICAL HISTORY: Past Medical History:  Diagnosis Date  . Anxiety   . Back pain   . GERD (gastroesophageal reflux disease)     ALLERGIES:  is allergic to obinutuzumab and rituximab.  MEDICATIONS:  Current Outpatient Medications  Medication Sig Dispense Refill  . acalabrutinib (CALQUENCE) 100 MG capsule Take 1 capsule (100 mg total) by mouth 2 (two) times daily. 60 capsule 2  . albuterol (VENTOLIN HFA) 108 (90 Base) MCG/ACT inhaler Inhale 2 puffs into the lungs every 6 (six) hours as needed for wheezing or shortness of breath. 6.7 g 0  . allopurinol (ZYLOPRIM) 300 MG tablet Take 1 tablet (300 mg total) by mouth daily. 30 tablet 0  . apixaban (ELIQUIS) 5 MG TABS tablet Take 2 tablets (10 mg total) by mouth 2 (two) times daily for 4 days. 15 tablet 0  . apixaban (ELIQUIS) 5 MG TABS tablet Take 1 tablet (5 mg  total) by mouth 2 (two) times daily. 60 tablet 0  . diltiazem (CARDIZEM) 60 MG tablet Take 1 tablet (60 mg total) by mouth every 8 (eight) hours. 90 tablet 3  . folic acid (FOLVITE) 1 MG tablet Take 1 tablet by mouth once daily 30 tablet 0  . furosemide (LASIX) 20 MG tablet Take 2 tablets (40 mg total) by mouth daily. 30 tablet 0  . HYDROcodone-acetaminophen (NORCO) 5-325 MG tablet 1/2 to 1 tab PO q 6 hours prn pain 20 tablet 0  . hydrocortisone (ANUSOL-HC) 2.5 % rectal cream Place rectally 3 (three) times daily. 30 g 0  . hydrOXYzine (ATARAX/VISTARIL) 10 MG tablet Take 1 tablet (10 mg total) by mouth every 8 (eight) hours as needed. 30 tablet 1  . lidocaine-prilocaine (EMLA) cream Apply 1 application topically as needed. 30 g 2  . omeprazole (PRILOSEC) 20 MG capsule Take 1 capsule (20 mg total) by mouth daily. 30 capsule 3  . predniSONE (DELTASONE) 20 MG tablet Please take 1 tablet (20 mg) daily for 7 days, followed by 1/2 tab daily for 7 days 46 tablet 0  . spironolactone (ALDACTONE) 50 MG tablet Take 1 tablet (50 mg total) by mouth 2 (two) times daily. 90 tablet 3  . traMADol (ULTRAM) 50 MG tablet Take 1 tablet (50 mg total) by mouth every 6 (six) hours as needed for moderate pain. 20 tablet 0   No current facility-administered medications for this visit.    SURGICAL HISTORY:  Past Surgical History:  Procedure Laterality Date  . IR IMAGING GUIDED PORT INSERTION  08/30/2019    REVIEW OF SYSTEMS:   Review of Systems  Constitutional: Negative for appetite change, chills, fatigue, fever and unexpected weight change.  HENT:   Negative for mouth sores, nosebleeds, sore throat and trouble swallowing.   Eyes: Negative for eye problems and icterus.  Respiratory: Negative for cough, hemoptysis, shortness of breath and wheezing.   Cardiovascular: Negative for chest pain and leg swelling.  Gastrointestinal: Negative for abdominal pain, constipation, diarrhea, nausea and vomiting.  Genitourinary:  Negative for bladder incontinence, difficulty urinating, dysuria, frequency and hematuria.   Musculoskeletal: Negative for back pain, gait problem, neck pain and neck stiffness.  Skin: Negative for itching and rash.  Neurological: Negative for dizziness, extremity weakness, gait problem, headaches, light-headedness and seizures.  Hematological: Negative for adenopathy. Does not bruise/bleed easily.  Psychiatric/Behavioral: Negative for confusion, depression and sleep disturbance. The patient is not nervous/anxious.     PHYSICAL EXAMINATION:  There were no vitals taken for this visit.  ECOG PERFORMANCE STATUS: {CHL ONC ECOG [id number]  Physical Exam  Constitutional: Oriented to person, place, and time and well-developed, well-nourished, and in no distress. No distress.  HENT:  Head: Normocephalic and atraumatic.  Mouth/Throat: Oropharynx is clear and moist. No oropharyngeal exudate.  Eyes: Conjunctivae are normal. Right eye exhibits no discharge. Left eye exhibits no discharge. No scleral icterus.  Neck: Normal range of motion. Neck supple.  Cardiovascular: Normal rate, regular rhythm, normal heart sounds and intact distal pulses.   Pulmonary/Chest: Effort normal and breath sounds normal. No respiratory distress. No wheezes. No rales.  Abdominal: Soft. Bowel sounds are normal. Exhibits no distension and no mass. There is no tenderness.  Musculoskeletal: Normal range of motion. Exhibits no edema.  Lymphadenopathy:    No cervical adenopathy.  Neurological: Alert and oriented to person, place, and time. Exhibits normal muscle tone. Gait normal. Coordination normal.  Skin: Skin is warm and dry. No rash noted. Not diaphoretic. No erythema. No pallor.  Psychiatric: Mood, memory and judgment normal.  Vitals reviewed.  LABORATORY DATA: Lab Results  Component Value Date   WBC 3.4 (L) 11/10/2019   HGB 9.1 (L) 11/10/2019   HCT 28.1 (L) 11/10/2019   MCV 90.6 11/10/2019   PLT 86 (L)  11/10/2019      Chemistry      Component Value Date/Time   NA 130 (L) 11/10/2019 0808   K 4.3 11/10/2019 0808   CL 90 (L) 11/10/2019 0808   CO2 32 11/10/2019 0808   BUN 25 (H) 11/10/2019 0808   CREATININE 0.56 (L) 11/10/2019 0808   CREATININE 0.61 10/28/2019 1233      Component Value Date/Time   CALCIUM 8.9 11/10/2019 0808   ALKPHOS 165 (H) 11/10/2019 0808   AST 20 11/10/2019 0808   AST 44 (H) 10/28/2019 1233   ALT 28 11/10/2019 0808   ALT 57 (H) 10/28/2019 1233   BILITOT 0.7 11/10/2019 0808   BILITOT 0.6 10/28/2019 1233       RADIOGRAPHIC STUDIES:  CT CHEST WO CONTRAST  Result Date: 10/29/2019 CLINICAL DATA:  With lymphoma, abnormal radiograph EXAM: CT CHEST WITHOUT CONTRAST TECHNIQUE: Multidetector CT imaging of the chest was performed following the standard protocol without IV contrast. COMPARISON:  Radiograph 10/28/2018, CT 08/25/2019 FINDINGS: Cardiovascular: Left IJ approach Port-A-Cath is accessed with the tip positioned at the right atrium. Normal cardiac size. Redemonstration of the extensive pericardial soft tissue attenuation lesions these appear to extend more superiorly towards the cardiac  base than on comparison study including a larger crescentic amount of soft tissue measuring approximately 6.9 cm anteroposterior and 1.7 cm in thickness near the level of the pulmonary trunk (2/79). Central pulmonary arteries are normal caliber. Atherosclerotic plaque within the normal caliber aorta. Normal 3 vessel branching of the aortic arch with minimal plaque in the great vessels. Mediastinum/Nodes: Decrease in the size of bulky thoracic lymphadenopathy compatible with patient's known lymphoproliferative disorder. Index lymph nodes include: *A high right paratracheal node measuring 12 mm short axis (2/45), previously 23 mm *AP window lymph node measuring 24 mm (4/35), previously 29 mm *Right hilar node measuring 15 mm (2/85), previously 21 mm *Subcarinal node measuring 18 mm (2/69),  previously 26 mm *Right inter lobar lymph node on comparison is difficult to assess given absence of contrast media. Thyroid gland is unremarkable. No acute abnormality of the trachea or esophagus. Lungs/Pleura: Large bilateral pleural effusions are noted. Dense bandlike areas of atelectasis are noted in both lungs. Multiple nodules are seen in the right middle and lower lobe examples include a 8 mm nodule laterally (4/102) in lower lobe and a subpleural 6 mm nodule anteriorly in the right middle lobe (4/98). No visible nodules seen in the left lung though underlying atelectasis could obscure the presence of nodules or masses. Upper Abdomen: Marked splenomegaly. Some wedge-shaped hypoattenuation in the periphery of the spleen measuring 1.6 x 3.2 cm, compatible with sequela of remote splenic infarct. Indeterminate exophytic 12 mm lesion arising from the upper pole right kidney. Musculoskeletal: Multilevel degenerative changes are present in the imaged portions of the spine. Evaluation of the chest wall limited given extensive motion artifact which may limit detection of sternal and rib fractures. No gross acute osseous abnormality or suspicious osseous lesions. IMPRESSION: 1. Mixed response of patient's lymphoproliferative disease with overall decrease in the size of bulky thoracic lymphadenopathy but with increasing pericardial soft tissue masses and multiple new lung nodules. 2. Large bilateral pleural effusions with associated compressive atelectasis. 3. 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 4. Indeterminate 12 mm lesion arising from the upper pole of the right kidney. Indeterminate on noncontrast CT. Could consider follow-up with nonemergent outpatient MR or CT. 5. Evaluation of the chest wall limited given extensive motion artifact which may limit detection of sternal and rib fractures. No gross acute osseous abnormality. 6.  Aortic Atherosclerosis (ICD10-I70.0). Electronically Signed   By:  Lovena Le M.D.   On: 10/29/2019 20:36   DG CHEST PORT 1 VIEW  Result Date: 11/09/2019 CLINICAL DATA:  Pleural effusions. EXAM: PORTABLE CHEST 1 VIEW COMPARISON:  11/08/2019 FINDINGS: The right sided Port-A-Cath is stable. There are new bilateral PleurX drainage catheters. These appear to be in good position without complicating features. Slightly smaller pleural effusions. No pneumothorax. Stable cardiomediastinal silhouette. No pulmonary edema. IMPRESSION: New bilateral PleurX drainage catheters in good position without complicating features. Slight interval decrease in size of pleural effusions and overlying atelectasis. Electronically Signed   By: Marijo Sanes M.D.   On: 11/09/2019 16:37   DG CHEST PORT 1 VIEW  Result Date: 11/08/2019 CLINICAL DATA:  History of prior effusions EXAM: PORTABLE CHEST 1 VIEW COMPARISON:  11/06/2019 FINDINGS: Cardiac shadow is stable. Right chest wall port is again seen. Slight increase in right-sided pleural effusion is noted. Stable left effusion is seen. Bibasilar atelectatic changes are noted. No bony abnormality is seen. IMPRESSION: Slight increase in right-sided pleural effusion when compared with the prior exam. Electronically Signed   By: Linus Mako.D.  On: 11/08/2019 13:35   DG Chest Port 1 View  Result Date: 11/06/2019 CLINICAL DATA:  Status post right thoracentesis. EXAM: PORTABLE CHEST 1 VIEW COMPARISON:  November 05, 2019. FINDINGS: Stable cardiomediastinal silhouette. Right internal jugular Port-A-Cath is unchanged. No pneumothorax is noted. Right pleural effusion is smaller status post thoracentesis. Stable small left pleural effusion with associated atelectasis. Bony thorax is unremarkable. IMPRESSION: No pneumothorax status post right thoracentesis. Right pleural effusion is smaller. Stable small left pleural effusion with associated atelectasis. Electronically Signed   By: Marijo Conception M.D.   On: 11/06/2019 12:17   DG Chest Port 1 View  Result  Date: 11/05/2019 CLINICAL DATA:  Status post left thoracentesis EXAM: PORTABLE CHEST 1 VIEW COMPARISON:  11/04/2019 FINDINGS: Slight interval reduction in volume of a left pleural effusion, now small, with associated atelectasis or consolidation. No pneumothorax. Unchanged moderate, layering right pleural effusion and associated atelectasis or consolidation. Right chest port catheter. IMPRESSION: Slight interval reduction in volume of a left pleural effusion, now small, with associated atelectasis or consolidation. No pneumothorax. Electronically Signed   By: Eddie Candle M.D.   On: 11/05/2019 19:06   DG CHEST PORT 1 VIEW  Result Date: 11/04/2019 CLINICAL DATA:  Shortness of breath EXAM: PORTABLE CHEST 1 VIEW COMPARISON:  11/04/2019 FINDINGS: Bilateral pleural effusions are again seen. Vascular congestion is noted. Right chest wall port is again noted. The overall appearance is similar to that seen on the prior exam. IMPRESSION: Stable bilateral pleural effusions and vascular congestion similar to that seen on the prior exam. Electronically Signed   By: Inez Catalina M.D.   On: 11/04/2019 23:52   DG Chest Port 1 View  Result Date: 11/04/2019 CLINICAL DATA:  Follow-up pleural effusions. EXAM: PORTABLE CHEST 1 VIEW COMPARISON:  Multiple previous chest films. The most recent is 11/01/2019 FINDINGS: The right IJ power port is stable. Persistent moderate-sized bilateral pleural effusions with significant overlying atelectasis. Central vascular congestion and possible mild perihilar interstitial edema. IMPRESSION: Persistent moderate-sized bilateral pleural effusions with significant overlying atelectasis. Electronically Signed   By: Marijo Sanes M.D.   On: 11/04/2019 06:14   DG CHEST PORT 1 VIEW  Result Date: 11/01/2019 CLINICAL DATA:  Status post thoracentesis EXAM: PORTABLE CHEST 1 VIEW COMPARISON:  10/31/19 FINDINGS: Cardiac shadow is stable. Right chest wall port is noted. Small pleural effusions are noted  bilaterally with basilar atelectasis. No pneumothorax is seen. IMPRESSION: Small pleural effusions bilaterally with basilar atelectasis. No pneumothorax is identified. Electronically Signed   By: Inez Catalina M.D.   On: 11/01/2019 01:48   DG Chest Port 1 View  Result Date: 10/31/2019 CLINICAL DATA:  Status post left thoracentesis. EXAM: PORTABLE CHEST 1 VIEW COMPARISON:  October 31, 2019 FINDINGS: The left-sided pleural effusion is smaller after thoracentesis. No pneumothorax. Bilateral pleural effusions remain. The right Port-A-Cath is stable. No other interval changes. IMPRESSION: The left-sided pleural effusion is smaller after thoracentesis. No pneumothorax. No other changes. Electronically Signed   By: Dorise Bullion III M.D   On: 10/31/2019 10:29   DG Chest Port 1 View  Result Date: 10/31/2019 CLINICAL DATA:  Follow-up pleural effusion EXAM: PORTABLE CHEST 1 VIEW COMPARISON:  10/30/2019 FINDINGS: Power port unchanged. Bilateral effusions, larger on the left than the right. Volume loss in the lower lungs, worse on the left than the right. No new radiographic finding. IMPRESSION: No change. Large bilateral effusions left more than right with dependent volume loss left more than right. Electronically Signed   By: Elta Guadeloupe  Shogry M.D.   On: 10/31/2019 06:00   DG Chest Port 1 View  Result Date: 10/30/2019 CLINICAL DATA:  Respiratory failure. EXAM: PORTABLE CHEST 1 VIEW COMPARISON:  Chest radiograph 10/29/2019 FINDINGS: Right anterior chest wall Port-A-Cath is present with tip projecting over the superior vena cava. Monitoring leads overlie the patient. Stable enlarged cardiac and mediastinal contours. Persistent moderate to large layering bilateral pleural effusions with underlying pulmonary consolidation. No pneumothorax. IMPRESSION: Persistent moderate to large layering bilateral effusions with underlying consolidation. Electronically Signed   By: Lovey Newcomer M.D.   On: 10/30/2019 04:58   DG Chest  Port 1 View  Result Date: 10/29/2019 CLINICAL DATA:  Shortness of breath EXAM: PORTABLE CHEST 1 VIEW COMPARISON:  09/01/2019, CT 08/25/2019 FINDINGS: Right-sided central venous port tip over the cavoatrial region allowing for rotated patient. Suspected moderate layering right pleural effusion, probably increased from prior radiograph. Moderate left pleural effusion, also suspect increase in size. Enlarged cardiomediastinal silhouette with vascular congestion. Consolidation at the left lung base. Fluid in the right fissure. Hazy appearance of both thoraces likely due to layering fluid although underlying pulmonary edema could be contributing. No pneumothorax. IMPRESSION: Hazy appearance of the bilateral lungs since 09/01/2019, suspected to be secondary to moderate layering pleural effusions, probably increased in the interim. Cardiomegaly with vascular congestion; hazy lung appearance could also be due to associated edema. Atelectasis or pneumonia at the left base. Electronically Signed   By: Donavan Foil M.D.   On: 10/29/2019 15:19   VAS [place] LOWER EXTREMITY VENOUS (DVT)  Result Date: 11/09/2019  Lower Venous DVTStudy Indications: Swelling.  Limitations: Poor ultrasound/tissue interface. Comparison Study: 08/18/2019 - Negative for DVT. Performing Technologist: Oliver Hum RVT  Examination Guidelines: A complete evaluation includes B-mode imaging, spectral Doppler, color Doppler, and power Doppler as needed of all accessible portions of each vessel. Bilateral testing is considered an integral part of a complete examination. Limited examinations for reoccurring indications may be performed as noted. The reflux portion of the exam is performed with the patient in reverse Trendelenburg.  +---------+---------------+---------+-----------+----------+--------------+ RIGHT    CompressibilityPhasicitySpontaneityPropertiesThrombus Aging +---------+---------------+---------+-----------+----------+--------------+  CFV      Full           Yes      Yes                                 +---------+---------------+---------+-----------+----------+--------------+ SFJ      Full                                                        +---------+---------------+---------+-----------+----------+--------------+ FV Prox  Full                                                        +---------+---------------+---------+-----------+----------+--------------+ FV Mid   Full                                                        +---------+---------------+---------+-----------+----------+--------------+  FV DistalFull                                                        +---------+---------------+---------+-----------+----------+--------------+ PFV      Full                                                        +---------+---------------+---------+-----------+----------+--------------+ POP      Full           Yes      Yes                                 +---------+---------------+---------+-----------+----------+--------------+ PTV      Full                                                        +---------+---------------+---------+-----------+----------+--------------+ PERO     Full                                                        +---------+---------------+---------+-----------+----------+--------------+   +---------+---------------+---------+-----------+----------+--------------+ LEFT     CompressibilityPhasicitySpontaneityPropertiesThrombus Aging +---------+---------------+---------+-----------+----------+--------------+ CFV      Full           Yes      Yes                                 +---------+---------------+---------+-----------+----------+--------------+ SFJ      Full                                                        +---------+---------------+---------+-----------+----------+--------------+ FV Prox  Full                                                         +---------+---------------+---------+-----------+----------+--------------+ FV Mid   Full                                                        +---------+---------------+---------+-----------+----------+--------------+ FV DistalFull                                                        +---------+---------------+---------+-----------+----------+--------------+   PFV      Full                                                        +---------+---------------+---------+-----------+----------+--------------+ POP      None           No       No                   Acute          +---------+---------------+---------+-----------+----------+--------------+ PTV      Partial                                      Acute          +---------+---------------+---------+-----------+----------+--------------+ PERO     Partial                                      Acute          +---------+---------------+---------+-----------+----------+--------------+ Gastroc  Partial                                      Acute          +---------+---------------+---------+-----------+----------+--------------+     Summary: RIGHT: - There is no evidence of deep vein thrombosis in the lower extremity.  - No cystic structure found in the popliteal fossa. - Ultrasound characteristics of enlarged lymph nodes are noted in the groin.  LEFT: - Findings consistent with acute deep vein thrombosis involving the left popliteal vein, left posterior tibial veins, left peroneal veins, and left gastrocnemius veins. - No cystic structure found in the popliteal fossa.  *See table(s) above for measurements and observations. Electronically signed by Deitra Mayo MD on 11/09/2019 at 3:11:22 PM.    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 He also had significant pancytopenia on presentation. He was admitted to the hospital recently with sepsis that was treated.  The patient received two cycles of systemic chemotherapy with CHOP in the hospital on September 01, 2019.He was given trial of single agent Rituxan as well as Rituxan with CHOP but has significant hypersensitivity reaction with severe shortness of breath, diaphoresis and chest pain. The treatment with Rituxan was discontinued.   The patient's treatment was then switched to Acalabrutinib 100 mg p.o. BID and Obinutuzumab IV. During his second treatment with Obinutuzumab, the patient developed a significant hypersensitivity reaction which required hospitalization. Obinutuzumab was discontinued.  He is presently on single agent Acalabrutinib and is tolerating it well.   The patient was seen with Dr. Julien Nordmann today. We will arrange for repeat lab work later this week.   Regarding his treatment, Dr. Julien Nordmann recommends __  The patient will continue with his prednisone taper.   He will continue to drain his catheter via the recommendations by pulmonology.  No orders of the defined types were placed in this encounter.    Breton Berns L Annamarie Yamaguchi, PA-C 11/17/19

## 2019-11-17 NOTE — Telephone Encounter (Signed)
Called the patient and his sister and left a voicemail x2. Was calling to see if the patient was interested in either a telephone visit or virtual visit tomorrow due to the predicted bad weather conditions. If he is interested in a telephone or virtual visit, I will send a scheduling message to reschedule his blood work for Monday 2/22. Instructed them to call us back if interested in this option.

## 2019-11-17 NOTE — Telephone Encounter (Signed)
Per 2/17 sch msg. Pt's labs and PF have been cancelled. Pt is aware of the new telephone encounter with provider scheduled for 11:30

## 2019-11-17 NOTE — Telephone Encounter (Signed)
Called patient's sister [name] regarding his appointment tomorrow 11/18/19. She stated that he would not be able to come in due to the inclement weather but will be willing to have a phone visit. [name] requested the call be made around noon.  High priority schedule message sent.

## 2019-11-18 ENCOUNTER — Other Ambulatory Visit: Payer: Self-pay | Admitting: *Deleted

## 2019-11-18 ENCOUNTER — Encounter: Payer: Self-pay | Admitting: Physician Assistant

## 2019-11-18 ENCOUNTER — Inpatient Hospital Stay: Payer: Medicaid Other

## 2019-11-18 ENCOUNTER — Inpatient Hospital Stay (HOSPITAL_BASED_OUTPATIENT_CLINIC_OR_DEPARTMENT_OTHER): Payer: Medicaid Other | Admitting: Physician Assistant

## 2019-11-18 ENCOUNTER — Inpatient Hospital Stay: Payer: Medicaid Other | Admitting: Physician Assistant

## 2019-11-18 ENCOUNTER — Ambulatory Visit: Payer: Medicaid Other

## 2019-11-18 DIAGNOSIS — C8308 Small cell B-cell lymphoma, lymph nodes of multiple sites: Secondary | ICD-10-CM

## 2019-11-18 DIAGNOSIS — J90 Pleural effusion, not elsewhere classified: Secondary | ICD-10-CM | POA: Diagnosis not present

## 2019-11-18 NOTE — Progress Notes (Signed)
[facility] HEMATOLOGY-ONCOLOGY TeleHEALTH VISIT PROGRESS NOTE   I connected with Isaac Pierce on 11/18/19 at 11:30 AM EST by telephone and verified that I am speaking with the correct person using two identifiers.  I discussed the limitations, risks, security and privacy concerns of performing an evaluation and management service by telemedicine and the availability of in-person appointments. I also discussed with the patient that there may be a patient responsible charge related to this service. The patient expressed understanding and agreed to proceed.  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  PRIOR THERAPY: 1) The patient was tried on single agent Rituxan during his hospitalization in December 2020 discontinued secondary to significant hypersensitivity reaction. 2) Systemic chemotherapy with CHOP, status post2cycles. First cycle was given on September 01, 2019. 3) Obinutuzumab 1,000 mg IV status post 2 treatments. This was discontinued due to a significant hypersensitivity reaction.   CURRENT THERAPY: Single agent oral Acalabrutinib 100 mg p.o. BID. First dose expected on 11/04/19.    INTERVAL HISTORY: Dr. Julien Nordmann and I connected with Isaac Pierce 59 year old male via a telephone visit today.  The patient was recently hospitalized from 10/29/19-11/12/19 for acute hypoxic respiratory failure secondary to an allergic reaction with Obintuzumab. The patient had tolerated his first treatment well but developed stridor, diaphoresis, and respiratory distress during his second infusion which required epinephrine, steroids and to be placed on a BiPAP.  Due to his significant reaction, Obimutuzumab has been discontinued from his  treatment plan.  The patient has been discharged from the hospital and he continues to take his prescribed prednisone taper.   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 The patient had bilateral Pleurx catheters placed on 11/09/2019.  The patient reports that he continues to drain his Pleurx catheter every other day.  He states that he drains approximately 250 to 300 cc from one side and 600 cc from the other side.  The fluid is nonbloody.  Since being discharged from the hospital, the patient also notes that his bilateral lower extremity edema has essentially resolved.  However, he does note that his legs are weak due to his prolonged hospitalization and is requesting help aquiring an assistive device such as a cane or a walker to help with his stability/ambulation.  The patient is also found to have a DVT in his lower extremity during his hospitalization and was started on Eliquis.  The patient is been tolerating his Eliquis well without any adverse side effects of bleeding or bruising including epistaxis, gingival bleeding, hemoptysis, hematemesis, melena, hematochezia, or hematuria.  He began oral chemotherapy with acalabrutinib 100 mg twice daily on 11/04/2019.  He has been tolerating this well without any adverse side effects.   Today, the patient is feeling fairly well.  He denies any recent fevers, he states that his night sweats have "gone away, he is unsure about any changes in his weight but states that he has a good appetite.  He states that his breathing is "all right" and notes some mild discomfort in his chest secondary to his Pleurx catheters.  He denies any nausea, vomiting, or abdominal pain.  He states that he experiences intermittent diarrhea and constipation but his bowels are regular at this time.  He reports  that the left axillary lymphadenopathy is resolving and notes that the lymph node is  "basically flat" at this time.  The patient's visit today is to have a follow-up appointment from his recent hospitalization as well as assess how he is tolerating his new oral chemotherapy agent with acalabrutinib.   MEDICAL HISTORY: Past Medical History:  Diagnosis Date  . Anxiety   . Back pain   . GERD (gastroesophageal reflux disease)     ALLERGIES:  is allergic to obinutuzumab and rituximab.  MEDICATIONS:  Current Outpatient Medications  Medication Sig Dispense Refill  . acalabrutinib (CALQUENCE) 100 MG capsule Take 1 capsule (100 mg total) by mouth 2 (two) times daily. 60 capsule 2  . albuterol (VENTOLIN HFA) 108 (90 Base) MCG/ACT inhaler Inhale 2 puffs into the lungs every 6 (six) hours as needed for wheezing or shortness of breath. 6.7 g 0  . allopurinol (ZYLOPRIM) 300 MG tablet Take 1 tablet (300 mg total) by mouth daily. 30 tablet 0  . apixaban (ELIQUIS) 5 MG TABS tablet Take 2 tablets (10 mg total) by mouth 2 (two) times daily for 4 days. 15 tablet 0  . apixaban (ELIQUIS) 5 MG TABS tablet Take 1 tablet (5 mg total) by mouth 2 (two) times daily. 60 tablet 0  . diltiazem (CARDIZEM) 60 MG tablet Take 1 tablet (60 mg total) by mouth every 8 (eight) hours. 90 tablet 3  . folic acid (FOLVITE) 1 MG tablet Take 1 tablet by mouth once daily 30 tablet 0  . furosemide (LASIX) 20 MG tablet Take 2 tablets (40 mg total) by mouth daily. 30 tablet 0  . HYDROcodone-acetaminophen (NORCO) 5-325 MG tablet 1/2 to 1 tab PO q 6 hours prn pain 20 tablet 0  . hydrocortisone (ANUSOL-HC) 2.5 % rectal cream Place rectally 3 (three) times daily. 30 g 0  . hydrOXYzine (ATARAX/VISTARIL) 10 MG tablet Take 1 tablet (10 mg total) by mouth every 8 (eight) hours as needed. 30 tablet 1  . lidocaine-prilocaine (EMLA) cream Apply 1 application topically as needed. 30 g 2  . omeprazole (PRILOSEC) 20 MG capsule Take 1 capsule (20 mg total) by mouth daily. 30 capsule 3  . predniSONE (DELTASONE) 20 MG tablet Please  take 1 tablet (20 mg) daily for 7 days, followed by 1/2 tab daily for 7 days 46 tablet 0  . spironolactone (ALDACTONE) 50 MG tablet Take 1 tablet (50 mg total) by mouth 2 (two) times daily. 90 tablet 3  . traMADol (ULTRAM) 50 MG tablet Take 1 tablet (50 mg total) by mouth every 6 (six) hours as needed for moderate pain. 20 tablet 0   No current facility-administered medications for this visit.    SURGICAL HISTORY:  Past Surgical History:  Procedure Laterality Date  . IR IMAGING GUIDED PORT INSERTION  08/30/2019    REVIEW OF SYSTEMS:   Review of Systems  Constitutional: Negative for appetite change, chills, fatigue, fever and unexpected weight change.  HENT: Negative for mouth sores, nosebleeds, sore throat and trouble swallowing.  Eyes: Negative for eye problems and icterus.  Respiratory: Positive for improved cough and shortness of breath. Negative for  hemoptysis and wheezing.   Cardiovascular: Positive for mild chest discomfort. Negative for leg swelling.  Gastrointestinal: Positive for occasional diarrhea or constipation. Negative for abdominal pain, nausea and vomiting.  Genitourinary: Negative for bladder incontinence, difficulty urinating, dysuria, frequency and hematuria.   Musculoskeletal: Negative for back pain, gait problem, neck pain and neck stiffness.  Skin: Negative for itching and  rash.  Neurological: Positive for generalized weakness in his lower legs bilaterally. Negative for dizziness, gait problem, headaches, light-headedness and seizures.  Hematological: Improving adenopathy in the left axillary region. Does not bruise/bleed easily.  Psychiatric/Behavioral: Negative for confusion, depression and sleep disturbance. The patient is not nervous/anxious.     PHYSICAL EXAMINATION:  There were no vitals taken for this visit.  ECOG PERFORMANCE STATUS: 1   LABORATORY DATA: Lab Results  Component Value Date   WBC 3.4 (L) 11/10/2019   HGB 9.1 (L) 11/10/2019   HCT  28.1 (L) 11/10/2019   MCV 90.6 11/10/2019   PLT 86 (L) 11/10/2019      Chemistry      Component Value Date/Time   NA 130 (L) 11/10/2019 0808   K 4.3 11/10/2019 0808   CL 90 (L) 11/10/2019 0808   CO2 32 11/10/2019 0808   BUN 25 (H) 11/10/2019 0808   CREATININE 0.56 (L) 11/10/2019 0808   CREATININE 0.61 10/28/2019 1233      Component Value Date/Time   CALCIUM 8.9 11/10/2019 0808   ALKPHOS 165 (H) 11/10/2019 0808   AST 20 11/10/2019 0808   AST 44 (H) 10/28/2019 1233   ALT 28 11/10/2019 0808   ALT 57 (H) 10/28/2019 1233   BILITOT 0.7 11/10/2019 0808   BILITOT 0.6 10/28/2019 1233       RADIOGRAPHIC STUDIES:  CT CHEST WO CONTRAST  Result Date: 10/29/2019 CLINICAL DATA:  With lymphoma, abnormal radiograph EXAM: CT CHEST WITHOUT CONTRAST TECHNIQUE: Multidetector CT imaging of the chest was performed following the standard protocol without IV contrast. COMPARISON:  Radiograph 10/28/2018, CT 08/25/2019 FINDINGS: Cardiovascular: Left IJ approach Port-A-Cath is accessed with the tip positioned at the right atrium. Normal cardiac size. Redemonstration of the extensive pericardial soft tissue attenuation lesions these appear to extend more superiorly towards the cardiac base than on comparison study including a larger crescentic amount of soft tissue measuring approximately 6.9 cm anteroposterior and 1.7 cm in thickness near the level of the pulmonary trunk (2/79). Central pulmonary arteries are normal caliber. Atherosclerotic plaque within the normal caliber aorta. Normal 3 vessel branching of the aortic arch with minimal plaque in the great vessels. Mediastinum/Nodes: Decrease in the size of bulky thoracic lymphadenopathy compatible with patient's known lymphoproliferative disorder. Index lymph nodes include: *A high right paratracheal node measuring 12 mm short axis (2/45), previously 23 mm *AP window lymph node measuring 24 mm (4/35), previously 29 mm *Right hilar node measuring 15 mm (2/85),  previously 21 mm *Subcarinal node measuring 18 mm (2/69), previously 26 mm *Right inter lobar lymph node on comparison is difficult to assess given absence of contrast media. Thyroid gland is unremarkable. No acute abnormality of the trachea or esophagus. Lungs/Pleura: Large bilateral pleural effusions are noted. Dense bandlike areas of atelectasis are noted in both lungs. Multiple nodules are seen in the right middle and lower lobe examples include a 8 mm nodule laterally (4/102) in lower lobe and a subpleural 6 mm nodule anteriorly in the right middle lobe (4/98). No visible nodules seen in the left lung though underlying atelectasis could obscure the presence of nodules or masses. Upper Abdomen: Marked splenomegaly. Some wedge-shaped hypoattenuation in the periphery of the spleen measuring 1.6 x 3.2 cm, compatible with sequela of remote splenic infarct. Indeterminate exophytic 12 mm lesion arising from the upper pole right kidney. Musculoskeletal: Multilevel degenerative changes are present in the imaged portions of the spine. Evaluation of the chest wall limited given extensive motion artifact which may limit  detection of sternal and rib fractures. No gross acute osseous abnormality or suspicious osseous lesions. IMPRESSION: 1. Mixed response of patient's lymphoproliferative disease with overall decrease in the size of bulky thoracic lymphadenopathy but with increasing pericardial soft tissue masses and multiple new lung nodules. 2. Large bilateral pleural effusions with associated compressive atelectasis. 3. 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 4. Indeterminate 12 mm lesion arising from the upper pole of the right kidney. Indeterminate on noncontrast CT. Could consider follow-up with nonemergent outpatient MR or CT. 5. Evaluation of the chest wall limited given extensive motion artifact which may limit detection of sternal and rib fractures. No gross acute osseous abnormality. 6.  Aortic  Atherosclerosis (ICD10-I70.0). Electronically Signed   By: Lovena Le M.D.   On: 10/29/2019 20:36   DG CHEST PORT 1 VIEW  Result Date: 11/09/2019 CLINICAL DATA:  Pleural effusions. EXAM: PORTABLE CHEST 1 VIEW COMPARISON:  11/08/2019 FINDINGS: The right sided Port-A-Cath is stable. There are new bilateral PleurX drainage catheters. These appear to be in good position without complicating features. Slightly smaller pleural effusions. No pneumothorax. Stable cardiomediastinal silhouette. No pulmonary edema. IMPRESSION: New bilateral PleurX drainage catheters in good position without complicating features. Slight interval decrease in size of pleural effusions and overlying atelectasis. Electronically Signed   By: Marijo Sanes M.D.   On: 11/09/2019 16:37   DG CHEST PORT 1 VIEW  Result Date: 11/08/2019 CLINICAL DATA:  History of prior effusions EXAM: PORTABLE CHEST 1 VIEW COMPARISON:  11/06/2019 FINDINGS: Cardiac shadow is stable. Right chest wall port is again seen. Slight increase in right-sided pleural effusion is noted. Stable left effusion is seen. Bibasilar atelectatic changes are noted. No bony abnormality is seen. IMPRESSION: Slight increase in right-sided pleural effusion when compared with the prior exam. Electronically Signed   By: Inez Catalina M.D.   On: 11/08/2019 13:35   DG Chest Port 1 View  Result Date: 11/06/2019 CLINICAL DATA:  Status post right thoracentesis. EXAM: PORTABLE CHEST 1 VIEW COMPARISON:  November 05, 2019. FINDINGS: Stable cardiomediastinal silhouette. Right internal jugular Port-A-Cath is unchanged. No pneumothorax is noted. Right pleural effusion is smaller status post thoracentesis. Stable small left pleural effusion with associated atelectasis. Bony thorax is unremarkable. IMPRESSION: No pneumothorax status post right thoracentesis. Right pleural effusion is smaller. Stable small left pleural effusion with associated atelectasis. Electronically Signed   By: Marijo Conception  M.D.   On: 11/06/2019 12:17   DG Chest Port 1 View  Result Date: 11/05/2019 CLINICAL DATA:  Status post left thoracentesis EXAM: PORTABLE CHEST 1 VIEW COMPARISON:  11/04/2019 FINDINGS: Slight interval reduction in volume of a left pleural effusion, now small, with associated atelectasis or consolidation. No pneumothorax. Unchanged moderate, layering right pleural effusion and associated atelectasis or consolidation. Right chest port catheter. IMPRESSION: Slight interval reduction in volume of a left pleural effusion, now small, with associated atelectasis or consolidation. No pneumothorax. Electronically Signed   By: Eddie Candle M.D.   On: 11/05/2019 19:06   DG CHEST PORT 1 VIEW  Result Date: 11/04/2019 CLINICAL DATA:  Shortness of breath EXAM: PORTABLE CHEST 1 VIEW COMPARISON:  11/04/2019 FINDINGS: Bilateral pleural effusions are again seen. Vascular congestion is noted. Right chest wall port is again noted. The overall appearance is similar to that seen on the prior exam. IMPRESSION: Stable bilateral pleural effusions and vascular congestion similar to that seen on the prior exam. Electronically Signed   By: Inez Catalina M.D.   On: 11/04/2019 23:52  DG Chest Port 1 View  Result Date: 11/04/2019 CLINICAL DATA:  Follow-up pleural effusions. EXAM: PORTABLE CHEST 1 VIEW COMPARISON:  Multiple previous chest films. The most recent is 11/01/2019 FINDINGS: The right IJ power port is stable. Persistent moderate-sized bilateral pleural effusions with significant overlying atelectasis. Central vascular congestion and possible mild perihilar interstitial edema. IMPRESSION: Persistent moderate-sized bilateral pleural effusions with significant overlying atelectasis. Electronically Signed   By: Marijo Sanes M.D.   On: 11/04/2019 06:14   DG CHEST PORT 1 VIEW  Result Date: 11/01/2019 CLINICAL DATA:  Status post thoracentesis EXAM: PORTABLE CHEST 1 VIEW COMPARISON:  10/31/19 FINDINGS: Cardiac shadow is stable. Right  chest wall port is noted. Small pleural effusions are noted bilaterally with basilar atelectasis. No pneumothorax is seen. IMPRESSION: Small pleural effusions bilaterally with basilar atelectasis. No pneumothorax is identified. Electronically Signed   By: Inez Catalina M.D.   On: 11/01/2019 01:48   DG Chest Port 1 View  Result Date: 10/31/2019 CLINICAL DATA:  Status post left thoracentesis. EXAM: PORTABLE CHEST 1 VIEW COMPARISON:  October 31, 2019 FINDINGS: The left-sided pleural effusion is smaller after thoracentesis. No pneumothorax. Bilateral pleural effusions remain. The right Port-A-Cath is stable. No other interval changes. IMPRESSION: The left-sided pleural effusion is smaller after thoracentesis. No pneumothorax. No other changes. Electronically Signed   By: Dorise Bullion III M.D   On: 10/31/2019 10:29   DG Chest Port 1 View  Result Date: 10/31/2019 CLINICAL DATA:  Follow-up pleural effusion EXAM: PORTABLE CHEST 1 VIEW COMPARISON:  10/30/2019 FINDINGS: Power port unchanged. Bilateral effusions, larger on the left than the right. Volume loss in the lower lungs, worse on the left than the right. No new radiographic finding. IMPRESSION: No change. Large bilateral effusions left more than right with dependent volume loss left more than right. Electronically Signed   By: Nelson Chimes M.D.   On: 10/31/2019 06:00   DG Chest Port 1 View  Result Date: 10/30/2019 CLINICAL DATA:  Respiratory failure. EXAM: PORTABLE CHEST 1 VIEW COMPARISON:  Chest radiograph 10/29/2019 FINDINGS: Right anterior chest wall Port-A-Cath is present with tip projecting over the superior vena cava. Monitoring leads overlie the patient. Stable enlarged cardiac and mediastinal contours. Persistent moderate to large layering bilateral pleural effusions with underlying pulmonary consolidation. No pneumothorax. IMPRESSION: Persistent moderate to large layering bilateral effusions with underlying consolidation. Electronically Signed    By: Lovey Newcomer M.D.   On: 10/30/2019 04:58   DG Chest Port 1 View  Result Date: 10/29/2019 CLINICAL DATA:  Shortness of breath EXAM: PORTABLE CHEST 1 VIEW COMPARISON:  09/01/2019, CT 08/25/2019 FINDINGS: Right-sided central venous port tip over the cavoatrial region allowing for rotated patient. Suspected moderate layering right pleural effusion, probably increased from prior radiograph. Moderate left pleural effusion, also suspect increase in size. Enlarged cardiomediastinal silhouette with vascular congestion. Consolidation at the left lung base. Fluid in the right fissure. Hazy appearance of both thoraces likely due to layering fluid although underlying pulmonary edema could be contributing. No pneumothorax. IMPRESSION: Hazy appearance of the bilateral lungs since 09/01/2019, suspected to be secondary to moderate layering pleural effusions, probably increased in the interim. Cardiomegaly with vascular congestion; hazy lung appearance could also be due to associated edema. Atelectasis or pneumonia at the left base. Electronically Signed   By: Donavan Foil M.D.   On: 10/29/2019 15:19   VAS [place] LOWER EXTREMITY VENOUS (DVT)  Result Date: 11/09/2019  Lower Venous DVTStudy Indications: Swelling.  Limitations: Poor ultrasound/tissue interface. Comparison Study: 08/18/2019 -  Negative for DVT. Performing Technologist: Oliver Hum RVT  Examination Guidelines: A complete evaluation includes B-mode imaging, spectral Doppler, color Doppler, and power Doppler as needed of all accessible portions of each vessel. Bilateral testing is considered an integral part of a complete examination. Limited examinations for reoccurring indications may be performed as noted. The reflux portion of the exam is performed with the patient in reverse Trendelenburg.  +---------+---------------+---------+-----------+----------+--------------+ RIGHT    CompressibilityPhasicitySpontaneityPropertiesThrombus Aging  +---------+---------------+---------+-----------+----------+--------------+ CFV      Full           Yes      Yes                                 +---------+---------------+---------+-----------+----------+--------------+ SFJ      Full                                                        +---------+---------------+---------+-----------+----------+--------------+ FV Prox  Full                                                        +---------+---------------+---------+-----------+----------+--------------+ FV Mid   Full                                                        +---------+---------------+---------+-----------+----------+--------------+ FV DistalFull                                                        +---------+---------------+---------+-----------+----------+--------------+ PFV      Full                                                        +---------+---------------+---------+-----------+----------+--------------+ POP      Full           Yes      Yes                                 +---------+---------------+---------+-----------+----------+--------------+ PTV      Full                                                        +---------+---------------+---------+-----------+----------+--------------+ PERO     Full                                                        +---------+---------------+---------+-----------+----------+--------------+   +---------+---------------+---------+-----------+----------+--------------+  LEFT     CompressibilityPhasicitySpontaneityPropertiesThrombus Aging +---------+---------------+---------+-----------+----------+--------------+ CFV      Full           Yes      Yes                                 +---------+---------------+---------+-----------+----------+--------------+ SFJ      Full                                                         +---------+---------------+---------+-----------+----------+--------------+ FV Prox  Full                                                        +---------+---------------+---------+-----------+----------+--------------+ FV Mid   Full                                                        +---------+---------------+---------+-----------+----------+--------------+ FV DistalFull                                                        +---------+---------------+---------+-----------+----------+--------------+ PFV      Full                                                        +---------+---------------+---------+-----------+----------+--------------+ POP      None           No       No                   Acute          +---------+---------------+---------+-----------+----------+--------------+ PTV      Partial                                      Acute          +---------+---------------+---------+-----------+----------+--------------+ PERO     Partial                                      Acute          +---------+---------------+---------+-----------+----------+--------------+ Gastroc  Partial                                      Acute          +---------+---------------+---------+-----------+----------+--------------+     Summary: RIGHT: - There is no evidence of deep vein thrombosis  in the lower extremity.  - No cystic structure found in the popliteal fossa. - Ultrasound characteristics of enlarged lymph nodes are noted in the groin.  LEFT: - Findings consistent with acute deep vein thrombosis involving the left popliteal vein, left posterior tibial veins, left peroneal veins, and left gastrocnemius veins. - No cystic structure found in the popliteal fossa.  *See table(s) above for measurements and observations. Electronically signed by Deitra Mayo MD on 11/09/2019 at 3:11:22 PM.    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 He also had significant pancytopenia on presentation. He was admitted to the hospital with sepsis that was treated.  The patient received two cycles of systemic chemotherapy with CHOP in the hospital on September 01, 2019.He was given trial of single agent Rituxan as well as Rituxan with CHOP but has significant hypersensitivity reaction with severe shortness of breath, diaphoresis and chest pain. The treatment with Rituxan was discontinued.   His treatment was discontinued with CHOP due to the patient not responding well to treatment.  His treatment was then changed to IV obinutuzumab as well as p.o. acalabrutinib 100 mg BID.  The patient had a significant hypersensitivity reaction to obinutuzumab during his second treatment and this was permanently discontinued  Dr. Julien Nordmann recommends that the patient continue on single agent acalabrutinib 100 mg twice daily.  We will arrange for the patient to have repeat blood work performed on 11/22/19 to assess for any adverse side effects of treatment.   We will see the patient back for follow-up visit in person in 2 weeks for evaluation and repeat blood work.   The patient will continue to drain his Pleurx catheter as instructed by pulmonology.  The patient knows that important to take his medications as prescribed.  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  Additionally, he knows that it is important to not skip doses of his oral chemotherapy with acalabrutinib.  I encouraged the patient to set an alarm on his phone to remind him to take both of his daily doses.   The patient is requesting assistance in acquiring a walker to help with ambulation due to his leg weakness from his prolonged hospitalization.   We will put in a DME order for a walker for the patient.  The patient will continue taking his prednisone taper as prescribed.  The patient was advised to call immediately if he has any concerning symptoms in the interval. The patient voices understanding of current disease status and treatment options and is in agreement with the current care plan. All questions were answered. The patient knows to call the clinic with any problems, questions or concerns. We can certainly see the patient much sooner if necessary  I discussed the assessment and treatment plan with the patient. The patient was provided an opportunity to ask questions and all were answered. The patient agreed with the plan and demonstrated an understanding of the instructions.  The patient was advised to call back or seek an in-person evaluation if the symptoms worsen or if the condition fails to improve as anticipated.  I provided 16 minutes of non face-to-face telephone visit time during this encounter, and > 50% was spent counseling as documented under my assessment & plan.  Charlynn Salih  L Gladies Sofranko, PA-C 11/18/2019 12:30 PM  No orders of the defined types were placed in this encounter.    Tirsa Gail L Kaysia Willard, PA-C 11/18/19  ADDENDUM: Hematology/Oncology Attending: I had a nonface-to-face encounter with the patient today I recommended his care plan.  This is a very pleasant 59 years old white male recently diagnosed with small lymphocytic lymphoma/CLL presented with bulky disease in the neck, chest, abdomen pelvis as well as splenomegaly and bilateral pleural effusion.  The patient has bilateral Pleurx catheter placed by pulmonary medicine and they are following him closely for the drainage.  He was started on several treatment regimen including first 2 cycles with CHOP with no improvement of his disease.  He has significant hypersensitivity reaction to Rituxan twice.  The patient was also tried on obinutuzumab and  tolerated the first dose well but he also developed hypersensitivity reaction with respiratory distress with the second cycle. He is currently on treatment with acalabrutinib and has been tolerating this treatment well so far.  He denied having any nausea or vomiting he denied having any fever or chills.  He has no chest pain or shortness of breath. I recommended for the patient to continue his current treatment with acalabrutinib with the same dose. For the bilateral pleural effusion he will continue with drainage on as-needed basis. We will see the patient back for follow-up visit in 2 weeks for evaluation and repeat blood work.  He will also have repeat blood work early next week. He was advised to call immediately if he has any concerning symptoms in the interval.  Disclaimer: This note was dictated with voice recognition software. Similar sounding words can inadvertently be transcribed and may be missed upon review. Eilleen Kempf, MD 11/18/19

## 2019-11-21 ENCOUNTER — Other Ambulatory Visit: Payer: Self-pay

## 2019-11-21 ENCOUNTER — Emergency Department (HOSPITAL_COMMUNITY): Payer: Medicaid Other

## 2019-11-21 ENCOUNTER — Inpatient Hospital Stay (HOSPITAL_COMMUNITY)
Admission: EM | Admit: 2019-11-21 | Discharge: 2019-11-29 | DRG: 871 | Disposition: E | Payer: Medicaid Other | Attending: Internal Medicine | Admitting: Internal Medicine

## 2019-11-21 DIAGNOSIS — D61818 Other pancytopenia: Secondary | ICD-10-CM

## 2019-11-21 DIAGNOSIS — Z79899 Other long term (current) drug therapy: Secondary | ICD-10-CM | POA: Diagnosis not present

## 2019-11-21 DIAGNOSIS — D696 Thrombocytopenia, unspecified: Secondary | ICD-10-CM | POA: Diagnosis present

## 2019-11-21 DIAGNOSIS — R109 Unspecified abdominal pain: Secondary | ICD-10-CM | POA: Diagnosis not present

## 2019-11-21 DIAGNOSIS — C9110 Chronic lymphocytic leukemia of B-cell type not having achieved remission: Secondary | ICD-10-CM | POA: Diagnosis present

## 2019-11-21 DIAGNOSIS — T451X5A Adverse effect of antineoplastic and immunosuppressive drugs, initial encounter: Secondary | ICD-10-CM | POA: Diagnosis present

## 2019-11-21 DIAGNOSIS — F419 Anxiety disorder, unspecified: Secondary | ICD-10-CM

## 2019-11-21 DIAGNOSIS — D701 Agranulocytosis secondary to cancer chemotherapy: Secondary | ICD-10-CM | POA: Diagnosis present

## 2019-11-21 DIAGNOSIS — M549 Dorsalgia, unspecified: Secondary | ICD-10-CM

## 2019-11-21 DIAGNOSIS — F17210 Nicotine dependence, cigarettes, uncomplicated: Secondary | ICD-10-CM | POA: Diagnosis present

## 2019-11-21 DIAGNOSIS — C8590 Non-Hodgkin lymphoma, unspecified, unspecified site: Secondary | ICD-10-CM | POA: Diagnosis present

## 2019-11-21 DIAGNOSIS — E871 Hypo-osmolality and hyponatremia: Secondary | ICD-10-CM | POA: Diagnosis present

## 2019-11-21 DIAGNOSIS — Z86718 Personal history of other venous thrombosis and embolism: Secondary | ICD-10-CM | POA: Diagnosis not present

## 2019-11-21 DIAGNOSIS — E43 Unspecified severe protein-calorie malnutrition: Secondary | ICD-10-CM | POA: Diagnosis present

## 2019-11-21 DIAGNOSIS — R630 Anorexia: Secondary | ICD-10-CM

## 2019-11-21 DIAGNOSIS — K219 Gastro-esophageal reflux disease without esophagitis: Secondary | ICD-10-CM | POA: Diagnosis present

## 2019-11-21 DIAGNOSIS — E872 Acidosis: Secondary | ICD-10-CM | POA: Diagnosis present

## 2019-11-21 DIAGNOSIS — J9620 Acute and chronic respiratory failure, unspecified whether with hypoxia or hypercapnia: Secondary | ICD-10-CM

## 2019-11-21 DIAGNOSIS — D61810 Antineoplastic chemotherapy induced pancytopenia: Secondary | ICD-10-CM | POA: Diagnosis present

## 2019-11-21 DIAGNOSIS — Z807 Family history of other malignant neoplasms of lymphoid, hematopoietic and related tissues: Secondary | ICD-10-CM | POA: Diagnosis not present

## 2019-11-21 DIAGNOSIS — J189 Pneumonia, unspecified organism: Secondary | ICD-10-CM | POA: Diagnosis present

## 2019-11-21 DIAGNOSIS — D649 Anemia, unspecified: Secondary | ICD-10-CM | POA: Diagnosis present

## 2019-11-21 DIAGNOSIS — Z7901 Long term (current) use of anticoagulants: Secondary | ICD-10-CM

## 2019-11-21 DIAGNOSIS — Z66 Do not resuscitate: Secondary | ICD-10-CM | POA: Diagnosis present

## 2019-11-21 DIAGNOSIS — R579 Shock, unspecified: Secondary | ICD-10-CM | POA: Diagnosis present

## 2019-11-21 DIAGNOSIS — Z9221 Personal history of antineoplastic chemotherapy: Secondary | ICD-10-CM | POA: Diagnosis not present

## 2019-11-21 DIAGNOSIS — A419 Sepsis, unspecified organism: Secondary | ICD-10-CM | POA: Diagnosis present

## 2019-11-21 DIAGNOSIS — J90 Pleural effusion, not elsewhere classified: Secondary | ICD-10-CM | POA: Diagnosis not present

## 2019-11-21 DIAGNOSIS — R6520 Severe sepsis without septic shock: Secondary | ICD-10-CM

## 2019-11-21 DIAGNOSIS — Z681 Body mass index (BMI) 19 or less, adult: Secondary | ICD-10-CM

## 2019-11-21 DIAGNOSIS — Z20822 Contact with and (suspected) exposure to covid-19: Secondary | ICD-10-CM | POA: Diagnosis present

## 2019-11-21 DIAGNOSIS — J9601 Acute respiratory failure with hypoxia: Secondary | ICD-10-CM | POA: Diagnosis not present

## 2019-11-21 DIAGNOSIS — R6521 Severe sepsis with septic shock: Secondary | ICD-10-CM | POA: Diagnosis present

## 2019-11-21 DIAGNOSIS — D630 Anemia in neoplastic disease: Secondary | ICD-10-CM | POA: Diagnosis not present

## 2019-11-21 DIAGNOSIS — J910 Malignant pleural effusion: Secondary | ICD-10-CM | POA: Diagnosis present

## 2019-11-21 LAB — CBC WITH DIFFERENTIAL/PLATELET
Abs Immature Granulocytes: 0.02 10*3/uL (ref 0.00–0.07)
Basophils Absolute: 0 10*3/uL (ref 0.0–0.1)
Basophils Relative: 0 %
Eosinophils Absolute: 0 10*3/uL (ref 0.0–0.5)
Eosinophils Relative: 0 %
HCT: 20.9 % — ABNORMAL LOW (ref 39.0–52.0)
Hemoglobin: 6.6 g/dL — CL (ref 13.0–17.0)
Immature Granulocytes: 1 %
Lymphocytes Relative: 42 %
Lymphs Abs: 1.3 10*3/uL (ref 0.7–4.0)
MCH: 28.3 pg (ref 26.0–34.0)
MCHC: 31.6 g/dL (ref 30.0–36.0)
MCV: 89.7 fL (ref 80.0–100.0)
Monocytes Absolute: 0.2 10*3/uL (ref 0.1–1.0)
Monocytes Relative: 8 %
Neutro Abs: 1.5 10*3/uL — ABNORMAL LOW (ref 1.7–7.7)
Neutrophils Relative %: 49 %
Platelets: 55 10*3/uL — ABNORMAL LOW (ref 150–400)
RBC: 2.33 MIL/uL — ABNORMAL LOW (ref 4.22–5.81)
RDW: 17.2 % — ABNORMAL HIGH (ref 11.5–15.5)
WBC: 3 10*3/uL — ABNORMAL LOW (ref 4.0–10.5)
nRBC: 0 % (ref 0.0–0.2)

## 2019-11-21 LAB — LACTIC ACID, PLASMA
Lactic Acid, Venous: 4.4 mmol/L (ref 0.5–1.9)
Lactic Acid, Venous: 3.7 mmol/L (ref 0.5–1.9)
Lactic Acid, Venous: 1.4 mmol/L (ref 0.5–1.9)

## 2019-11-21 LAB — URINALYSIS, ROUTINE W REFLEX MICROSCOPIC
Bilirubin Urine: NEGATIVE
Glucose, UA: NEGATIVE mg/dL
Hgb urine dipstick: NEGATIVE
Ketones, ur: NEGATIVE mg/dL
Leukocytes,Ua: NEGATIVE
Nitrite: NEGATIVE
Protein, ur: NEGATIVE mg/dL
Specific Gravity, Urine: 1.012 (ref 1.005–1.030)
pH: 6 (ref 5.0–8.0)

## 2019-11-21 LAB — APTT: aPTT: 38 seconds — ABNORMAL HIGH (ref 24–36)

## 2019-11-21 LAB — COMPREHENSIVE METABOLIC PANEL
ALT: 18 U/L (ref 0–44)
AST: 29 U/L (ref 15–41)
Albumin: 1.6 g/dL — ABNORMAL LOW (ref 3.5–5.0)
Alkaline Phosphatase: 384 U/L — ABNORMAL HIGH (ref 38–126)
Anion gap: 11 (ref 5–15)
BUN: 31 mg/dL — ABNORMAL HIGH (ref 6–20)
CO2: 20 mmol/L — ABNORMAL LOW (ref 22–32)
Calcium: 7.5 mg/dL — ABNORMAL LOW (ref 8.9–10.3)
Chloride: 98 mmol/L (ref 98–111)
Creatinine, Ser: 0.78 mg/dL (ref 0.61–1.24)
GFR calc Af Amer: >60 mL/min (ref >60)
GFR calc non Af Amer: >60 mL/min (ref >60)
Glucose, Bld: 111 mg/dL — ABNORMAL HIGH (ref 70–99)
Potassium: 4.5 mmol/L (ref 3.5–5.1)
Sodium: 129 mmol/L — ABNORMAL LOW (ref 135–145)
Total Bilirubin: 0.4 mg/dL (ref 0.3–1.2)
Total Protein: 4.1 g/dL — ABNORMAL LOW (ref 6.5–8.1)

## 2019-11-21 LAB — RESPIRATORY PANEL BY RT PCR (FLU A&B, COVID)
Influenza A by PCR: NEGATIVE
Influenza B by PCR: NEGATIVE
SARS Coronavirus 2 by RT PCR: NEGATIVE

## 2019-11-21 LAB — SARS CORONAVIRUS 2 (TAT 6-24 HRS): SARS Coronavirus 2: NEGATIVE

## 2019-11-21 LAB — PROTIME-INR
INR: 2.3 — ABNORMAL HIGH (ref 0.8–1.2)
Prothrombin Time: 24.8 seconds — ABNORMAL HIGH (ref 11.4–15.2)

## 2019-11-21 LAB — HEPARIN LEVEL (UNFRACTIONATED): Heparin Unfractionated: >2.2 IU/mL — ABNORMAL HIGH (ref 0.30–0.70)

## 2019-11-21 LAB — PREPARE RBC (CROSSMATCH)

## 2019-11-21 LAB — POC SARS CORONAVIRUS 2 AG -  ED: SARS Coronavirus 2 Ag: NEGATIVE

## 2019-11-21 LAB — MRSA PCR SCREENING: MRSA by PCR: NEGATIVE

## 2019-11-21 MED ORDER — VANCOMYCIN HCL IN DEXTROSE 1-5 GM/200ML-% IV SOLN: 1000.0000 mg | Freq: Once | INTRAVENOUS | Status: DC

## 2019-11-21 MED ORDER — VANCOMYCIN HCL 750 MG/150ML IV SOLN
750.0000 mg | Freq: Two times a day (BID) | INTRAVENOUS | Status: DC
  Administered 2019-11-22: 750 mg via INTRAVENOUS
  Filled 2019-11-21: qty 150

## 2019-11-21 MED ORDER — SODIUM CHLORIDE 0.9 % IV SOLN
10.0000 mL/h | Freq: Once | INTRAVENOUS | Status: AC
  Administered 2019-11-21: 10 mL/h via INTRAVENOUS

## 2019-11-21 MED ORDER — LACTATED RINGERS IV BOLUS
1000.0000 mL | Freq: Once | INTRAVENOUS | Status: AC
  Administered 2019-11-21: 1000 mL via INTRAVENOUS

## 2019-11-21 MED ORDER — CHLORHEXIDINE GLUCONATE CLOTH 2 % EX PADS
6.0000 | MEDICATED_PAD | Freq: Every day | CUTANEOUS | Status: DC
  Administered 2019-11-21: 6 via TOPICAL

## 2019-11-21 MED ORDER — LACTATED RINGERS IV SOLN: INTRAVENOUS | Status: DC

## 2019-11-21 MED ORDER — LACTATED RINGERS IV BOLUS (SEPSIS)
1000.0000 mL | Freq: Once | INTRAVENOUS | Status: AC
  Administered 2019-11-21 (×2): 1000 mL via INTRAVENOUS

## 2019-11-21 MED ORDER — FAMOTIDINE 20 MG PO TABS
20.0000 mg | ORAL_TABLET | Freq: Every day | ORAL | Status: DC
  Administered 2019-11-22 – 2019-11-23 (×2): 20 mg via ORAL
  Filled 2019-11-21 (×2): qty 1

## 2019-11-21 MED ORDER — SODIUM CHLORIDE 0.9 % IV SOLN
2.0000 g | Freq: Three times a day (TID) | INTRAVENOUS | Status: DC
  Administered 2019-11-21 – 2019-11-25 (×13): 2 g via INTRAVENOUS
  Filled 2019-11-21 (×13): qty 2

## 2019-11-21 MED ORDER — HEPARIN (PORCINE) 25000 UT/250ML-% IV SOLN: 1200.0000 [IU]/h | INTRAVENOUS | Status: DC

## 2019-11-21 MED ORDER — TRAMADOL HCL 50 MG PO TABS
50.0000 mg | ORAL_TABLET | Freq: Four times a day (QID) | ORAL | Status: DC | PRN
  Administered 2019-11-21 – 2019-11-24 (×7): 50 mg via ORAL
  Filled 2019-11-21 (×7): qty 1

## 2019-11-21 MED ORDER — HYDROCODONE-ACETAMINOPHEN 5-325 MG PO TABS
1.0000 | ORAL_TABLET | Freq: Four times a day (QID) | ORAL | Status: DC | PRN
  Administered 2019-11-22 – 2019-11-25 (×11): 1 via ORAL
  Filled 2019-11-21 (×11): qty 1

## 2019-11-21 MED ORDER — LACTATED RINGERS IV BOLUS (SEPSIS)
1000.0000 mL | Freq: Once | INTRAVENOUS | Status: AC
  Administered 2019-11-21: 1000 mL via INTRAVENOUS

## 2019-11-21 MED ORDER — SODIUM CHLORIDE 0.9 % IV SOLN
2.0000 g | Freq: Once | INTRAVENOUS | Status: AC
  Administered 2019-11-21: 2 g via INTRAVENOUS
  Filled 2019-11-21: qty 2

## 2019-11-21 MED ORDER — VANCOMYCIN HCL 1250 MG/250ML IV SOLN
1250.0000 mg | INTRAVENOUS | Status: AC
  Administered 2019-11-21: 1250 mg via INTRAVENOUS
  Filled 2019-11-21: qty 250

## 2019-11-21 MED ORDER — FENTANYL CITRATE (PF) 100 MCG/2ML IJ SOLN
100.0000 ug | Freq: Once | INTRAMUSCULAR | Status: AC
  Administered 2019-11-21: 100 ug via INTRAVENOUS
  Filled 2019-11-21: qty 2

## 2019-11-21 MED ORDER — METRONIDAZOLE IN NACL 5-0.79 MG/ML-% IV SOLN
500.0000 mg | Freq: Once | INTRAVENOUS | Status: AC
  Administered 2019-11-21: 500 mg via INTRAVENOUS
  Filled 2019-11-21: qty 100

## 2019-11-21 NOTE — Consult Note (Signed)
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 male with medical history significant for CLL diagnosed in Nov 2020 and bilateral Pleurx catheters who presents with worsening shortness of breath.   On review of the previous records Isaac Pierce follows with Dr. Earlie Server at [facility]. His last visit was a telehealth visit on 11/17/2019. Isaac Pierce has had a complicated treatment course; he was initially treated with single agent Rituxan in December 2020, however this was discontinued secondary to a significant hypersensitivity reaction.  He is also received systemic therapy with CHOP and underwent 2 cycles.  Most recently he was given obinutuzumab 1000 mg IV, however this also had to be discontinued due to hypersensitivity reaction.  Most recently he has been started on acalabrutinib with his first dose administered on 11/04/2019.  Of note the patient was admitted to the hospital from 10/29/2019 until 11/12/2019 and during that hospitalization received bilateral Pleurx catheters due to acute hypoxic respiratory failure secondary to bilateral malignant pleural effusions.  On exam today Isaac Pierce appears uncomfortable.  He reports that over the last 5 days he has not been able to eat any solid food, has been unable to walk, and has had worsening shortness of breath.  Additionally he notes that he is having abdominal pain under his left ribs.  He reports that he has not had any issues with nausea, vomiting, or diarrhea in the days proceeding his admission.  He reports that he is short of breath and quite fatigued.  He notes that he has been draining from his Pleurx catheters bilaterally and the fluid has been serosanguineous.  He notes  he has been faithfully taking his acalabrutinib medication, but recently spilled his pills up on the floor and does not have the money for possible refill.  He denies having any overt signs of bleeding or bruising.  He does endorse having a cough that is nonproductive in nature.  A full 10 point ROS is listed below.  MEDICAL HISTORY:  Past Medical History:  Diagnosis Date  . Anxiety   . Back pain   . GERD (gastroesophageal reflux disease)     SURGICAL HISTORY: Past Surgical History:  Procedure Laterality Date  . IR IMAGING GUIDED PORT INSERTION  08/30/2019    SOCIAL HISTORY: Social History   Socioeconomic History  . Marital status: Single    Spouse name: Not on file  . Number of children: Not on file  . Years of education: Not on file  . Highest education level: Not on file  Occupational History  . Not on file  Tobacco Use  . Smoking status: Current Every Day Smoker    Types: Cigarettes  . Smokeless tobacco: Never Used  Substance and Sexual Activity  . Alcohol use: Not Currently  . Drug use: Not Currently  . Sexual activity: Not on file  Other Topics Concern  . Not on file  Social History Narrative  . Not on file   Social Determinants of Health   Financial Resource Strain:   . Difficulty of Paying Living Expenses: Not on file  Food Insecurity:   . Worried About Charity fundraiser in the Last Year: Not on file  . Ran Out of Food in the Last Year: Not  on file  Transportation Needs:   . Lack of Transportation (Medical): Not on file  . Lack of Transportation (Non-Medical): Not on file  Physical Activity:   . Days of Exercise per Week: Not on file  . Minutes of Exercise per Session: Not on file  Stress:   . Feeling of Stress : Not on file  Social Connections:   . Frequency of Communication with Friends and Family: Not on file  . Frequency of Social Gatherings with Friends and Family: Not on file  . Attends Religious Services: Not on file  . Active Member of  Clubs or Organizations: Not on file  . Attends Archivist Meetings: Not on file  . Marital Status: Not on file  Intimate Partner Violence:   . Fear of Current or Ex-Partner: Not on file  . Emotionally Abused: Not on file  . Physically Abused: Not on file  . Sexually Abused: Not on file    FAMILY HISTORY: Family History  Problem Relation Age of Onset  . Lymphoma Mother   . Lymphoma Father     ALLERGIES:  is allergic to obinutuzumab and rituximab.  MEDICATIONS:  Current Facility-Administered Medications  Medication Dose Route Frequency Provider Last Rate Last Admin  . 0.9 %  sodium chloride infusion  10 mL/hr Intravenous Once Davonna Belling, MD      . ceFEPIme (MAXIPIME) 2 g in sodium chloride 0.9 % 100 mL IVPB  2 g Intravenous Q8H Adrian Saran, [facility]      . famotidine (PEPCID) tablet 20 mg  20 mg Oral Daily Mannam, Praveen, MD      . Derrill Memo ON 11/22/2019] heparin ADULT infusion 100 units/mL (25000 units/227mL sodium chloride 0.45%)  1,200 Units/hr Intravenous Continuous Arlyn Dunning M, RPH      . lactated ringers bolus 1,000 mL  1,000 mL Intravenous Once Mannam, Praveen, MD      . lactated ringers infusion   Intravenous Continuous Mannam, Praveen, MD      . Derrill Memo ON 11/22/2019] vancomycin (VANCOREADY) IVPB 750 mg/150 mL  750 mg Intravenous Q12H Adrian Saran, [facility]       Current Outpatient Medications  Medication Sig Dispense Refill  . acalabrutinib (CALQUENCE) 100 MG capsule Take 1 capsule (100 mg total) by mouth 2 (two) times daily. 60 capsule 2  . albuterol (VENTOLIN HFA) 108 (90 Base) MCG/ACT inhaler Inhale 2 puffs into the lungs every 6 (six) hours as needed for wheezing or shortness of breath. 6.7 g 0  . allopurinol (ZYLOPRIM) 300 MG tablet Take 1 tablet (300 mg total) by mouth daily. 30 tablet 0  . apixaban (ELIQUIS) 5 MG TABS tablet Take 1 tablet (5 mg total) by mouth 2 (two) times daily. 60 tablet 0  . diltiazem (CARDIZEM) 60 MG tablet Take 1 tablet (60 mg  total) by mouth every 8 (eight) hours. 90 tablet 3  . folic acid (FOLVITE) 1 MG tablet Take 1 tablet by mouth once daily 30 tablet 0  . furosemide (LASIX) 20 MG tablet Take 2 tablets (40 mg total) by mouth daily. 30 tablet 0  . HYDROcodone-acetaminophen (NORCO) 5-325 MG tablet 1/2 to 1 tab PO q 6 hours prn pain 20 tablet 0  . hydrOXYzine (ATARAX/VISTARIL) 10 MG tablet Take 1 tablet (10 mg total) by mouth every 8 (eight) hours as needed. 30 tablet 1  . lidocaine-prilocaine (EMLA) cream Apply 1 application topically as needed. 30 g 2  . omeprazole (PRILOSEC) 20 MG capsule Take 1 capsule (20 mg  total) by mouth daily. 30 capsule 3  . spironolactone (ALDACTONE) 50 MG tablet Take 1 tablet (50 mg total) by mouth 2 (two) times daily. 90 tablet 3  . traMADol (ULTRAM) 50 MG tablet Take 1 tablet (50 mg total) by mouth every 6 (six) hours as needed for moderate pain. 20 tablet 0  . apixaban (ELIQUIS) 5 MG TABS tablet Take 2 tablets (10 mg total) by mouth 2 (two) times daily for 4 days. (Patient not taking: Reported on 11/18/2019) 15 tablet 0  . hydrocortisone (ANUSOL-HC) 2.5 % rectal cream Place rectally 3 (three) times daily. (Patient not taking: Reported on 11/08/2019) 30 g 0  . predniSONE (DELTASONE) 20 MG tablet Please take 1 tablet (20 mg) daily for 7 days, followed by 1/2 tab daily for 7 days (Patient not taking: Reported on 11/14/2019) 46 tablet 0    REVIEW OF SYSTEMS:   Constitutional: ( - ) fevers, ( - )  chills , ( - ) night sweats Eyes: ( - ) blurriness of vision, ( - ) double vision, ( - ) watery eyes Ears, nose, mouth, throat, and face: ( - ) mucositis, ( - ) sore throat Respiratory: ( - ) cough, ( - ) dyspnea, ( - ) wheezes Cardiovascular: ( - ) palpitation, ( - ) chest discomfort, ( - ) lower extremity swelling Gastrointestinal:  ( - ) nausea, ( - ) heartburn, ( - ) change in bowel habits Skin: ( - ) abnormal skin rashes Lymphatics: ( - ) new lymphadenopathy, ( - ) easy bruising Neurological:  ( - ) numbness, ( - ) tingling, ( - ) new weaknesses Behavioral/Psych: ( - ) mood change, ( - ) new changes  All other systems were reviewed with the patient and are negative.  PHYSICAL EXAMINATION: ECOG PERFORMANCE STATUS: 4 - Bedbound  Vitals:   11/04/2019 1630 11/15/2019 1700  BP: (!) 97/53 (!) 104/52  Pulse: (!) 135 (!) 141  Resp: (!) 29 (!) 27  Temp:    SpO2: 92% 94%   Filed Weights   11/13/2019 1348  Weight: 128 lb 4.9 oz (58.2 kg)    GENERAL: chronically ill appearing elderly Caucasian male in NAD  SKIN: skin color, texture, turgor are normal, no rashes or significant lesions EYES: conjunctiva are pink and non-injected, sclera clear LUNGS: clear to auscultation and percussion with normal breathing effort HEART: regular rate & rhythm and no murmurs and no lower extremity edema Musculoskeletal: no cyanosis of digits and no clubbing  PSYCH: alert & oriented x 3, fluent speech NEURO: no focal motor/sensory deficits  LABORATORY DATA:  I have reviewed the data as listed CBC Latest Ref Rng & Units 11/07/2019 11/10/2019 11/09/2019  WBC 4.0 - 10.5 K/uL 3.0(L) 3.4(L) 3.0(L)  Hemoglobin 13.0 - 17.0 g/dL 6.6(LL) 9.1(L) 9.0(L)  Hematocrit 39.0 - 52.0 % 20.9(L) 28.1(L) 27.8(L)  Platelets 150 - 400 K/uL 55(L) 86(L) 77(L)    CMP Latest Ref Rng & Units 11/23/2019 11/10/2019 11/08/2019  Glucose 70 - 99 mg/dL 111(H) 98 204(H)  BUN 6 - 20 mg/dL 31(H) 25(H) 26(H)  Creatinine 0.61 - 1.24 mg/dL 0.78 0.56(L) 0.58(L)  Sodium 135 - 145 mmol/L 129(L) 130(L) 134(L)  Potassium 3.5 - 5.1 mmol/L 4.5 4.3 4.1  Chloride 98 - 111 mmol/L 98 90(L) 89(L)  CO2 22 - 32 mmol/L 20(L) 32 30  Calcium 8.9 - 10.3 mg/dL 7.5(L) 8.9 9.1  Total Protein 6.5 - 8.1 g/dL 4.1(L) 4.4(L) 4.5(L)  Total Bilirubin 0.3 - 1.2 mg/dL 0.4 0.7 0.6  Alkaline Phos 38 - 126 U/L 384(H) 165(H) 171(H)  AST 15 - 41 U/L 29 20 20   ALT 0 - 44 U/L 18 28 33    RADIOGRAPHIC STUDIES:  CT CHEST WO CONTRAST  Result Date: 10/29/2019 CLINICAL DATA:   With lymphoma, abnormal radiograph EXAM: CT CHEST WITHOUT CONTRAST TECHNIQUE: Multidetector CT imaging of the chest was performed following the standard protocol without IV contrast. COMPARISON:  Radiograph 10/28/2018, CT 08/25/2019 FINDINGS: Cardiovascular: Left IJ approach Port-A-Cath is accessed with the tip positioned at the right atrium. Normal cardiac size. Redemonstration of the extensive pericardial soft tissue attenuation lesions these appear to extend more superiorly towards the cardiac base than on comparison study including a larger crescentic amount of soft tissue measuring approximately 6.9 cm anteroposterior and 1.7 cm in thickness near the level of the pulmonary trunk (2/79). Central pulmonary arteries are normal caliber. Atherosclerotic plaque within the normal caliber aorta. Normal 3 vessel branching of the aortic arch with minimal plaque in the great vessels. Mediastinum/Nodes: Decrease in the size of bulky thoracic lymphadenopathy compatible with patient's known lymphoproliferative disorder. Index lymph nodes include: *A high right paratracheal node measuring 12 mm short axis (2/45), previously 23 mm *AP window lymph node measuring 24 mm (4/35), previously 29 mm *Right hilar node measuring 15 mm (2/85), previously 21 mm *Subcarinal node measuring 18 mm (2/69), previously 26 mm *Right inter lobar lymph node on comparison is difficult to assess given absence of contrast media. Thyroid gland is unremarkable. No acute abnormality of the trachea or esophagus. Lungs/Pleura: Large bilateral pleural effusions are noted. Dense bandlike areas of atelectasis are noted in both lungs. Multiple nodules are seen in the right middle and lower lobe examples include a 8 mm nodule laterally (4/102) in lower lobe and a subpleural 6 mm nodule anteriorly in the right middle lobe (4/98). No visible nodules seen in the left lung though underlying atelectasis could obscure the presence of nodules or masses. Upper  Abdomen: Marked splenomegaly. Some wedge-shaped hypoattenuation in the periphery of the spleen measuring 1.6 x 3.2 cm, compatible with sequela of remote splenic infarct. Indeterminate exophytic 12 mm lesion arising from the upper pole right kidney. Musculoskeletal: Multilevel degenerative changes are present in the imaged portions of the spine. Evaluation of the chest wall limited given extensive motion artifact which may limit detection of sternal and rib fractures. No gross acute osseous abnormality or suspicious osseous lesions. IMPRESSION: 1. Mixed response of patient's lymphoproliferative disease with overall decrease in the size of bulky thoracic lymphadenopathy but with increasing pericardial soft tissue masses and multiple new lung nodules. 2. Large bilateral pleural effusions with associated compressive atelectasis. 3. 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 4. Indeterminate 12 mm lesion arising from the upper pole of the right kidney. Indeterminate on noncontrast CT. Could consider follow-up with nonemergent outpatient MR or CT. 5. Evaluation of the chest wall limited given extensive motion artifact which may limit detection of sternal and rib fractures. No gross acute osseous abnormality. 6.  Aortic Atherosclerosis (ICD10-I70.0). Electronically Signed   By: Lovena Le M.D.   On: 10/29/2019 20:36   DG Chest Port 1 View  Result Date: 11/18/2019 CLINICAL DATA:  Shortness of breath and fever EXAM: PORTABLE CHEST 1 VIEW COMPARISON:  Chest radiograph dated 11/09/2019 FINDINGS: A right internal jugular central venous port catheter tip overlies the cavoatrial junction. Bilateral pleural and a mediastinal drain appear unchanged in position compared to prior exam. The cardiac silhouette is unchanged. Small bilateral pleural effusions with  associated atelectasis/airspace disease are not significantly changed. There is no pneumothorax. The osseous structures are intact. IMPRESSION: Small  bilateral pleural effusions with associated atelectasis/airspace disease are not significantly changed compared to prior exam. Electronically Signed   By: Zerita Boers M.D.   On: 11/02/2019 14:49   DG CHEST PORT 1 VIEW  Result Date: 11/09/2019 CLINICAL DATA:  Pleural effusions. EXAM: PORTABLE CHEST 1 VIEW COMPARISON:  11/08/2019 FINDINGS: The right sided Port-A-Cath is stable. There are new bilateral PleurX drainage catheters. These appear to be in good position without complicating features. Slightly smaller pleural effusions. No pneumothorax. Stable cardiomediastinal silhouette. No pulmonary edema. IMPRESSION: New bilateral PleurX drainage catheters in good position without complicating features. Slight interval decrease in size of pleural effusions and overlying atelectasis. Electronically Signed   By: Marijo Sanes M.D.   On: 11/09/2019 16:37   DG CHEST PORT 1 VIEW  Result Date: 11/08/2019 CLINICAL DATA:  History of prior effusions EXAM: PORTABLE CHEST 1 VIEW COMPARISON:  11/06/2019 FINDINGS: Cardiac shadow is stable. Right chest wall port is again seen. Slight increase in right-sided pleural effusion is noted. Stable left effusion is seen. Bibasilar atelectatic changes are noted. No bony abnormality is seen. IMPRESSION: Slight increase in right-sided pleural effusion when compared with the prior exam. Electronically Signed   By: Inez Catalina M.D.   On: 11/08/2019 13:35   DG Chest Port 1 View  Result Date: 11/06/2019 CLINICAL DATA:  Status post right thoracentesis. EXAM: PORTABLE CHEST 1 VIEW COMPARISON:  November 05, 2019. FINDINGS: Stable cardiomediastinal silhouette. Right internal jugular Port-A-Cath is unchanged. No pneumothorax is noted. Right pleural effusion is smaller status post thoracentesis. Stable small left pleural effusion with associated atelectasis. Bony thorax is unremarkable. IMPRESSION: No pneumothorax status post right thoracentesis. Right pleural effusion is smaller. Stable small  left pleural effusion with associated atelectasis. Electronically Signed   By: Marijo Conception M.D.   On: 11/06/2019 12:17   DG Chest Port 1 View  Result Date: 11/05/2019 CLINICAL DATA:  Status post left thoracentesis EXAM: PORTABLE CHEST 1 VIEW COMPARISON:  11/04/2019 FINDINGS: Slight interval reduction in volume of a left pleural effusion, now small, with associated atelectasis or consolidation. No pneumothorax. Unchanged moderate, layering right pleural effusion and associated atelectasis or consolidation. Right chest port catheter. IMPRESSION: Slight interval reduction in volume of a left pleural effusion, now small, with associated atelectasis or consolidation. No pneumothorax. Electronically Signed   By: Eddie Candle M.D.   On: 11/05/2019 19:06   DG CHEST PORT 1 VIEW  Result Date: 11/04/2019 CLINICAL DATA:  Shortness of breath EXAM: PORTABLE CHEST 1 VIEW COMPARISON:  11/04/2019 FINDINGS: Bilateral pleural effusions are again seen. Vascular congestion is noted. Right chest wall port is again noted. The overall appearance is similar to that seen on the prior exam. IMPRESSION: Stable bilateral pleural effusions and vascular congestion similar to that seen on the prior exam. Electronically Signed   By: Inez Catalina M.D.   On: 11/04/2019 23:52   DG Chest Port 1 View  Result Date: 11/04/2019 CLINICAL DATA:  Follow-up pleural effusions. EXAM: PORTABLE CHEST 1 VIEW COMPARISON:  Multiple previous chest films. The most recent is 11/01/2019 FINDINGS: The right IJ power port is stable. Persistent moderate-sized bilateral pleural effusions with significant overlying atelectasis. Central vascular congestion and possible mild perihilar interstitial edema. IMPRESSION: Persistent moderate-sized bilateral pleural effusions with significant overlying atelectasis. Electronically Signed   By: Marijo Sanes M.D.   On: 11/04/2019 06:14   DG CHEST PORT 1  VIEW  Result Date: 11/01/2019 CLINICAL DATA:  Status post  thoracentesis EXAM: PORTABLE CHEST 1 VIEW COMPARISON:  10/31/19 FINDINGS: Cardiac shadow is stable. Right chest wall port is noted. Small pleural effusions are noted bilaterally with basilar atelectasis. No pneumothorax is seen. IMPRESSION: Small pleural effusions bilaterally with basilar atelectasis. No pneumothorax is identified. Electronically Signed   By: Inez Catalina M.D.   On: 11/01/2019 01:48   DG Chest Port 1 View  Result Date: 10/31/2019 CLINICAL DATA:  Status post left thoracentesis. EXAM: PORTABLE CHEST 1 VIEW COMPARISON:  October 31, 2019 FINDINGS: The left-sided pleural effusion is smaller after thoracentesis. No pneumothorax. Bilateral pleural effusions remain. The right Port-A-Cath is stable. No other interval changes. IMPRESSION: The left-sided pleural effusion is smaller after thoracentesis. No pneumothorax. No other changes. Electronically Signed   By: Dorise Bullion III M.D   On: 10/31/2019 10:29   DG Chest Port 1 View  Result Date: 10/31/2019 CLINICAL DATA:  Follow-up pleural effusion EXAM: PORTABLE CHEST 1 VIEW COMPARISON:  10/30/2019 FINDINGS: Power port unchanged. Bilateral effusions, larger on the left than the right. Volume loss in the lower lungs, worse on the left than the right. No new radiographic finding. IMPRESSION: No change. Large bilateral effusions left more than right with dependent volume loss left more than right. Electronically Signed   By: Nelson Chimes M.D.   On: 10/31/2019 06:00   DG Chest Port 1 View  Result Date: 10/30/2019 CLINICAL DATA:  Respiratory failure. EXAM: PORTABLE CHEST 1 VIEW COMPARISON:  Chest radiograph 10/29/2019 FINDINGS: Right anterior chest wall Port-A-Cath is present with tip projecting over the superior vena cava. Monitoring leads overlie the patient. Stable enlarged cardiac and mediastinal contours. Persistent moderate to large layering bilateral pleural effusions with underlying pulmonary consolidation. No pneumothorax. IMPRESSION:  Persistent moderate to large layering bilateral effusions with underlying consolidation. Electronically Signed   By: Lovey Newcomer M.D.   On: 10/30/2019 04:58   DG Chest Port 1 View  Result Date: 10/29/2019 CLINICAL DATA:  Shortness of breath EXAM: PORTABLE CHEST 1 VIEW COMPARISON:  09/01/2019, CT 08/25/2019 FINDINGS: Right-sided central venous port tip over the cavoatrial region allowing for rotated patient. Suspected moderate layering right pleural effusion, probably increased from prior radiograph. Moderate left pleural effusion, also suspect increase in size. Enlarged cardiomediastinal silhouette with vascular congestion. Consolidation at the left lung base. Fluid in the right fissure. Hazy appearance of both thoraces likely due to layering fluid although underlying pulmonary edema could be contributing. No pneumothorax. IMPRESSION: Hazy appearance of the bilateral lungs since 09/01/2019, suspected to be secondary to moderate layering pleural effusions, probably increased in the interim. Cardiomegaly with vascular congestion; hazy lung appearance could also be due to associated edema. Atelectasis or pneumonia at the left base. Electronically Signed   By: Donavan Foil M.D.   On: 10/29/2019 15:19   VAS [place] LOWER EXTREMITY VENOUS (DVT)  Result Date: 11/09/2019  Lower Venous DVTStudy Indications: Swelling.  Limitations: Poor ultrasound/tissue interface. Comparison Study: 08/18/2019 - Negative for DVT. Performing Technologist: Oliver Hum RVT  Examination Guidelines: A complete evaluation includes B-mode imaging, spectral Doppler, color Doppler, and power Doppler as needed of all accessible portions of each vessel. Bilateral testing is considered an integral part of a complete examination. Limited examinations for reoccurring indications may be performed as noted. The reflux portion of the exam is performed with the patient in reverse Trendelenburg.   +---------+---------------+---------+-----------+----------+--------------+ RIGHT    CompressibilityPhasicitySpontaneityPropertiesThrombus Aging +---------+---------------+---------+-----------+----------+--------------+ CFV      Full  Yes      Yes                                 +---------+---------------+---------+-----------+----------+--------------+ SFJ      Full                                                        +---------+---------------+---------+-----------+----------+--------------+ FV Prox  Full                                                        +---------+---------------+---------+-----------+----------+--------------+ FV Mid   Full                                                        +---------+---------------+---------+-----------+----------+--------------+ FV DistalFull                                                        +---------+---------------+---------+-----------+----------+--------------+ PFV      Full                                                        +---------+---------------+---------+-----------+----------+--------------+ POP      Full           Yes      Yes                                 +---------+---------------+---------+-----------+----------+--------------+ PTV      Full                                                        +---------+---------------+---------+-----------+----------+--------------+ PERO     Full                                                        +---------+---------------+---------+-----------+----------+--------------+   +---------+---------------+---------+-----------+----------+--------------+ LEFT     CompressibilityPhasicitySpontaneityPropertiesThrombus Aging +---------+---------------+---------+-----------+----------+--------------+ CFV      Full           Yes      Yes                                  +---------+---------------+---------+-----------+----------+--------------+ SFJ  Full                                                        +---------+---------------+---------+-----------+----------+--------------+ FV Prox  Full                                                        +---------+---------------+---------+-----------+----------+--------------+ FV Mid   Full                                                        +---------+---------------+---------+-----------+----------+--------------+ FV DistalFull                                                        +---------+---------------+---------+-----------+----------+--------------+ PFV      Full                                                        +---------+---------------+---------+-----------+----------+--------------+ POP      None           No       No                   Acute          +---------+---------------+---------+-----------+----------+--------------+ PTV      Partial                                      Acute          +---------+---------------+---------+-----------+----------+--------------+ PERO     Partial                                      Acute          +---------+---------------+---------+-----------+----------+--------------+ Gastroc  Partial                                      Acute          +---------+---------------+---------+-----------+----------+--------------+     Summary: RIGHT: - There is no evidence of deep vein thrombosis in the lower extremity.  - No cystic structure found in the popliteal fossa. - Ultrasound characteristics of enlarged lymph nodes are noted in the groin.  LEFT: - Findings consistent with acute deep vein thrombosis involving the left popliteal vein, left posterior tibial veins, left peroneal veins, and left gastrocnemius veins. - No cystic structure found in the popliteal fossa.  *See table(s) above for measurements and observations.  Electronically signed  by Deitra Mayo MD on 11/09/2019 at 3:11:22 PM.    Final     ASSESSMENT & PLAN Isaac Pierce 59 y.o. male with medical history significant for CLL diagnosed in Nov 2020 and bilateral Pleurx catheters who presents with worsening shortness of breath.  After review the labs and discussion with the patient's the etiology of his worsening shortness of breath, pain, and poor p.o. intake are not clear.  I do fear that the patient's cancer is progressing on his out acalabrutinib therapy.  He appears to have very limited options for the treatment of his disease given his severe reactions to rituximab and obinutuzumab.    I would recommend that we initially stabilize the patient and then shift our focus towards the cancer once we have optimized his medical status.  In order to do this I would recommend broad-spectrum antibiotics and transfusion threshold of hemoglobin of 8.  While the patient is in house I would recommend we hold his acalabrutinib therapy.  The oncology service will continue to follow the patient while he is admitted to help provide guidance and be available for [place] discussions.    #Acute on Chronic Respiratory Failure --agree with admission to ICU for close monitoring of vitals and respiratory status --concern for bacterial PNA, agree with broad spectrum abx per primary team --consider higher transfusion threshold of Hgb >8.0 given that he is hypoxic and symptomatic  #Chronic Lymphocytic Leukemia/B Cell Lymphoma --HOLD home acalabrutinib therapy at this time --pancytopenia is likely 2/2 to patient's malignancy. Set transfusion goals of Hgb >8.0 and Plt >10. No clear indication for GCSF therapy at this time.  --defer decision for [place] discussion to patient's primary Oncologist --Hematology will continue to monitor.   A total of more than 50 minutes were spent on this encounter and over half of that time was spent on counseling and coordination of care as outlined  above.   Ledell Peoples, MD Department of Hematology/Oncology [place] [facility] Phone: [phone] Pager: [phone] Email: [email or web address]  11/20/2019 6:40 PM

## 2019-11-21 NOTE — Progress Notes (Signed)
Pharmacy Antibiotic Note  Alexsandro Wacha is a 59 y.o. male admitted on 11/04/2019 with pneumonia.  Pharmacy has been consulted for vanc/cefepime dosing.  Plan: 1) Vanc 1250mg  x 1 then 750mg  IV q12 - goal AUC 400-550 2) Cefepime 2g IV q8  Height: 5\' 11"  (180.3 cm) Weight: 128 lb 4.9 oz (58.2 kg) IBW/kg (Calculated) : 75.3  Temp (24hrs), Avg:100.6 F (38.1 C), Min:100.6 F (38.1 C), Max:100.6 F (38.1 C)  Recent Labs  Lab 11/09/2019 1356 11/05/2019 1556  WBC 3.0*  --   CREATININE 0.78  --   LATICACIDVEN 4.4* 3.7*    Estimated Creatinine Clearance: 84.9 mL/min (by C-G formula based on SCr of 0.78 mg/dL).    Allergies  Allergen Reactions  . Obinutuzumab Shortness Of Breath    Reaction on 08/28/2020  . Rituximab Rash    Had rash, anxiety, HTN & tachycardia ~14min into 1st infusion 08/27/19      Thank you for allowing pharmacy to be a part of this patient's care.  Kara Mead 11/13/2019 6:17 PM

## 2019-11-21 NOTE — ED Notes (Signed)
Date and time results received: 11/04/2019 1516 (use smartphrase ".now" to insert current time)  Test: Hbg Critical Value: 6.6  Name of Provider Notified: Dr. Alvino Chapel  Orders Received? Or Actions Taken?: Orders Received - See Orders for details

## 2019-11-21 NOTE — ED Notes (Addendum)
Drained left lung, 750 red/tea color fluid out

## 2019-11-21 NOTE — H&P (Addendum)
NAME:  Isaac Pierce, MRN:  [id number], DOB:  1961/07/12, LOS: 0 ADMISSION DATE:  11/22/2019, CONSULTATION DATE: 11/28/2019 REFERRING MD: Dr. Alvino Chapel, ED, CHIEF COMPLAINT: Dyspnea, fatigue  Brief History   59 Y/O with CLL/small cell B lymphoma, With recent admission from 1/29-2/12 with allergic reaction to [place]. During that admission he had bilateral recurrent effusions status post Pleurx catheters (placed on 2/9) and new diagnosis of DVT (discharged with eliquis)  Admitted back progressive dyspnea for the past 4 days.  Denies any cough, sputum production.  He is febrile in ED 100.4 with lactic acidosis and hemoglobin of 6.6.  He has been draining his catheter every other day- on the right side with 1 L drainage on 2/20 and left side was drained on 2/19 and is due to be drained again.   Past Medical History    has a past medical history of Anxiety, Back pain, and GERD (gastroesophageal reflux disease).  CLL, B-cell lymphoma, DVT, effusions, DVT  He has significant hypersensitivity reaction to Rituxan,obinutuzumab, failed CHOP chemotherapy, currently on single agent therapy with acalabrutinib  Significant Hospital Events   2/21-admit  Consults:  PCCM  Procedures:    Significant Diagnostic Tests:  Chest x-ray 2/21-bilateral effusion, airspace disease  Micro Data:  Blood cultures Sputum culture Urine culture  Antimicrobials:  Vanco Cefepime  Interim history/subjective:    Objective   Blood pressure (!) 104/52, pulse (!) 141, temperature (!) 100.6 F (38.1 C), temperature source Oral, resp. rate (!) 27, height 5\' 11"  (1.803 m), weight 58.2 kg, SpO2 94 %.        Intake/Output Summary (Last 24 hours) at 11/11/2019 1734 Last data filed at 11/17/2019 1723 Gross per 24 hour  Intake 4129.63 ml  Output --  Net 4129.63 ml   Filed Weights   11/01/2019 1348  Weight: 58.2 kg    Examination: Gen:      Pale, chronically ill-appearing in moderate distress HEENT:  EOMI,  sclera anicteric Neck:     No masses; no thyromegaly Lungs:    Scattered bilateral crackles CV:         Regular rate and rhythm; no murmurs Abd:      + bowel sounds; soft, non-tender; no palpable masses, no distension Ext:    No edema; adequate peripheral perfusion Skin:      Warm and dry; no rash Neuro: Awake, alert  Resolved Hospital Problem list     Assessment & Plan:  Acute respiratory failure, sepsis Possible HCAP given lactic acidosis, bilateral infiltrates chest x-ray Start Vanco, cefepime Check procalcitonin.   IV fluid hydration.  He has received 2 L so far of LR.  Will give additional liter of LR and start drip at 100 cc an hour Drain left Pleurx catheter now.  Follow chest x-ray  Acute anemia, thrombocytopenia No evidence of active bleed.  May be related to CLL Transfuse 2 units PRBC. Follow CBC  Left lower extremity DVT Stop Eliquis Start heparin tomorrow when we are sure there is no bleed and if Hb is stable. Consider CTA to eval for PE if his dyspnea, tachycardia persists.  CLL/lymphoma Hold outpatient therapy for now  Sinus tachycardia Hold outpatient Cardizem due to hypotension  Code status Discussed code status with patient.  He wants full measures including CPR, ventilator on a temporary basis but not for long-term  Best practice:  Diet: NPO Pain/Anxiety/Delirium protocol (if indicated):NA VAP protocol (if indicated): NA DVT prophylaxis: Heparin gtt GI prophylaxis: Pepcid Glucose control: [place] Mobility: Bed Code Status:  Full Family Communication: Patient updated Disposition: ICU  Labs   CBC: Recent Labs  Lab 11/15/2019 1356  WBC 3.0*  NEUTROABS 1.5*  HGB 6.6*  HCT 20.9*  MCV 89.7  PLT 55*    Basic Metabolic Panel: Recent Labs  Lab 11/20/2019 1356  NA 129*  K 4.5  CL 98  CO2 20*  GLUCOSE 111*  BUN 31*  CREATININE 0.78  CALCIUM 7.5*   GFR: Estimated Creatinine Clearance: 84.9 mL/min (by C-G formula based on SCr of 0.78  mg/dL). Recent Labs  Lab 11/08/2019 1356 11/05/2019 1556  WBC 3.0*  --   LATICACIDVEN 4.4* 3.7*    Liver Function Tests: Recent Labs  Lab 11/06/2019 1356  AST 29  ALT 18  ALKPHOS 384*  BILITOT 0.4  PROT 4.1*  ALBUMIN 1.6*   No results for input(s): LIPASE, AMYLASE in the last 168 hours. No results for input(s): AMMONIA in the last 168 hours.  ABG    Component Value Date/Time   PHART 7.404 10/29/2019 2140   PCO2ART 42.5 10/29/2019 2140   PO2ART 59.5 (L) 10/29/2019 2140   HCO3 26.0 10/29/2019 2140   ACIDBASEDEF 1.4 10/29/2019 1404   O2SAT 87.4 10/29/2019 2140     Coagulation Profile: Recent Labs  Lab 11/02/2019 1356  INR 2.3*    Cardiac Enzymes: No results for input(s): CKTOTAL, CKMB, CKMBINDEX, TROPONINI in the last 168 hours.  HbA1C: Hgb A1c MFr Bld  Date/Time Value Ref Range Status  10/30/2019 10:00 AM 5.7 (H) 4.8 - 5.6 % Final    Comment:    (NOTE) Pre diabetes:          5.7%-6.4% Diabetes:              >6.4% Glycemic control for   <7.0% adults with diabetes     CBG: No results for input(s): GLUCAP in the last 168 hours.  Review of Systems:   REVIEW OF SYSTEMS:   All negative; except for those that are bolded, which indicate positives.  Constitutional: weight loss, weight gain, night sweats, fevers, chills, fatigue, weakness.  HEENT: headaches, sore throat, sneezing, nasal congestion, post nasal drip, difficulty swallowing, tooth/dental problems, visual complaints, visual changes, ear aches. Neuro: difficulty with speech, weakness, numbness, ataxia. CV:  chest pain, orthopnea, PND, swelling in lower extremities, dizziness, palpitations, syncope.  Resp: cough, hemoptysis, dyspnea, wheezing. GI: heartburn, indigestion, abdominal pain, nausea, vomiting, diarrhea, constipation, change in bowel habits, loss of appetite, hematemesis, melena, hematochezia.  GU: dysuria, change in color of urine, urgency or frequency, flank pain, hematuria. MSK: joint pain or  swelling, decreased range of motion. Psych: change in mood or affect, depression, anxiety, suicidal ideations, homicidal ideations. Skin: rash, itching, bruising.  Past Medical History  He,  has a past medical history of Anxiety, Back pain, and GERD (gastroesophageal reflux disease).   Surgical History    Past Surgical History:  Procedure Laterality Date  . IR IMAGING GUIDED PORT INSERTION  08/30/2019     Social History   reports that he has been smoking cigarettes. He has never used smokeless tobacco. He reports previous alcohol use. He reports previous drug use.   Family History   His family history includes Lymphoma in his father and mother.   Allergies Allergies  Allergen Reactions  . Obinutuzumab Shortness Of Breath    Reaction on 08/28/2020  . Rituximab Rash    Had rash, anxiety, HTN & tachycardia ~60min into 1st infusion 08/27/19     Home Medications  Prior to Admission medications  Medication Sig Start Date End Date Taking? Authorizing Provider  acalabrutinib (CALQUENCE) 100 MG capsule Take 1 capsule (100 mg total) by mouth 2 (two) times daily. 10/15/19  Yes Curt Bears, MD  albuterol (VENTOLIN HFA) 108 (90 Base) MCG/ACT inhaler Inhale 2 puffs into the lungs every 6 (six) hours as needed for wheezing or shortness of breath. 09/06/19 11/12/2019 Yes Shelly Coss, MD  allopurinol (ZYLOPRIM) 300 MG tablet Take 1 tablet (300 mg total) by mouth daily. 10/05/19  Yes Curt Bears, MD  apixaban (ELIQUIS) 5 MG TABS tablet Take 1 tablet (5 mg total) by mouth 2 (two) times daily. 11/16/19  Yes Omar Person, NP  diltiazem (CARDIZEM) 60 MG tablet Take 1 tablet (60 mg total) by mouth every 8 (eight) hours. 11/12/19  Yes Omar Person, NP  folic acid (FOLVITE) 1 MG tablet Take 1 tablet by mouth once daily 10/14/19  Yes Curt Bears, MD  furosemide (LASIX) 20 MG tablet Take 2 tablets (40 mg total) by mouth daily. 11/12/19  Yes Omar Person, NP    HYDROcodone-acetaminophen (NORCO) 5-325 MG tablet 1/2 to 1 tab PO q 6 hours prn pain 11/12/19  Yes Ennever, Rudell Cobb, MD  hydrOXYzine (ATARAX/VISTARIL) 10 MG tablet Take 1 tablet (10 mg total) by mouth every 8 (eight) hours as needed. 10/05/19  Yes Curt Bears, MD  lidocaine-prilocaine (EMLA) cream Apply 1 application topically as needed. 10/28/19  Yes Tanner, Lyndon Code., PA-C  omeprazole (PRILOSEC) 20 MG capsule Take 1 capsule (20 mg total) by mouth daily. 10/05/19  Yes Curt Bears, MD  spironolactone (ALDACTONE) 50 MG tablet Take 1 tablet (50 mg total) by mouth 2 (two) times daily. 11/12/19  Yes Omar Person, NP  traMADol (ULTRAM) 50 MG tablet Take 1 tablet (50 mg total) by mouth every 6 (six) hours as needed for moderate pain. 11/12/19  Yes Ennever, Rudell Cobb, MD  apixaban (ELIQUIS) 5 MG TABS tablet Take 2 tablets (10 mg total) by mouth 2 (two) times daily for 4 days. Patient not taking: Reported on 11/01/2019 11/12/19 11/16/19  Omar Person, NP  hydrocortisone (ANUSOL-HC) 2.5 % rectal cream Place rectally 3 (three) times daily. Patient not taking: Reported on 11/18/2019 09/06/19   Shelly Coss, MD  predniSONE (DELTASONE) 20 MG tablet Please take 1 tablet (20 mg) daily for 7 days, followed by 1/2 tab daily for 7 days Patient not taking: Reported on 11/08/2019 11/12/19   Omar Person, NP     Critical care time: 67    The patient is critically ill with multiple organ system failure and requires high complexity decision making for assessment and support, frequent evaluation and titration of therapies, advanced monitoring, review of radiographic studies and interpretation of complex data.   Critical Care Time devoted to patient care services, exclusive of separately billable procedures, described in this note is 35 minutes.   Marshell Garfinkel MD [facility] Please see [email or web address] for pager details.  11/24/2019, 5:56 PM

## 2019-11-21 NOTE — ED Triage Notes (Signed)
Pt to ED with increasing SOB over the past week. Patient has lymphoma. When [facility] arrived patient spO2 was 97% but patient is tachyphenic and labored only able to get couple words out. Pt was in hospital a week ago with two drains placed in each lung. Pt also tachycardic and patient states haven't been able to keep anything down.  [id number]

## 2019-11-21 NOTE — ED Provider Notes (Signed)
[facility] DEPT Provider Note   CSN: [id number] Arrival date & time: 11/11/2019  1333     History Chief Complaint  Patient presents with   Shortness of Breath    Isaac Pierce is a 59 y.o. male.  HPI Patient presents with shortness of breath and fatigue over the last week.  On oral chemotherapy for lymphoma.  Has bilateral Pleurx drains for recurrent pleural effusions.  Starting on rounds Monday last week with today being Sunday patient became more fatigued.  Decreased oral intake decreased appetite.  He has had drainage out of his right Pleurx catheter.  States it was a liter yesterday and about 600 the day before.  States he is unsure about the left drain because he states he has not drained that.  States he is not sure what medications he has been taking.  States he really has not been able eat and drink anything.  States he is very fatigued and has been unable to get up.  Found to have a temperature upon arrival.  Has had a little bit of a cough.  No diarrhea.  No dysuria.  He is dyspneic.    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 ([place])    Anemia    Shock ([place])    Pressure injury of skin 08/26/2019   Non-Hodgkin lymphoma ([place]) 08/25/2019   Protein-calorie malnutrition, severe 08/19/2019   Malignancy ([place]) 08/17/2019   Severe sepsis ([place]) 08/17/2019   Pancytopenia ([place]) 08/17/2019   SOB (shortness of breath) 08/17/2019   Pleural effusion 08/17/2019   Hyponatremia 08/17/2019      Past Surgical History:  Procedure Laterality Date   IR IMAGING GUIDED PORT INSERTION  08/30/2019       Family History  Problem Relation Age of Onset   Lymphoma Mother    Lymphoma Father     Social History   Tobacco Use   Smoking status: Current Every Day Smoker    Types: Cigarettes   Smokeless tobacco: Never Used  Substance Use Topics   Alcohol use: Not Currently   Drug use: Not Currently    Home Medications Prior to Admission medications   Medication Sig Start Date End Date Taking? Authorizing Provider  acalabrutinib (CALQUENCE) 100 MG capsule Take 1 capsule (100 mg total) by mouth 2 (two) times daily. 10/15/19  Yes Curt Bears, MD  albuterol (VENTOLIN HFA) 108 (90 Base) MCG/ACT inhaler Inhale 2 puffs into the lungs every 6 (six) hours as needed for wheezing or shortness of breath. 09/06/19 11/13/2019 Yes Shelly Coss, MD  allopurinol (ZYLOPRIM) 300 MG tablet Take 1 tablet (300 mg total) by mouth daily. 10/05/19  Yes Curt Bears, MD  apixaban (ELIQUIS) 5 MG TABS tablet Take 1 tablet (5 mg total) by mouth 2 (two) times daily. 11/16/19  Yes Omar Person, NP  diltiazem (CARDIZEM) 60 MG tablet Take 1 tablet (60 mg total) by mouth every 8 (eight) hours. 11/12/19  Yes Omar Person, NP  folic acid (FOLVITE) 1 MG tablet Take 1 tablet by mouth once  daily 10/14/19  Yes Curt Bears, MD  furosemide (LASIX) 20 MG tablet Take 2 tablets (40 mg total) by mouth daily. 11/12/19  Yes Omar Person, NP  HYDROcodone-acetaminophen (NORCO) 5-325 MG tablet 1/2 to 1 tab PO q 6 hours prn pain 11/12/19  Yes Ennever, Rudell Cobb, MD  hydrOXYzine (ATARAX/VISTARIL) 10 MG tablet Take 1 tablet (10 mg total) by mouth every 8 (eight) hours as needed. 10/05/19  Yes Curt Bears, MD  lidocaine-prilocaine (EMLA) cream Apply 1 application topically as needed. 10/28/19  Yes Tanner, Lyndon Code., PA-C  omeprazole (PRILOSEC) 20 MG capsule Take 1 capsule (20 mg total) by mouth daily. 10/05/19   Yes Curt Bears, MD  spironolactone (ALDACTONE) 50 MG tablet Take 1 tablet (50 mg total) by mouth 2 (two) times daily. 11/12/19  Yes Omar Person, NP  traMADol (ULTRAM) 50 MG tablet Take 1 tablet (50 mg total) by mouth every 6 (six) hours as needed for moderate pain. 11/12/19  Yes Ennever, Rudell Cobb, MD  apixaban (ELIQUIS) 5 MG TABS tablet Take 2 tablets (10 mg total) by mouth 2 (two) times daily for 4 days. Patient not taking: Reported on 11/11/2019 11/12/19 11/16/19  Omar Person, NP  hydrocortisone (ANUSOL-HC) 2.5 % rectal cream Place rectally 3 (three) times daily. Patient not taking: Reported on 11/17/2019 09/06/19   Shelly Coss, MD  predniSONE (DELTASONE) 20 MG tablet Please take 1 tablet (20 mg) daily for 7 days, followed by 1/2 tab daily for 7 days Patient not taking: Reported on 11/22/2019 11/12/19   Omar Person, NP    Allergies    Obinutuzumab and Rituximab  Review of Systems   Review of Systems  Constitutional: Positive for appetite change and fatigue.  HENT: Negative for congestion.   Respiratory: Positive for shortness of breath.   Cardiovascular: Positive for chest pain.  Gastrointestinal: Negative for abdominal pain.  Musculoskeletal: Negative for back pain.  Skin: Negative for rash.  Neurological: Positive for weakness.    Physical Exam Updated Vital Signs BP (!) 106/56    Pulse (!) 133    Temp (!) 100.6 F (38.1 C) (Oral)    Resp (!) 27    Ht 5\' 11"  (1.803 m)    Wt 58.2 kg    SpO2 94%    BMI 17.90 kg/m   Physical Exam Vitals and nursing note reviewed.  HENT:     Head: Normocephalic.  Cardiovascular:     Rate and Rhythm: Regular rhythm. Tachycardia present.  Pulmonary:     Effort: Tachypnea present.     Comments: Harsh breath sounds throughout, decreased on left. Chest:     Comments: Bilateral Pleurx catheters.  Right Pleurx catheter examined and no erythema although there is some erythema on the left catheter without frank purulence.   Does seem somewhat loose of the skin.  Some tenderness right chest wall and right upper abdomen.  Port to right chest wall. Abdominal:     Tenderness: There is abdominal tenderness.  Musculoskeletal:     Cervical back: Neck supple.     Right lower leg: No tenderness.     Left lower leg: No tenderness.  Neurological:     Mental Status: He is alert.     ED Results / Procedures / Treatments   Labs (all labs ordered are listed, but only abnormal results are displayed) Labs Reviewed  LACTIC ACID, PLASMA - Abnormal; Notable for the following components:      Result Value   Lactic Acid, Venous 4.4 (*)  All other components within normal limits  COMPREHENSIVE METABOLIC PANEL - Abnormal; Notable for the following components:   Sodium 129 (*)    CO2 20 (*)    Glucose, Bld 111 (*)    BUN 31 (*)    Calcium 7.5 (*)    Total Protein 4.1 (*)    Albumin 1.6 (*)    Alkaline Phosphatase 384 (*)    All other components within normal limits  CBC WITH DIFFERENTIAL/PLATELET - Abnormal; Notable for the following components:   WBC 3.0 (*)    RBC 2.33 (*)    Hemoglobin 6.6 (*)    HCT 20.9 (*)    RDW 17.2 (*)    Platelets 55 (*)    Neutro Abs 1.5 (*)    All other components within normal limits  APTT - Abnormal; Notable for the following components:   aPTT 38 (*)    All other components within normal limits  PROTIME-INR - Abnormal; Notable for the following components:   Prothrombin Time 24.8 (*)    INR 2.3 (*)    All other components within normal limits  CULTURE, BLOOD (ROUTINE X 2)  CULTURE, BLOOD (ROUTINE X 2)  URINE CULTURE  SARS CORONAVIRUS 2 (TAT 6-24 HRS)  URINALYSIS, ROUTINE W REFLEX MICROSCOPIC  LACTIC ACID, PLASMA  POC SARS CORONAVIRUS 2 AG -  ED  PREPARE RBC (CROSSMATCH)  TYPE AND SCREEN    EKG EKG Interpretation  Date/Time:  Sunday November 21 2019 13:48:17 EST Ventricular Rate:  147 PR Interval:    QRS Duration: 74 QT Interval:  249 QTC Calculation: 390 R  Axis:   36 Text Interpretation: Sinus tachycardia Borderline low voltage, extremity leads Nonspecific T abnormalities, lateral leads Confirmed by Davonna Belling [phone]) on 11/05/2019 2:11:27 PM   Radiology DG Chest Port 1 View  Result Date: 11/17/2019 CLINICAL DATA:  Shortness of breath and fever EXAM: PORTABLE CHEST 1 VIEW COMPARISON:  Chest radiograph dated 11/09/2019 FINDINGS: A right internal jugular central venous port catheter tip overlies the cavoatrial junction. Bilateral pleural and a mediastinal drain appear unchanged in position compared to prior exam. The cardiac silhouette is unchanged. Small bilateral pleural effusions with associated atelectasis/airspace disease are not significantly changed. There is no pneumothorax. The osseous structures are intact. IMPRESSION: Small bilateral pleural effusions with associated atelectasis/airspace disease are not significantly changed compared to prior exam. Electronically Signed   By: Zerita Boers M.D.   On: 11/25/2019 14:49    Procedures Procedures (including critical care time)  Medications Ordered in ED Medications  vancomycin (VANCOREADY) IVPB 1250 mg/250 mL (1,250 mg Intravenous New Bag/Given 11/11/2019 1535)  0.9 %  sodium chloride infusion (has no administration in time range)  lactated ringers bolus 1,000 mL (0 mLs Intravenous Stopped 11/03/2019 1522)    And  lactated ringers bolus 1,000 mL (0 mLs Intravenous Stopped 11/07/2019 1522)  ceFEPIme (MAXIPIME) 2 g in sodium chloride 0.9 % 100 mL IVPB (0 g Intravenous Stopped 11/06/2019 1504)  metroNIDAZOLE (FLAGYL) IVPB 500 mg (0 mg Intravenous Stopped 11/20/2019 1604)  fentaNYL (SUBLIMAZE) injection 100 mcg (100 mcg Intravenous Given 11/07/2019 1435)    ED Course  I have reviewed the triage vital signs and the nursing notes.  Pertinent labs & imaging results that were available during my care of the patient were reviewed by me and considered in my medical decision making (see chart for  details).    MDM Rules/Calculators/A&P  Patient with generalized weakness.  Tachycardia.  Does have a low-grade temperature at 100.6.  Blood pressure is somewhat decreased and map did go below 65.  Code sepsis had been called and given antibiotics and fluid bolus, however patient is on chemotherapy for lymphoma and have a hemoglobin of 6.6.  Also is somewhat pancytopenic but not neutropenic.  The hemoglobin/anemia could definitely be contributing to the hypotension tachycardia and not the severe sepsis but have been given fluid boluses and antibiotics.  Lactic acid elevated.  Will admit to ICU.  Also have discussed with Dr. Lorenso Courier, from hematology/oncology, who will see in consult.  CRITICAL CARE Performed by: Davonna Belling Total critical care time: 30 minutes Critical care time was exclusive of separately billable procedures and treating other patients. Critical care was necessary to treat or prevent imminent or life-threatening deterioration. Critical care was time spent personally by me on the following activities: development of treatment plan with patient and/or surrogate as well as nursing, discussions with consultants, evaluation of patient's response to treatment, examination of patient, obtaining history from patient or surrogate, ordering and performing treatments and interventions, ordering and review of laboratory studies, ordering and review of radiographic studies, pulse oximetry and re-evaluation of patient's condition.  Final Clinical Impression(s) / ED Diagnoses Final diagnoses:  Anemia, unspecified type  Sepsis, due to unspecified organism, unspecified whether acute organ dysfunction present ([place])  Pancytopenia [facility])    Rx / DC Orders ED Discharge Orders    None       Davonna Belling, MD 11/15/2019 1649

## 2019-11-21 NOTE — Progress Notes (Deleted)
ANTICOAGULATION CONSULT NOTE - Initial Consult  Pharmacy Consult for IV heparin Indication: DVT  Allergies  Allergen Reactions  . Obinutuzumab Shortness Of Breath    Reaction on 08/28/2020  . Rituximab Rash    Had rash, anxiety, HTN & tachycardia ~29min into 1st infusion 08/27/19    Patient Measurements: Height: 5\' 11"  (180.3 cm) Weight: 128 lb 4.9 oz (58.2 kg) IBW/kg (Calculated) : 75.3 Heparin Dosing Weight: 58  Vital Signs: Temp: 100.6 F (38.1 C) (02/21 1347) Temp Source: Oral (02/21 1347) BP: 104/52 (02/21 1700) Pulse Rate: 141 (02/21 1700)  Labs: Recent Labs    11/12/2019 1356  HGB 6.6*  HCT 20.9*  PLT 55*  APTT 38*  LABPROT 24.8*  INR 2.3*  CREATININE 0.78    Estimated Creatinine Clearance: 84.9 mL/min (by C-G formula based on SCr of 0.78 mg/dL).   Medical History: Past Medical History:  Diagnosis Date  . Anxiety   . Back pain   . GERD (gastroesophageal reflux disease)     Medications:  Scheduled:  . famotidine  20 mg Oral Daily   Infusions:  . sodium chloride    . lactated ringers    . lactated ringers      Assessment: 59 yo male with CLL and recent admission due to reaction to chemo drug has since been restarted on a different oral chemo agent. Pt with recent DVT on apixaban prior to admission with last dose reportedly at 1430 today 2/21. Baseline labs drawn except baseline heparin level. As a result of apixaban being taken at 1430 today, IV heparin will be not be started until 12 hours after this dose when the next apixaban dose would have been given. Heparin level likely to be falsely elevated due to recent apixaban administration so will check aPTT and heparin levels until both are correlating indicating that apixaban is no longer affecting the HL and has been eliminated from the body. Will need to monitor CBC very closely as baseline Hgb 6.6 and Plts 55  Goal of Therapy:  Heparin level 0.3-0.7 units/ml Monitor platelets by anticoagulation  protocol: Yes  APTT 66-102s   Plan:   At 0230 tomorrow 2/22, start IV heparin with NO bolus at a rate of 1200 units/hr  Will check a HL and aPTT 6 hours after start of IV heparin - see above for reasoning  Daily CBC   Kara Mead 11/03/2019,6:19 PM

## 2019-11-21 NOTE — Progress Notes (Signed)
[facility] Progress Note Patient Name: Rodrico Asplund DOB: [birth year] MRN: [id number]   Date of Service  11/06/2019  HPI/Events of Note  58 M CLL, recurrent pleural effusion s/p indwelling catheter, VTE on apixaban. Admitted for dyspnea noted to be febrile lactic acidosis and Hgb 6.6.  eICU Interventions  On broad spectrum antibiotics for pneumonia. Given Tramadol, Norco and Atarax as per patient request     Intervention Category Major Interventions: Sepsis - evaluation and management;Hemorrhage - evaluation and management Intermediate Interventions: Pain - evaluation and management Evaluation Type: New Patient Evaluation  Judd Lien 11/24/2019, 8:27 PM

## 2019-11-21 NOTE — Progress Notes (Signed)
A consult was received from an ED physician for Vancomycin and Cefepime per pharmacy dosing.    The patient's profile has been reviewed for ht/wt/allergies/indication/available labs.    A one time order has been placed for Vancomycin 1250mg  and Cefepime 2gm IV.    Further antibiotics/pharmacy consults should be ordered by admitting physician if indicated.                       Thank you, Everette Rank, PharmD 11/17/2019  2:00 PM

## 2019-11-22 ENCOUNTER — Encounter (HOSPITAL_COMMUNITY): Payer: Self-pay | Admitting: Pulmonary Disease

## 2019-11-22 ENCOUNTER — Telehealth: Payer: Self-pay | Admitting: Physician Assistant

## 2019-11-22 ENCOUNTER — Inpatient Hospital Stay: Payer: Medicaid Other

## 2019-11-22 ENCOUNTER — Inpatient Hospital Stay (HOSPITAL_COMMUNITY): Payer: Medicaid Other

## 2019-11-22 DIAGNOSIS — R6521 Severe sepsis with septic shock: Secondary | ICD-10-CM

## 2019-11-22 LAB — URINALYSIS, ROUTINE W REFLEX MICROSCOPIC
Bacteria, UA: NONE SEEN
Bilirubin Urine: NEGATIVE
Glucose, UA: NEGATIVE mg/dL
Ketones, ur: NEGATIVE mg/dL
Leukocytes,Ua: NEGATIVE
Nitrite: NEGATIVE
Protein, ur: NEGATIVE mg/dL
Specific Gravity, Urine: 1.015 (ref 1.005–1.030)
pH: 6 (ref 5.0–8.0)

## 2019-11-22 LAB — BPAM RBC
Blood Product Expiration Date: 202103212359
Blood Product Expiration Date: 202103212359
ISSUE DATE / TIME: 202102211958
ISSUE DATE / TIME: 202102212337
Unit Type and Rh: 5100
Unit Type and Rh: 5100

## 2019-11-22 LAB — CBC
HCT: 23.6 % — ABNORMAL LOW (ref 39.0–52.0)
Hemoglobin: 7.6 g/dL — ABNORMAL LOW (ref 13.0–17.0)
MCH: 27.7 pg (ref 26.0–34.0)
MCHC: 32.2 g/dL (ref 30.0–36.0)
MCV: 86.1 fL (ref 80.0–100.0)
Platelets: 53 10*3/uL — ABNORMAL LOW (ref 150–400)
RBC: 2.74 MIL/uL — ABNORMAL LOW (ref 4.22–5.81)
RDW: 17.4 % — ABNORMAL HIGH (ref 11.5–15.5)
WBC: 2.5 10*3/uL — ABNORMAL LOW (ref 4.0–10.5)
nRBC: 0 % (ref 0.0–0.2)

## 2019-11-22 LAB — TYPE AND SCREEN
ABO/RH(D): O POS
Antibody Screen: NEGATIVE
Unit division: 0
Unit division: 0

## 2019-11-22 LAB — BASIC METABOLIC PANEL
Anion gap: 8 (ref 5–15)
BUN: 29 mg/dL — ABNORMAL HIGH (ref 6–20)
CO2: 20 mmol/L — ABNORMAL LOW (ref 22–32)
Calcium: 7.4 mg/dL — ABNORMAL LOW (ref 8.9–10.3)
Chloride: 98 mmol/L (ref 98–111)
Creatinine, Ser: 0.52 mg/dL — ABNORMAL LOW (ref 0.61–1.24)
GFR calc Af Amer: >60 mL/min (ref >60)
GFR calc non Af Amer: >60 mL/min (ref >60)
Glucose, Bld: 84 mg/dL (ref 70–99)
Potassium: 3.9 mmol/L (ref 3.5–5.1)
Sodium: 126 mmol/L — ABNORMAL LOW (ref 135–145)

## 2019-11-22 LAB — DIC (DISSEMINATED INTRAVASCULAR COAGULATION)PANEL
D-Dimer, Quant: 1.37 ug/mL-FEU — ABNORMAL HIGH (ref 0.00–0.50)
Fibrinogen: 348 mg/dL (ref 210–475)
INR: 1.7 — ABNORMAL HIGH (ref 0.8–1.2)
Platelets: 52 10*3/uL — ABNORMAL LOW (ref 150–400)
Prothrombin Time: 19.6 seconds — ABNORMAL HIGH (ref 11.4–15.2)
Smear Review: NONE SEEN
aPTT: 34 seconds (ref 24–36)

## 2019-11-22 LAB — MAGNESIUM: Magnesium: 1.2 mg/dL — ABNORMAL LOW (ref 1.7–2.4)

## 2019-11-22 LAB — URINE CULTURE

## 2019-11-22 LAB — STREP PNEUMONIAE URINARY ANTIGEN: Strep Pneumo Urinary Antigen: NEGATIVE

## 2019-11-22 LAB — PROCALCITONIN: Procalcitonin: 5.29 ng/mL

## 2019-11-22 LAB — PHOSPHORUS: Phosphorus: 2.8 mg/dL (ref 2.5–4.6)

## 2019-11-22 MED ORDER — SODIUM CHLORIDE 0.9 % IV SOLN: INTRAVENOUS | Status: DC

## 2019-11-22 MED ORDER — ALBUMIN HUMAN 25 % IV SOLN
12.5000 g | Freq: Four times a day (QID) | INTRAVENOUS | Status: AC
  Administered 2019-11-22 – 2019-11-23 (×3): 12.5 g via INTRAVENOUS
  Filled 2019-11-22 (×3): qty 50

## 2019-11-22 MED ORDER — DILTIAZEM HCL 60 MG PO TABS
60.0000 mg | ORAL_TABLET | Freq: Three times a day (TID) | ORAL | Status: DC
  Administered 2019-11-22 – 2019-11-24 (×6): 60 mg via ORAL
  Filled 2019-11-22 (×6): qty 1

## 2019-11-22 MED ORDER — APIXABAN 5 MG PO TABS: 5.0000 mg | ORAL_TABLET | Freq: Two times a day (BID) | ORAL | Status: DC

## 2019-11-22 MED ORDER — HYDROXYZINE HCL 10 MG PO TABS
10.0000 mg | ORAL_TABLET | Freq: Once | ORAL | Status: AC
  Administered 2019-11-22: 10 mg via ORAL
  Filled 2019-11-22: qty 1

## 2019-11-22 MED ORDER — ACALABRUTINIB 100 MG PO CAPS: 100.0000 mg | ORAL_CAPSULE | Freq: Two times a day (BID) | ORAL | Status: DC

## 2019-11-22 MED ORDER — DILTIAZEM HCL 25 MG/5ML IV SOLN
10.0000 mg | Freq: Once | INTRAVENOUS | Status: AC
  Administered 2019-11-22: 10 mg via INTRAVENOUS
  Filled 2019-11-22: qty 5

## 2019-11-22 MED ORDER — HYDROXYZINE HCL 10 MG PO TABS
10.0000 mg | ORAL_TABLET | Freq: Three times a day (TID) | ORAL | Status: DC | PRN
  Administered 2019-11-22 – 2019-11-24 (×5): 10 mg via ORAL
  Filled 2019-11-22 (×5): qty 1

## 2019-11-22 MED ORDER — MAGNESIUM SULFATE 2 GM/50ML IV SOLN
2.0000 g | Freq: Once | INTRAVENOUS | Status: AC
  Administered 2019-11-22: 12:00:00 2 g via INTRAVENOUS
  Filled 2019-11-22: qty 50

## 2019-11-22 MED ORDER — ACETAMINOPHEN 325 MG PO TABS
650.0000 mg | ORAL_TABLET | Freq: Four times a day (QID) | ORAL | Status: DC | PRN
  Administered 2019-11-22 – 2019-11-23 (×2): 650 mg via ORAL
  Filled 2019-11-22 (×2): qty 2

## 2019-11-22 NOTE — Progress Notes (Signed)
HEMATOLOGY-ONCOLOGY PROGRESS NOTE  SUBJECTIVE: Resting quietly at the time my visit.  Seems comfortable this morning.  Nursing does not report any issues.  Oncology History  Non-Hodgkin lymphoma ([place])  08/25/2019 Initial Diagnosis   Non-Hodgkin lymphoma ([place])   08/25/2019 - 08/31/2019 Chemotherapy   The patient had riTUXimab (RITUXAN) 700 mg in sodium chloride 0.9 % 250 mL (2.1875 mg/mL) infusion, 375 mg/m2 = 700 mg (100 % of original dose 375 mg/m2), Intravenous,  Once, 1 of 1 cycle Dose modification: 375 mg/m2 (original dose 375 mg/m2, Cycle 1) Administration: 700 mg (08/25/2019) riTUXimab-pvvr (RUXIENCE) 700 mg in sodium chloride 0.9 % 250 mL (2.1875 mg/mL) infusion, 375 mg/m2 = 700 mg (100 % of original dose 375 mg/m2), Intravenous,  Once, 0 of 3 cycles Dose modification: 375 mg/m2 (original dose 375 mg/m2, Cycle 2), 375 mg/m2 (original dose 375 mg/m2, Cycle 3)  for chemotherapy treatment.    09/01/2019 - 10/12/2019 Chemotherapy   The patient had DOXOrubicin (ADRIAMYCIN) chemo injection 68 mg, 37.5 mg/m2 = 68 mg (75 % of original dose 50 mg/m2), Intravenous,  Once, 2 of 6 cycles Dose modification: 37.5 mg/m2 (75 % of original dose 50 mg/m2, Cycle 1, Reason: Provider Judgment) Administration: 68 mg (09/01/2019), 68 mg (09/22/2019) palonosetron (ALOXI) injection 0.25 mg, 0.25 mg, Intravenous,  Once, 2 of 6 cycles Administration: 0.25 mg (09/01/2019), 0.25 mg (09/22/2019) pegfilgrastim-cbqv (UDENYCA) injection 6 mg, 6 mg, Subcutaneous, Once, 1 of 5 cycles Administration: 6 mg (09/23/2019) vinCRIStine (ONCOVIN) 1.5 mg in sodium chloride 0.9 % 50 mL chemo infusion, 1.5 mg (75 % of original dose 2 mg), Intravenous,  Once, 2 of 6 cycles Dose modification: 1.5 mg (75 % of original dose 2 mg, Cycle 1, Reason: Provider Judgment) Administration: 1.5 mg (09/01/2019), 1.5 mg (09/22/2019) riTUXimab (RITUXAN) 700 mg in sodium chloride 0.9 % 250 mL (2.1875 mg/mL) infusion, 375 mg/m2 = 700 mg (100 % of  original dose 375 mg/m2), Intravenous,  Once, 1 of 1 cycle Dose modification: 375 mg/m2 (original dose 375 mg/m2, Cycle 1) Administration: 700 mg (09/01/2019) cyclophosphamide (CYTOXAN) 1,020 mg in sodium chloride 0.9 % 250 mL chemo infusion, 562.5 mg/m2 = 1,020 mg (75 % of original dose 750 mg/m2), Intravenous,  Once, 2 of 6 cycles Dose modification: 562.5 mg/m2 (75 % of original dose 750 mg/m2, Cycle 1, Reason: Provider Judgment) Administration: 1,020 mg (09/01/2019), 1,020 mg (09/22/2019) fosaprepitant (EMEND) 150 mg, dexamethasone (DECADRON) 12 mg in sodium chloride 0.9 % 145 mL IVPB, , Intravenous,  Once, 2 of 6 cycles Administration:  (09/01/2019),  (09/22/2019) riTUXimab-pvvr (RUXIENCE) 700 mg in sodium chloride 0.9 % 250 mL (2.1875 mg/mL) infusion, 375 mg/m2 = 700 mg, Intravenous,  Once, 1 of 1 cycle  for chemotherapy treatment.    10/22/2019 - 10/29/2019 Chemotherapy   The patient had obinutuzumab (GAZYVA) 100 mg in sodium chloride 0.9 % 100 mL (0.9615 mg/mL) chemo infusion, 100 mg, Intravenous, Once, 1 of 6 cycles Administration: 100 mg (10/22/2019), 1,000 mg (10/29/2019)  for chemotherapy treatment.       REVIEW OF SYSTEMS:   The patient is having intermittent fevers.  Continues to have shortness of breath. No bleeding noted.  The remainder of the review of systems noncontributory.  I have reviewed the past medical history, past surgical history, social history and family history with the patient and they are unchanged from previous note.   PHYSICAL EXAMINATION: ECOG PERFORMANCE STATUS: 2 - Symptomatic, <50% confined to bed  Vitals:   11/22/19 1100 11/22/19 1200  BP: (!) 102/56 Marland Kitchen)  105/56  Pulse: (!) 142 (!) 139  Resp: (!) 30 (!) 26  Temp:  (!) 100.4 F (38 C)  SpO2: 94% 95%   Filed Weights   11/03/2019 1348 11/13/2019 1943 11/22/19 0449  Weight: 128 lb 4.9 oz (58.2 kg) 133 lb 13.1 oz (60.7 kg) 133 lb 13.1 oz (60.7 kg)    Intake/Output from previous day: 02/21 0701 - 02/22  0700 In: 7318.5 [P.O.:480; I.V.:701.7; Blood:750; IV Piggyback:5386.7] Out: 1100 [Urine:350]  GENERAL: Chronically ill-appearing male, no distress LUNGS: Coarse breath sounds HEART: Tachycardic, irregular ABDOMEN:abdomen soft, non-tender and normal bowel sounds Musculoskeletal:no cyanosis of digits and no clubbing  NEURO: alert & oriented x 3 with fluent speech, no focal motor/sensory deficits  LABORATORY DATA:  I have reviewed the data as listed CMP Latest Ref Rng & Units 11/22/2019 11/05/2019 11/10/2019  Glucose 70 - 99 mg/dL 84 111(H) 98  BUN 6 - 20 mg/dL 29(H) 31(H) 25(H)  Creatinine 0.61 - 1.24 mg/dL 0.52(L) 0.78 0.56(L)  Sodium 135 - 145 mmol/L 126(L) 129(L) 130(L)  Potassium 3.5 - 5.1 mmol/L 3.9 4.5 4.3  Chloride 98 - 111 mmol/L 98 98 90(L)  CO2 22 - 32 mmol/L 20(L) 20(L) 32  Calcium 8.9 - 10.3 mg/dL 7.4(L) 7.5(L) 8.9  Total Protein 6.5 - 8.1 g/dL - 4.1(L) 4.4(L)  Total Bilirubin 0.3 - 1.2 mg/dL - 0.4 0.7  Alkaline Phos 38 - 126 U/L - 384(H) 165(H)  AST 15 - 41 U/L - 29 20  ALT 0 - 44 U/L - 18 28    Lab Results  Component Value Date   WBC 2.5 (L) 11/22/2019   HGB 7.6 (L) 11/22/2019   HCT 23.6 (L) 11/22/2019   MCV 86.1 11/22/2019   PLT 52 (L) 11/22/2019   NEUTROABS 1.5 (L) 11/11/2019    CT CHEST WO CONTRAST  Result Date: 10/29/2019 CLINICAL DATA:  With lymphoma, abnormal radiograph EXAM: CT CHEST WITHOUT CONTRAST TECHNIQUE: Multidetector CT imaging of the chest was performed following the standard protocol without IV contrast. COMPARISON:  Radiograph 10/28/2018, CT 08/25/2019 FINDINGS: Cardiovascular: Left IJ approach Port-A-Cath is accessed with the tip positioned at the right atrium. Normal cardiac size. Redemonstration of the extensive pericardial soft tissue attenuation lesions these appear to extend more superiorly towards the cardiac base than on comparison study including a larger crescentic amount of soft tissue measuring approximately 6.9 cm anteroposterior and  1.7 cm in thickness near the level of the pulmonary trunk (2/79). Central pulmonary arteries are normal caliber. Atherosclerotic plaque within the normal caliber aorta. Normal 3 vessel branching of the aortic arch with minimal plaque in the great vessels. Mediastinum/Nodes: Decrease in the size of bulky thoracic lymphadenopathy compatible with patient's known lymphoproliferative disorder. Index lymph nodes include: *A high right paratracheal node measuring 12 mm short axis (2/45), previously 23 mm *AP window lymph node measuring 24 mm (4/35), previously 29 mm *Right hilar node measuring 15 mm (2/85), previously 21 mm *Subcarinal node measuring 18 mm (2/69), previously 26 mm *Right inter lobar lymph node on comparison is difficult to assess given absence of contrast media. Thyroid gland is unremarkable. No acute abnormality of the trachea or esophagus. Lungs/Pleura: Large bilateral pleural effusions are noted. Dense bandlike areas of atelectasis are noted in both lungs. Multiple nodules are seen in the right middle and lower lobe examples include a 8 mm nodule laterally (4/102) in lower lobe and a subpleural 6 mm nodule anteriorly in the right middle lobe (4/98). No visible nodules seen in the left lung though  underlying atelectasis could obscure the presence of nodules or masses. Upper Abdomen: Marked splenomegaly. Some wedge-shaped hypoattenuation in the periphery of the spleen measuring 1.6 x 3.2 cm, compatible with sequela of remote splenic infarct. Indeterminate exophytic 12 mm lesion arising from the upper pole right kidney. Musculoskeletal: Multilevel degenerative changes are present in the imaged portions of the spine. Evaluation of the chest wall limited given extensive motion artifact which may limit detection of sternal and rib fractures. No gross acute osseous abnormality or suspicious osseous lesions. IMPRESSION: 1. Mixed response of patient's lymphoproliferative disease with overall decrease in the size  of bulky thoracic lymphadenopathy but with increasing pericardial soft tissue masses and multiple new lung nodules. 2. Large bilateral pleural effusions with associated compressive atelectasis. 3. 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 4. Indeterminate 12 mm lesion arising from the upper pole of the right kidney. Indeterminate on noncontrast CT. Could consider follow-up with nonemergent outpatient MR or CT. 5. Evaluation of the chest wall limited given extensive motion artifact which may limit detection of sternal and rib fractures. No gross acute osseous abnormality. 6.  Aortic Atherosclerosis (ICD10-I70.0). Electronically Signed   By: Lovena Le M.D.   On: 10/29/2019 20:36   DG Chest Port 1 View  Result Date: 11/22/2019 CLINICAL DATA:  Acute respiratory failure EXAM: PORTABLE CHEST 1 VIEW COMPARISON:  Radiograph 11/28/2019 FINDINGS: Right IJ approach Port-A-Cath tip terminates at the right atrium, currently accessed by [facility] needle. Bilateral pleural and mediastinal drains are in stable position. Telemetry leads overlie the chest. Small bilateral pleural effusions are again noted, not significantly changed from 1 day prior. A hazy basilar opacity likely reflecting combination of atelectasis and layering pleural fluid. Cardiomediastinal contours are stable. Underlying airspace disease is possible as well. No visible pneumothorax. No acute osseous or soft tissue abnormality. IMPRESSION: Stable appearance of the chest with small bilateral pleural effusions and bibasilar opacities likely reflecting atelectasis and/or airspace disease. Electronically Signed   By: Lovena Le M.D.   On: 11/22/2019 04:59   DG Chest Port 1 View  Result Date: 11/19/2019 CLINICAL DATA:  Shortness of breath and fever EXAM: PORTABLE CHEST 1 VIEW COMPARISON:  Chest radiograph dated 11/09/2019 FINDINGS: A right internal jugular central venous port catheter tip overlies the cavoatrial junction. Bilateral pleural  and a mediastinal drain appear unchanged in position compared to prior exam. The cardiac silhouette is unchanged. Small bilateral pleural effusions with associated atelectasis/airspace disease are not significantly changed. There is no pneumothorax. The osseous structures are intact. IMPRESSION: Small bilateral pleural effusions with associated atelectasis/airspace disease are not significantly changed compared to prior exam. Electronically Signed   By: Zerita Boers M.D.   On: 11/16/2019 14:49   DG CHEST PORT 1 VIEW  Result Date: 11/09/2019 CLINICAL DATA:  Pleural effusions. EXAM: PORTABLE CHEST 1 VIEW COMPARISON:  11/08/2019 FINDINGS: The right sided Port-A-Cath is stable. There are new bilateral PleurX drainage catheters. These appear to be in good position without complicating features. Slightly smaller pleural effusions. No pneumothorax. Stable cardiomediastinal silhouette. No pulmonary edema. IMPRESSION: New bilateral PleurX drainage catheters in good position without complicating features. Slight interval decrease in size of pleural effusions and overlying atelectasis. Electronically Signed   By: Marijo Sanes M.D.   On: 11/09/2019 16:37   DG CHEST PORT 1 VIEW  Result Date: 11/08/2019 CLINICAL DATA:  History of prior effusions EXAM: PORTABLE CHEST 1 VIEW COMPARISON:  11/06/2019 FINDINGS: Cardiac shadow is stable. Right chest wall port is again seen. Slight increase in  right-sided pleural effusion is noted. Stable left effusion is seen. Bibasilar atelectatic changes are noted. No bony abnormality is seen. IMPRESSION: Slight increase in right-sided pleural effusion when compared with the prior exam. Electronically Signed   By: Inez Catalina M.D.   On: 11/08/2019 13:35   DG Chest Port 1 View  Result Date: 11/06/2019 CLINICAL DATA:  Status post right thoracentesis. EXAM: PORTABLE CHEST 1 VIEW COMPARISON:  November 05, 2019. FINDINGS: Stable cardiomediastinal silhouette. Right internal jugular Port-A-Cath  is unchanged. No pneumothorax is noted. Right pleural effusion is smaller status post thoracentesis. Stable small left pleural effusion with associated atelectasis. Bony thorax is unremarkable. IMPRESSION: No pneumothorax status post right thoracentesis. Right pleural effusion is smaller. Stable small left pleural effusion with associated atelectasis. Electronically Signed   By: Marijo Conception M.D.   On: 11/06/2019 12:17   DG Chest Port 1 View  Result Date: 11/05/2019 CLINICAL DATA:  Status post left thoracentesis EXAM: PORTABLE CHEST 1 VIEW COMPARISON:  11/04/2019 FINDINGS: Slight interval reduction in volume of a left pleural effusion, now small, with associated atelectasis or consolidation. No pneumothorax. Unchanged moderate, layering right pleural effusion and associated atelectasis or consolidation. Right chest port catheter. IMPRESSION: Slight interval reduction in volume of a left pleural effusion, now small, with associated atelectasis or consolidation. No pneumothorax. Electronically Signed   By: Eddie Candle M.D.   On: 11/05/2019 19:06   DG CHEST PORT 1 VIEW  Result Date: 11/04/2019 CLINICAL DATA:  Shortness of breath EXAM: PORTABLE CHEST 1 VIEW COMPARISON:  11/04/2019 FINDINGS: Bilateral pleural effusions are again seen. Vascular congestion is noted. Right chest wall port is again noted. The overall appearance is similar to that seen on the prior exam. IMPRESSION: Stable bilateral pleural effusions and vascular congestion similar to that seen on the prior exam. Electronically Signed   By: Inez Catalina M.D.   On: 11/04/2019 23:52   DG Chest Port 1 View  Result Date: 11/04/2019 CLINICAL DATA:  Follow-up pleural effusions. EXAM: PORTABLE CHEST 1 VIEW COMPARISON:  Multiple previous chest films. The most recent is 11/01/2019 FINDINGS: The right IJ power port is stable. Persistent moderate-sized bilateral pleural effusions with significant overlying atelectasis. Central vascular congestion and  possible mild perihilar interstitial edema. IMPRESSION: Persistent moderate-sized bilateral pleural effusions with significant overlying atelectasis. Electronically Signed   By: Marijo Sanes M.D.   On: 11/04/2019 06:14   DG CHEST PORT 1 VIEW  Result Date: 11/01/2019 CLINICAL DATA:  Status post thoracentesis EXAM: PORTABLE CHEST 1 VIEW COMPARISON:  10/31/19 FINDINGS: Cardiac shadow is stable. Right chest wall port is noted. Small pleural effusions are noted bilaterally with basilar atelectasis. No pneumothorax is seen. IMPRESSION: Small pleural effusions bilaterally with basilar atelectasis. No pneumothorax is identified. Electronically Signed   By: Inez Catalina M.D.   On: 11/01/2019 01:48   DG Chest Port 1 View  Result Date: 10/31/2019 CLINICAL DATA:  Status post left thoracentesis. EXAM: PORTABLE CHEST 1 VIEW COMPARISON:  October 31, 2019 FINDINGS: The left-sided pleural effusion is smaller after thoracentesis. No pneumothorax. Bilateral pleural effusions remain. The right Port-A-Cath is stable. No other interval changes. IMPRESSION: The left-sided pleural effusion is smaller after thoracentesis. No pneumothorax. No other changes. Electronically Signed   By: Dorise Bullion III M.D   On: 10/31/2019 10:29   DG Chest Port 1 View  Result Date: 10/31/2019 CLINICAL DATA:  Follow-up pleural effusion EXAM: PORTABLE CHEST 1 VIEW COMPARISON:  10/30/2019 FINDINGS: Power port unchanged. Bilateral effusions, larger on the left  than the right. Volume loss in the lower lungs, worse on the left than the right. No new radiographic finding. IMPRESSION: No change. Large bilateral effusions left more than right with dependent volume loss left more than right. Electronically Signed   By: Nelson Chimes M.D.   On: 10/31/2019 06:00   DG Chest Port 1 View  Result Date: 10/30/2019 CLINICAL DATA:  Respiratory failure. EXAM: PORTABLE CHEST 1 VIEW COMPARISON:  Chest radiograph 10/29/2019 FINDINGS: Right anterior chest wall  Port-A-Cath is present with tip projecting over the superior vena cava. Monitoring leads overlie the patient. Stable enlarged cardiac and mediastinal contours. Persistent moderate to large layering bilateral pleural effusions with underlying pulmonary consolidation. No pneumothorax. IMPRESSION: Persistent moderate to large layering bilateral effusions with underlying consolidation. Electronically Signed   By: Lovey Newcomer M.D.   On: 10/30/2019 04:58   DG Chest Port 1 View  Result Date: 10/29/2019 CLINICAL DATA:  Shortness of breath EXAM: PORTABLE CHEST 1 VIEW COMPARISON:  09/01/2019, CT 08/25/2019 FINDINGS: Right-sided central venous port tip over the cavoatrial region allowing for rotated patient. Suspected moderate layering right pleural effusion, probably increased from prior radiograph. Moderate left pleural effusion, also suspect increase in size. Enlarged cardiomediastinal silhouette with vascular congestion. Consolidation at the left lung base. Fluid in the right fissure. Hazy appearance of both thoraces likely due to layering fluid although underlying pulmonary edema could be contributing. No pneumothorax. IMPRESSION: Hazy appearance of the bilateral lungs since 09/01/2019, suspected to be secondary to moderate layering pleural effusions, probably increased in the interim. Cardiomegaly with vascular congestion; hazy lung appearance could also be due to associated edema. Atelectasis or pneumonia at the left base. Electronically Signed   By: Donavan Foil M.D.   On: 10/29/2019 15:19   VAS [place] LOWER EXTREMITY VENOUS (DVT)  Result Date: 11/09/2019  Lower Venous DVTStudy Indications: Swelling.  Limitations: Poor ultrasound/tissue interface. Comparison Study: 08/18/2019 - Negative for DVT. Performing Technologist: Oliver Hum RVT  Examination Guidelines: A complete evaluation includes B-mode imaging, spectral Doppler, color Doppler, and power Doppler as needed of all accessible portions of each vessel.  Bilateral testing is considered an integral part of a complete examination. Limited examinations for reoccurring indications may be performed as noted. The reflux portion of the exam is performed with the patient in reverse Trendelenburg.  +---------+---------------+---------+-----------+----------+--------------+ RIGHT    CompressibilityPhasicitySpontaneityPropertiesThrombus Aging +---------+---------------+---------+-----------+----------+--------------+ CFV      Full           Yes      Yes                                 +---------+---------------+---------+-----------+----------+--------------+ SFJ      Full                                                        +---------+---------------+---------+-----------+----------+--------------+ FV Prox  Full                                                        +---------+---------------+---------+-----------+----------+--------------+ FV Mid   Full                                                        +---------+---------------+---------+-----------+----------+--------------+  FV DistalFull                                                        +---------+---------------+---------+-----------+----------+--------------+ PFV      Full                                                        +---------+---------------+---------+-----------+----------+--------------+ POP      Full           Yes      Yes                                 +---------+---------------+---------+-----------+----------+--------------+ PTV      Full                                                        +---------+---------------+---------+-----------+----------+--------------+ PERO     Full                                                        +---------+---------------+---------+-----------+----------+--------------+   +---------+---------------+---------+-----------+----------+--------------+ LEFT      CompressibilityPhasicitySpontaneityPropertiesThrombus Aging +---------+---------------+---------+-----------+----------+--------------+ CFV      Full           Yes      Yes                                 +---------+---------------+---------+-----------+----------+--------------+ SFJ      Full                                                        +---------+---------------+---------+-----------+----------+--------------+ FV Prox  Full                                                        +---------+---------------+---------+-----------+----------+--------------+ FV Mid   Full                                                        +---------+---------------+---------+-----------+----------+--------------+ FV DistalFull                                                        +---------+---------------+---------+-----------+----------+--------------+  PFV      Full                                                        +---------+---------------+---------+-----------+----------+--------------+ POP      None           No       No                   Acute          +---------+---------------+---------+-----------+----------+--------------+ PTV      Partial                                      Acute          +---------+---------------+---------+-----------+----------+--------------+ PERO     Partial                                      Acute          +---------+---------------+---------+-----------+----------+--------------+ Gastroc  Partial                                      Acute          +---------+---------------+---------+-----------+----------+--------------+     Summary: RIGHT: - There is no evidence of deep vein thrombosis in the lower extremity.  - No cystic structure found in the popliteal fossa. - Ultrasound characteristics of enlarged lymph nodes are noted in the groin.  LEFT: - Findings consistent with acute deep vein thrombosis  involving the left popliteal vein, left posterior tibial veins, left peroneal veins, and left gastrocnemius veins. - No cystic structure found in the popliteal fossa.  *See table(s) above for measurements and observations. Electronically signed by Deitra Mayo MD on 11/09/2019 at 3:11:22 PM.    Final     ASSESSMENT AND PLAN: 1.  Acute respiratory failure and sepsis 2.  Non-Hodgkin lymphoma, small lymphocytic lymphoma/CLL 3.  Pancytopenia secondary to underlying malignancy and recent chemotherapy 4.  Bilateral pleural effusions 5.  Left lower extremity DVT  -He is currently on IV antibiotics.  Continues to have intermittent fevers.  Continue to drain bilateral Pleurx catheters on alternating days. -acalabrutinib currently on hold.  Continue to hold for now.  Will discuss with primary medical oncologist about when it would be appropriate to resume. -WBC and platelets are overall stable.  Hemoglobin has improved following 2 units PRBCs.  He does not have any active bleeding.  Continue to monitor CBC with differential daily.  Transfuse per ICU parameters.    LOS: 1 day   Mikey Bussing, DNP, AGPCNP-BC, AOCNP 11/22/19

## 2019-11-22 NOTE — Telephone Encounter (Signed)
Called and left msg about upcoming appts. Mailed printout  

## 2019-11-22 NOTE — Progress Notes (Signed)
Pharmacy Note   When any of the following medications are ordered by a non-oncologist: 1. A pharmacist will review the inpatient medical record for: a. Abnormal Lab Values: . CBC . CrCl . LFTs b. Fever or other signs of infection c. Other pertinent data 2. If any of the medication-specific conditions in the checklist below are identified, the medication will be held until physician review occurs as described in the oral chemotherapy policy. 3. If patient meets criteria to proceed after checklist screening, before dispensing the medication the pharmacist will also: a. Review for potential drug interactions b. Review need for dosage adjustment for organ dysfunction  Per P and T committee recommendations, will hold acalabrutinib as pt is being treated for sepsis.   Royetta Asal, PharmD, BCPS 11/22/2019 12:16 PM

## 2019-11-22 NOTE — Progress Notes (Addendum)
NAME:  Isaac Pierce, MRN:  [id number], DOB:  October 08, 1960, LOS: 1 ADMISSION DATE:  11/17/2019, CONSULTATION DATE: 11/23/2019 REFERRING MD: Dr. 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 During that admission Isaac Pierce had bilateral recurrent effusions status post Pleurx catheters (placed on 2/9) and new diagnosis of DVT (discharged with eliquis)  Admitted back progressive dyspnea for the past 4 days.  Denies any cough, sputum production.  Isaac Pierce is febrile in ED 100.4 with lactic acidosis and hemoglobin of 6.6.  Isaac Pierce has been draining his catheter every other day- on the right side with 1 L drainage on 2/20 and left side was drained on 2/19 and  due to be drained again.  Past Medical History    has a past medical history of Anxiety, Back pain, and GERD (gastroesophageal reflux disease).  CLL, B-cell lymphoma, DVT, effusions, DVT  Isaac Pierce has significant hypersensitivity reaction to Rituxan,obinutuzumab, failed CHOP chemotherapy, currently on single agent therapy with acalabrutinib  Significant Hospital Events   2/21-admit  Consults:  PCCM  Procedures:    Significant Diagnostic Tests:  Chest x-ray 2/21-bilateral effusion, airspace disease  Micro Data:  Blood cultures 2/21 >>> Sputum culture > not sent  Urine culture 2/22 >>> RVP  2/21 > neg  Covid PCR 2/21 > neg MRSA 2/21 > neg   Antimicrobials:  Vanco 2/22 >>  Cefepime 2/22 >>   Interim history/subjective:  No events overnight. Received 2 units RBC 2/21. This AM states Isaac Pierce has a headache. Denies nausea. Feels overall weak and tired.    Scheduled Meds: . acalabrutinib  100 mg Oral BID  . Chlorhexidine Gluconate Cloth  6 each Topical Daily  . diltiazem  60 mg Oral Q8H  . famotidine  20 mg Oral Daily   Continuous Infusions: . sodium chloride    . albumin human    . ceFEPime (MAXIPIME) IV Stopped  (11/22/19 0629)  . magnesium sulfate bolus IVPB    . vancomycin Stopped (11/22/19 0546)   PRN Meds:.HYDROcodone-acetaminophen, hydrOXYzine, traMADol   Objective   Blood pressure (!) 102/53, pulse (!) 122, temperature 99.3 F (37.4 C), temperature source Oral, resp. rate (!) 24, height 5\' 11"  (1.803 m), weight 60.7 kg, SpO2 93 %.        Intake/Output Summary (Last 24 hours) at 11/22/2019 1158 Last data filed at 11/22/2019 0846 Gross per 24 hour  Intake 7621.81 ml  Output 1350 ml  Net 6271.81 ml   Filed Weights   11/05/2019 1348 11/04/2019 1943 11/22/19 0449  Weight: 58.2 kg 60.7 kg 60.7 kg    Examination: Gen:      Chronically ill appearing adult male  HEENT:  Dry MM  Lungs:    Coarse breath sounds, no wheeze/crackles, moderate tachypnea and use of accessory muscles   CV:         Tachy, Irregular, no MRG Abd:      + bowel sounds; soft, non-tender; no palpable masses, no distension Ext:    No edema; adequate peripheral perfusion Skin:      Warm and dry; no rash Neuro: Awake, alert, follows commands   Resolved Hospital Problem list     Assessment & Plan:   Acute respiratory failure, sepsis Possible HCAP given lactic acidosis, bilateral infiltrates chest x-ray Plan Continue Vanco, cefepime Check procalcitonin.  Drain left and right Pleurx catheter on alternating days   Acute anemia, thrombocytopenia Lab Results  Component Value Date   PLT 53 (L) 11/22/2019   PLT 55 (L) 11/06/2019   PLT 86 (L) 11/10/2019   PLT 87 (L) 10/28/2019   PLT 48 (L) 10/21/2019   PLT 66 (L) 10/15/2019    No evidence of active bleed.  May be related to CLL S/p 2 units RBC 2/21  Plan  Trend CBC Transfuse for hemoglobin <7  -PLTS 53 this AM. Down-trending. Send DIC panel   Left lower extremity DVT Plan  -Continue to hold due to above   Lactic Acidosis in setting of hypoperfusion in shock state, suspect hypovolemia. Patient reports decreased oral intake over last week due to feeling bad    Hyponatremia  Hypomagnesemia  Plan -Trend BMP -Replacing now  -D/C LR and Add NS -Albumin x 3 doses   CLL/lymphoma Plan  Hold outpatient therapy for now  Sinus tachycardia Plan  -Restart home Cardizem   Code status Full Code   Best practice:  Diet: Regular  DVT prophylaxis: hold  GI prophylaxis: Pepcid Glucose control: Montior Mobility: Bed Code Status: Full Family Communication: Patient updated Disposition: ICU  Labs   CBC: Recent Labs  Lab 11/01/2019 1356 11/22/19 0447  WBC 3.0* 2.5*  NEUTROABS 1.5*  --   HGB 6.6* 7.6*  HCT 20.9* 23.6*  MCV 89.7 86.1  PLT 55* 53*    Basic Metabolic Panel: Recent Labs  Lab 11/08/2019 1356 11/22/19 0447  NA 129* 126*  K 4.5 3.9  CL 98 98  CO2 20* 20*  GLUCOSE 111* 84  BUN 31* 29*  CREATININE 0.78 0.52*  CALCIUM 7.5* 7.4*  MG  --  1.2*  PHOS  --  2.8   GFR: Estimated Creatinine Clearance: 88.5 mL/min (A) (by C-G formula based on SCr of 0.52 mg/dL (L)). Recent Labs  Lab 11/09/2019 1356 11/19/2019 1556 11/14/2019 2000 11/22/19 0447  WBC 3.0*  --   --  2.5*  LATICACIDVEN 4.4* 3.7* 1.4  --     Liver Function Tests: Recent Labs  Lab 11/24/2019 1356  AST 29  ALT 18  ALKPHOS 384*  BILITOT 0.4  PROT 4.1*  ALBUMIN 1.6*   No results for input(s): LIPASE, AMYLASE in the last 168 hours. No results for input(s): AMMONIA in the last 168 hours.  ABG    Component Value Date/Time   PHART 7.404 10/29/2019 2140   PCO2ART 42.5 10/29/2019 2140   PO2ART 59.5 (L) 10/29/2019 2140   HCO3 26.0 10/29/2019 2140   ACIDBASEDEF 1.4 10/29/2019 1404   O2SAT 87.4 10/29/2019 2140     Coagulation Profile: Recent Labs  Lab 11/20/2019 1356  INR 2.3*    Cardiac Enzymes: No results for input(s): CKTOTAL, CKMB, CKMBINDEX, TROPONINI in the last 168 hours.  HbA1C: Hgb A1c MFr Bld  Date/Time Value Ref Range Status  10/30/2019 10:00 AM 5.7 (H) 4.8 - 5.6 % Final    Comment:    (NOTE) Pre diabetes:          5.7%-6.4% Diabetes:               >6.4% Glycemic control for   <7.0% adults with diabetes     CBG: No results for input(s): GLUCAP in the last 168 hours.  Past Medical History  Isaac Pierce,  has a past medical history of Anxiety, Back pain, and GERD (gastroesophageal reflux disease).   Surgical History    Past Surgical History:  Procedure Laterality Date  . IR IMAGING GUIDED PORT INSERTION  08/30/2019     Social History  reports that Isaac Pierce has been smoking cigarettes. Isaac Pierce has never used smokeless tobacco. Isaac Pierce reports previous alcohol use. Isaac Pierce reports previous drug use.   Family History   His family history includes Lymphoma in his father and mother.   Allergies Allergies  Allergen Reactions  . Obinutuzumab Shortness Of Breath    Reaction on 08/28/2020  . Rituximab Rash    Had rash, anxiety, HTN & tachycardia ~75min into 1st infusion 08/27/19     Home Medications  Prior to Admission medications   Medication Sig Start Date End Date Taking? Authorizing Provider  acalabrutinib (CALQUENCE) 100 MG capsule Take 1 capsule (100 mg total) by mouth 2 (two) times daily. 10/15/19  Yes Curt Bears, MD  albuterol (VENTOLIN HFA) 108 (90 Base) MCG/ACT inhaler Inhale 2 puffs into the lungs every 6 (six) hours as needed for wheezing or shortness of breath. 09/06/19 11/15/2019 Yes Shelly Coss, MD  allopurinol (ZYLOPRIM) 300 MG tablet Take 1 tablet (300 mg total) by mouth daily. 10/05/19  Yes Curt Bears, MD  apixaban (ELIQUIS) 5 MG TABS tablet Take 1 tablet (5 mg total) by mouth 2 (two) times daily. 11/16/19  Yes Omar Person, NP  diltiazem (CARDIZEM) 60 MG tablet Take 1 tablet (60 mg total) by mouth every 8 (eight) hours. 11/12/19  Yes Omar Person, NP  folic acid (FOLVITE) 1 MG tablet Take 1 tablet by mouth once daily 10/14/19  Yes Curt Bears, MD  furosemide (LASIX) 20 MG tablet Take 2 tablets (40 mg total) by mouth daily. 11/12/19  Yes Omar Person, NP  HYDROcodone-acetaminophen (NORCO) 5-325 MG  tablet 1/2 to 1 tab PO q 6 hours prn pain 11/12/19  Yes Ennever, Rudell Cobb, MD  hydrOXYzine (ATARAX/VISTARIL) 10 MG tablet Take 1 tablet (10 mg total) by mouth every 8 (eight) hours as needed. 10/05/19  Yes Curt Bears, MD  lidocaine-prilocaine (EMLA) cream Apply 1 application topically as needed. 10/28/19  Yes Tanner, Lyndon Code., PA-C  omeprazole (PRILOSEC) 20 MG capsule Take 1 capsule (20 mg total) by mouth daily. 10/05/19  Yes Curt Bears, MD  spironolactone (ALDACTONE) 50 MG tablet Take 1 tablet (50 mg total) by mouth 2 (two) times daily. 11/12/19  Yes Omar Person, NP  traMADol (ULTRAM) 50 MG tablet Take 1 tablet (50 mg total) by mouth every 6 (six) hours as needed for moderate pain. 11/12/19  Yes Ennever, Rudell Cobb, MD  apixaban (ELIQUIS) 5 MG TABS tablet Take 2 tablets (10 mg total) by mouth 2 (two) times daily for 4 days. Patient not taking: Reported on 11/02/2019 11/12/19 11/16/19  Omar Person, NP  hydrocortisone (ANUSOL-HC) 2.5 % rectal cream Place rectally 3 (three) times daily. Patient not taking: Reported on 11/04/2019 09/06/19   Shelly Coss, MD  predniSONE (DELTASONE) 20 MG tablet Please take 1 tablet (20 mg) daily for 7 days, followed by 1/2 tab daily for 7 days Patient not taking: Reported on 11/20/2019 11/12/19   Omar Person, NP     Hayden Pedro, AGACNP-BC [place]  Pgr: [phone]  PCCM Pgr: [phone]  I, Dr. Shellee Milo, have personally reviewed patient's available data, including medical history, events of note, physical examination and test results as part of my evaluation. I have discussed with NP Eubanks  and other care providers all aspects of the patients PCCM issues as listed.  In addition,  I have personally evaluated the patient and assisted in the formulation of the management plan.   Isaac Pierce is chronically very  ill but no PCCM issues outstanding and plan to tx to [facility] with PCCM f/u prn    Christinia Gully, MD Pulmonary  and [place] [phone] After 5:30 PM or weekends, use Beeper [phone]

## 2019-11-23 ENCOUNTER — Inpatient Hospital Stay: Payer: Medicaid Other | Admitting: Pulmonary Disease

## 2019-11-23 LAB — CBC
HCT: 23.6 % — ABNORMAL LOW (ref 39.0–52.0)
Hemoglobin: 7.5 g/dL — ABNORMAL LOW (ref 13.0–17.0)
MCH: 27.8 pg (ref 26.0–34.0)
MCHC: 31.8 g/dL (ref 30.0–36.0)
MCV: 87.4 fL (ref 80.0–100.0)
Platelets: 44 10*3/uL — ABNORMAL LOW (ref 150–400)
RBC: 2.7 MIL/uL — ABNORMAL LOW (ref 4.22–5.81)
RDW: 18.2 % — ABNORMAL HIGH (ref 11.5–15.5)
WBC: 2.5 10*3/uL — ABNORMAL LOW (ref 4.0–10.5)
nRBC: 0 % (ref 0.0–0.2)

## 2019-11-23 LAB — BASIC METABOLIC PANEL
Anion gap: 9 (ref 5–15)
BUN: 18 mg/dL (ref 6–20)
CO2: 17 mmol/L — ABNORMAL LOW (ref 22–32)
Calcium: 7.5 mg/dL — ABNORMAL LOW (ref 8.9–10.3)
Chloride: 98 mmol/L (ref 98–111)
Creatinine, Ser: 0.74 mg/dL (ref 0.61–1.24)
GFR calc Af Amer: >60 mL/min (ref >60)
GFR calc non Af Amer: >60 mL/min (ref >60)
Glucose, Bld: 98 mg/dL (ref 70–99)
Potassium: 4.1 mmol/L (ref 3.5–5.1)
Sodium: 124 mmol/L — ABNORMAL LOW (ref 135–145)

## 2019-11-23 LAB — LEGIONELLA PNEUMOPHILA SEROGP 1 UR AG: L. pneumophila Serogp 1 Ur Ag: NEGATIVE

## 2019-11-23 LAB — URINE CULTURE: Culture: NO GROWTH

## 2019-11-23 LAB — CORTISOL: Cortisol, Plasma: 23 ug/dL

## 2019-11-23 LAB — TSH: TSH: 3.455 u[IU]/mL (ref 0.350–4.500)

## 2019-11-23 LAB — PHOSPHORUS: Phosphorus: 2.2 mg/dL — ABNORMAL LOW (ref 2.5–4.6)

## 2019-11-23 LAB — PROCALCITONIN: Procalcitonin: 5.56 ng/mL

## 2019-11-23 LAB — OSMOLALITY, URINE: Osmolality, Ur: 575 mOsm/kg (ref 300–900)

## 2019-11-23 LAB — SODIUM, URINE, RANDOM: Sodium, Ur: 56 mmol/L

## 2019-11-23 LAB — MAGNESIUM: Magnesium: 1.4 mg/dL — ABNORMAL LOW (ref 1.7–2.4)

## 2019-11-23 MED ORDER — SODIUM CHLORIDE 0.9 % IV BOLUS
1000.0000 mL | Freq: Once | INTRAVENOUS | Status: AC
  Administered 2019-11-23: 1000 mL via INTRAVENOUS

## 2019-11-23 MED ORDER — VANCOMYCIN HCL 750 MG/150ML IV SOLN
750.0000 mg | Freq: Two times a day (BID) | INTRAVENOUS | Status: DC
  Administered 2019-11-23 – 2019-11-25 (×6): 750 mg via INTRAVENOUS
  Filled 2019-11-23 (×7): qty 150

## 2019-11-23 MED ORDER — PANTOPRAZOLE SODIUM 40 MG PO TBEC
40.0000 mg | DELAYED_RELEASE_TABLET | Freq: Every day | ORAL | Status: DC
  Administered 2019-11-23: 40 mg via ORAL
  Filled 2019-11-23: qty 1

## 2019-11-23 NOTE — Progress Notes (Signed)
Pharmacy Antibiotic Note  Isaac Pierce is a 59 y.o. male admitted on 11/28/2019 with bacteremia.  Pharmacy has been consulted for vancomycin dosing.  Plan: Vancomycin 750mg  iv q12hr  (same as prior dosing, last dose 2/22 at ~04520  Height: 5\' 11"  (180.3 cm) Weight: 135 lb 2.3 oz (61.3 kg) IBW/kg (Calculated) : 75.3  Temp (24hrs), Avg:99.8 F (37.7 C), Min:97.6 F (36.4 C), Max:102.7 F (39.3 C)  Recent Labs  Lab 11/02/2019 1356 11/24/2019 1556 11/18/2019 2000 11/22/19 0447 11/23/19 0339  WBC 3.0*  --   --  2.5* 2.5*  CREATININE 0.78  --   --  0.52* 0.74  LATICACIDVEN 4.4* 3.7* 1.4  --   --     Estimated Creatinine Clearance: 89.4 mL/min (by C-G formula based on SCr of 0.74 mg/dL).    Allergies  Allergen Reactions  . Obinutuzumab Shortness Of Breath    Reaction on 08/28/2020  . Rituximab Rash    Had rash, anxiety, HTN & tachycardia ~89min into 1st infusion 08/27/19    Antimicrobials this admission: 2/21 vanc >>2/22 2/23 >> 2/21 cefepime >> 2/21 flagyl x 1   Dose adjustments this admission: -  Microbiology results: 2/21 bcx x2: GPC in clusters 2/21 UCx:  2/21 MRSA PCR: neg 2/22 ur legionella:  2/21 ur strep pneum: neg  Thank you for allowing pharmacy to be a part of this patient's care.  Nani Skillern Crowford 11/23/2019 6:08 AM

## 2019-11-23 NOTE — Progress Notes (Signed)
[facility] Progress Note Patient Name: Isaac Pierce DOB: [birth year] MRN: [id number]   Date of Service  11/23/2019  HPI/Events of Note  Hypotension - BP = 86/42 with MAP = 52 No CVL or CVP. LVEF = 60-65%.   eICU Interventions  Will order: 1. Bolus with 0.9 NaCl  1 Liter IV over 1 hour now.      Intervention Category Major Interventions: Hypotension - evaluation and management  Emmagene Ortner Eugene 11/23/2019, 11:09 PM

## 2019-11-23 NOTE — Progress Notes (Signed)
[facility] Progress Note Patient Name: Keyontay Wambach DOB: [birth year] MRN: [id number]   Date of Service  11/23/2019  HPI/Events of Note  RN called in with requests for reflux rx.  Reports fever 101.4. Admitted with Fever. BC pending. On empiric abx.  Maxipime/Vanc .  BP is ok. No pressors  Tylenol was given. Takes oral meds.    eICU Interventions  1. Restart Home PPI in am .  2. Follow BC . Continue IV abx. Tylenol As needed  Fever.      Intervention Category Intermediate Interventions: Other:  Brenon Antosh 11/23/2019, 8:53 PM

## 2019-11-23 NOTE — Progress Notes (Addendum)
NAME:  Isaac Pierce, MRN:  [id number], DOB:  07-27-1961, LOS: 2 ADMISSION DATE:  11/04/2019, CONSULTATION DATE: 11/14/2019 REFERRING MD: Dr. 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 During that admission he had bilateral recurrent effusions rx  bilateral Pleurx catheters (placed on 2/9) and new diagnosis of DVT (discharged with eliquis)  Admitted back progressive dyspnea x4 days PTA and febrile in ED 100.4 with lactic acidosis and hemoglobin of 6.6.  He had been draining his catheter every other day- on the right side with 1 L drainage on 2/20 and left side was drained on 2/19 and  due to be drained again.  Past Medical History    has a past medical history of Anxiety, Back pain, and GERD (gastroesophageal reflux disease).  CLL, B-cell lymphoma, DVT, effusions, DVT  He has significant hypersensitivity reaction to Rituxan,obinutuzumab, failed CHOP chemotherapy, currently on single agent therapy with acalabrutinib  Significant Hospital Events   2/21-admit  Consults:  PCCM  Procedures:  L pleurex drained 750 cc 2/22   Significant Diagnostic Tests:     Micro Data:  Blood cultures 2/21 > 1/2 pos gpc's>>> Sputum culture > not sent  Urine culture 2/22  neg RVP  2/21 > neg  Covid PCR 2/21 > neg MRSA 2/21 > neg   Antimicrobials:  Vanco 2/22 >>  Cefepime 2/22 >>   Interim history/subjective:  Denies cough/ sob/ cp    Scheduled Meds: . Chlorhexidine Gluconate Cloth  6 each Topical Daily  . diltiazem  60 mg Oral Q8H  . famotidine  20 mg Oral Daily   Continuous Infusions: . sodium chloride 75 mL/hr at 11/23/19 1005  . ceFEPime (MAXIPIME) IV Stopped (11/23/19 [id number])  . vancomycin Stopped (11/23/19 0734)   PRN Meds:.acetaminophen, HYDROcodone-acetaminophen, hydrOXYzine, traMADol   Objective   Blood pressure (!) 104/46, pulse (!) 106, temperature  97.8 F (36.6 C), temperature source Oral, resp. rate (!) 25, height 5\' 11"  (1.803 m), weight 61.3 kg, SpO2 91 %.        Intake/Output Summary (Last 24 hours) at 11/23/2019 1026 Last data filed at 11/23/2019 1005 Gross per 24 hour  Intake 1878.11 ml  Output 580 ml  Net 1298.11 ml   Filed Weights   11/06/2019 1943 11/22/19 0449 11/23/19 0500  Weight: 60.7 kg 60.7 kg 61.3 kg    Examination: Pt alert, approp nad @ 45 degrees hob/ Not on  02  No jvd Oropharynx clear,  mucosa nl/ dentition poor  Neck supple Lungs clear  Bilaterally to  A and P  RRR no s3 or or sign murmur Abd soft with nl excursion / po intake ok Extr warm with no edema or clubbing noted Neuro  Sensorium intact,  no apparent motor deficits    Resolved Hospital Problem list     Assessment & Plan:   Acute respiratory failure, sepsis Possible HCAP given lactic acidosis, bilateral infiltrates chest x-ray - note elevated PCT 2/22 > trend  Plan Continue Vanco, cefepime and check pos BC w/a (likely though s epi)  Serial PCT    Acute anemia, thrombocytopenia Lab Results  Component Value Date   PLT 44 (L) 11/23/2019   PLT 52 (L) 11/22/2019   PLT 53 (L) 11/22/2019   PLT 87 (L) 10/28/2019   PLT 48 (L) 10/21/2019   PLT 66 (L) 10/15/2019      Lab Results  Component Value Date  HGB 7.5 (L) 11/23/2019   HGB 7.6 (L) 11/22/2019   HGB 6.6 (LL) 11/14/2019   HGB 7.1 (L) 10/28/2019   HGB 7.7 (L) 10/21/2019   HGB 8.6 (L) 10/15/2019    No evidence of active bleed.  May be related to CLL S/p 2 units RBC 2/21  Plan  Trend CBC Transfuse for hemoglobin <7  Heme/onc eval    Left lower extremity DVT Plan  -Continue to hold due to above   Lactic Acidosis in setting of hypoperfusion in shock state, suspect hypovolemia. Patient reports decreased oral intake over last week due to feeling bad  Hyponatremia  Hypomagnesemia  - Na continuing to trend down on NS as of 2/23  Plan - check urine na, osm 2/23 for [place] ?  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5*  --  2.5*  NEUTROABS 1.5*  --   --   --   HGB 6.6* 7.6*  --  7.5*  HCT 20.9* 23.6*  --  23.6*  MCV 89.7 86.1  --  87.4  PLT 55* 53* 52* 44*    Basic Metabolic Panel: Recent Labs  Lab 11/12/2019 1356 11/22/19 0447 11/23/19 0339  NA 129* 126* 124*  K 4.5 3.9 4.1  CL 98 98 98  CO2 20* 20* 17*  GLUCOSE 111* 84 98  BUN 31* 29* 18  CREATININE 0.78 0.52* 0.74  CALCIUM 7.5* 7.4* 7.5*  MG  --  1.2* 1.4*  PHOS  --  2.8 2.2*   GFR: Estimated Creatinine Clearance: 89.4 mL/min (by C-G formula based on SCr of 0.74 mg/dL). Recent Labs  Lab 11/13/2019 1356 11/25/2019 1556 11/23/2019 2000 11/22/19 0447 11/22/19 1211 11/23/19 0339  PROCALCITON  --   --   --   --  5.29  --   WBC 3.0*  --   --  2.5*  --  2.5*  LATICACIDVEN 4.4* 3.7* 1.4  --   --   --     Liver Function Tests: Recent Labs  Lab 11/17/2019 1356  AST 29  ALT 18  ALKPHOS 384*  BILITOT 0.4  PROT 4.1*  ALBUMIN 1.6*   No results for input(s): LIPASE, AMYLASE in the last 168 hours. No results for input(s): AMMONIA in the last 168 hours.  ABG    Component Value Date/Time    PHART 7.404 10/29/2019 2140   PCO2ART 42.5 10/29/2019 2140   PO2ART 59.5 (L) 10/29/2019 2140   HCO3 26.0 10/29/2019 2140   ACIDBASEDEF 1.4 10/29/2019 1404   O2SAT 87.4 10/29/2019 2140     Coagulation Profile: Recent Labs  Lab 11/28/2019 1356 11/22/19 1211  INR 2.3* 1.7*    Cardiac  Enzymes: No results for input(s): CKTOTAL, CKMB, CKMBINDEX, TROPONINI in the last 168 hours.  HbA1C: Hgb A1c MFr Bld  Date/Time Value Ref Range Status  10/30/2019 10:00 AM 5.7 (H) 4.8 - 5.6 % Final    Comment:    (NOTE) Pre diabetes:          5.7%-6.4% Diabetes:              >6.4% Glycemic control for   <7.0% adults with diabetes     CBG: No results for input(s): GLUCAP in the last 168 hours.    The patient is critically ill with multiple organ systems failure and requires high complexity decision making for assessment and support, frequent evaluation and titration of therapies, application of advanced monitoring technologies and extensive interpretation of multiple databases. Critical Care Time devoted to patient care services described in this note is 45 minutes.    Christinia Gully, MD Pulmonary and [place] [phone] After 5:30 PM or weekends, use Beeper [phone]

## 2019-11-23 NOTE — Progress Notes (Signed)
HEMATOLOGY-ONCOLOGY PROGRESS NOTE  SUBJECTIVE: Reports improvement in his breathing today.  Still has some intermittent right-sided chest pain.  He reports that his legs are extremely weak and he cannot really walk very much.  He reported falling at home.  He also stated that he was not eating for about 4 to 5 days prior to admission.  His appetite is starting to improve.  No current nausea or vomiting.  Denies bleeding.  He has no other complaints today.  Oncology History  Non-Hodgkin lymphoma ([place])  08/25/2019 Initial Diagnosis   Non-Hodgkin lymphoma ([place])   08/25/2019 - 08/31/2019 Chemotherapy   The patient had riTUXimab (RITUXAN) 700 mg in sodium chloride 0.9 % 250 mL (2.1875 mg/mL) infusion, 375 mg/m2 = 700 mg (100 % of original dose 375 mg/m2), Intravenous,  Once, 1 of 1 cycle Dose modification: 375 mg/m2 (original dose 375 mg/m2, Cycle 1) Administration: 700 mg (08/25/2019) riTUXimab-pvvr (RUXIENCE) 700 mg in sodium chloride 0.9 % 250 mL (2.1875 mg/mL) infusion, 375 mg/m2 = 700 mg (100 % of original dose 375 mg/m2), Intravenous,  Once, 0 of 3 cycles Dose modification: 375 mg/m2 (original dose 375 mg/m2, Cycle 2), 375 mg/m2 (original dose 375 mg/m2, Cycle 3)  for chemotherapy treatment.    09/01/2019 - 10/12/2019 Chemotherapy   The patient had DOXOrubicin (ADRIAMYCIN) chemo injection 68 mg, 37.5 mg/m2 = 68 mg (75 % of original dose 50 mg/m2), Intravenous,  Once, 2 of 6 cycles Dose modification: 37.5 mg/m2 (75 % of original dose 50 mg/m2, Cycle 1, Reason: Provider Judgment) Administration: 68 mg (09/01/2019), 68 mg (09/22/2019) palonosetron (ALOXI) injection 0.25 mg, 0.25 mg, Intravenous,  Once, 2 of 6 cycles Administration: 0.25 mg (09/01/2019), 0.25 mg (09/22/2019) pegfilgrastim-cbqv (UDENYCA) injection 6 mg, 6 mg, Subcutaneous, Once, 1 of 5 cycles Administration: 6 mg (09/23/2019) vinCRIStine (ONCOVIN) 1.5 mg in sodium chloride 0.9 % 50 mL chemo infusion, 1.5 mg (75 % of original dose 2  mg), Intravenous,  Once, 2 of 6 cycles Dose modification: 1.5 mg (75 % of original dose 2 mg, Cycle 1, Reason: Provider Judgment) Administration: 1.5 mg (09/01/2019), 1.5 mg (09/22/2019) riTUXimab (RITUXAN) 700 mg in sodium chloride 0.9 % 250 mL (2.1875 mg/mL) infusion, 375 mg/m2 = 700 mg (100 % of original dose 375 mg/m2), Intravenous,  Once, 1 of 1 cycle Dose modification: 375 mg/m2 (original dose 375 mg/m2, Cycle 1) Administration: 700 mg (09/01/2019) cyclophosphamide (CYTOXAN) 1,020 mg in sodium chloride 0.9 % 250 mL chemo infusion, 562.5 mg/m2 = 1,020 mg (75 % of original dose 750 mg/m2), Intravenous,  Once, 2 of 6 cycles Dose modification: 562.5 mg/m2 (75 % of original dose 750 mg/m2, Cycle 1, Reason: Provider Judgment) Administration: 1,020 mg (09/01/2019), 1,020 mg (09/22/2019) fosaprepitant (EMEND) 150 mg, dexamethasone (DECADRON) 12 mg in sodium chloride 0.9 % 145 mL IVPB, , Intravenous,  Once, 2 of 6 cycles Administration:  (09/01/2019),  (09/22/2019) riTUXimab-pvvr (RUXIENCE) 700 mg in sodium chloride 0.9 % 250 mL (2.1875 mg/mL) infusion, 375 mg/m2 = 700 mg, Intravenous,  Once, 1 of 1 cycle  for chemotherapy treatment.    10/22/2019 - 10/29/2019 Chemotherapy   The patient had obinutuzumab (GAZYVA) 100 mg in sodium chloride 0.9 % 100 mL (0.9615 mg/mL) chemo infusion, 100 mg, Intravenous, Once, 1 of 6 cycles Administration: 100 mg (10/22/2019), 1,000 mg (10/29/2019)  for chemotherapy treatment.       REVIEW OF SYSTEMS:   The patient is having intermittent fevers.  Shortness of breath improved. No bleeding noted.  Reports weakness in his lower  extremities.  The remainder of the review of systems noncontributory.  I have reviewed the past medical history, past surgical history, social history and family history with the patient and they are unchanged from previous note.   PHYSICAL EXAMINATION: ECOG PERFORMANCE STATUS: 2 - Symptomatic, <50% confined to bed  Vitals:   11/23/19 1300  11/23/19 1400  BP: (!) 105/55 (!) 102/44  Pulse: (!) 117 (!) 123  Resp: (!) 31 (!) 31  Temp:    SpO2: 93% 94%   Filed Weights   11/24/2019 1943 11/22/19 0449 11/23/19 0500  Weight: 133 lb 13.1 oz (60.7 kg) 133 lb 13.1 oz (60.7 kg) 135 lb 2.3 oz (61.3 kg)    Intake/Output from previous day: 02/22 0701 - 02/23 0700 In: 1843.6 [I.V.:1327.4; IV Piggyback:516.2] Out: 830 [Urine:830]  GENERAL: Chronically ill-appearing male, no distress OROPHARYNX: No thrush or mucositis LUNGS: Clear bilaterally, bilateral Pleurx catheters in place HEART: Tachycardic, pitting edema to the bilateral lower extremities ABDOMEN:abdomen soft, non-tender and normal bowel sounds Musculoskeletal:no cyanosis of digits and no clubbing  NEURO: alert & oriented x 3 with fluent speech, no focal motor/sensory deficits  LABORATORY DATA:  I have reviewed the data as listed CMP Latest Ref Rng & Units 11/23/2019 11/22/2019 11/06/2019  Glucose 70 - 99 mg/dL 98 84 111(H)  BUN 6 - 20 mg/dL 18 29(H) 31(H)  Creatinine 0.61 - 1.24 mg/dL 0.74 0.52(L) 0.78  Sodium 135 - 145 mmol/L 124(L) 126(L) 129(L)  Potassium 3.5 - 5.1 mmol/L 4.1 3.9 4.5  Chloride 98 - 111 mmol/L 98 98 98  CO2 22 - 32 mmol/L 17(L) 20(L) 20(L)  Calcium 8.9 - 10.3 mg/dL 7.5(L) 7.4(L) 7.5(L)  Total Protein 6.5 - 8.1 g/dL - - 4.1(L)  Total Bilirubin 0.3 - 1.2 mg/dL - - 0.4  Alkaline Phos 38 - 126 U/L - - 384(H)  AST 15 - 41 U/L - - 29  ALT 0 - 44 U/L - - 18    Lab Results  Component Value Date   WBC 2.5 (L) 11/23/2019   HGB 7.5 (L) 11/23/2019   HCT 23.6 (L) 11/23/2019   MCV 87.4 11/23/2019   PLT 44 (L) 11/23/2019   NEUTROABS 1.5 (L) 11/16/2019    CT CHEST WO CONTRAST  Result Date: 10/29/2019 CLINICAL DATA:  With lymphoma, abnormal radiograph EXAM: CT CHEST WITHOUT CONTRAST TECHNIQUE: Multidetector CT imaging of the chest was performed following the standard protocol without IV contrast. COMPARISON:  Radiograph 10/28/2018, CT 08/25/2019 FINDINGS:  Cardiovascular: Left IJ approach Port-A-Cath is accessed with the tip positioned at the right atrium. Normal cardiac size. Redemonstration of the extensive pericardial soft tissue attenuation lesions these appear to extend more superiorly towards the cardiac base than on comparison study including a larger crescentic amount of soft tissue measuring approximately 6.9 cm anteroposterior and 1.7 cm in thickness near the level of the pulmonary trunk (2/79). Central pulmonary arteries are normal caliber. Atherosclerotic plaque within the normal caliber aorta. Normal 3 vessel branching of the aortic arch with minimal plaque in the great vessels. Mediastinum/Nodes: Decrease in the size of bulky thoracic lymphadenopathy compatible with patient's known lymphoproliferative disorder. Index lymph nodes include: *A high right paratracheal node measuring 12 mm short axis (2/45), previously 23 mm *AP window lymph node measuring 24 mm (4/35), previously 29 mm *Right hilar node measuring 15 mm (2/85), previously 21 mm *Subcarinal node measuring 18 mm (2/69), previously 26 mm *Right inter lobar lymph node on comparison is difficult to assess given absence of contrast media. Thyroid  gland is unremarkable. No acute abnormality of the trachea or esophagus. Lungs/Pleura: Large bilateral pleural effusions are noted. Dense bandlike areas of atelectasis are noted in both lungs. Multiple nodules are seen in the right middle and lower lobe examples include a 8 mm nodule laterally (4/102) in lower lobe and a subpleural 6 mm nodule anteriorly in the right middle lobe (4/98). No visible nodules seen in the left lung though underlying atelectasis could obscure the presence of nodules or masses. Upper Abdomen: Marked splenomegaly. Some wedge-shaped hypoattenuation in the periphery of the spleen measuring 1.6 x 3.2 cm, compatible with sequela of remote splenic infarct. Indeterminate exophytic 12 mm lesion arising from the upper pole right kidney.  Musculoskeletal: Multilevel degenerative changes are present in the imaged portions of the spine. Evaluation of the chest wall limited given extensive motion artifact which may limit detection of sternal and rib fractures. No gross acute osseous abnormality or suspicious osseous lesions. IMPRESSION: 1. Mixed response of patient's lymphoproliferative disease with overall decrease in the size of bulky thoracic lymphadenopathy but with increasing pericardial soft tissue masses and multiple new lung nodules. 2. Large bilateral pleural effusions with associated compressive atelectasis. 3. 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 4. Indeterminate 12 mm lesion arising from the upper pole of the right kidney. Indeterminate on noncontrast CT. Could consider follow-up with nonemergent outpatient MR or CT. 5. Evaluation of the chest wall limited given extensive motion artifact which may limit detection of sternal and rib fractures. No gross acute osseous abnormality. 6.  Aortic Atherosclerosis (ICD10-I70.0). Electronically Signed   By: Lovena Le M.D.   On: 10/29/2019 20:36   DG Chest Port 1 View  Result Date: 11/22/2019 CLINICAL DATA:  Acute respiratory failure EXAM: PORTABLE CHEST 1 VIEW COMPARISON:  Radiograph 11/18/2019 FINDINGS: Right IJ approach Port-A-Cath tip terminates at the right atrium, currently accessed by [facility] needle. Bilateral pleural and mediastinal drains are in stable position. Telemetry leads overlie the chest. Small bilateral pleural effusions are again noted, not significantly changed from 1 day prior. A hazy basilar opacity likely reflecting combination of atelectasis and layering pleural fluid. Cardiomediastinal contours are stable. Underlying airspace disease is possible as well. No visible pneumothorax. No acute osseous or soft tissue abnormality. IMPRESSION: Stable appearance of the chest with small bilateral pleural effusions and bibasilar opacities likely reflecting  atelectasis and/or airspace disease. Electronically Signed   By: Lovena Le M.D.   On: 11/22/2019 04:59   DG Chest Port 1 View  Result Date: 11/03/2019 CLINICAL DATA:  Shortness of breath and fever EXAM: PORTABLE CHEST 1 VIEW COMPARISON:  Chest radiograph dated 11/09/2019 FINDINGS: A right internal jugular central venous port catheter tip overlies the cavoatrial junction. Bilateral pleural and a mediastinal drain appear unchanged in position compared to prior exam. The cardiac silhouette is unchanged. Small bilateral pleural effusions with associated atelectasis/airspace disease are not significantly changed. There is no pneumothorax. The osseous structures are intact. IMPRESSION: Small bilateral pleural effusions with associated atelectasis/airspace disease are not significantly changed compared to prior exam. Electronically Signed   By: Zerita Boers M.D.   On: 11/02/2019 14:49   DG CHEST PORT 1 VIEW  Result Date: 11/09/2019 CLINICAL DATA:  Pleural effusions. EXAM: PORTABLE CHEST 1 VIEW COMPARISON:  11/08/2019 FINDINGS: The right sided Port-A-Cath is stable. There are new bilateral PleurX drainage catheters. These appear to be in good position without complicating features. Slightly smaller pleural effusions. No pneumothorax. Stable cardiomediastinal silhouette. No pulmonary edema. IMPRESSION: New bilateral PleurX drainage catheters  in good position without complicating features. Slight interval decrease in size of pleural effusions and overlying atelectasis. Electronically Signed   By: Marijo Sanes M.D.   On: 11/09/2019 16:37   DG CHEST PORT 1 VIEW  Result Date: 11/08/2019 CLINICAL DATA:  History of prior effusions EXAM: PORTABLE CHEST 1 VIEW COMPARISON:  11/06/2019 FINDINGS: Cardiac shadow is stable. Right chest wall port is again seen. Slight increase in right-sided pleural effusion is noted. Stable left effusion is seen. Bibasilar atelectatic changes are noted. No bony abnormality is seen.  IMPRESSION: Slight increase in right-sided pleural effusion when compared with the prior exam. Electronically Signed   By: Inez Catalina M.D.   On: 11/08/2019 13:35   DG Chest Port 1 View  Result Date: 11/06/2019 CLINICAL DATA:  Status post right thoracentesis. EXAM: PORTABLE CHEST 1 VIEW COMPARISON:  November 05, 2019. FINDINGS: Stable cardiomediastinal silhouette. Right internal jugular Port-A-Cath is unchanged. No pneumothorax is noted. Right pleural effusion is smaller status post thoracentesis. Stable small left pleural effusion with associated atelectasis. Bony thorax is unremarkable. IMPRESSION: No pneumothorax status post right thoracentesis. Right pleural effusion is smaller. Stable small left pleural effusion with associated atelectasis. Electronically Signed   By: Marijo Conception M.D.   On: 11/06/2019 12:17   DG Chest Port 1 View  Result Date: 11/05/2019 CLINICAL DATA:  Status post left thoracentesis EXAM: PORTABLE CHEST 1 VIEW COMPARISON:  11/04/2019 FINDINGS: Slight interval reduction in volume of a left pleural effusion, now small, with associated atelectasis or consolidation. No pneumothorax. Unchanged moderate, layering right pleural effusion and associated atelectasis or consolidation. Right chest port catheter. IMPRESSION: Slight interval reduction in volume of a left pleural effusion, now small, with associated atelectasis or consolidation. No pneumothorax. Electronically Signed   By: Eddie Candle M.D.   On: 11/05/2019 19:06   DG CHEST PORT 1 VIEW  Result Date: 11/04/2019 CLINICAL DATA:  Shortness of breath EXAM: PORTABLE CHEST 1 VIEW COMPARISON:  11/04/2019 FINDINGS: Bilateral pleural effusions are again seen. Vascular congestion is noted. Right chest wall port is again noted. The overall appearance is similar to that seen on the prior exam. IMPRESSION: Stable bilateral pleural effusions and vascular congestion similar to that seen on the prior exam. Electronically Signed   By: Inez Catalina M.D.   On: 11/04/2019 23:52   DG Chest Port 1 View  Result Date: 11/04/2019 CLINICAL DATA:  Follow-up pleural effusions. EXAM: PORTABLE CHEST 1 VIEW COMPARISON:  Multiple previous chest films. The most recent is 11/01/2019 FINDINGS: The right IJ power port is stable. Persistent moderate-sized bilateral pleural effusions with significant overlying atelectasis. Central vascular congestion and possible mild perihilar interstitial edema. IMPRESSION: Persistent moderate-sized bilateral pleural effusions with significant overlying atelectasis. Electronically Signed   By: Marijo Sanes M.D.   On: 11/04/2019 06:14   DG CHEST PORT 1 VIEW  Result Date: 11/01/2019 CLINICAL DATA:  Status post thoracentesis EXAM: PORTABLE CHEST 1 VIEW COMPARISON:  10/31/19 FINDINGS: Cardiac shadow is stable. Right chest wall port is noted. Small pleural effusions are noted bilaterally with basilar atelectasis. No pneumothorax is seen. IMPRESSION: Small pleural effusions bilaterally with basilar atelectasis. No pneumothorax is identified. Electronically Signed   By: Inez Catalina M.D.   On: 11/01/2019 01:48   DG Chest Port 1 View  Result Date: 10/31/2019 CLINICAL DATA:  Status post left thoracentesis. EXAM: PORTABLE CHEST 1 VIEW COMPARISON:  October 31, 2019 FINDINGS: The left-sided pleural effusion is smaller after thoracentesis. No pneumothorax. Bilateral pleural effusions remain. The  right Port-A-Cath is stable. No other interval changes. IMPRESSION: The left-sided pleural effusion is smaller after thoracentesis. No pneumothorax. No other changes. Electronically Signed   By: Dorise Bullion III M.D   On: 10/31/2019 10:29   DG Chest Port 1 View  Result Date: 10/31/2019 CLINICAL DATA:  Follow-up pleural effusion EXAM: PORTABLE CHEST 1 VIEW COMPARISON:  10/30/2019 FINDINGS: Power port unchanged. Bilateral effusions, larger on the left than the right. Volume loss in the lower lungs, worse on the left than the right. No new  radiographic finding. IMPRESSION: No change. Large bilateral effusions left more than right with dependent volume loss left more than right. Electronically Signed   By: Nelson Chimes M.D.   On: 10/31/2019 06:00   DG Chest Port 1 View  Result Date: 10/30/2019 CLINICAL DATA:  Respiratory failure. EXAM: PORTABLE CHEST 1 VIEW COMPARISON:  Chest radiograph 10/29/2019 FINDINGS: Right anterior chest wall Port-A-Cath is present with tip projecting over the superior vena cava. Monitoring leads overlie the patient. Stable enlarged cardiac and mediastinal contours. Persistent moderate to large layering bilateral pleural effusions with underlying pulmonary consolidation. No pneumothorax. IMPRESSION: Persistent moderate to large layering bilateral effusions with underlying consolidation. Electronically Signed   By: Lovey Newcomer M.D.   On: 10/30/2019 04:58   DG Chest Port 1 View  Result Date: 10/29/2019 CLINICAL DATA:  Shortness of breath EXAM: PORTABLE CHEST 1 VIEW COMPARISON:  09/01/2019, CT 08/25/2019 FINDINGS: Right-sided central venous port tip over the cavoatrial region allowing for rotated patient. Suspected moderate layering right pleural effusion, probably increased from prior radiograph. Moderate left pleural effusion, also suspect increase in size. Enlarged cardiomediastinal silhouette with vascular congestion. Consolidation at the left lung base. Fluid in the right fissure. Hazy appearance of both thoraces likely due to layering fluid although underlying pulmonary edema could be contributing. No pneumothorax. IMPRESSION: Hazy appearance of the bilateral lungs since 09/01/2019, suspected to be secondary to moderate layering pleural effusions, probably increased in the interim. Cardiomegaly with vascular congestion; hazy lung appearance could also be due to associated edema. Atelectasis or pneumonia at the left base. Electronically Signed   By: Donavan Foil M.D.   On: 10/29/2019 15:19   VAS [place] LOWER EXTREMITY  VENOUS (DVT)  Result Date: 11/09/2019  Lower Venous DVTStudy Indications: Swelling.  Limitations: Poor ultrasound/tissue interface. Comparison Study: 08/18/2019 - Negative for DVT. Performing Technologist: Oliver Hum RVT  Examination Guidelines: A complete evaluation includes B-mode imaging, spectral Doppler, color Doppler, and power Doppler as needed of all accessible portions of each vessel. Bilateral testing is considered an integral part of a complete examination. Limited examinations for reoccurring indications may be performed as noted. The reflux portion of the exam is performed with the patient in reverse Trendelenburg.  +---------+---------------+---------+-----------+----------+--------------+ RIGHT    CompressibilityPhasicitySpontaneityPropertiesThrombus Aging +---------+---------------+---------+-----------+----------+--------------+ CFV      Full           Yes      Yes                                 +---------+---------------+---------+-----------+----------+--------------+ SFJ      Full                                                        +---------+---------------+---------+-----------+----------+--------------+ FV Prox  Full                                                        +---------+---------------+---------+-----------+----------+--------------+ FV Mid   Full                                                        +---------+---------------+---------+-----------+----------+--------------+ FV DistalFull                                                        +---------+---------------+---------+-----------+----------+--------------+ PFV      Full                                                        +---------+---------------+---------+-----------+----------+--------------+ POP      Full           Yes      Yes                                 +---------+---------------+---------+-----------+----------+--------------+ PTV       Full                                                        +---------+---------------+---------+-----------+----------+--------------+ PERO     Full                                                        +---------+---------------+---------+-----------+----------+--------------+   +---------+---------------+---------+-----------+----------+--------------+ LEFT     CompressibilityPhasicitySpontaneityPropertiesThrombus Aging +---------+---------------+---------+-----------+----------+--------------+ CFV      Full           Yes      Yes                                 +---------+---------------+---------+-----------+----------+--------------+ SFJ      Full                                                        +---------+---------------+---------+-----------+----------+--------------+ FV Prox  Full                                                        +---------+---------------+---------+-----------+----------+--------------+  FV Mid   Full                                                        +---------+---------------+---------+-----------+----------+--------------+ FV DistalFull                                                        +---------+---------------+---------+-----------+----------+--------------+ PFV      Full                                                        +---------+---------------+---------+-----------+----------+--------------+ POP      None           No       No                   Acute          +---------+---------------+---------+-----------+----------+--------------+ PTV      Partial                                      Acute          +---------+---------------+---------+-----------+----------+--------------+ PERO     Partial                                      Acute          +---------+---------------+---------+-----------+----------+--------------+ Gastroc  Partial                                       Acute          +---------+---------------+---------+-----------+----------+--------------+     Summary: RIGHT: - There is no evidence of deep vein thrombosis in the lower extremity.  - No cystic structure found in the popliteal fossa. - Ultrasound characteristics of enlarged lymph nodes are noted in the groin.  LEFT: - Findings consistent with acute deep vein thrombosis involving the left popliteal vein, left posterior tibial veins, left peroneal veins, and left gastrocnemius veins. - No cystic structure found in the popliteal fossa.  *See table(s) above for measurements and observations. Electronically signed by Deitra Mayo MD on 11/09/2019 at 3:11:22 PM.    Final     ASSESSMENT AND PLAN: Mr. 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  The patient received 2 cycles of CHOP with no improvement of his disease.  Additionally he had a significant hypersensitivity reaction to Rituxan on 2 occasions.  The patient was also tried on obinutuzumab and tolerated the first dose well but developed a hypersensitivity reaction and respiratory distress with the second cycle.  He was started on treatment with acalabrutinib which he tolerated fairly well.  He  has been off this medication since admission and states that he thinks he is nearly out of this medication because he dropped some pills on the floor.  Will defer to Dr. Earlie Server as to when to resume his acalabrutinib.   The patient's respiratory status seems to be improving.  Nursing held off on draining Pleurx catheter today due to low blood pressure.  Otherwise, they have been draining daily but alternating sides.  The patient has pancytopenia secondary to his underlying malignancy as well as recent chemotherapy.  His counts are overall stable.  Would recommend transfusion of PRBCs for hemoglobin less than 8 and  platelet count less than 20,000 or active bleeding.  He is currently off Eliquis which he takes for his left lower extremity DVT.  We will continue to hold this until platelet count is above 50,000.  The patient has significant deconditioning and lower extremity weakness.  We will request PT/OT consults.  The patient reports that he cannot really get around his house.  I briefly discussed with him considering going to a SNF for rehabilitation to gain back some of his strength.  He is unsure if he wishes to proceed with this.  I have encouraged him to think about this.    LOS: 2 days   Mikey Bussing, DNP, AGPCNP-BC, AOCNP 11/23/19

## 2019-11-24 ENCOUNTER — Inpatient Hospital Stay (HOSPITAL_COMMUNITY): Payer: Medicaid Other

## 2019-11-24 DIAGNOSIS — E871 Hypo-osmolality and hyponatremia: Secondary | ICD-10-CM

## 2019-11-24 DIAGNOSIS — J90 Pleural effusion, not elsewhere classified: Secondary | ICD-10-CM

## 2019-11-24 DIAGNOSIS — D630 Anemia in neoplastic disease: Secondary | ICD-10-CM

## 2019-11-24 LAB — BASIC METABOLIC PANEL
Anion gap: 11 (ref 5–15)
BUN: 19 mg/dL (ref 6–20)
CO2: 13 mmol/L — ABNORMAL LOW (ref 22–32)
Calcium: 7 mg/dL — ABNORMAL LOW (ref 8.9–10.3)
Chloride: 100 mmol/L (ref 98–111)
Creatinine, Ser: 0.79 mg/dL (ref 0.61–1.24)
GFR calc Af Amer: >60 mL/min (ref >60)
GFR calc non Af Amer: >60 mL/min (ref >60)
Glucose, Bld: 96 mg/dL (ref 70–99)
Potassium: 4.3 mmol/L (ref 3.5–5.1)
Sodium: 124 mmol/L — ABNORMAL LOW (ref 135–145)

## 2019-11-24 LAB — CBC
HCT: 25.3 % — ABNORMAL LOW (ref 39.0–52.0)
Hemoglobin: 8 g/dL — ABNORMAL LOW (ref 13.0–17.0)
MCH: 27.7 pg (ref 26.0–34.0)
MCHC: 31.6 g/dL (ref 30.0–36.0)
MCV: 87.5 fL (ref 80.0–100.0)
Platelets: 41 10*3/uL — ABNORMAL LOW (ref 150–400)
RBC: 2.89 MIL/uL — ABNORMAL LOW (ref 4.22–5.81)
RDW: 18.4 % — ABNORMAL HIGH (ref 11.5–15.5)
WBC: 1.8 10*3/uL — ABNORMAL LOW (ref 4.0–10.5)
nRBC: 0 % (ref 0.0–0.2)

## 2019-11-24 LAB — CULTURE, BLOOD (ROUTINE X 2): Special Requests: ADEQUATE

## 2019-11-24 LAB — PROTEIN, PLEURAL OR PERITONEAL FLUID: Total protein, fluid: <3 g/dL

## 2019-11-24 LAB — PHOSPHORUS: Phosphorus: 2.3 mg/dL — ABNORMAL LOW (ref 2.5–4.6)

## 2019-11-24 LAB — ALBUMIN: Albumin: 1.4 g/dL — ABNORMAL LOW (ref 3.5–5.0)

## 2019-11-24 LAB — LACTATE DEHYDROGENASE: LDH: 413 U/L — ABNORMAL HIGH (ref 98–192)

## 2019-11-24 LAB — LACTATE DEHYDROGENASE, PLEURAL OR PERITONEAL FLUID: LD, Fluid: 579 U/L — ABNORMAL HIGH (ref 3–23)

## 2019-11-24 LAB — PROCALCITONIN: Procalcitonin: 11.15 ng/mL

## 2019-11-24 LAB — MAGNESIUM: Magnesium: 1.5 mg/dL — ABNORMAL LOW (ref 1.7–2.4)

## 2019-11-24 MED ORDER — SODIUM CHLORIDE 0.9 % IV BOLUS
1000.0000 mL | Freq: Once | INTRAVENOUS | Status: AC
  Administered 2019-11-24: 1000 mL via INTRAVENOUS

## 2019-11-24 MED ORDER — SPIRONOLACTONE 12.5 MG HALF TABLET
12.5000 mg | ORAL_TABLET | Freq: Two times a day (BID) | ORAL | Status: DC
  Administered 2019-11-24: 12.5 mg via ORAL
  Filled 2019-11-24 (×3): qty 1

## 2019-11-24 MED ORDER — PANTOPRAZOLE SODIUM 40 MG PO TBEC
40.0000 mg | DELAYED_RELEASE_TABLET | Freq: Every day | ORAL | Status: DC
  Administered 2019-11-25: 40 mg via ORAL
  Filled 2019-11-24: qty 1

## 2019-11-24 MED ORDER — PHENYLEPHRINE CONCENTRATED 100MG/250ML (0.4 MG/ML) INFUSION SIMPLE
0.0000 ug/min | INTRAVENOUS | Status: DC
  Administered 2019-11-24: 20 ug/min via INTRAVENOUS
  Administered 2019-11-25: 300 ug/min via INTRAVENOUS
  Filled 2019-11-24 (×2): qty 250

## 2019-11-24 MED ORDER — MAGNESIUM SULFATE 2 GM/50ML IV SOLN: 2.0000 g | Freq: Once | INTRAVENOUS | Status: DC

## 2019-11-24 MED ORDER — SODIUM CHLORIDE 0.9% FLUSH: 10.0000 mL | INTRAVENOUS | Status: DC | PRN

## 2019-11-24 MED ORDER — SODIUM CHLORIDE 0.9% FLUSH
10.0000 mL | Freq: Two times a day (BID) | INTRAVENOUS | Status: DC
  Administered 2019-11-24 – 2019-11-25 (×3): 10 mL

## 2019-11-24 MED ORDER — SODIUM PHOSPHATES 45 MMOLE/15ML IV SOLN
10.0000 mmol | Freq: Once | INTRAVENOUS | Status: AC
  Administered 2019-11-24: 10 mmol via INTRAVENOUS
  Filled 2019-11-24: qty 3.33

## 2019-11-24 MED ORDER — PHENYLEPHRINE HCL-NACL 10-0.9 MG/250ML-% IV SOLN
25.0000 ug/min | INTRAVENOUS | Status: DC
  Administered 2019-11-24 (×2): 75 ug/min via INTRAVENOUS
  Administered 2019-11-24: 25 ug/min via INTRAVENOUS
  Administered 2019-11-24: 55 ug/min via INTRAVENOUS
  Filled 2019-11-24 (×6): qty 250

## 2019-11-24 MED ORDER — FUROSEMIDE 20 MG PO TABS
20.0000 mg | ORAL_TABLET | Freq: Every day | ORAL | Status: DC
  Administered 2019-11-24: 20 mg via ORAL
  Filled 2019-11-24: qty 1

## 2019-11-24 MED ORDER — SODIUM CHLORIDE 0.9 % IV SOLN: 250.0000 mL | INTRAVENOUS | Status: DC

## 2019-11-24 MED ORDER — MAGNESIUM SULFATE IN D5W 1-5 GM/100ML-% IV SOLN
1.0000 g | Freq: Once | INTRAVENOUS | Status: AC
  Administered 2019-11-24: 1 g via INTRAVENOUS
  Filled 2019-11-24: qty 100

## 2019-11-24 MED ORDER — FAMOTIDINE 20 MG PO TABS
20.0000 mg | ORAL_TABLET | Freq: Every day | ORAL | Status: DC
  Administered 2019-11-24: 20 mg via ORAL
  Filled 2019-11-24: qty 1

## 2019-11-24 NOTE — Progress Notes (Signed)
[place] Progress Note Patient Name: Isaac Pierce DOB: 1961/04/06 MRN: [id number]   Date of Service  11/24/2019  HPI/Events of Note  Hypotension - Patient now requiring a Phenylephrine IV infusion for hemodynamic support.   eICU Interventions  Will order: 1. Hold further doses of Lasix and Aldactone until the patient is evaluated by primary team on rounds tomorrow.      Intervention Category Major Interventions: Hypotension - evaluation and management  Fraida Veldman Cornelia Copa 11/24/2019, 11:42 PM

## 2019-11-24 NOTE — Progress Notes (Signed)
OT Cancellation Note  Patient Details Name: Isaac Pierce MRN: [id number] DOB: [birth year]   Cancelled Treatment:    Reason Eval/Treat Not Completed: Other (comment)(Patient declined secondary to pain. Cleared by Nurse ) Patient received pain medications prior to therapist arrival, but declined OT/PT co-evaluation and OOB transfer to recliner. Will attempt OT eval on different date and time.   Isaac Pierce OTR/L   [name] 11/24/2019, 10:53 AM

## 2019-11-24 NOTE — Progress Notes (Signed)
[facility] Progress Note Patient Name: Isaac Pierce DOB: 1961-03-30 MRN: [id number]   Date of Service  11/24/2019  HPI/Events of Note  Hypotension - BP = 91/42 with MAP = 56. Patient responded nicely to earlier fluid challenge.   eICU Interventions  Will order:  1. Bolus with 0.9 NaCl 1 Liter IV over 1 hour now.      Intervention Category Major Interventions: Hypotension - evaluation and management  Darlene Brozowski [place] 11/24/2019, 5:24 AM

## 2019-11-24 NOTE — Progress Notes (Signed)
[facility] Progress Note Patient Name: Isaac Pierce DOB: [birth year] MRN: [id number]   Date of Service  11/24/2019  HPI/Events of Note  Hypotension - BP = 98/36 with MAP = 51.   eICU Interventions  Will order: 1. Phenylephrine IV infusion via peripheral IV. Titrate to MAP >= 65.      Intervention Category Major Interventions: Hypotension - evaluation and management  Nickisha Hum Cornelia Copa 11/24/2019, 6:32 AM

## 2019-11-24 NOTE — Progress Notes (Signed)
NAME:  Isaac Pierce, MRN:  [id number], DOB:  08-06-1961, LOS: 3 ADMISSION DATE:  11/10/2019, CONSULTATION DATE: 11/23/2019 REFERRING MD: Dr. 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 During that admission he had bilateral recurrent effusions rx  bilateral Pleurx catheters (placed on 2/9) and new diagnosis of DVT (discharged with eliquis)  Admitted back progressive dyspnea x4 days PTA and febrile in ED 100.4 with lactic acidosis and hemoglobin of 6.6.  He had been draining his catheter every other day- on the right side with 1 L drainage on 2/20 and left side was drained on 2/19 and  due to be drained again.  Past Medical History    has a past medical history of Anxiety, Back pain, and GERD (gastroesophageal reflux disease).  CLL, B-cell lymphoma, DVT, effusions, DVT  He has significant hypersensitivity reaction to Rituxan,obinutuzumab, failed CHOP chemotherapy, currently on single agent therapy with acalabrutinib  Significant Hospital Events   2/21-admit  Consults:  PCCM  Procedures:  L pleurex drained = 750 cc 2/22  R Pleurex drain  2/24 =   Significant Diagnostic Tests:     Micro Data:  Blood cultures 2/21 > 1/2 pos gpc's coag neg  Sputum culture > not sent  Urine culture 2/22  neg RVP  2/21 > neg  Covid PCR 2/21 > neg MRSA 2/21 > neg  Stood c iff 2/23 > not sent (not stool per nursing)  R effusion 2/24 >>>  Antimicrobials:  Vanco 2/22 >>    Cefepime 2/22 >>   Interim history/subjective:  Worse sob / bp dropped on cardizem for ST required neo again   Scheduled Meds: . Chlorhexidine Gluconate Cloth  6 each Topical Daily  . famotidine  20 mg Oral Daily  . [START ON 11/25/2019] pantoprazole  40 mg Oral QAC breakfast  . sodium chloride flush  10-40 mL Intracatheter Q12H   Continuous Infusions: . sodium chloride 75 mL/hr at 11/24/19 0702   . sodium chloride Stopped (11/24/19 0707)  . ceFEPime (MAXIPIME) IV Stopped (11/24/19 0531)  . phenylephrine (NEO-SYNEPHRINE) Adult infusion 25 mcg/min (11/24/19 0701)  . vancomycin 750 mg (11/24/19 0847)   PRN Meds:.acetaminophen, HYDROcodone-acetaminophen, hydrOXYzine, sodium chloride flush, traMADol   Objective   Blood pressure (!) 98/36, pulse (!) 117, temperature 99.1 F (37.3 C), temperature source Oral, resp. rate 18, height 5\' 11"  (1.803 m), weight 69 kg, SpO2 91 %.        Intake/Output Summary (Last 24 hours) at 11/24/2019 [id number] Last data filed at 11/24/2019 0600 Gross per 24 hour  Intake 2086.78 ml  Output 1050 ml  Net 1036.78 ml   Filed Weights   11/22/19 0449 11/23/19 0500 11/24/19 0455  Weight: 60.7 kg 61.3 kg 69 kg    Examination: Pt alert, chronically ill > acutely and  Mild increased wob on RA at 45 degrees hob No jvd Oropharynx clear,  mucosa nl/ poor dentition  Neck supple Lungs clear x decreased bs bases  RRR no s3 or or sign murmur Abd soft/nl excursion / eating ok no stools Extr warm with 2+ sym pitting  edema   Neuro  Sensorium intact,  no apparent motor deficits     Resolved Hospital Problem list     Assessment & Plan:   Septic shock  Possible HCAP given lactic acidosis, bilateral infiltrates chest x-ray - note elevated PCT 2/22  Trending up 2/24  Plan Continue  Vanco, cefepime  Serial PCT  Recheck cxr p pull off R effusions 2/24 and send for gm stain/ culture Neo titrated to sbp > 90     Acute anemia, thrombocytopenia Lab Results  Component Value Date   PLT 41 (L) 11/24/2019   PLT 44 (L) 11/23/2019   PLT 52 (L) 11/22/2019   PLT 87 (L) 10/28/2019   PLT 48 (L) 10/21/2019   PLT 66 (L) 10/15/2019      Lab Results  Component Value Date   HGB 8.0 (L) 11/24/2019   HGB 7.5 (L) 11/23/2019   HGB 7.6 (L) 11/22/2019   HGB 7.1 (L) 10/28/2019   HGB 7.7 (L) 10/21/2019   HGB 8.6 (L) 10/15/2019    No evidence of active bleed.  May be  related to CLL/ chemo  S/p 2 units RBC 2/21   Plan  Trend CBC Transfuse for hemoglobin < 7  Heme/onc eval rec tx for hgb < 8 reasonable given tenuous BP    Left lower extremity DVT Plan  -Continue to hold due to above   Lactic Acidosis in setting of hypoperfusion in shock state, suspect hypovolemia. 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 10/19/19 Carlis Abbott)  -Careful with removal of pleural fluid as may precipitate circulatory shock/ worse hyponatremia if already physiologically based ADH excess      Best practice:  Diet: Regular  DVT prophylaxis: hold due to low plt, need for tx GI prophylaxis: Pepcid hs/ ppi qam  Glucose control: [place] Mobility: Bed Code Status: Full Family Communication:  Direct to pt  Disposition: keep on ccm service as of 2/24 due to suspected low grade sepsis     Labs   CBC: Recent Labs  Lab 11/12/2019 1356 11/22/19 0447 11/22/19 1211 11/23/19 0339 11/24/19 0451  WBC 3.0* 2.5*  --  2.5* 1.8*  NEUTROABS 1.5*  --   --   --   --   HGB 6.6* 7.6*  --  7.5* 8.0*  HCT 20.9* 23.6*  --  23.6* 25.3*  MCV 89.7 86.1  --  87.4 87.5  PLT 55* 53* 52* 44* 41*    Basic Metabolic Panel: Recent Labs  Lab 11/16/2019 1356 11/22/19 0447 11/23/19 0339  NA 129* 126* 124*  K 4.5 3.9 4.1  CL 98 98 98  CO2 20* 20* 17*  GLUCOSE 111* 84 98  BUN 31* 29* 18  CREATININE 0.78 0.52* 0.74  CALCIUM 7.5*  7.4* 7.5*  MG  --  1.2* 1.4*  PHOS  --  2.8 2.2*   GFR: Estimated Creatinine Clearance: 100.6 mL/min (by C-G formula based on SCr of 0.74 mg/dL). Recent Labs  Lab 11/06/2019 1356 11/28/2019 1556 11/20/2019 2000 11/22/19 0447 11/22/19 1211 11/23/19 0339 11/23/19 1102 11/24/19 0451  PROCALCITON  --   --   --   --  5.29  --  5.56 11.15  WBC 3.0*  --   --  2.5*  --  2.5*  --  1.8*  LATICACIDVEN 4.4* 3.7* 1.4  --   --   --   --   --     Liver Function Tests: Recent Labs  Lab 11/09/2019 1356  AST 29  ALT 18  ALKPHOS 384*  BILITOT 0.4  PROT 4.1*  ALBUMIN 1.6*   No results for input(s): LIPASE, AMYLASE in the last 168 hours. No results for input(s): AMMONIA in the last 168 hours.  ABG    Component Value Date/Time   PHART 7.404 10/29/2019 2140   PCO2ART 42.5 10/29/2019 2140   PO2ART 59.5 (L) 10/29/2019 2140   HCO3 26.0 10/29/2019 2140   ACIDBASEDEF 1.4 10/29/2019 1404   O2SAT 87.4 10/29/2019 2140     Coagulation Profile: Recent Labs  Lab 11/07/2019 1356 11/22/19 1211  INR 2.3* 1.7*    Cardiac Enzymes: No results for input(s): CKTOTAL, CKMB, CKMBINDEX, TROPONINI in the last 168 hours.  HbA1C: Hgb A1c MFr Bld  Date/Time Value Ref Range Status  10/30/2019 10:00 AM 5.7 (H) 4.8 - 5.6 % Final    Comment:    (NOTE) Pre diabetes:          5.7%-6.4% Diabetes:              >6.4% Glycemic control for   <7.0% adults with diabetes     CBG: No results for input(s): GLUCAP in the last 168 hours.    The patient is critically ill with multiple organ systems failure and requires high complexity decision making for assessment and support, frequent evaluation and titration of therapies, application of advanced monitoring technologies and extensive interpretation of multiple databases. Critical Care Time devoted to patient care services described in this note is 50  minutes.    Christinia Gully, MD Pulmonary and [place] [phone] After  5:30 PM or weekends, use Beeper [phone]

## 2019-11-24 NOTE — Progress Notes (Signed)
Drew off 850 mL of pleural fluid from the pts right pleurx drain per MD request. Pt tolerated it well. New dressing was applied.

## 2019-11-24 NOTE — Progress Notes (Signed)
PT Cancellation Note  Patient Details Name: Isaac Pierce MRN: [id number] DOB: [birth year]   Cancelled Treatment:    Reason Eval/Treat Not Completed: Patient declined, no reason specified. Pt declined participation today despite encouragement from PT/OT. He requested therapy check back another day.    Doreatha Massed, PT Acute Rehabilitation

## 2019-11-25 DIAGNOSIS — Z66 Do not resuscitate: Secondary | ICD-10-CM

## 2019-11-25 DIAGNOSIS — Z9221 Personal history of antineoplastic chemotherapy: Secondary | ICD-10-CM

## 2019-11-25 DIAGNOSIS — D61818 Other pancytopenia: Secondary | ICD-10-CM

## 2019-11-25 LAB — BLOOD GAS, ARTERIAL
Acid-base deficit: 15 mmol/L — ABNORMAL HIGH (ref 0.0–2.0)
Bicarbonate: 9.9 mmol/L — ABNORMAL LOW (ref 20.0–28.0)
O2 Saturation: 94.3 %
Patient temperature: 37
pCO2 arterial: 21 mmHg — ABNORMAL LOW (ref 32.0–48.0)
pH, Arterial: 7.296 — ABNORMAL LOW (ref 7.350–7.450)
pO2, Arterial: 76 mmHg — ABNORMAL LOW (ref 83.0–108.0)

## 2019-11-25 LAB — BASIC METABOLIC PANEL
Anion gap: 11 (ref 5–15)
BUN: 28 mg/dL — ABNORMAL HIGH (ref 6–20)
CO2: 13 mmol/L — ABNORMAL LOW (ref 22–32)
Calcium: 7.5 mg/dL — ABNORMAL LOW (ref 8.9–10.3)
Chloride: 102 mmol/L (ref 98–111)
Creatinine, Ser: 1.01 mg/dL (ref 0.61–1.24)
GFR calc Af Amer: >60 mL/min (ref >60)
GFR calc non Af Amer: >60 mL/min (ref >60)
Glucose, Bld: 39 mg/dL — CL (ref 70–99)
Potassium: 4.6 mmol/L (ref 3.5–5.1)
Sodium: 126 mmol/L — ABNORMAL LOW (ref 135–145)

## 2019-11-25 LAB — GLUCOSE, CAPILLARY
Glucose-Capillary: 31 mg/dL — CL (ref 70–99)
Glucose-Capillary: 74 mg/dL (ref 70–99)

## 2019-11-25 LAB — PH, BODY FLUID: pH, Body Fluid: 7.8

## 2019-11-25 LAB — TRIGLYCERIDES, BODY FLUIDS: Triglycerides, Fluid: 67 mg/dL

## 2019-11-25 LAB — CBC
HCT: 30.6 % — ABNORMAL LOW (ref 39.0–52.0)
Hemoglobin: 9.6 g/dL — ABNORMAL LOW (ref 13.0–17.0)
MCH: 27.4 pg (ref 26.0–34.0)
MCHC: 31.4 g/dL (ref 30.0–36.0)
MCV: 87.4 fL (ref 80.0–100.0)
Platelets: 41 10*3/uL — ABNORMAL LOW (ref 150–400)
RBC: 3.5 MIL/uL — ABNORMAL LOW (ref 4.22–5.81)
RDW: 18.6 % — ABNORMAL HIGH (ref 11.5–15.5)
WBC: 3 10*3/uL — ABNORMAL LOW (ref 4.0–10.5)
nRBC: 0 % (ref 0.0–0.2)

## 2019-11-25 LAB — PROCALCITONIN: Procalcitonin: 25.94 ng/mL

## 2019-11-25 LAB — VANCOMYCIN, PEAK: Vancomycin Pk: 39 ug/mL (ref 30–40)

## 2019-11-25 LAB — SURGICAL PATHOLOGY

## 2019-11-25 LAB — CYTOLOGY - NON PAP

## 2019-11-25 MED ORDER — MORPHINE SULFATE (PF) 2 MG/ML IV SOLN
1.0000 mg | INTRAVENOUS | Status: DC | PRN
  Administered 2019-11-25: 1 mg via INTRAVENOUS
  Filled 2019-11-25: qty 1

## 2019-11-25 MED ORDER — NOREPINEPHRINE 4 MG/250ML-% IV SOLN
0.0000 ug/min | INTRAVENOUS | Status: DC
  Administered 2019-11-25: 34 ug/min via INTRAVENOUS
  Administered 2019-11-25: 5.333 ug/min via INTRAVENOUS
  Administered 2019-11-25: 15:00:00 34 ug/min via INTRAVENOUS
  Administered 2019-11-25: 2 ug/min via INTRAVENOUS
  Administered 2019-11-26: 40 ug/min via INTRAVENOUS
  Filled 2019-11-25 (×7): qty 250

## 2019-11-25 MED ORDER — PHENOL 1.4 % MT LIQD
1.0000 | OROMUCOSAL | Status: DC | PRN
  Administered 2019-11-25: 1 via OROMUCOSAL
  Filled 2019-11-25: qty 177

## 2019-11-25 MED ORDER — DEXTROSE 50 % IV SOLN
INTRAVENOUS | Status: AC
  Administered 2019-11-25: 50 mL
  Filled 2019-11-25: qty 50

## 2019-11-25 NOTE — Progress Notes (Signed)
[facility] Progress Note Patient Name: Isaac Pierce DOB: 29-Jun-1961 MRN: [id number]   Date of Service  11/25/2019  HPI/Events of Note  Multiple issues: 1. Hypotension - BP = 96/52 on Phenylephrine IV infusion and 2. Throat pain.   eICU Interventions  Will order: 1. ABG STAT. 2. Chloraseptic spray 1 spray to throat PRN.      Intervention Category Major Interventions: Hypotension - evaluation and management;Other:  Shalamar Plourde Cornelia Copa 11/25/2019, 4:01 AM

## 2019-11-25 NOTE — Progress Notes (Signed)
Patient is having increased difficulty breathing and appearance overall looks poor. Pressors have slowly been titrated upwards, HR has become elevated into the 120's. PT will not continuously wear oxygen via [place], currently removed by patient and o2 sats on room air is 87%. I have asked the patient about medication for discomfort multiple times, he declines wanting anything. PT does not want me to assist him in repositioning. Will try to let patient rest, will contact [name] when we start reaching our max on pressors.

## 2019-11-25 NOTE — Progress Notes (Signed)
PT Cancellation Note  Patient Details Name: Isaac Pierce MRN: [id number] DOB: 23-Feb-1961   Cancelled Treatment:     PT eval attempted but deferred 2* pt fatigue.  Pt with HR 122 and SaO2 88% on RA - O2 placed back on pt and RN advised.  Will follow.  Debe Coder PT Acute Rehabilitation Services Pager [phone] Office [phone]    Debe Coder 11/25/2019, 3:50 PM

## 2019-11-25 NOTE — Progress Notes (Signed)
Was informed by pharmacy that patient needed vanc. trough drawn before vanc. was started at 2000. Nurse was not aware of lab draw. Will draw lab at 0430 before next dose of vancomycin.

## 2019-11-25 NOTE — Progress Notes (Signed)
Pharmacy Antibiotic Note  Isaac Pierce is a 59 y.o. 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  He was started on vancomycin and cefepime on admission for suspected PNA/sepsis. MRSA pcr was negative with vancomycin d/ced on 2/22. 1/2 blood cx sets with CoNS with vancomycin resumed on 2/23.  Today, 11/25/2019: -  afeb, wbc low, ANC 1.5 - scr trending up 1.01 (crcl~79), UOP 0.5 ml/kg/hr -  PCT up 25.94  Plan: - continue with cefepime 2gm IV q8h and vancomycin 750 mg IV q12h for now - with scr trending up, will check vancomycin peak and trough levels today to assess drug clearance  ________________________________________  Height: 5\' 11"  (180.3 cm) Weight: 152 lb 1.9 oz (69 kg) IBW/kg (Calculated) : 75.3  Temp (24hrs), Avg:98.3 F (36.8 C), Min:97 F (36.1 C), Max:99.3 F (37.4 C)  Recent Labs  Lab 11/08/2019 1356 11/11/2019 1556 11/08/2019 2000 11/22/19 0447 11/23/19 0339 11/24/19 0451 11/24/19 1121 11/25/19 0545  WBC 3.0*  --   --  2.5* 2.5* 1.8*  --  3.0*  CREATININE 0.78  --   --  0.52* 0.74  --  0.79 1.01  LATICACIDVEN 4.4* 3.7* 1.4  --   --   --   --   --     Estimated Creatinine Clearance: 79.7 mL/min (by C-G formula based on SCr of 1.01 mg/dL).    Allergies  Allergen Reactions  . Obinutuzumab Shortness Of Breath    Reaction on 08/28/2020  . Rituximab Rash    Had rash, anxiety, HTN & tachycardia ~63min into 1st infusion 08/27/19    Antimicrobials this admission: 2/21 vanc >>2/22>> resumed 2/23 >> 2/21 cefepime >> 2/21 flagyl x 1  Microbiology results: 2/21 bcx x2: 1/2 GPC in clusters, CoNS 2/21 UCx: neg FINAL 2/21 MRSA PCR: neg 2/22 ur legionella: neg 2/21 ur strep pneum: neg 2/24 pleural fluid fungus stain:  2/24 pleural fluid cx:  Thank you for allowing pharmacy to be a part of this patient's care.  [name] Doctor 11/25/2019 10:08 AM

## 2019-11-25 NOTE — Progress Notes (Signed)
OT Cancellation Note/Evaluation  Patient Details Name: Isaac Pierce MRN: [id number] DOB: [birth year]   Cancelled Treatment:    Reason Eval/Treat Not Completed: Other (comment)(Patient declined secondary to not feeling well, medical concern with low BP issues) Will attempt OT evaluation on different date and time.   Chloe Baig OTR/L   Ayah Cozzolino 11/25/2019, 2:41 PM

## 2019-11-25 NOTE — Progress Notes (Signed)
[place] Progress Note Patient Name: Isaac Pierce DOB: 27-Feb-1961 MRN: [id number]   Date of Service  11/25/2019  HPI/Events of Note  Patient with advanced malignancy unresponsive to chemotherapy, he made himself DNR? DNI today but is not yet ready to transition to comfort measures per note in chart, Pt obviously uncomfortable when I went into his room via camera.  eICU Interventions  Morphine 1-2 mg iv Q 2 hours PRN pain or air hunger.        Frederik Pear 11/25/2019, 10:44 PM

## 2019-11-25 NOTE — Progress Notes (Addendum)
PCCM communication note  Provided daily updates to patient's sister. All questions answered.   Eliseo Gum MSN, AGACNP-BC [place] [id number] If no answer, [id number] 11/25/2019, 10:51 AM

## 2019-11-25 NOTE — Progress Notes (Addendum)
NAME:  Isaac Pierce, MRN:  [id number], DOB:  1960/10/09, LOS: 4 ADMISSION DATE:  11/19/2019, CONSULTATION DATE: 11/14/2019 REFERRING MD: Dr. 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 During that admission he had bilateral recurrent effusions rx  bilateral Pleurx catheters (placed on 2/9) and new diagnosis of L DVT (discharged with eliquis)  Admitted back progressive dyspnea x 4 days PTA and febrile in ED 100.4 with lactic acidosis and hemoglobin of 6.6.  He had been draining his catheter every other day- on the right side with 1 L drainage on 2/20 and left side was drained on 2/19 and  due to be drained again.  Past Medical History    has a past medical history of Anxiety, Back pain, and GERD (gastroesophageal reflux disease).  CLL, B-cell lymphoma, DVT, effusions, DVT  He has significant hypersensitivity reaction to Rituxan,obinutuzumab, failed CHOP chemotherapy, currently on single agent therapy with acalabrutinib  Significant Hospital Events   2/21-admit  Consults:  PCCM  Procedures:  L pleurex drained = 750 cc 2/22  R Pleurex drain  2/24 = 850 cc   Significant Diagnostic Tests:     Micro Data:  Blood cultures 2/21 > 1/2 pos gpc's coag neg  Sputum culture > not sent  Urine culture 2/22  neg RVP  2/21 > neg  Covid PCR 2/21 > neg MRSA 2/21 > neg  Stood c iff 2/23 > not sent (not stool per nursing)  R effusion 2/24 =  Few wbc/ no org seen >>> R effusion 2/24 fungus >>>  Antimicrobials:  Vanco 2/22 >>    Cefepime 2/22 >>   Interim history/subjective:  Back on pressors as feared would happen with pulling off R effusion  2/24  Which was done to relieve sob and now on nasal 02    Scheduled Meds: . Chlorhexidine Gluconate Cloth  6 each Topical Daily  . famotidine  20 mg Oral QHS  . furosemide  20 mg Oral Daily  . pantoprazole  40 mg Oral QAC  breakfast  . sodium chloride flush  10-40 mL Intracatheter Q12H  . spironolactone  12.5 mg Oral BID   Continuous Infusions: . sodium chloride Stopped (11/24/19 2301)  . sodium chloride Stopped (11/24/19 0707)  . ceFEPime (MAXIPIME) IV Stopped (11/25/19 0612)  . phenylephrine (NEO-SYNEPHRINE) Adult infusion 300 mcg/min (11/25/19 0910)  . vancomycin Stopped (11/25/19 0902)   PRN Meds:.acetaminophen, HYDROcodone-acetaminophen, hydrOXYzine, phenol, sodium chloride flush, traMADol   Objective   Blood pressure (!) 85/48, pulse 97, temperature 99 F (37.2 C), temperature source Axillary, resp. rate 18, height 5\' 11"  (1.803 m), weight 69 kg, SpO2 95 %. on 4lpm        Intake/Output Summary (Last 24 hours) at 11/25/2019 0932 Last data filed at 11/25/2019 0910 Gross per 24 hour  Intake 2706.45 ml  Output 1625 ml  Net 1081.45 ml   Filed Weights   11/23/19 0500 11/24/19 0455 11/25/19 0500  Weight: 61.3 kg 69 kg 69 kg    Examination:   Looking more and more terminally ill each day complaining of R Chest pain with deep breath since pulling off R effusion  Pt anxious/ sitting up at 60 degrees No jvd Oropharynx dry mucosa/ poor dentition  Neck supple Lungs with a  scattered exp > insp rhonchi bilaterally RRR no s3 or or sign murmur Abd soft  with nl  Excursion/ no stools/ poor  po intake  Extr warm with  2+ pitting on L/ 1+ on R   pCXR 2/24 personally reviewed: 1. No pneumothorax. 2. Small bilateral pleural effusions, slightly decreased on the right and stable on the left. 3. Stable hazy bibasilar lung opacities.  Resolved Hospital Problem list     Assessment & Plan:   Septic shock  Possible HCAP given lactic acidosis, bilateral infiltrates chest x-ray - note elevated PCT 2/22  Trending up 2/24  And 2/25 (see below) - now progressive metabolic acidosis though well compensated resp wise  Plan Continue Vanco, cefepime  Serial PCT  Changed to levophed am 2/25 EOL discussion  with pt/ Dr Earlie Server aware pt's comment was "what about rehab"    Acute anemia, thrombocytopenia Lab Results  Component Value Date   PLT 41 (L) 11/25/2019   PLT 41 (L) 11/24/2019   PLT 44 (L) 11/23/2019   PLT 87 (L) 10/28/2019   PLT 48 (L) 10/21/2019   PLT 66 (L) 10/15/2019      Lab Results  Component Value Date   HGB 9.6 (L) 11/25/2019   HGB 8.0 (L) 11/24/2019   HGB 7.5 (L) 11/23/2019   HGB 7.1 (L) 10/28/2019   HGB 7.7 (L) 10/21/2019   HGB 8.6 (L) 10/15/2019    No evidence of active bleed.  May be related to CLL/ chemo  S/p 2 units RBC 2/21   Plan  Trend CBC Heme/onc eval rec tx for hgb < 8 reasonable given tenuous BP and no anticoagulation until plt > 50 k    Recenly dx acute Left lower extremity DVT suspect still  present on readmit  Plan  -Continue to hold due to above   Lactic Acidosis in setting of hypoperfusion in shock state, suspect hypovolemia. Patient reported decreased oral intake over last week PTA    Hyponatremia  Hypomagnesemia  ] - cortisol/ tsh 2/23 nl  - Urine na/ osm classic SIADH though edema presence is not   Plan Consider SAMSA rx if any worsening    CLL/lymphoma Plan  Hold outpatient therapy for now Heme onc consulted 2/23 > no change rx for now, just supportive rx  Dr Earlie Server to discuss futility issues directly with pt  Clearly the high likelihood of prolonging suffering from pulmonary interventions vastly outweighs any reasonable chance of benefit from offering anything else because medical science has done all it can here to restore health.  Therefore  I don't have any additional recs  except to consider hospice sooner rather than later - paradoxically many patients with respiratory diseases live longer and better once a palliative approach is used in this setting.      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0*  --  2.5*  --  2.5* 1.8* 3.0*  NEUTROABS 1.5*  --   --   --   --   --   --   HGB 6.6*  --  7.6*  --  7.5* 8.0* 9.6*  HCT 20.9*  --  23.6*  --  23.6* 25.3* 30.6*  MCV 89.7  --  86.1  --  87.4 87.5 87.4  PLT 55*   < > 53* 52* 44* 41* 41*   < > = values in this interval not displayed.    Basic Metabolic Panel: Recent Labs  Lab 11/05/2019 1356 11/22/19 0447 11/23/19 0339 11/24/19 1121 11/25/19 0545  NA 129* 126* 124* 124* 126*  K 4.5 3.9 4.1 4.3 4.6  CL 98 98 98 100 102  CO2 20* 20* 17* 13* 13*  GLUCOSE 111* 84 98 96 39*  BUN 31* 29* 18 19 28*  CREATININE 0.78 0.52* 0.74 0.79 1.01  CALCIUM 7.5* 7.4* 7.5* 7.0* 7.5*  MG  --  1.2* 1.4* 1.5*  --   PHOS  --  2.8 2.2* 2.3*  --    GFR: Estimated Creatinine Clearance: 79.7 mL/min (by C-G formula based on SCr of 1.01 mg/dL). Recent Labs  Lab 11/11/2019 1356 11/16/2019 1356 11/17/2019 1556 11/15/2019 2000 11/22/19 0447 11/22/19 1211 11/23/19 0339 11/23/19 1102 11/24/19 0451 11/25/19 0545  PROCALCITON  --   --   --   --   --  5.29  --  5.56 11.15 25.94  WBC 3.0*   < >  --   --  2.5*  --  2.5*  --  1.8* 3.0*  LATICACIDVEN 4.4*  --  3.7* 1.4  --   --   --   --   --   --    < > = values in this interval not displayed.    Liver Function Tests: Recent Labs  Lab 11/08/2019 1356 11/24/19 1121  AST 29  --   ALT 18  --   ALKPHOS 384*  --     BILITOT 0.4  --   PROT 4.1*  --   ALBUMIN 1.6* 1.4*   No results for input(s): LIPASE, AMYLASE in the last 168 hours. No results for input(s): AMMONIA in the last 168 hours.  ABG    Component Value Date/Time   PHART 7.296 (L) 11/25/2019 0910   PCO2ART 21.0 (L) 11/25/2019 0910   PO2ART 76.0 (L) 11/25/2019 0910   HCO3 9.9 (L) 11/25/2019 0910   ACIDBASEDEF 15.0 (H) 11/25/2019 0910   O2SAT 94.3 11/25/2019 0910     Coagulation Profile: Recent Labs  Lab 11/28/2019 1356 11/22/19 1211  INR 2.3* 1.7*    Cardiac Enzymes: No results for input(s): CKTOTAL, CKMB, CKMBINDEX, TROPONINI in the last 168 hours.  HbA1C: Hgb A1c MFr Bld  Date/Time Value Ref Range Status  10/30/2019 10:00 AM 5.7 (H) 4.8 - 5.6 % Final    Comment:    (NOTE) Pre diabetes:          5.7%-6.4% Diabetes:              >6.4% Glycemic control for   <7.0% adults with diabetes     CBG: Recent Labs  Lab 11/25/19 0646 11/25/19 0722  GLUCAP 31* 74     The patient is critically ill with multiple organ systems failure and requires high complexity decision making for assessment and support, frequent evaluation and titration of therapies, application of advanced monitoring technologies and extensive interpretation of multiple databases. Critical Care Time devoted to patient care services  described in this note is 45 minutes.     Christinia Gully, MD [facility] [place] [phone] After 5:30 PM or weekends, use Beeper [phone]

## 2019-11-25 NOTE — Progress Notes (Signed)
Vancomycin trough not drawn before 2000 dose was given. Placed orders for RN to draw trough at 0730 and await instructions from pharmacy before giving the 0800 dose.   Darnelle Bos, PharmD 11/25/19 8:15 PM

## 2019-11-25 NOTE — Progress Notes (Addendum)
HEMATOLOGY-ONCOLOGY PROGRESS NOTE  SUBJECTIVE: Chart has been reviewed.  Note that he has been developing worsening shock and increased respiratory effort.  Requiring increased doses of pressors.  CODE STATUS has been changed to DNR/DNI.  When seen, he was resting very quietly but does appear tachypneic.  Oncology History  Non-Hodgkin lymphoma ([place])  08/25/2019 Initial Diagnosis   Non-Hodgkin lymphoma ([place])   08/25/2019 - 08/31/2019 Chemotherapy   The patient had riTUXimab (RITUXAN) 700 mg in sodium chloride 0.9 % 250 mL (2.1875 mg/mL) infusion, 375 mg/m2 = 700 mg (100 % of original dose 375 mg/m2), Intravenous,  Once, 1 of 1 cycle Dose modification: 375 mg/m2 (original dose 375 mg/m2, Cycle 1) Administration: 700 mg (08/25/2019) riTUXimab-pvvr (RUXIENCE) 700 mg in sodium chloride 0.9 % 250 mL (2.1875 mg/mL) infusion, 375 mg/m2 = 700 mg (100 % of original dose 375 mg/m2), Intravenous,  Once, 0 of 3 cycles Dose modification: 375 mg/m2 (original dose 375 mg/m2, Cycle 2), 375 mg/m2 (original dose 375 mg/m2, Cycle 3)  for chemotherapy treatment.    09/01/2019 - 10/12/2019 Chemotherapy   The patient had DOXOrubicin (ADRIAMYCIN) chemo injection 68 mg, 37.5 mg/m2 = 68 mg (75 % of original dose 50 mg/m2), Intravenous,  Once, 2 of 6 cycles Dose modification: 37.5 mg/m2 (75 % of original dose 50 mg/m2, Cycle 1, Reason: Provider Judgment) Administration: 68 mg (09/01/2019), 68 mg (09/22/2019) palonosetron (ALOXI) injection 0.25 mg, 0.25 mg, Intravenous,  Once, 2 of 6 cycles Administration: 0.25 mg (09/01/2019), 0.25 mg (09/22/2019) pegfilgrastim-cbqv (UDENYCA) injection 6 mg, 6 mg, Subcutaneous, Once, 1 of 5 cycles Administration: 6 mg (09/23/2019) vinCRIStine (ONCOVIN) 1.5 mg in sodium chloride 0.9 % 50 mL chemo infusion, 1.5 mg (75 % of original dose 2 mg), Intravenous,  Once, 2 of 6 cycles Dose modification: 1.5 mg (75 % of original dose 2 mg, Cycle 1, Reason: Provider Judgment) Administration: 1.5 mg  (09/01/2019), 1.5 mg (09/22/2019) riTUXimab (RITUXAN) 700 mg in sodium chloride 0.9 % 250 mL (2.1875 mg/mL) infusion, 375 mg/m2 = 700 mg (100 % of original dose 375 mg/m2), Intravenous,  Once, 1 of 1 cycle Dose modification: 375 mg/m2 (original dose 375 mg/m2, Cycle 1) Administration: 700 mg (09/01/2019) cyclophosphamide (CYTOXAN) 1,020 mg in sodium chloride 0.9 % 250 mL chemo infusion, 562.5 mg/m2 = 1,020 mg (75 % of original dose 750 mg/m2), Intravenous,  Once, 2 of 6 cycles Dose modification: 562.5 mg/m2 (75 % of original dose 750 mg/m2, Cycle 1, Reason: Provider Judgment) Administration: 1,020 mg (09/01/2019), 1,020 mg (09/22/2019) fosaprepitant (EMEND) 150 mg, dexamethasone (DECADRON) 12 mg in sodium chloride 0.9 % 145 mL IVPB, , Intravenous,  Once, 2 of 6 cycles Administration:  (09/01/2019),  (09/22/2019) riTUXimab-pvvr (RUXIENCE) 700 mg in sodium chloride 0.9 % 250 mL (2.1875 mg/mL) infusion, 375 mg/m2 = 700 mg, Intravenous,  Once, 1 of 1 cycle  for chemotherapy treatment.    10/22/2019 - 10/29/2019 Chemotherapy   The patient had obinutuzumab (GAZYVA) 100 mg in sodium chloride 0.9 % 100 mL (0.9615 mg/mL) chemo infusion, 100 mg, Intravenous, Once, 1 of 6 cycles Administration: 100 mg (10/22/2019), 1,000 mg (10/29/2019)  for chemotherapy treatment.       REVIEW OF SYSTEMS:   As noted in the HPI.  I have reviewed the past medical history, past surgical history, social history and family history with the patient and they are unchanged from previous note.   PHYSICAL EXAMINATION: ECOG PERFORMANCE STATUS: 2 - Symptomatic, <50% confined to bed  Vitals:   11/25/19 1130 11/25/19 1200  BP: (!) 89/53 (!) 88/49  Pulse: (!) 126 (!) 125  Resp: (!) 29 (!) 27  Temp:  97.8 F (36.6 C)  SpO2: 90% 90%   Filed Weights   11/23/19 0500 11/24/19 0455 11/25/19 0500  Weight: 135 lb 2.3 oz (61.3 kg) 152 lb 1.9 oz (69 kg) 152 lb 1.9 oz (69 kg)    Intake/Output from previous day: 02/24 0701 - 02/25  0700 In: 2320.3 [I.V.:1529.8; IV Piggyback:790.6] Out: 1625 [Urine:775; Chest Tube:850]  GENERAL: Chronically ill-appearing male, appears tachypneic OROPHARYNX: No thrush or mucositis LUNGS: Scattered expiratory wheezes and rhonchi bilaterally HEART: Tachycardic, pitting edema to the bilateral lower extremities ABDOMEN:abdomen soft, non-tender and normal bowel sounds Musculoskeletal:no cyanosis of digits and no clubbing  NEURO: alert & oriented x 3 with fluent speech, no focal motor/sensory deficits  LABORATORY DATA:  I have reviewed the data as listed CMP Latest Ref Rng & Units 11/25/2019 11/24/2019 11/23/2019  Glucose 70 - 99 mg/dL 39(LL) 96 98  BUN 6 - 20 mg/dL 28(H) 19 18  Creatinine 0.61 - 1.24 mg/dL 1.01 0.79 0.74  Sodium 135 - 145 mmol/L 126(L) 124(L) 124(L)  Potassium 3.5 - 5.1 mmol/L 4.6 4.3 4.1  Chloride 98 - 111 mmol/L 102 100 98  CO2 22 - 32 mmol/L 13(L) 13(L) 17(L)  Calcium 8.9 - 10.3 mg/dL 7.5(L) 7.0(L) 7.5(L)  Total Protein 6.5 - 8.1 g/dL - - -  Total Bilirubin 0.3 - 1.2 mg/dL - - -  Alkaline Phos 38 - 126 U/L - - -  AST 15 - 41 U/L - - -  ALT 0 - 44 U/L - - -    Lab Results  Component Value Date   WBC 3.0 (L) 11/25/2019   HGB 9.6 (L) 11/25/2019   HCT 30.6 (L) 11/25/2019   MCV 87.4 11/25/2019   PLT 41 (L) 11/25/2019   NEUTROABS 1.5 (L) 11/04/2019    CT CHEST WO CONTRAST  Result Date: 10/29/2019 CLINICAL DATA:  With lymphoma, abnormal radiograph EXAM: CT CHEST WITHOUT CONTRAST TECHNIQUE: Multidetector CT imaging of the chest was performed following the standard protocol without IV contrast. COMPARISON:  Radiograph 10/28/2018, CT 08/25/2019 FINDINGS: Cardiovascular: Left IJ approach Port-A-Cath is accessed with the tip positioned at the right atrium. Normal cardiac size. Redemonstration of the extensive pericardial soft tissue attenuation lesions these appear to extend more superiorly towards the cardiac base than on comparison study including a larger crescentic  amount of soft tissue measuring approximately 6.9 cm anteroposterior and 1.7 cm in thickness near the level of the pulmonary trunk (2/79). Central pulmonary arteries are normal caliber. Atherosclerotic plaque within the normal caliber aorta. Normal 3 vessel branching of the aortic arch with minimal plaque in the great vessels. Mediastinum/Nodes: Decrease in the size of bulky thoracic lymphadenopathy compatible with patient's known lymphoproliferative disorder. Index lymph nodes include: *A high right paratracheal node measuring 12 mm short axis (2/45), previously 23 mm *AP window lymph node measuring 24 mm (4/35), previously 29 mm *Right hilar node measuring 15 mm (2/85), previously 21 mm *Subcarinal node measuring 18 mm (2/69), previously 26 mm *Right inter lobar lymph node on comparison is difficult to assess given absence of contrast media. Thyroid gland is unremarkable. No acute abnormality of the trachea or esophagus. Lungs/Pleura: Large bilateral pleural effusions are noted. Dense bandlike areas of atelectasis are noted in both lungs. Multiple nodules are seen in the right middle and lower lobe examples include a 8 mm nodule laterally (4/102) in lower lobe and a subpleural 6 mm  nodule anteriorly in the right middle lobe (4/98). No visible nodules seen in the left lung though underlying atelectasis could obscure the presence of nodules or masses. Upper Abdomen: Marked splenomegaly. Some wedge-shaped hypoattenuation in the periphery of the spleen measuring 1.6 x 3.2 cm, compatible with sequela of remote splenic infarct. Indeterminate exophytic 12 mm lesion arising from the upper pole right kidney. Musculoskeletal: Multilevel degenerative changes are present in the imaged portions of the spine. Evaluation of the chest wall limited given extensive motion artifact which may limit detection of sternal and rib fractures. No gross acute osseous abnormality or suspicious osseous lesions. IMPRESSION: 1. Mixed response  of patient's lymphoproliferative disease with overall decrease in the size of bulky thoracic lymphadenopathy but with increasing pericardial soft tissue masses and multiple new lung nodules. 2. Large bilateral pleural effusions with associated compressive atelectasis. 3. 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 4. Indeterminate 12 mm lesion arising from the upper pole of the right kidney. Indeterminate on noncontrast CT. Could consider follow-up with nonemergent outpatient MR or CT. 5. Evaluation of the chest wall limited given extensive motion artifact which may limit detection of sternal and rib fractures. No gross acute osseous abnormality. 6.  Aortic Atherosclerosis (ICD10-I70.0). Electronically Signed   By: Lovena Le M.D.   On: 10/29/2019 20:36   DG Chest Port 1 View  Result Date: 11/24/2019 CLINICAL DATA:  Status post right thoracentesis EXAM: PORTABLE CHEST 1 VIEW COMPARISON:  11/22/2019 chest radiograph. FINDINGS: Stable right internal jugular Port-A-Cath terminating over the cavoatrial junction. Bibasilar pleural drains are stable. Stable cardiomediastinal silhouette with normal heart size. No pneumothorax. Small bilateral pleural effusions, slightly decreased on the right and stable on the left. No overt pulmonary edema. Hazy bibasilar lung opacities are similar. IMPRESSION: 1. No pneumothorax. 2. Small bilateral pleural effusions, slightly decreased on the right and stable on the left. 3. Stable hazy bibasilar lung opacities. Electronically Signed   By: Ilona Sorrel M.D.   On: 11/24/2019 12:44   DG Chest Port 1 View  Result Date: 11/22/2019 CLINICAL DATA:  Acute respiratory failure EXAM: PORTABLE CHEST 1 VIEW COMPARISON:  Radiograph 11/08/2019 FINDINGS: Right IJ approach Port-A-Cath tip terminates at the right atrium, currently accessed by [facility] needle. Bilateral pleural and mediastinal drains are in stable position. Telemetry leads overlie the chest. Small bilateral pleural  effusions are again noted, not significantly changed from 1 day prior. A hazy basilar opacity likely reflecting combination of atelectasis and layering pleural fluid. Cardiomediastinal contours are stable. Underlying airspace disease is possible as well. No visible pneumothorax. No acute osseous or soft tissue abnormality. IMPRESSION: Stable appearance of the chest with small bilateral pleural effusions and bibasilar opacities likely reflecting atelectasis and/or airspace disease. Electronically Signed   By: Lovena Le M.D.   On: 11/22/2019 04:59   DG Chest Port 1 View  Result Date: 11/06/2019 CLINICAL DATA:  Shortness of breath and fever EXAM: PORTABLE CHEST 1 VIEW COMPARISON:  Chest radiograph dated 11/09/2019 FINDINGS: A right internal jugular central venous port catheter tip overlies the cavoatrial junction. Bilateral pleural and a mediastinal drain appear unchanged in position compared to prior exam. The cardiac silhouette is unchanged. Small bilateral pleural effusions with associated atelectasis/airspace disease are not significantly changed. There is no pneumothorax. The osseous structures are intact. IMPRESSION: Small bilateral pleural effusions with associated atelectasis/airspace disease are not significantly changed compared to prior exam. Electronically Signed   By: Zerita Boers M.D.   On: 11/03/2019 14:49   DG CHEST PORT  1 VIEW  Result Date: 11/09/2019 CLINICAL DATA:  Pleural effusions. EXAM: PORTABLE CHEST 1 VIEW COMPARISON:  11/08/2019 FINDINGS: The right sided Port-A-Cath is stable. There are new bilateral PleurX drainage catheters. These appear to be in good position without complicating features. Slightly smaller pleural effusions. No pneumothorax. Stable cardiomediastinal silhouette. No pulmonary edema. IMPRESSION: New bilateral PleurX drainage catheters in good position without complicating features. Slight interval decrease in size of pleural effusions and overlying atelectasis.  Electronically Signed   By: Marijo Sanes M.D.   On: 11/09/2019 16:37   DG CHEST PORT 1 VIEW  Result Date: 11/08/2019 CLINICAL DATA:  History of prior effusions EXAM: PORTABLE CHEST 1 VIEW COMPARISON:  11/06/2019 FINDINGS: Cardiac shadow is stable. Right chest wall port is again seen. Slight increase in right-sided pleural effusion is noted. Stable left effusion is seen. Bibasilar atelectatic changes are noted. No bony abnormality is seen. IMPRESSION: Slight increase in right-sided pleural effusion when compared with the prior exam. Electronically Signed   By: Inez Catalina M.D.   On: 11/08/2019 13:35   DG Chest Port 1 View  Result Date: 11/06/2019 CLINICAL DATA:  Status post right thoracentesis. EXAM: PORTABLE CHEST 1 VIEW COMPARISON:  November 05, 2019. FINDINGS: Stable cardiomediastinal silhouette. Right internal jugular Port-A-Cath is unchanged. No pneumothorax is noted. Right pleural effusion is smaller status post thoracentesis. Stable small left pleural effusion with associated atelectasis. Bony thorax is unremarkable. IMPRESSION: No pneumothorax status post right thoracentesis. Right pleural effusion is smaller. Stable small left pleural effusion with associated atelectasis. Electronically Signed   By: Marijo Conception M.D.   On: 11/06/2019 12:17   DG Chest Port 1 View  Result Date: 11/05/2019 CLINICAL DATA:  Status post left thoracentesis EXAM: PORTABLE CHEST 1 VIEW COMPARISON:  11/04/2019 FINDINGS: Slight interval reduction in volume of a left pleural effusion, now small, with associated atelectasis or consolidation. No pneumothorax. Unchanged moderate, layering right pleural effusion and associated atelectasis or consolidation. Right chest port catheter. IMPRESSION: Slight interval reduction in volume of a left pleural effusion, now small, with associated atelectasis or consolidation. No pneumothorax. Electronically Signed   By: Eddie Candle M.D.   On: 11/05/2019 19:06   DG CHEST PORT 1  VIEW  Result Date: 11/04/2019 CLINICAL DATA:  Shortness of breath EXAM: PORTABLE CHEST 1 VIEW COMPARISON:  11/04/2019 FINDINGS: Bilateral pleural effusions are again seen. Vascular congestion is noted. Right chest wall port is again noted. The overall appearance is similar to that seen on the prior exam. IMPRESSION: Stable bilateral pleural effusions and vascular congestion similar to that seen on the prior exam. Electronically Signed   By: Inez Catalina M.D.   On: 11/04/2019 23:52   DG Chest Port 1 View  Result Date: 11/04/2019 CLINICAL DATA:  Follow-up pleural effusions. EXAM: PORTABLE CHEST 1 VIEW COMPARISON:  Multiple previous chest films. The most recent is 11/01/2019 FINDINGS: The right IJ power port is stable. Persistent moderate-sized bilateral pleural effusions with significant overlying atelectasis. Central vascular congestion and possible mild perihilar interstitial edema. IMPRESSION: Persistent moderate-sized bilateral pleural effusions with significant overlying atelectasis. Electronically Signed   By: Marijo Sanes M.D.   On: 11/04/2019 06:14   DG CHEST PORT 1 VIEW  Result Date: 11/01/2019 CLINICAL DATA:  Status post thoracentesis EXAM: PORTABLE CHEST 1 VIEW COMPARISON:  10/31/19 FINDINGS: Cardiac shadow is stable. Right chest wall port is noted. Small pleural effusions are noted bilaterally with basilar atelectasis. No pneumothorax is seen. IMPRESSION: Small pleural effusions bilaterally with basilar atelectasis. No  pneumothorax is identified. Electronically Signed   By: Inez Catalina M.D.   On: 11/01/2019 01:48   DG Chest Port 1 View  Result Date: 10/31/2019 CLINICAL DATA:  Status post left thoracentesis. EXAM: PORTABLE CHEST 1 VIEW COMPARISON:  October 31, 2019 FINDINGS: The left-sided pleural effusion is smaller after thoracentesis. No pneumothorax. Bilateral pleural effusions remain. The right Port-A-Cath is stable. No other interval changes. IMPRESSION: The left-sided pleural effusion is  smaller after thoracentesis. No pneumothorax. No other changes. Electronically Signed   By: Dorise Bullion III M.D   On: 10/31/2019 10:29   DG Chest Port 1 View  Result Date: 10/31/2019 CLINICAL DATA:  Follow-up pleural effusion EXAM: PORTABLE CHEST 1 VIEW COMPARISON:  10/30/2019 FINDINGS: Power port unchanged. Bilateral effusions, larger on the left than the right. Volume loss in the lower lungs, worse on the left than the right. No new radiographic finding. IMPRESSION: No change. Large bilateral effusions left more than right with dependent volume loss left more than right. Electronically Signed   By: Nelson Chimes M.D.   On: 10/31/2019 06:00   DG Chest Port 1 View  Result Date: 10/30/2019 CLINICAL DATA:  Respiratory failure. EXAM: PORTABLE CHEST 1 VIEW COMPARISON:  Chest radiograph 10/29/2019 FINDINGS: Right anterior chest wall Port-A-Cath is present with tip projecting over the superior vena cava. Monitoring leads overlie the patient. Stable enlarged cardiac and mediastinal contours. Persistent moderate to large layering bilateral pleural effusions with underlying pulmonary consolidation. No pneumothorax. IMPRESSION: Persistent moderate to large layering bilateral effusions with underlying consolidation. Electronically Signed   By: Lovey Newcomer M.D.   On: 10/30/2019 04:58   DG Chest Port 1 View  Result Date: 10/29/2019 CLINICAL DATA:  Shortness of breath EXAM: PORTABLE CHEST 1 VIEW COMPARISON:  09/01/2019, CT 08/25/2019 FINDINGS: Right-sided central venous port tip over the cavoatrial region allowing for rotated patient. Suspected moderate layering right pleural effusion, probably increased from prior radiograph. Moderate left pleural effusion, also suspect increase in size. Enlarged cardiomediastinal silhouette with vascular congestion. Consolidation at the left lung base. Fluid in the right fissure. Hazy appearance of both thoraces likely due to layering fluid although underlying pulmonary edema  could be contributing. No pneumothorax. IMPRESSION: Hazy appearance of the bilateral lungs since 09/01/2019, suspected to be secondary to moderate layering pleural effusions, probably increased in the interim. Cardiomegaly with vascular congestion; hazy lung appearance could also be due to associated edema. Atelectasis or pneumonia at the left base. Electronically Signed   By: Donavan Foil M.D.   On: 10/29/2019 15:19   VAS [place] LOWER EXTREMITY VENOUS (DVT)  Result Date: 11/09/2019  Lower Venous DVTStudy Indications: Swelling.  Limitations: Poor ultrasound/tissue interface. Comparison Study: 08/18/2019 - Negative for DVT. Performing Technologist: Oliver Hum RVT  Examination Guidelines: A complete evaluation includes B-mode imaging, spectral Doppler, color Doppler, and power Doppler as needed of all accessible portions of each vessel. Bilateral testing is considered an integral part of a complete examination. Limited examinations for reoccurring indications may be performed as noted. The reflux portion of the exam is performed with the patient in reverse Trendelenburg.  +---------+---------------+---------+-----------+----------+--------------+ RIGHT    CompressibilityPhasicitySpontaneityPropertiesThrombus Aging +---------+---------------+---------+-----------+----------+--------------+ CFV      Full           Yes      Yes                                 +---------+---------------+---------+-----------+----------+--------------+ SFJ  Full                                                        +---------+---------------+---------+-----------+----------+--------------+ FV Prox  Full                                                        +---------+---------------+---------+-----------+----------+--------------+ FV Mid   Full                                                        +---------+---------------+---------+-----------+----------+--------------+ FV DistalFull                                                         +---------+---------------+---------+-----------+----------+--------------+ PFV      Full                                                        +---------+---------------+---------+-----------+----------+--------------+ POP      Full           Yes      Yes                                 +---------+---------------+---------+-----------+----------+--------------+ PTV      Full                                                        +---------+---------------+---------+-----------+----------+--------------+ PERO     Full                                                        +---------+---------------+---------+-----------+----------+--------------+   +---------+---------------+---------+-----------+----------+--------------+ LEFT     CompressibilityPhasicitySpontaneityPropertiesThrombus Aging +---------+---------------+---------+-----------+----------+--------------+ CFV      Full           Yes      Yes                                 +---------+---------------+---------+-----------+----------+--------------+ SFJ      Full                                                        +---------+---------------+---------+-----------+----------+--------------+  FV Prox  Full                                                        +---------+---------------+---------+-----------+----------+--------------+ FV Mid   Full                                                        +---------+---------------+---------+-----------+----------+--------------+ FV DistalFull                                                        +---------+---------------+---------+-----------+----------+--------------+ PFV      Full                                                        +---------+---------------+---------+-----------+----------+--------------+ POP      None           No       No                   Acute           +---------+---------------+---------+-----------+----------+--------------+ PTV      Partial                                      Acute          +---------+---------------+---------+-----------+----------+--------------+ PERO     Partial                                      Acute          +---------+---------------+---------+-----------+----------+--------------+ Gastroc  Partial                                      Acute          +---------+---------------+---------+-----------+----------+--------------+     Summary: RIGHT: - There is no evidence of deep vein thrombosis in the lower extremity.  - No cystic structure found in the popliteal fossa. - Ultrasound characteristics of enlarged lymph nodes are noted in the groin.  LEFT: - Findings consistent with acute deep vein thrombosis involving the left popliteal vein, left posterior tibial veins, left peroneal veins, and left gastrocnemius veins. - No cystic structure found in the popliteal fossa.  *See table(s) above for measurements and observations. Electronically signed by Deitra Mayo MD on 11/09/2019 at 3:11:22 PM.    Final     ASSESSMENT AND PLAN: Mr. 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  The patient received 2 cycles of CHOP  with no improvement of his disease.  Additionally he had a significant hypersensitivity reaction to Rituxan on 2 occasions.  The patient was also tried on obinutuzumab and tolerated the first dose well but developed a hypersensitivity reaction and respiratory distress with the second cycle. He was started on treatment with acalabrutinib which he tolerated fairly well.  He has been off this medication since admission.  He has now developed worsening septic shock and increasing respiratory effort.  He is on increasing doses of pressors.  To compare has  had long discussions with the patient and they have also reached out to his sister.  He is now a DNR/DNI.  Agree with changing CODE STATUS.    The patient's chemotherapy has been placed on hold and that has been challenging treat him given his multiple anaphylactic reactions to medications.  The patient will be seen later today by Dr. Earlie Server with discussions regarding goals of care.  The patient may benefit from referral to hospice in the near future.   LOS: 4 days   Mikey Bussing, DNP, AGPCNP-BC, AOCNP 11/25/19   ADDENDUM: Hematology/Oncology Attending: I had a face to face encounter with the patient.  I agree with the above note.  This is a very pleasant 59 years old white male diagnosed with small lymphocytic lymphoma/CLL with bulky lymphadenopathy in the neck, chest, abdomen and pelvis as well as hepatosplenomegaly and bone marrow involvement.  The patient was tried on several chemotherapy regimens as well as Rituxan and obinutuzumab but he developed allergic reaction to this antibodies.  He is currently on treatment with acalabrutinib with no significant improvement in his condition.  He had bilateral pleural effusions status post bilateral Pleurx catheter placement.  The patient is currently admitted to the medical intensive care unit with significant hypotension as well as fatigue and weakness.  He is currently on pressor for blood pressure support.  His prognosis is very poor taking into consideration the failure of the previous treatment and the deterioration of his general condition. I had a lengthy discussion with the patient today about his current condition and recommended for him to consider palliative care at this point.  The patient would like to think about this option and to talk to his sister first.  In the meantime we will continue with the supportive care for now. Thank you for taking good care of Mr. Manuele, I will continue to follow up the patient with you and assist in his  management on as-needed basis.  Disclaimer: This note was dictated with voice recognition software. Similar sounding words can inadvertently be transcribed and may be missed upon review. Eilleen Kempf, MD

## 2019-11-25 NOTE — Progress Notes (Signed)
Hypoglycemic Event  CBG: 31  Treatment: 1 amp of Dextrose 50% given   Symptoms: Asymptomatic   Follow-up CBG: Time: 0722 CBG Result: 74  Possible Reasons for Event: Low oral intake  Comments/MD notified: Yes, Vilinda Blanks

## 2019-11-25 NOTE — Progress Notes (Addendum)
PCCM Interval Progress Note Update 10:44  Revisited below conversation. Patient wishes to continue current interventions but in even for respiratory decline will not pursue intubation and in event of arrest, no CPR/DF/CV.  I clarify this decision, summarizing- continue current medicines including BP supporting medicine, but no life support breathing machine if breathing worsens and no CPR if despite these interventions your heart stops. Patient articulates understanding.  P -Update code status to DNR/DNI -Continue current scope of intervention, including pressors, but no intubation if respiratory decline  ______________________________  Called to the bedside to assess pt for worsening shock and increasing respiratory effort.  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 Septic shock. Recurring malignant pleural effusions. -pt changed from neo to NE this morning, SBP maintain < 90 following this change despite uptitration of NE  Upon entering room and asking patient how he is feeling patient states "Don't ask me. Just tell me, just say it." I said I am concerned he is dying.  We discuss code status and goals of care. I state that I am worried that measures such as intubation would not be beneficial. We discuss continuing current level of care, with no intubation in event of respiratory failure and no CPR,CF/CV in event of cardiac arrest-- with transition to comfort care if further decline. We also discuss intentional transition to comfort care with DNR measures.   Patient requests a few minutes to think. I will circle back.   Additional critical care time: 23 minutes  Eliseo Gum MSN, AGACNP-BC [place] [id number] If no answer, [id number] 11/25/2019, 10:37 AM

## 2019-11-27 LAB — BODY FLUID CULTURE

## 2019-11-29 NOTE — Progress Notes (Signed)
I contacted [name] due to increased patient distress, At this time patient remained alert and oriented and still did not want me assisting him with additional care. IV morphine was prescribed prn, with no new orders to increase levo drip if required.

## 2019-11-29 NOTE — Progress Notes (Signed)
[facility] Progress Note Patient Name: Sukhjit Fluckiger DOB: September 20, 1961 MRN: [id number]   Date of Service  2019/12/09  HPI/Events of Note  Pt with terminal decline in blood pressure and oxygen saturation.  eICU Interventions  Pt's sister [name] notified to come to the hospital for end of life situation if she chooses, she is on her way.        Kerry Kass Ogan 12/09/2019, 12:32 AM

## 2019-11-29 NOTE — Progress Notes (Signed)
Patient passed away at [place]. Sister was notified of patient decline, and asked to come to hospital if she was able. Patient belongings were sent home with sister [name]. This included clothing, a cell phone, and glasses.

## 2019-11-29 DEATH — deceased

## 2019-11-30 LAB — CULTURE, BLOOD (ROUTINE X 2)
Culture: NO GROWTH
Special Requests: ADEQUATE

## 2019-12-01 LAB — CHOLESTEROL, BODY FLUID: Cholesterol, Fluid: 28 mg/dL

## 2019-12-02 ENCOUNTER — Ambulatory Visit: Payer: Medicaid Other | Admitting: Physician Assistant

## 2019-12-02 ENCOUNTER — Other Ambulatory Visit: Payer: Medicaid Other

## 2019-12-07 ENCOUNTER — Inpatient Hospital Stay: Payer: Medicaid Other | Admitting: Critical Care Medicine

## 2019-12-22 LAB — FUNGUS CULTURE RESULT

## 2019-12-22 LAB — FUNGUS CULTURE WITH STAIN

## 2019-12-22 LAB — FUNGAL ORGANISM REFLEX

## 2019-12-30 NOTE — Discharge Summary (Signed)
Physician Discharge Summary         Patient ID: Isaac Pierce MRN: [id number] DOB/AGE: [birth year] 59 y.o.  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 2) Acute respiratory failure secondary to effusions with possible HCAP 3) severe protein calorie malnutrition assoc with very low albumin with tendency to 3rd spacing  4) Terminal septic shock unidentified source, refractory to abx and pressors.   Discharge summary   59 Y/O with CLL/small cell B lymphoma, With admission from 1/29-2/12 with allergic reaction to [place]. During that admission he had bilateral recurrent effusions rx  bilateral Pleurx catheters (placed on 2/9) and new diagnosis of L DVT (discharged with eliquis)  Admitted back progressive dyspnea x 4 days PTA and febrile in ED 100.4 with lactic acidosis and hemoglobin of 6.6.  He had been draining his catheter every other day- on the right side with 1 L drainage on 2/20 and left side was drained on 2/19 and  due to be drained again. He was treated aggressively for possible HCAP and effusions were drained as need for his level of air hunger but this only seemed to make his bp lower and more pressor dependent.  He was seen by [facility] with nothing more to offer to treat the underlying problem and expired with comfort measures in place as full NCB.     Signed: Christinia Gully 12/07/2019, 6:42 AM

## 2020-11-18 IMAGING — DX DG CHEST 1V PORT
1 series · 1 of 1 positions shown · non-contrast
Comparison: 09/01/2019, CT 08/25/2019

CLINICAL DATA: Shortness of breath

EXAM:
PORTABLE CHEST 1 VIEW

[chest ap]
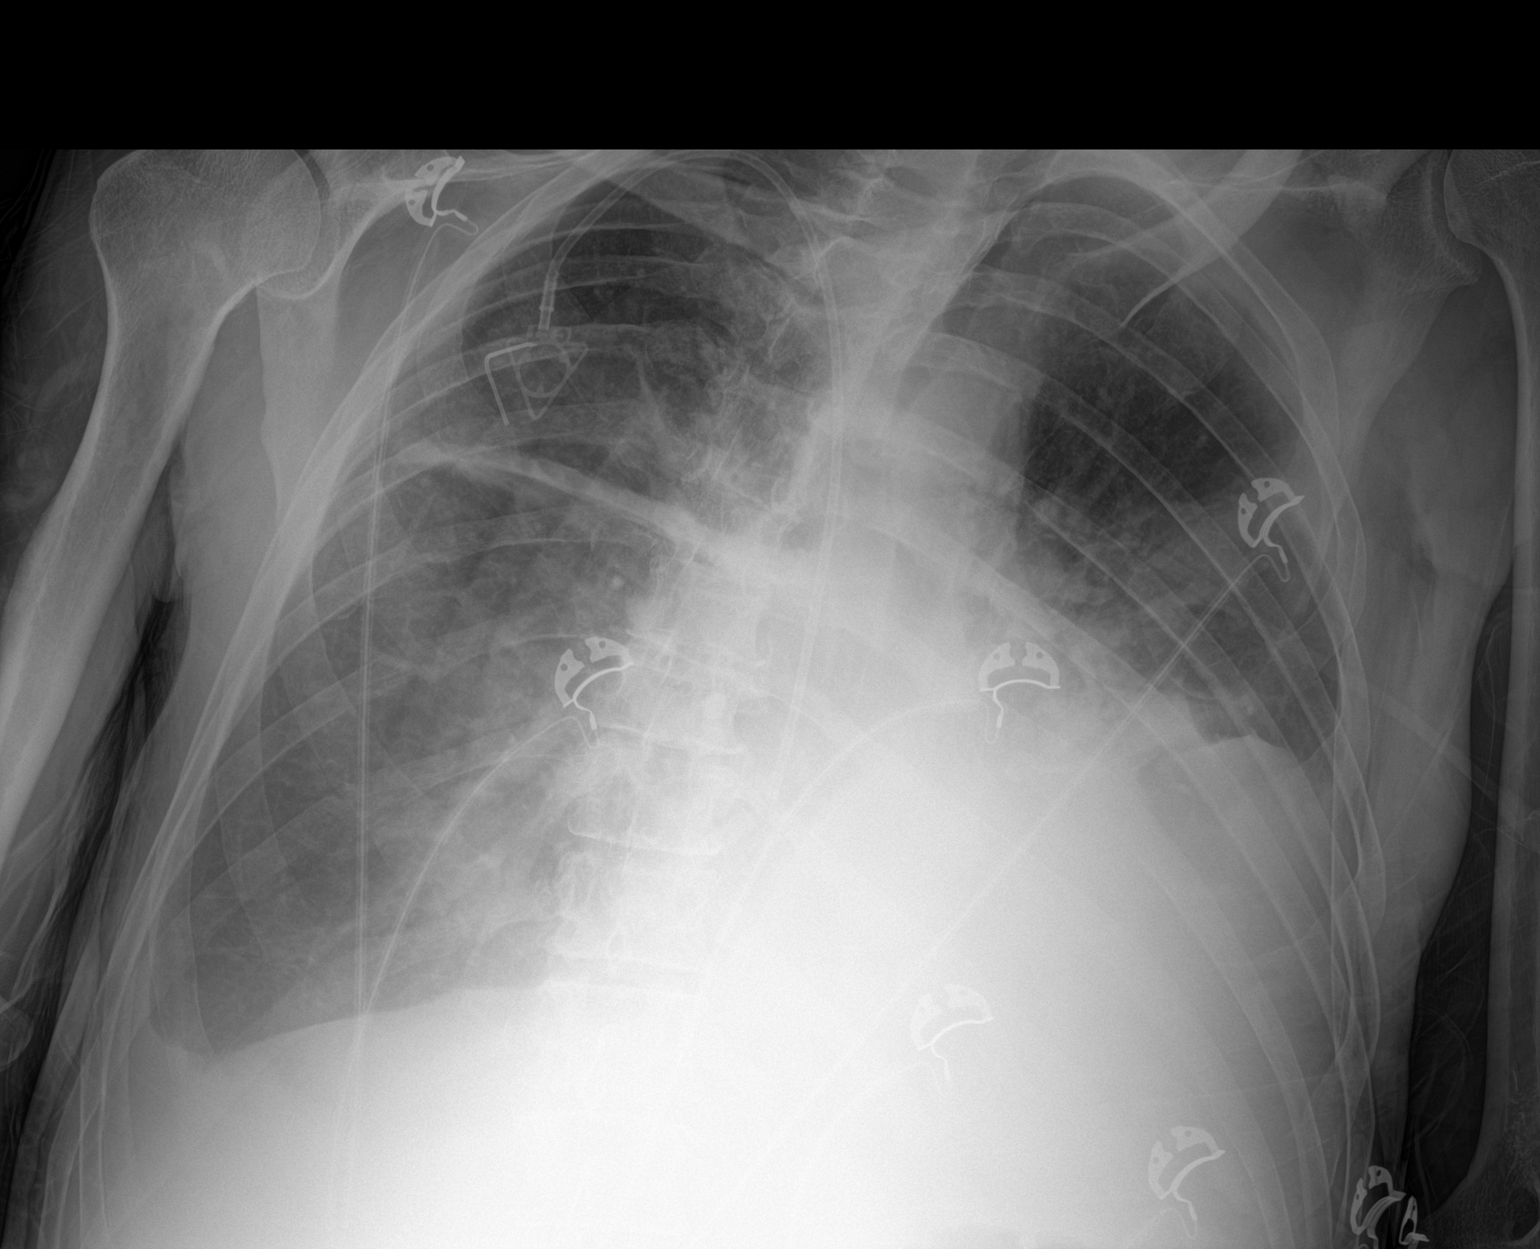

[1 of 1 positions shown; findings below may reference images not displayed]

FINDINGS: Right-sided central venous port tip over the cavoatrial region
allowing for rotated patient. Suspected moderate layering right
pleural effusion, probably increased from prior radiograph. Moderate
left pleural effusion, also suspect increase in size. Enlarged
cardiomediastinal silhouette with vascular congestion. Consolidation
at the left lung base. Fluid in the right fissure. Hazy appearance
of both thoraces likely due to layering fluid although underlying
pulmonary edema could be contributing. No pneumothorax.
IMPRESSION: Hazy appearance of the bilateral lungs since 09/01/2019, suspected
to be secondary to moderate layering pleural effusions, probably
increased in the interim. Cardiomegaly with vascular congestion;
hazy lung appearance could also be due to associated edema.
Atelectasis or pneumonia at the left base.

## 2020-11-19 IMAGING — DX DG CHEST 1V PORT
2 series · 2 of 2 positions shown · non-contrast
Comparison: Chest radiograph 10/29/2019

CLINICAL DATA: Respiratory failure.

EXAM:
PORTABLE CHEST 1 VIEW

[chest ap (1 of 2)]
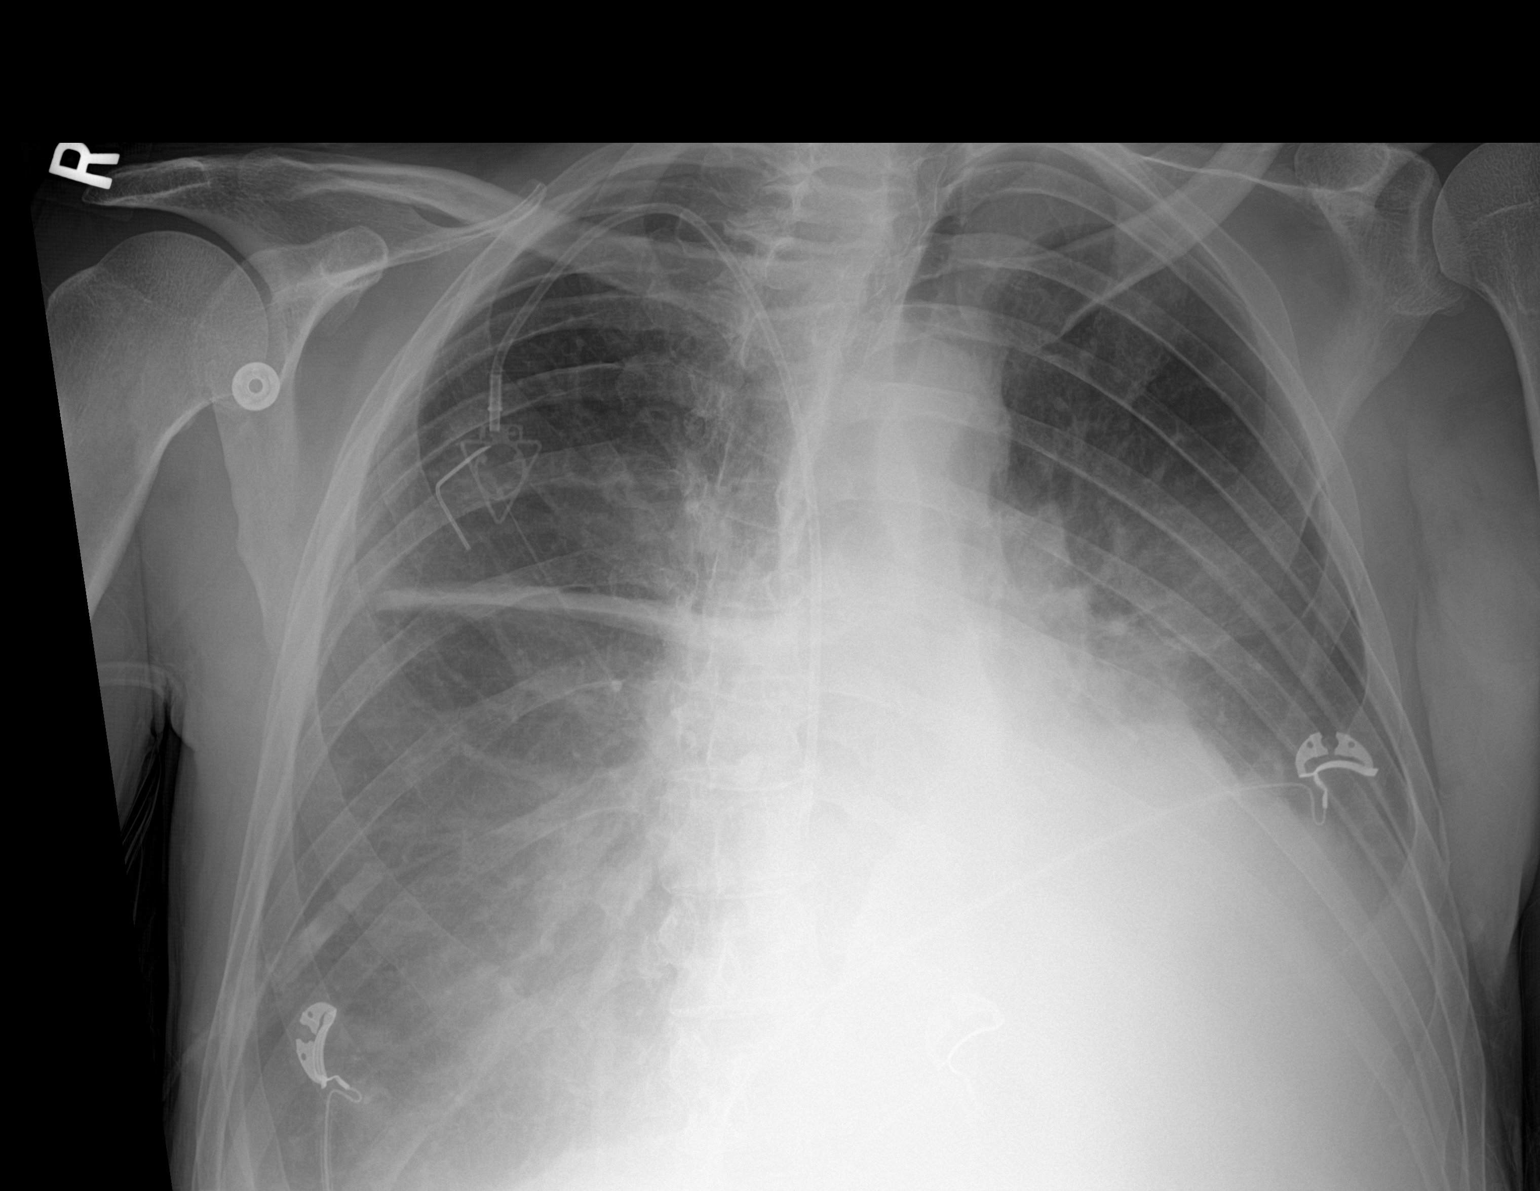

[chest ap (2 of 2)]
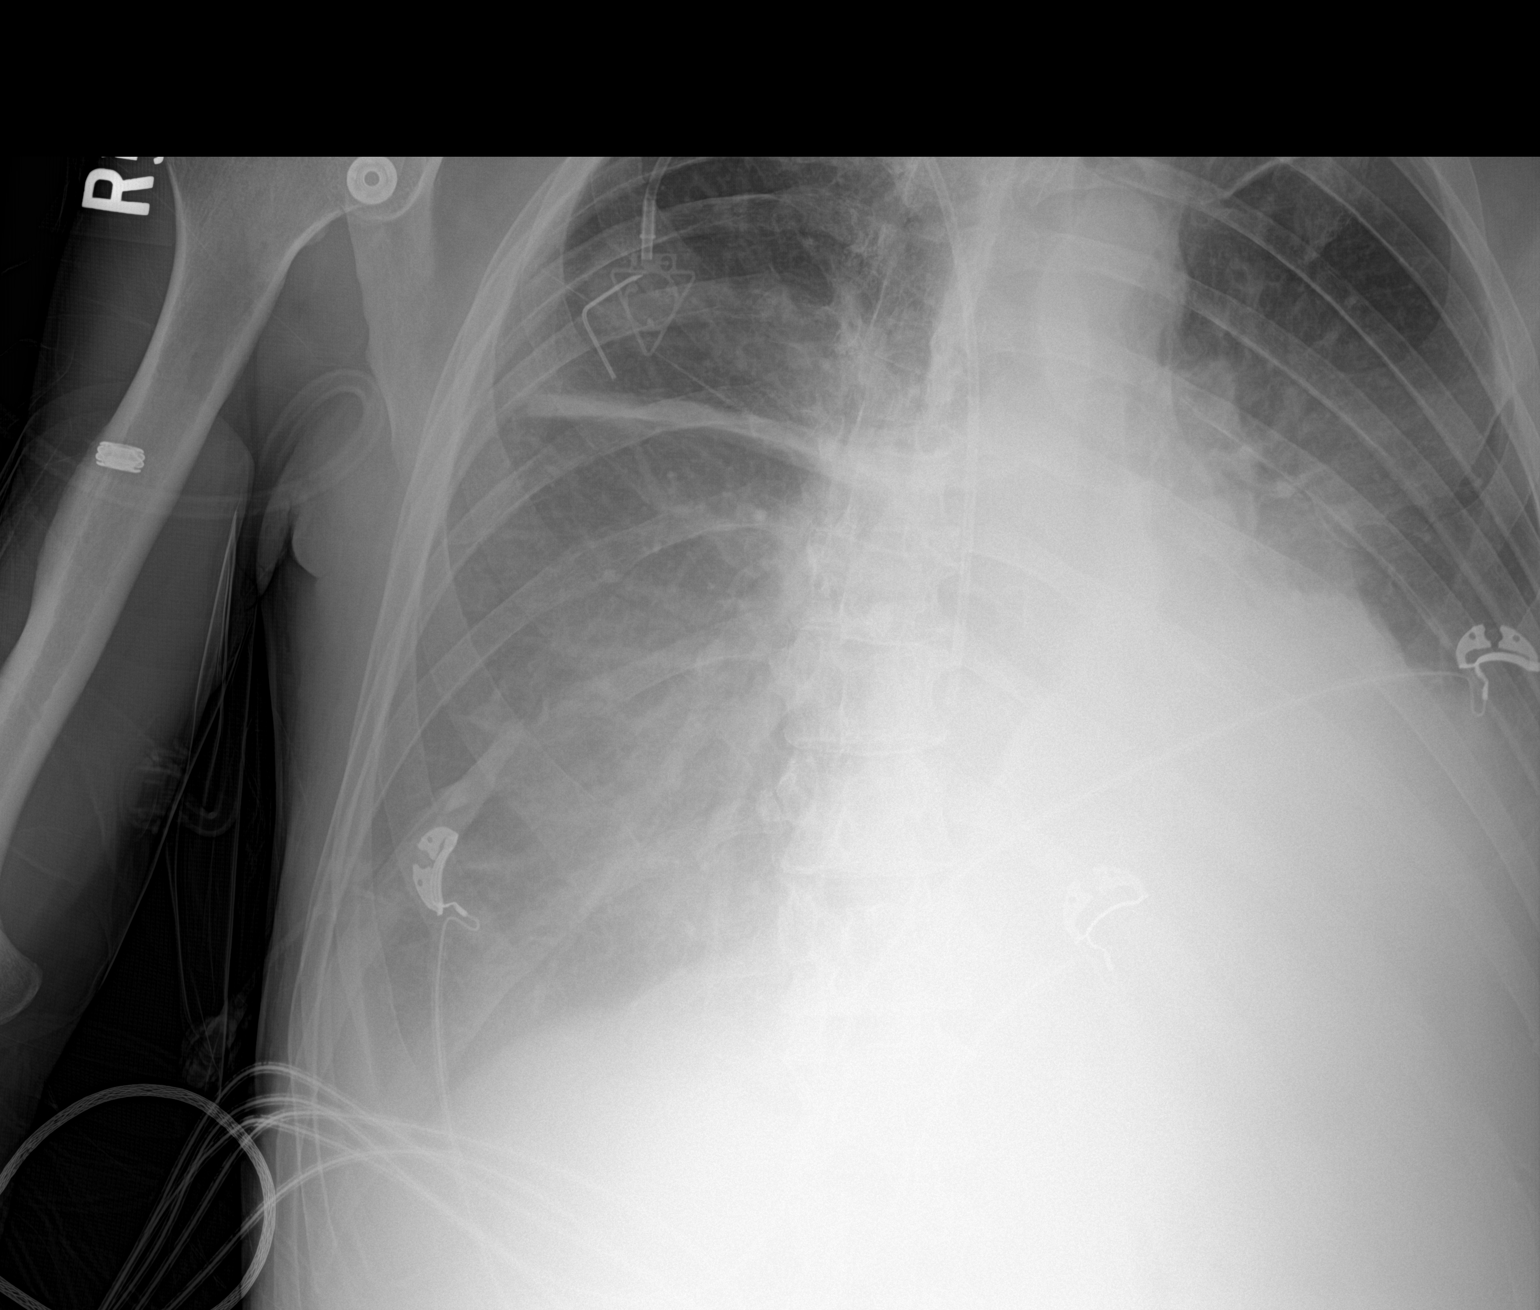

[2 of 2 positions shown; findings below may reference images not displayed]

FINDINGS: Right anterior chest wall Port-A-Cath is present with tip projecting
over the superior vena cava. Monitoring leads overlie the patient.
Stable enlarged cardiac and mediastinal contours. Persistent
moderate to large layering bilateral pleural effusions with
underlying pulmonary consolidation. No pneumothorax.
IMPRESSION: Persistent moderate to large layering bilateral effusions with
underlying consolidation.

## 2020-11-20 IMAGING — DX DG CHEST 1V PORT
1 series · 1 of 1 positions shown · non-contrast
Comparison: 10/30/2019

CLINICAL DATA: Follow-up pleural effusion

EXAM:
PORTABLE CHEST 1 VIEW

[chest ap]
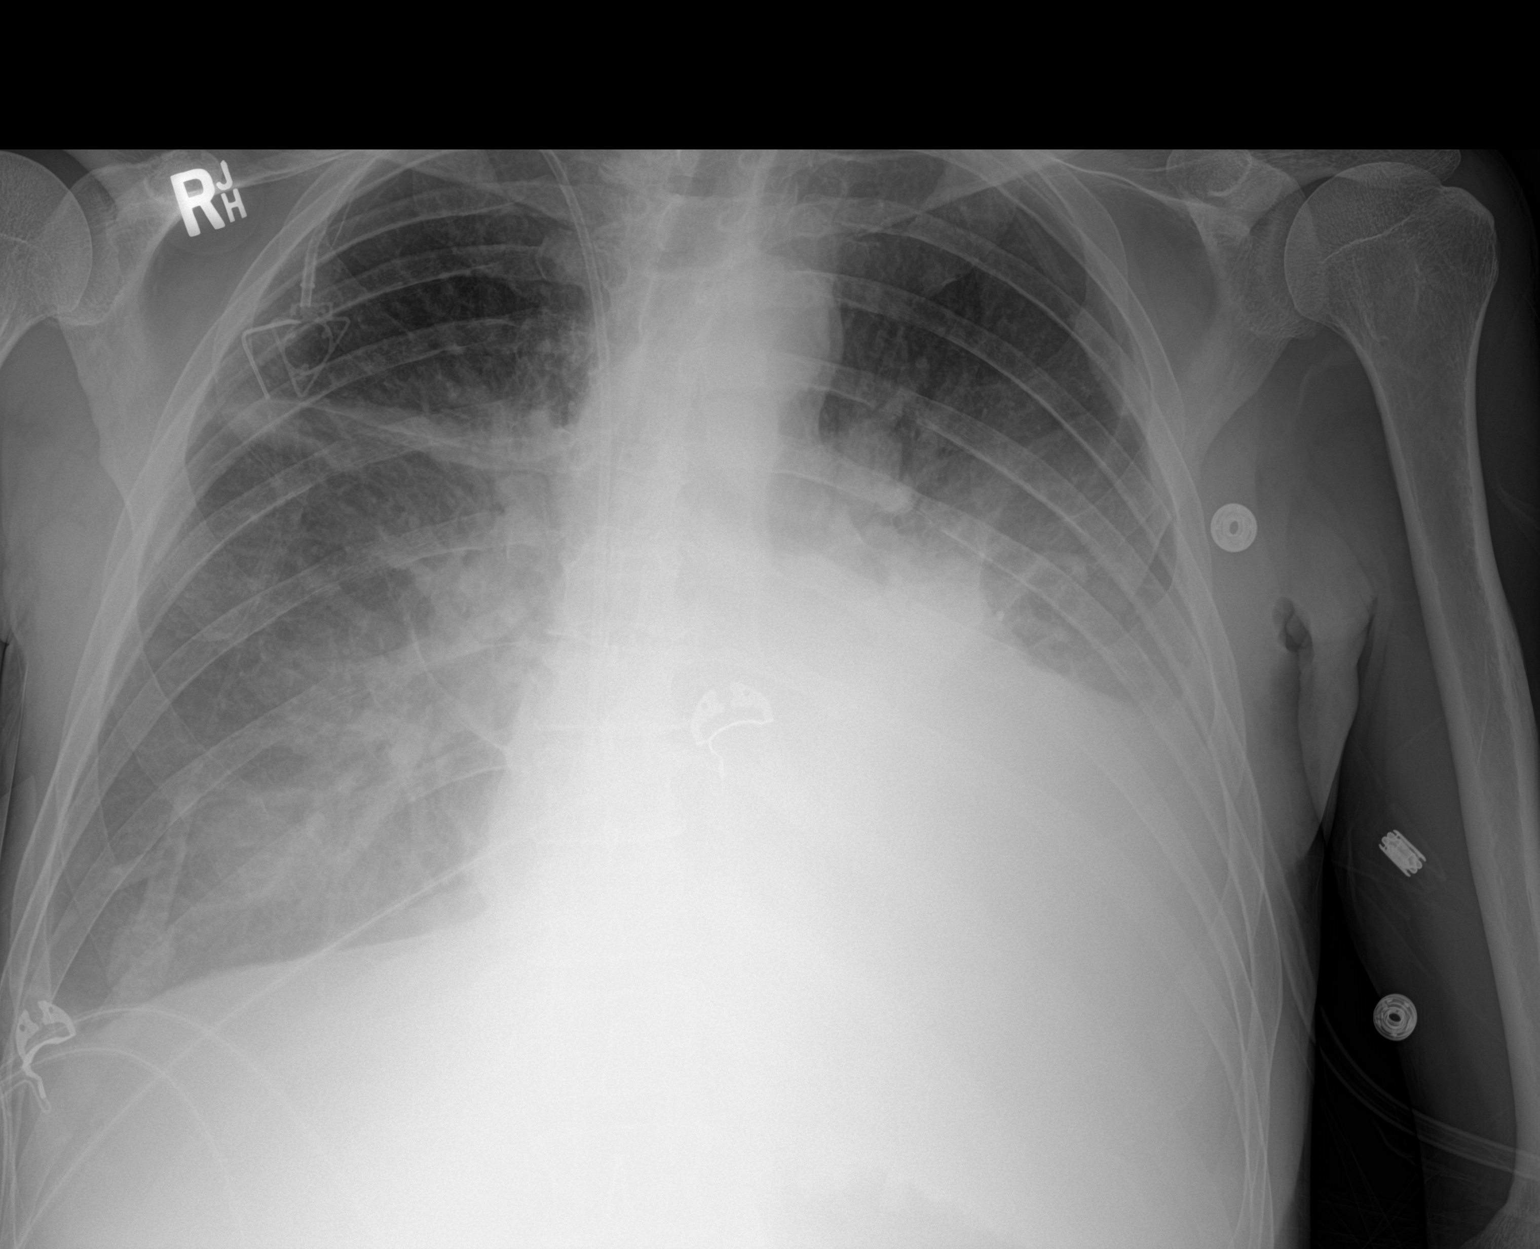

[1 of 1 positions shown; findings below may reference images not displayed]

FINDINGS: Power port unchanged. Bilateral effusions, larger on the left than
the right. Volume loss in the lower lungs, worse on the left than
the right. No new radiographic finding.
IMPRESSION: No change. Large bilateral effusions left more than right with
dependent volume loss left more than right.

## 2020-11-21 IMAGING — DX DG CHEST 1V PORT
1 series · 1 of 1 positions shown · non-contrast
Comparison: 10/31/19

CLINICAL DATA: Status post thoracentesis

EXAM:
PORTABLE CHEST 1 VIEW

[chest ap]
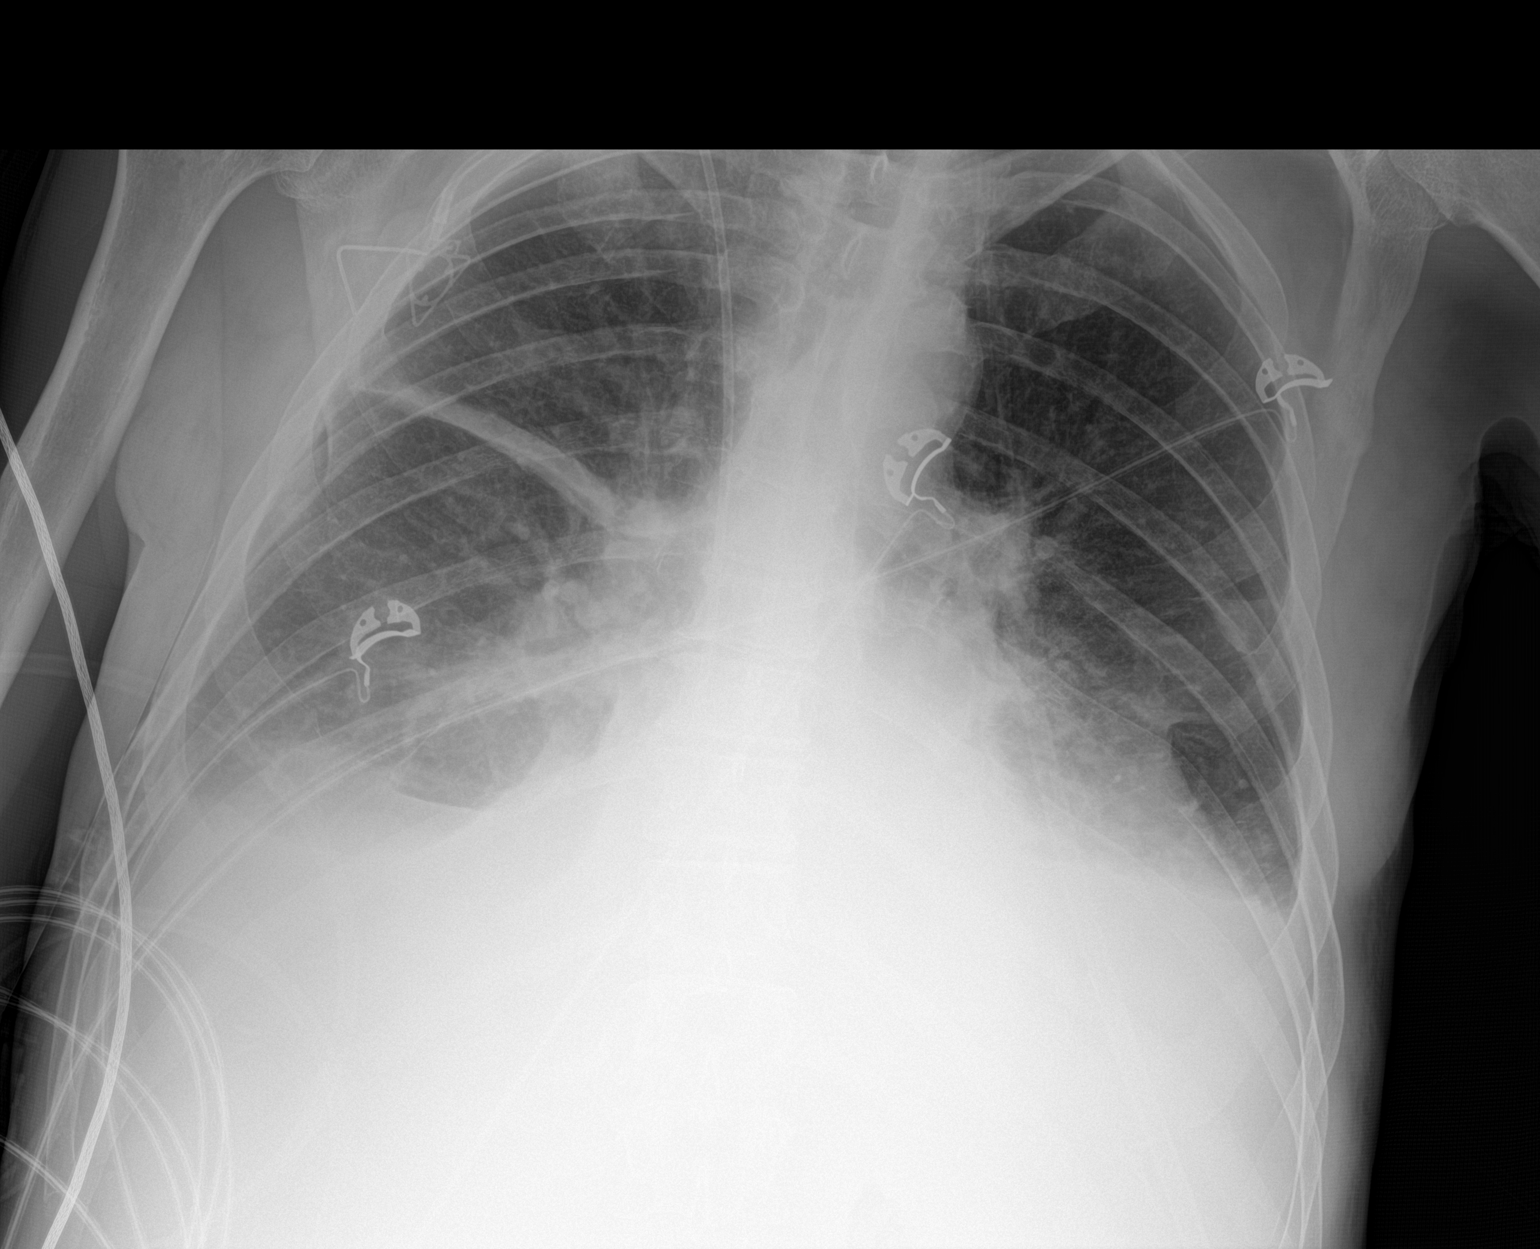

[1 of 1 positions shown; findings below may reference images not displayed]

FINDINGS: Cardiac shadow is stable. Right chest wall port is noted. Small
pleural effusions are noted bilaterally with basilar atelectasis. No
pneumothorax is seen.
IMPRESSION: Small pleural effusions bilaterally with basilar atelectasis. No
pneumothorax is identified.

## 2023-08-20 NOTE — Telephone Encounter (Signed)
Telephone call
